# Patient Record
Sex: Female | Born: 1950 | Race: White | Hispanic: No | Smoking: Current every day smoker
Health system: Southern US, Academic
[De-identification: ages and names within clinical notes are randomized; demographics above are authoritative.]

## PROBLEM LIST (undated history)

## (undated) DIAGNOSIS — I509 Heart failure, unspecified: Secondary | ICD-10-CM

## (undated) DIAGNOSIS — S22060A Wedge compression fracture of T7-T8 vertebra, initial encounter for closed fracture: Secondary | ICD-10-CM

## (undated) DIAGNOSIS — K219 Gastro-esophageal reflux disease without esophagitis: Secondary | ICD-10-CM

## (undated) DIAGNOSIS — F329 Major depressive disorder, single episode, unspecified: Secondary | ICD-10-CM

## (undated) DIAGNOSIS — J449 Chronic obstructive pulmonary disease, unspecified: Secondary | ICD-10-CM

## (undated) DIAGNOSIS — I517 Cardiomegaly: Secondary | ICD-10-CM

## (undated) DIAGNOSIS — J9611 Chronic respiratory failure with hypoxia: Secondary | ICD-10-CM

## (undated) DIAGNOSIS — I2541 Coronary artery aneurysm: Secondary | ICD-10-CM

## (undated) DIAGNOSIS — I219 Acute myocardial infarction, unspecified: Secondary | ICD-10-CM

## (undated) DIAGNOSIS — I2510 Atherosclerotic heart disease of native coronary artery without angina pectoris: Secondary | ICD-10-CM

## (undated) HISTORY — DX: Acute myocardial infarction, unspecified: I21.9

## (undated) HISTORY — DX: Gastro-esophageal reflux disease without esophagitis: K21.9

## (undated) HISTORY — PX: NO PAST SURGERIES: SHX2092

---

## 1995-03-31 ENCOUNTER — Other Ambulatory Visit (HOSPITAL_COMMUNITY): Payer: Self-pay

## 2020-08-06 IMAGING — MR MRI JOINT OF LOWER EXTREMITY WITHOUT AND WITH CONTRAST RT
6 of 7 series · 32 of 40 positions shown · IV contrast (gadavist)
Comparison: None available.

﻿EXAM:  MRI JOINT OF LOWER EXTREMITY WITHOUT AND WITH CONTRAST RT
INDICATION: Chronic right hip pain.
TECHNIQUE: Multiplanar multisequential MRI of the right hip joint was performed following intra-articular administration of 15 mL of diluted Gadavist.

[Series 7: T1 · axial · right · 5.0mm · 0.74mm/px · z∈[-76,+83]mm · 6 of 30 slices shown (1 of 2)]
[im 1/30]
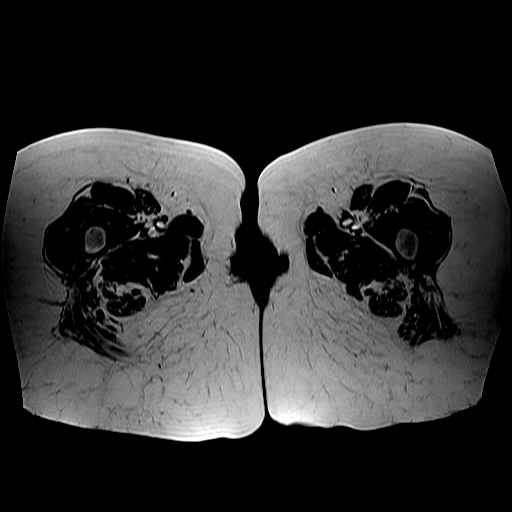
[im 6/30]
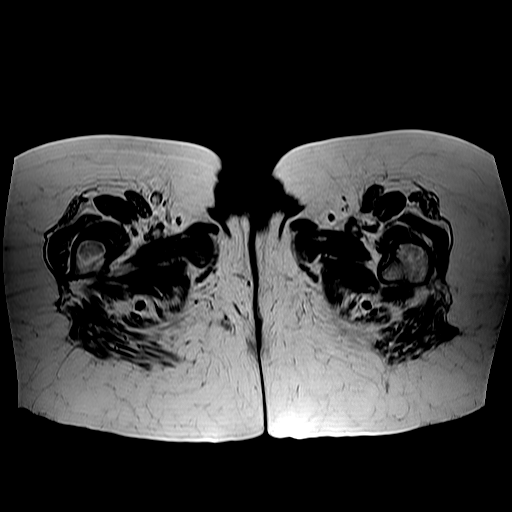
[im 12/30]
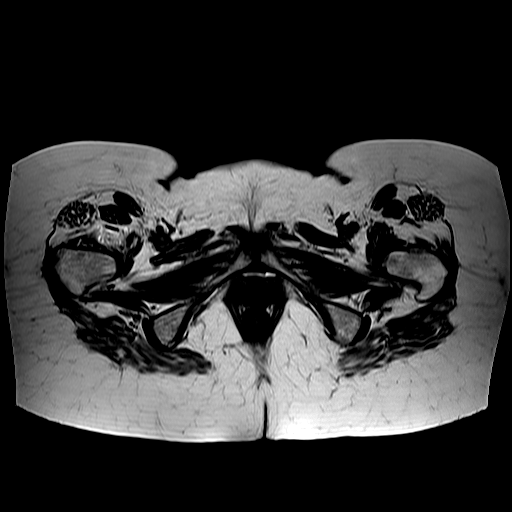
[im 18/30]
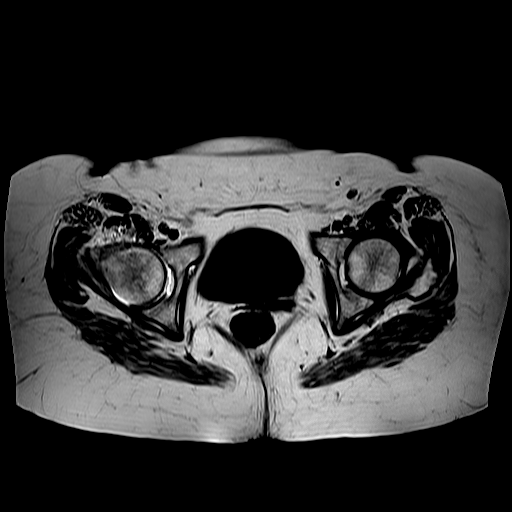
[im 24/30]
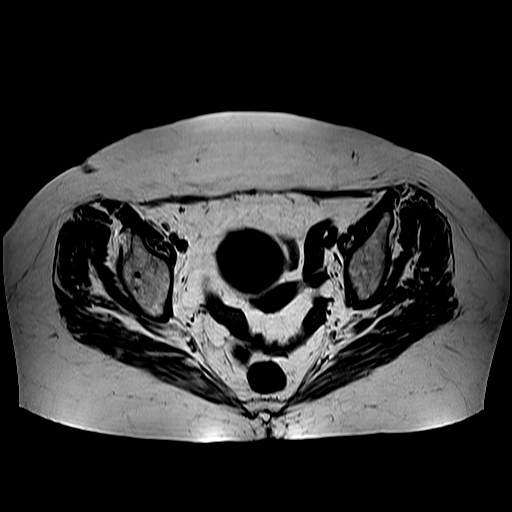
[im 30/30]
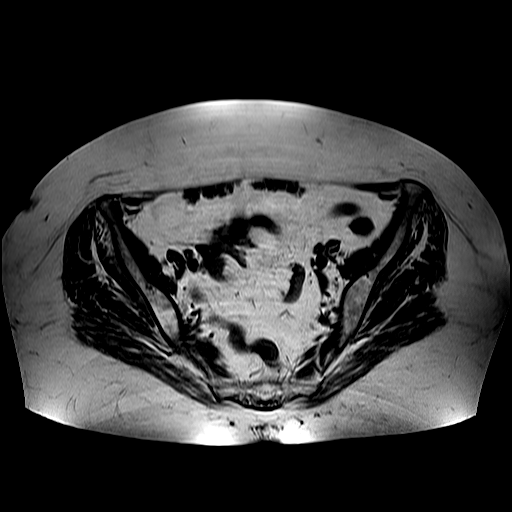

[Series 8: T1 fat-sat · axial · right · 5.0mm · 1.19mm/px · z∈[-76,+83]mm · 7 of 30 slices shown]
[im 1/30]
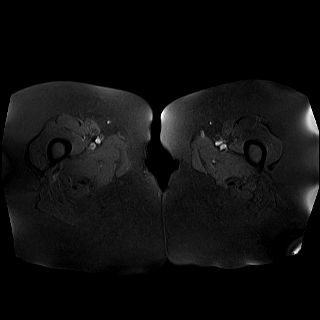
[im 5/30]
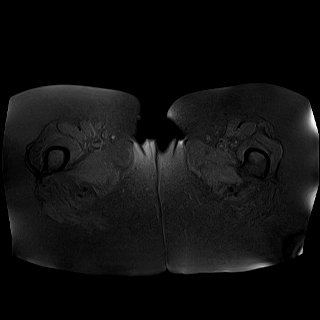
[im 10/30]
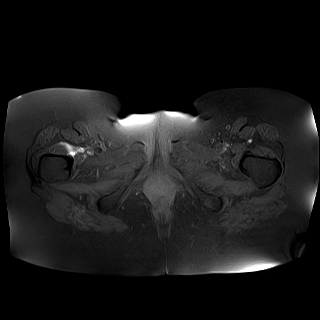
[im 15/30]
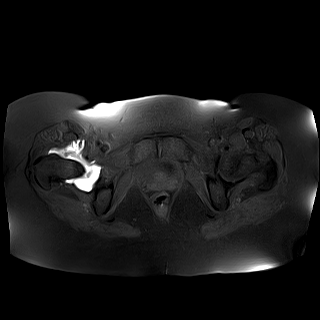
[im 20/30]
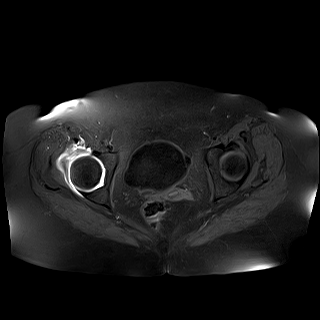
[im 25/30]
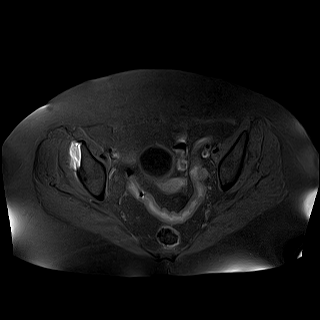
[im 30/30]
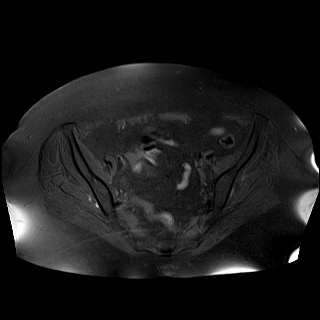

[Series 9: T2 fat-sat · axial · right · 5.0mm · 0.85mm/px · z∈[-76,+83]mm · 7 of 30 slices shown]
[im 1/30]
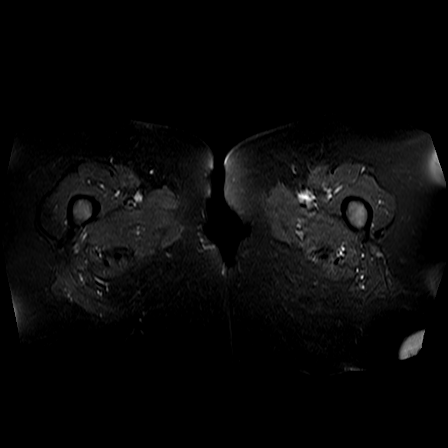
[im 5/30]
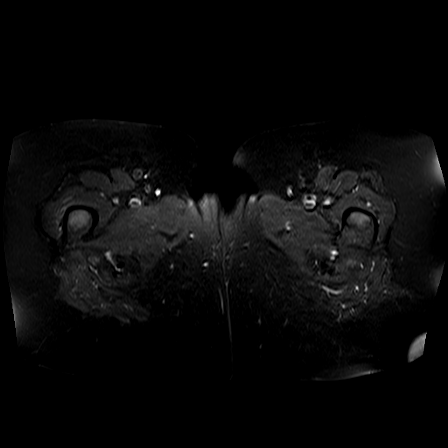
[im 10/30]
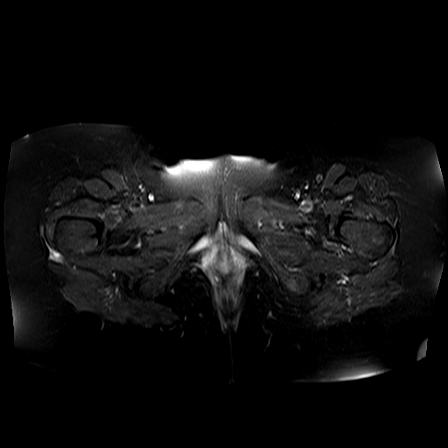
[im 15/30]
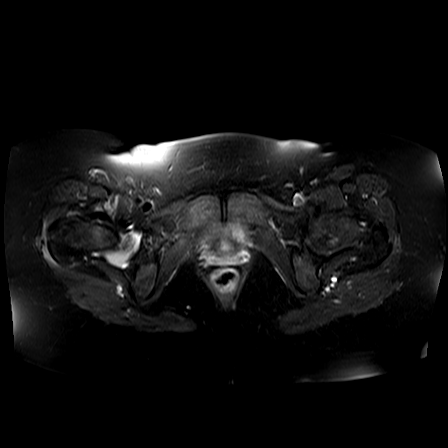
[im 20/30]
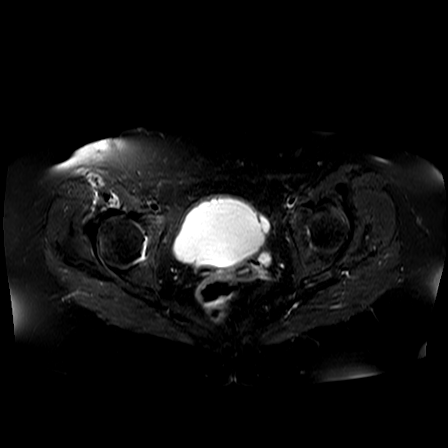
[im 25/30]
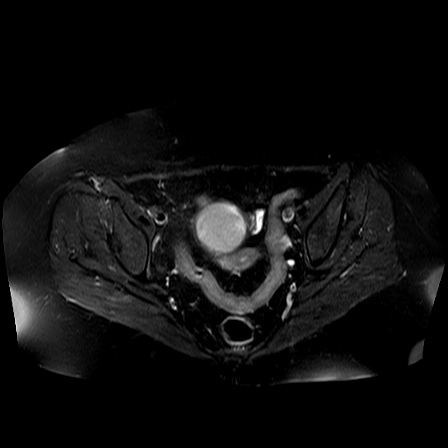
[im 30/30]
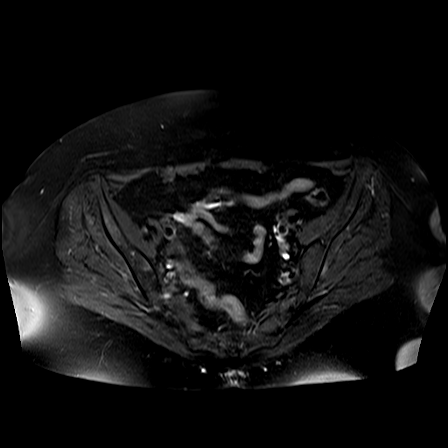

[Series 10: T1 · coronal · right · 4.0mm · 0.74mm/px · 2 of 20 slices shown (2 of 2)]
[im 1/20]
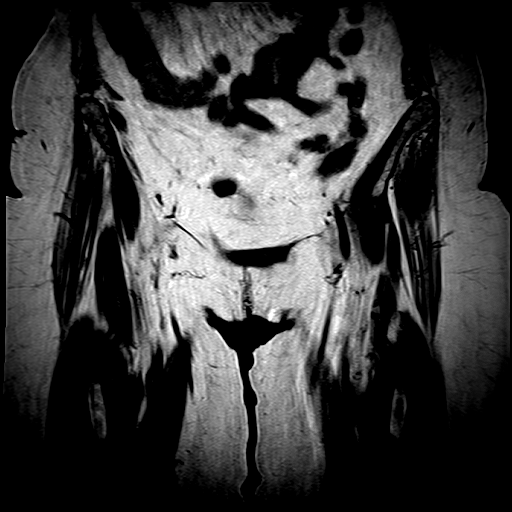
[im 5/20]
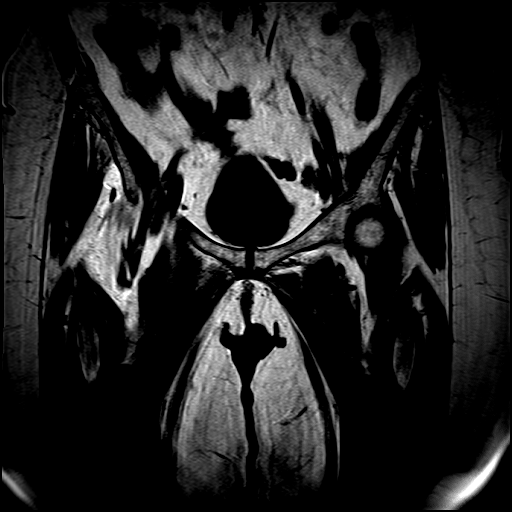

[Series 12: PD fat-sat · axial · right · 4.5mm · 0.70mm/px · z∈[-32,+63]mm · 5 of 20 slices shown (1 of 2)]
[im 1/20]
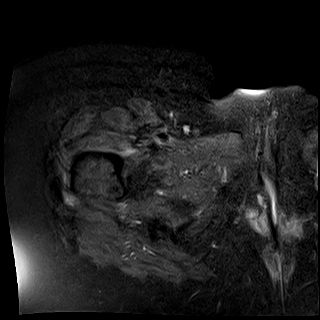
[im 5/20]
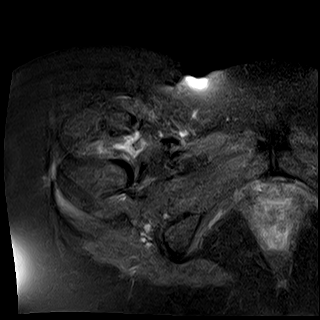
[im 10/20]
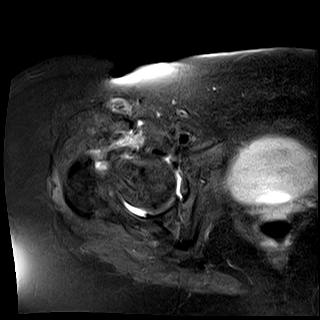
[im 15/20]
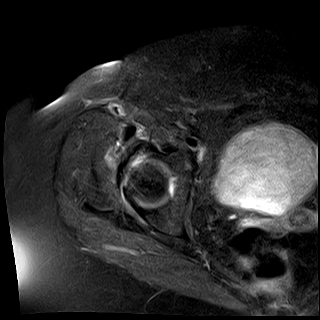
[im 20/20]
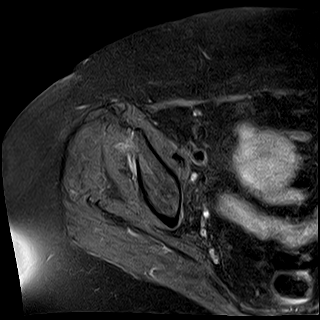

[Series 13: PD fat-sat · coronal · right · 4.0mm · 0.78mm/px · 5 of 20 slices shown (2 of 2)]
[im 1/20]
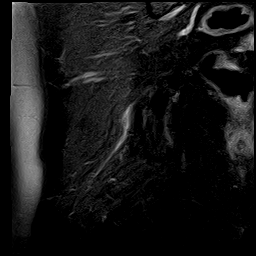
[im 5/20]
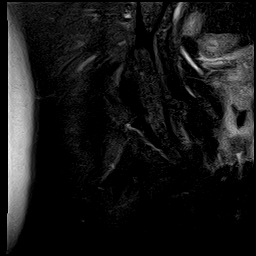
[im 10/20]
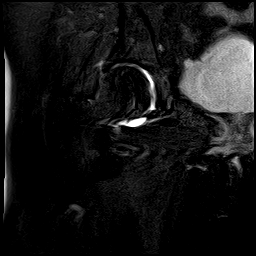
[im 15/20]
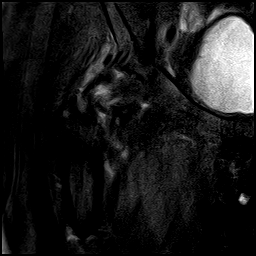
[im 20/20]
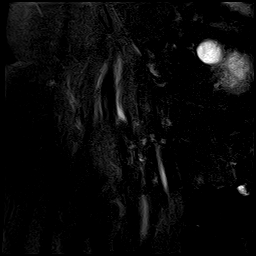

[32 of 40 positions shown; findings below may reference images not displayed]

FINDINGS: Examination is degraded due to excessive patient motion. Bone marrow signal intensity is normal. There is no acute fracture or subluxation. Articular cartilage appears well maintained. No definite labral tear is identified. There is moderate right greater trochanteric bursitis. Muscles and soft tissues about the pelvic girdle appear unremarkable. Evaluation of pelvic parenchymal structures is limited without definite abnormality. Small urinary bladder diverticuli are incidentally noted.
IMPRESSION: 1. Degraded exam due to excessive patient motion. Grossly unremarkable right hip articular cartilage and labrum. 

2. Moderate right greater trochanteric bursitis.

## 2020-12-29 IMAGING — MR MRI LUMBAR SPINE WITHOUT CONTRAST
6 series · 44 of 48 positions shown · IV contrast (gadolinium)
Comparison: None available.

﻿EXAM:  [id number]   MRI LUMBAR SPINE WITHOUT CONTRAST
INDICATION: Back pain.
TECHNIQUE: Multiplanar multisequential MRI of the lumbar spine was performed without gadolinium contrast.

[Series 5: T2 · sagittal · 4.0mm · 1.02mm/px · 5 of 13 slices shown (1 of 3)]
[im 1/13]
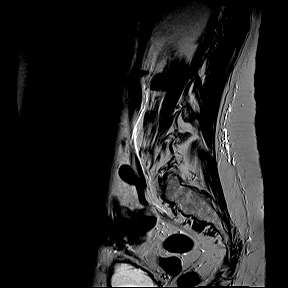
[im 4/13]
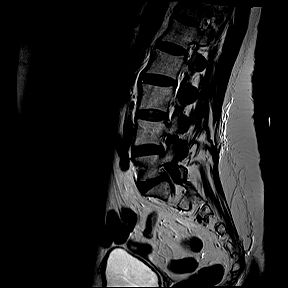
[im 7/13]
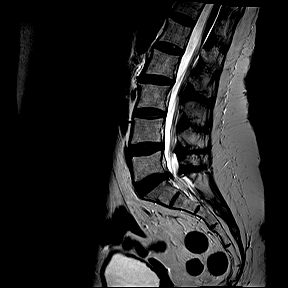
[im 10/13]
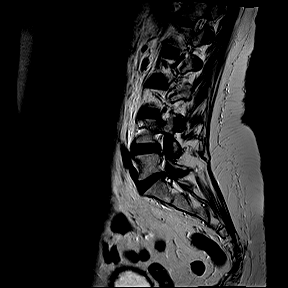
[im 13/13]
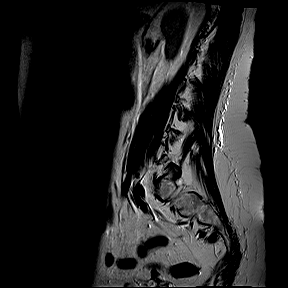

[Series 6: T1 · sagittal · 4.0mm · 1.02mm/px · 6 of 13 slices shown (1 of 2)]
[im 1/13]
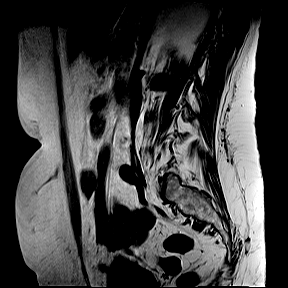
[im 3/13]
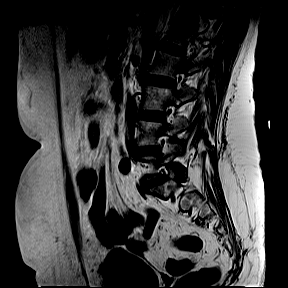
[im 5/13]
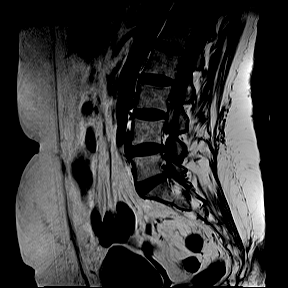
[im 8/13]
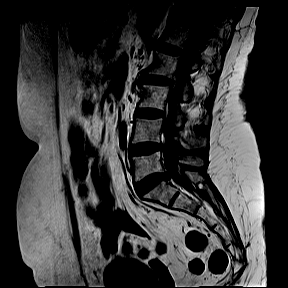
[im 10/13]
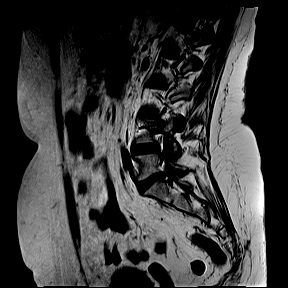
[im 13/13]
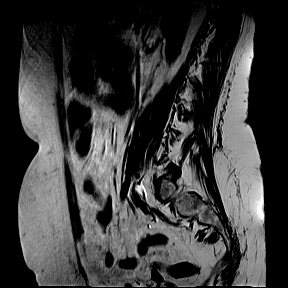

[Series 7: STIR · sagittal · 4.0mm · 1.15mm/px · 5 of 13 slices shown]
[im 1/13]
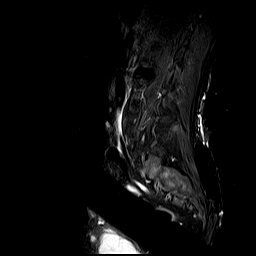
[im 3/13]
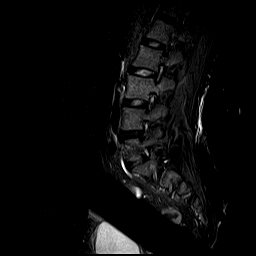
[im 5/13]
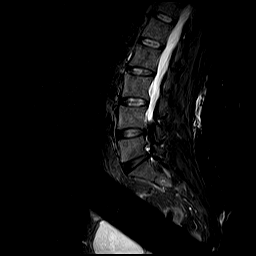
[im 8/13]
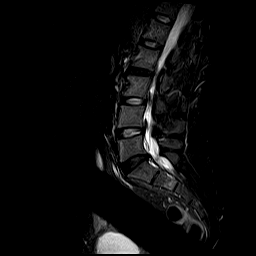
[im 10/13]
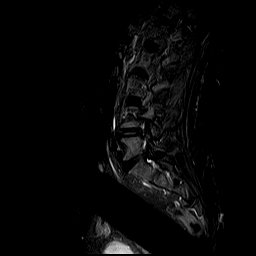

[Series 8: T2 · axial · 4.0mm · 0.47mm/px · z∈[-48,+144]mm · 11 of 23 slices shown (2 of 3)]
[im 1/23]
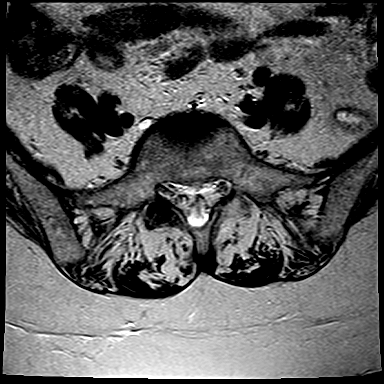
[im 3/23]
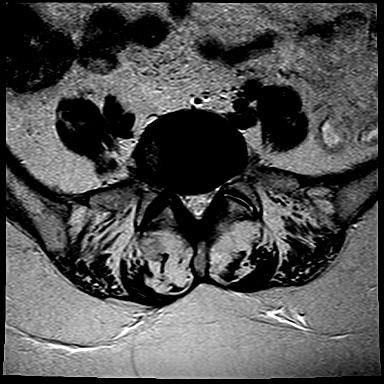
[im 5/23]
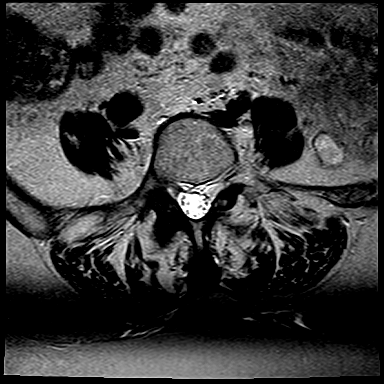
[im 7/23]
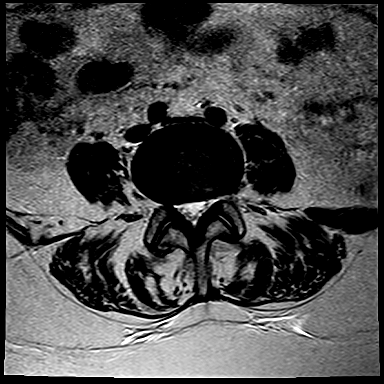
[im 9/23]
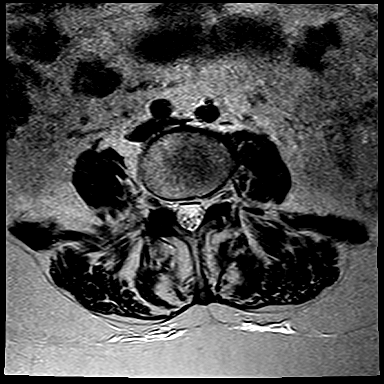
[im 12/23]
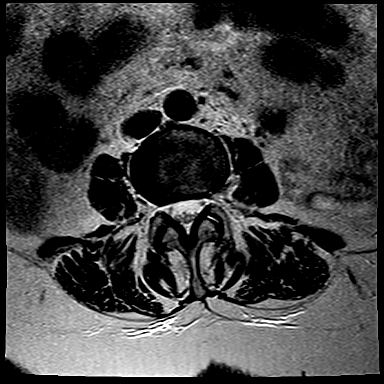
[im 14/23]
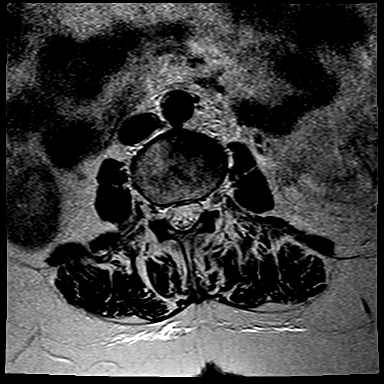
[im 16/23]
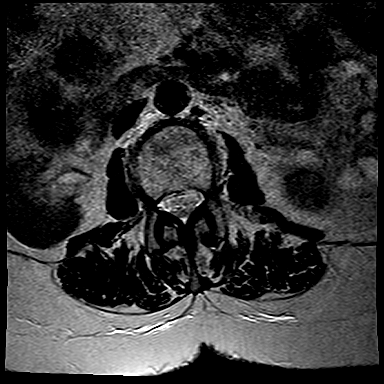
[im 18/23]
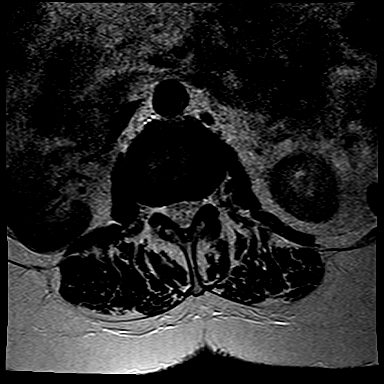
[im 20/23]
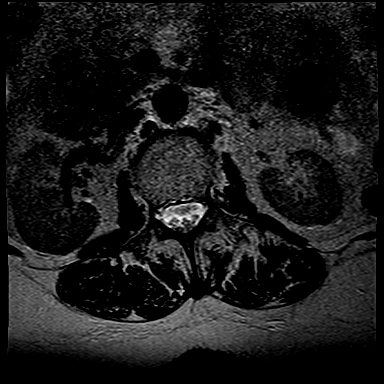
[im 23/23]
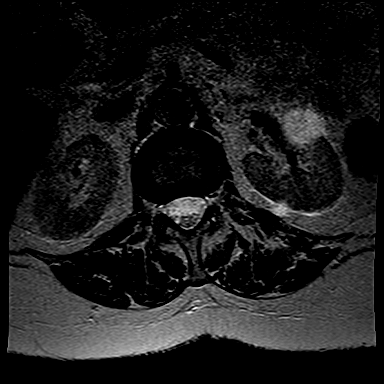

[Series 9: T1 · axial · 4.0mm · 0.47mm/px · z∈[-48,+144]mm · 8 of 23 slices shown (2 of 2)]
[im 1/23]
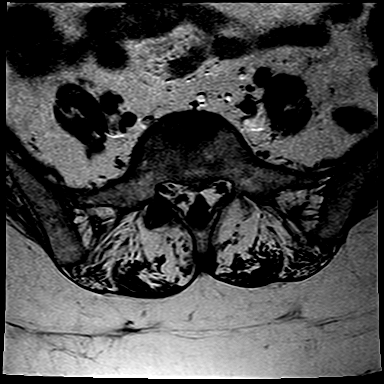
[im 5/23]
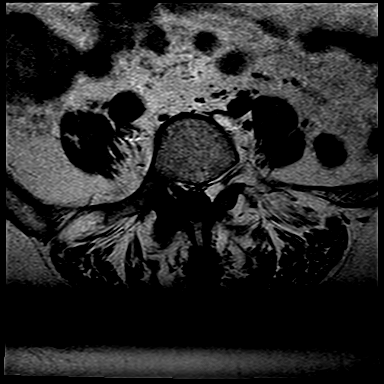
[im 7/23]
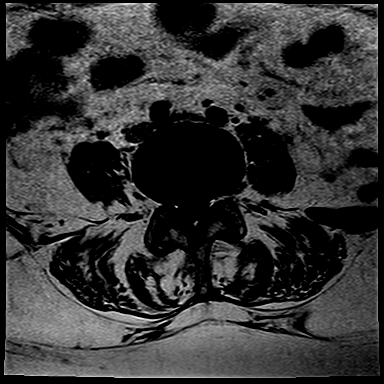
[im 9/23]
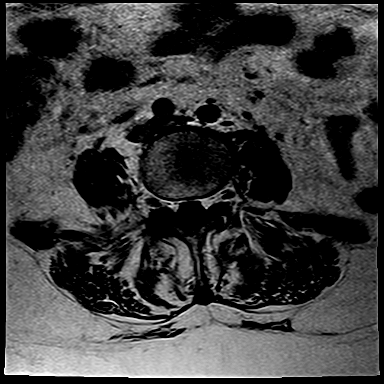
[im 14/23]
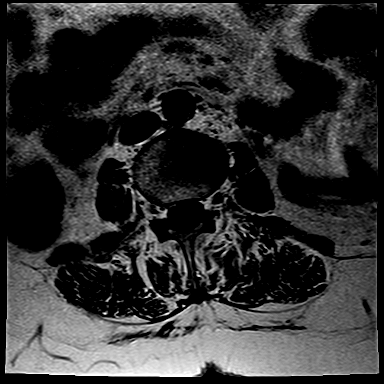
[im 16/23]
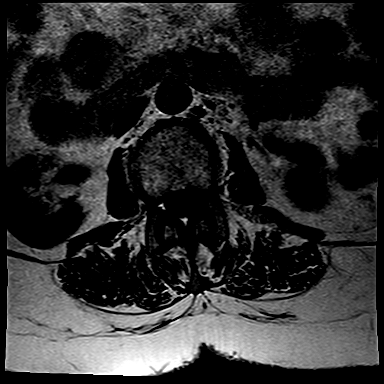
[im 18/23]
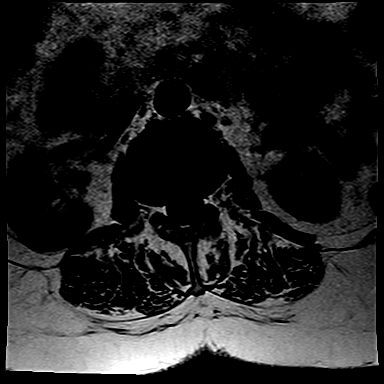
[im 23/23]
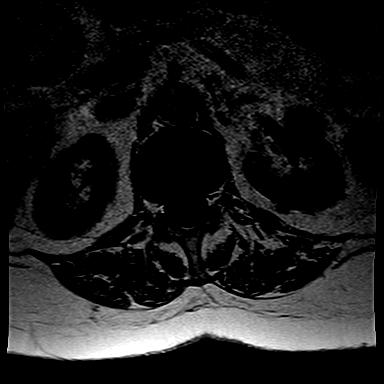

[Series 10: T2 · coronal · 4.0mm · 1.30mm/px · 9 of 20 slices shown (3 of 3)]
[im 1/20]
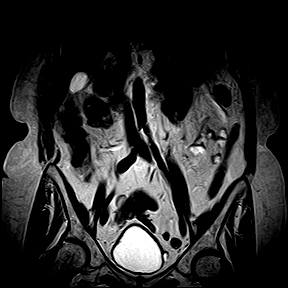
[im 3/20]
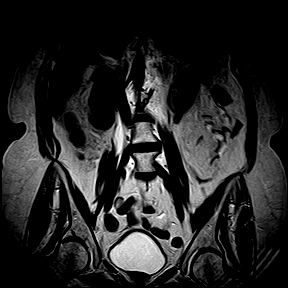
[im 5/20]
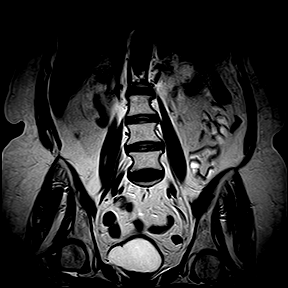
[im 8/20]
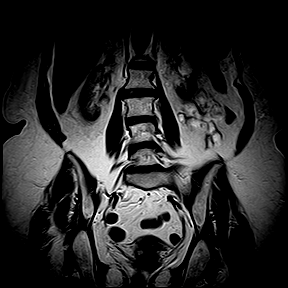
[im 10/20]
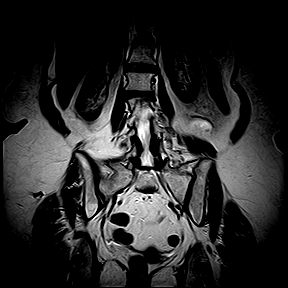
[im 12/20]
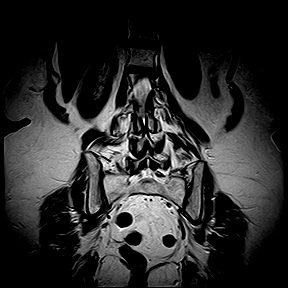
[im 15/20]
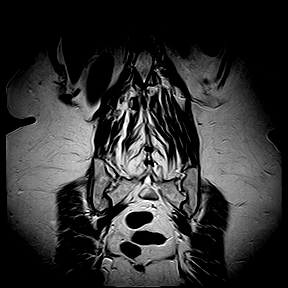
[im 17/20]
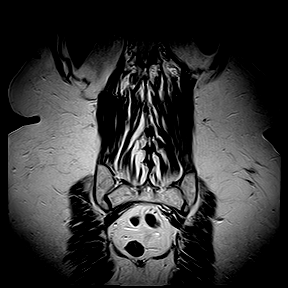
[im 20/20]
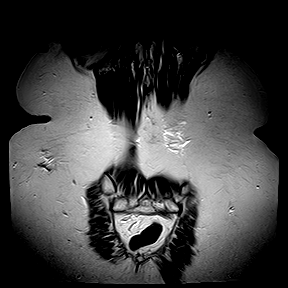

[44 of 48 positions shown; findings below may reference images not displayed]

FINDINGS: Vertebral bodies are normal in height, alignment and signal intensity. There is no acute fracture or subluxation. Distal spinal cord is normal in signal intensity and terminates normally at T12-L1 disc space level. Spinal canal is congenitally narrow.

L1-2, L2-3 and L3-4 levels are unremarkable.

At L4-5 level, there is a small broad-based central disc bulge, mildly effacing the ventral thecal sac. There is mild-to-moderate bilateral neural foraminal stenosis from facet arthropathy and bulging annulus.

At L5-S1 level, there is a small broad-based central disc bulge, mildly effacing the ventral thecal sac. There is moderate to severe right and moderate left neural foraminal stenosis from facet arthropathy and bulging annulus.

Paraspinal soft tissues are unremarkable.
IMPRESSION: 1. No significant disc herniation or spinal stenosis at any level. 

2. Multilevel neural foraminal stenosis as detailed above.

## 2020-12-29 IMAGING — MR MRI THORACIC SPINE WITHOUT CONTRAST
4 of 6 series · 29 of 48 positions shown · IV contrast (gadolinium)
Comparison: None available.

﻿EXAM:  [id number]   MRI THORACIC SPINE WITHOUT CONTRAST
INDICATION: Suspected compression fracture.
TECHNIQUE: Multiplanar multisequential MRI of the thoracic spine was performed without gadolinium contrast.

[Series 5: T2 · sagittal · 4.0mm · 0.78mm/px · 7 of 13 slices shown (1 of 3)]
[im 1/13]
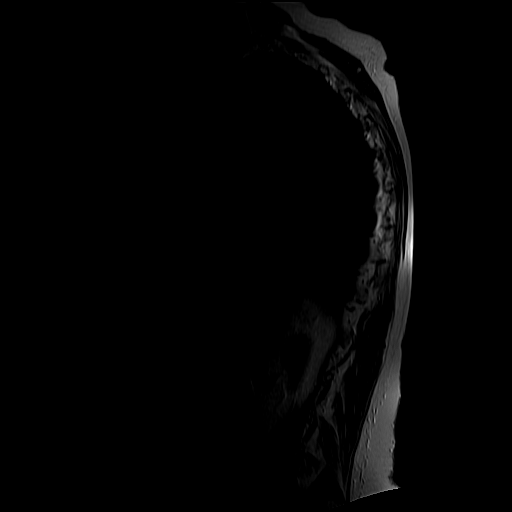
[im 3/13]
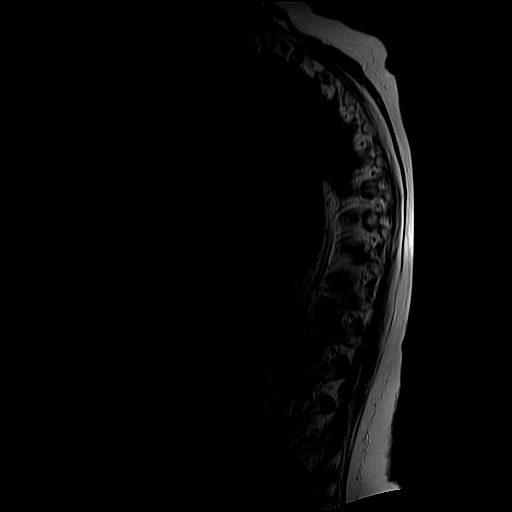
[im 5/13]
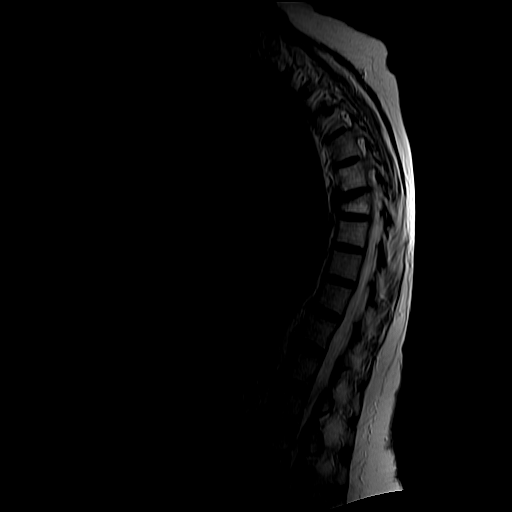
[im 7/13]
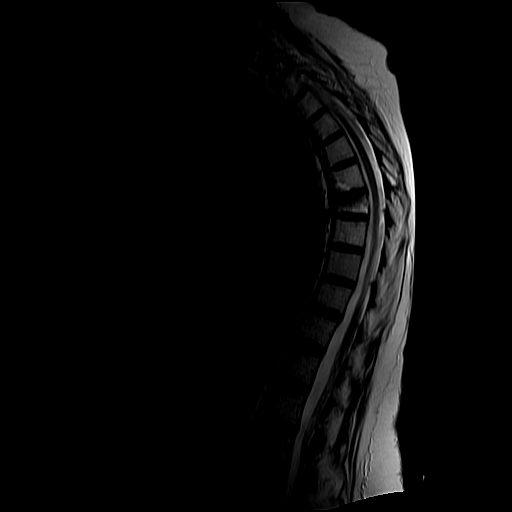
[im 9/13]
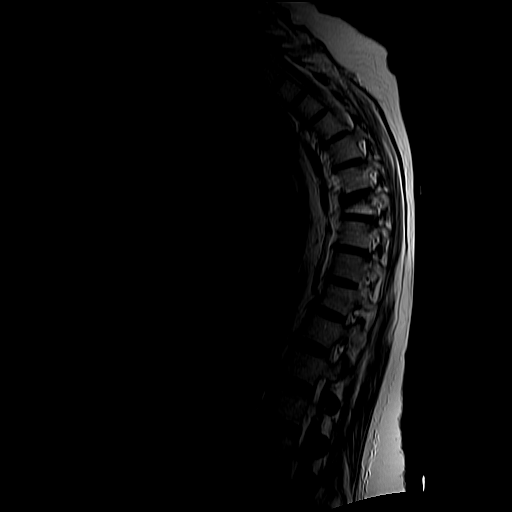
[im 11/13]
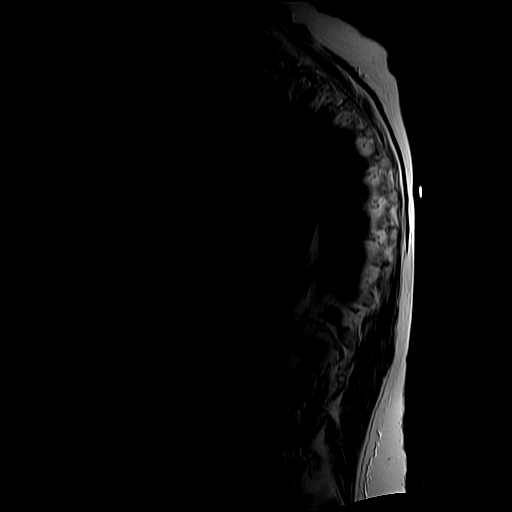
[im 13/13]
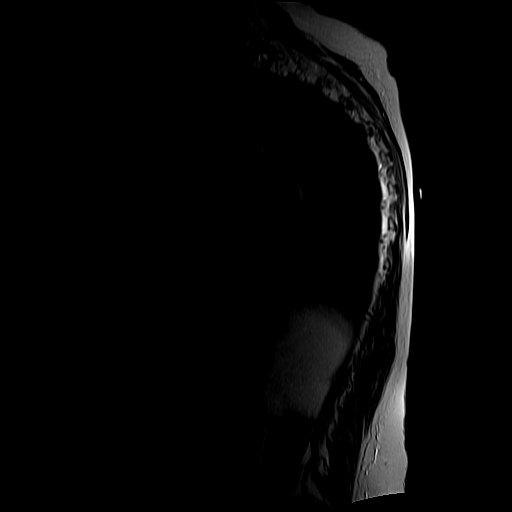

[Series 7: T1 · sagittal · 4.0mm · 0.78mm/px · 5 of 13 slices shown]
[im 1/13]
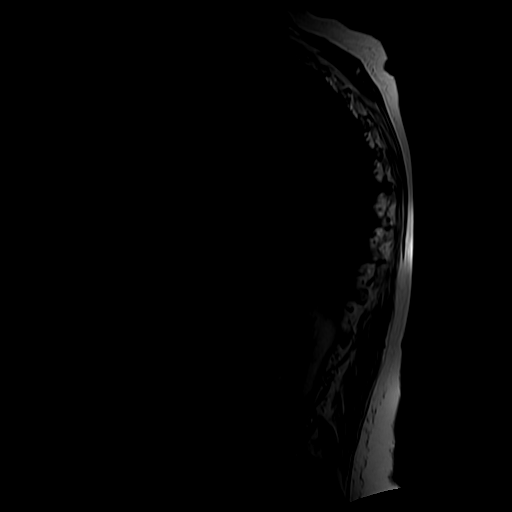
[im 2/13]
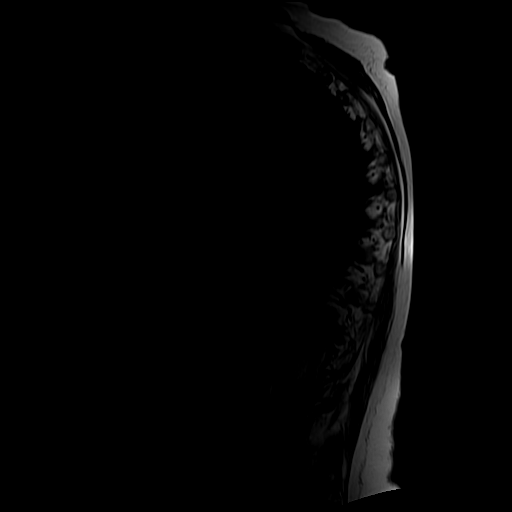
[im 4/13]
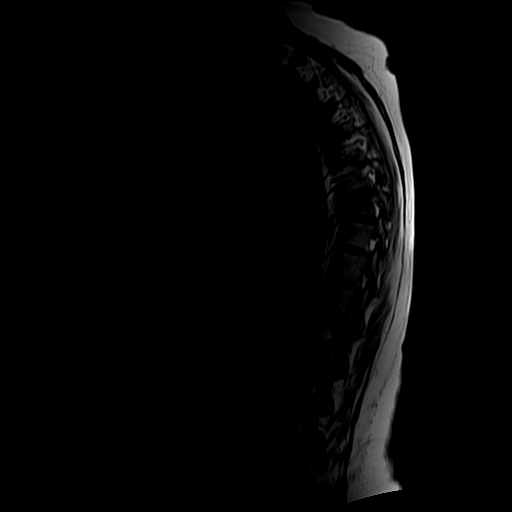
[im 7/13]
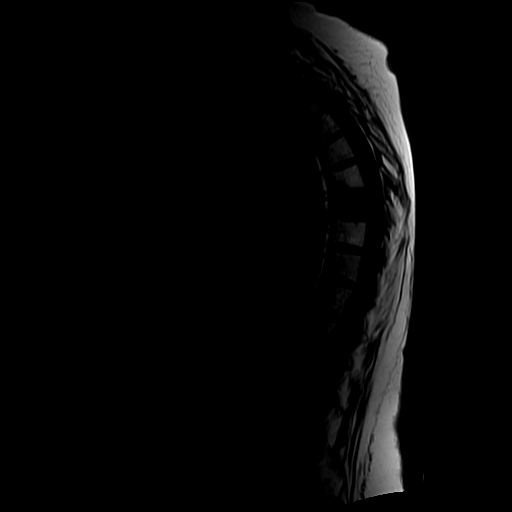
[im 11/13]
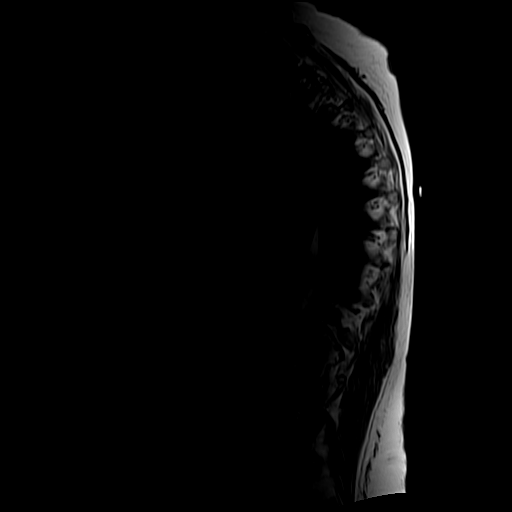

[Series 9: T2 · axial · 4.0mm · 0.62mm/px · z∈[-217,-142]mm · 9 of 16 slices shown (2 of 3)]
[im 1/16]
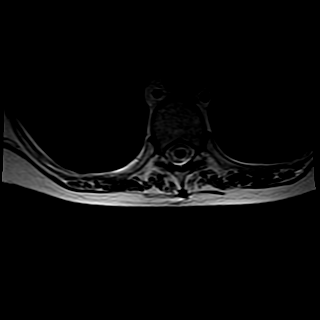
[im 2/16]
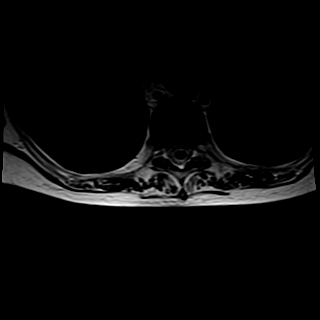
[im 4/16]
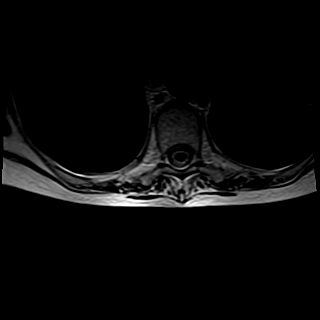
[im 6/16]
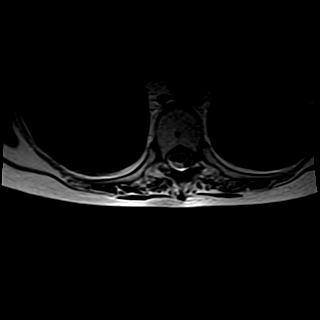
[im 8/16]
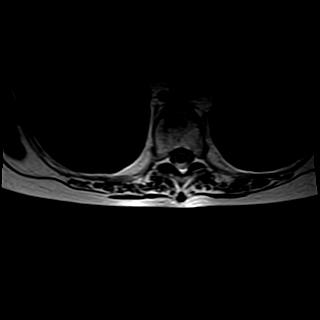
[im 10/16]
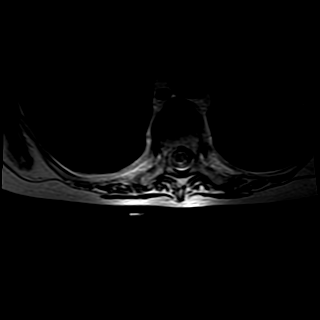
[im 12/16]
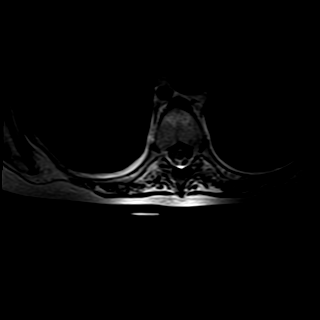
[im 14/16]
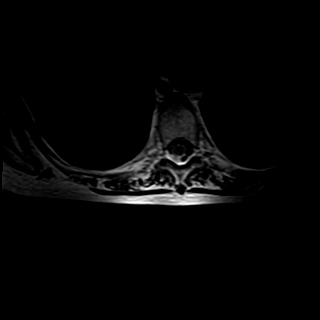
[im 16/16]
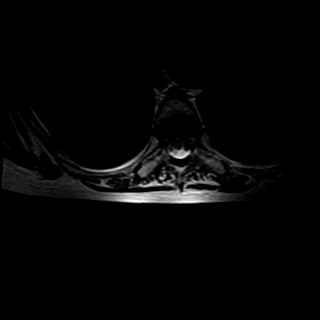

[Series 10: T2 · axial · 4.0mm · 0.62mm/px · z∈[-335,-126]mm · 8 of 13 slices shown (3 of 3)]
[im 1/13]
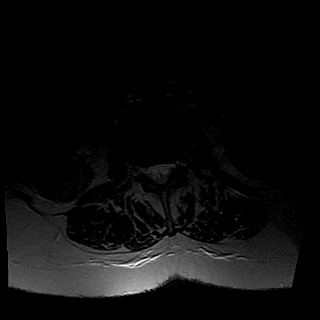
[im 2/13]
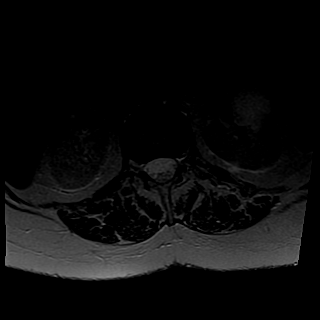
[im 4/13]
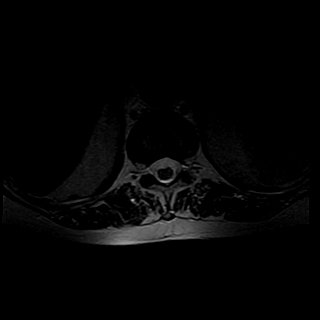
[im 6/13]
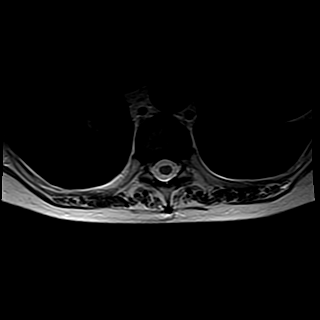
[im 7/13]
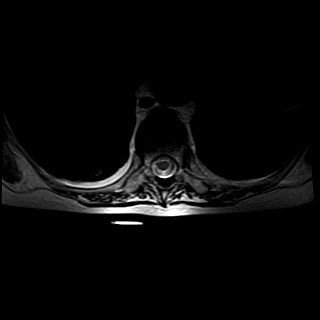
[im 9/13]
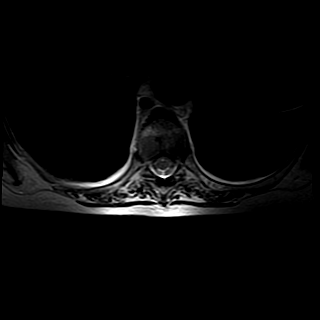
[im 11/13]
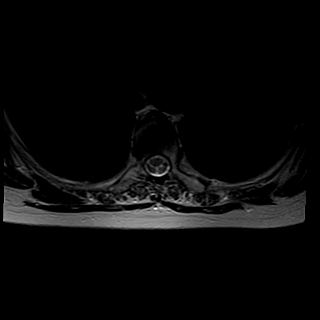
[im 13/13]
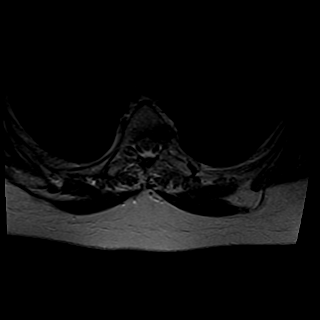

[29 of 48 positions shown; findings below may reference images not displayed]

FINDINGS: There is slight exaggeration of thoracic kyphosis. Mild subacute T8 anterior wedge deformity is noted. Visualized spinal cord is normal in signal intensity without evidence of compression at any level.

There is no significant disc herniation, spinal canal or neural foraminal stenosis at any level.

Paraspinal soft tissues are also unremarkable. There is no pleural effusion.
IMPRESSION: Mild subacute T8 anterior wedge deformity.

## 2021-05-27 ENCOUNTER — Other Ambulatory Visit: Payer: Self-pay

## 2021-05-27 ENCOUNTER — Encounter (HOSPITAL_COMMUNITY): Payer: Self-pay

## 2021-05-28 ENCOUNTER — Encounter (HOSPITAL_COMMUNITY): Payer: Self-pay | Admitting: Student in an Organized Health Care Education/Training Program

## 2021-05-28 ENCOUNTER — Inpatient Hospital Stay
Admission: AD | Admit: 2021-05-28 | Discharge: 2021-06-08 | DRG: 167 | Disposition: A | Discharge status: Rehabilitation Facility | Payer: Medicare Other | Source: Other Acute Inpatient Hospital | Attending: Internal Medicine | Admitting: Internal Medicine

## 2021-05-28 ENCOUNTER — Inpatient Hospital Stay (HOSPITAL_COMMUNITY): Payer: Medicare Other

## 2021-05-28 DIAGNOSIS — I509 Heart failure, unspecified: Secondary | ICD-10-CM

## 2021-05-28 DIAGNOSIS — Z79899 Other long term (current) drug therapy: Secondary | ICD-10-CM

## 2021-05-28 DIAGNOSIS — R339 Retention of urine, unspecified: Secondary | ICD-10-CM | POA: Diagnosis not present

## 2021-05-28 DIAGNOSIS — Z9981 Dependence on supplemental oxygen: Secondary | ICD-10-CM

## 2021-05-28 DIAGNOSIS — I2510 Atherosclerotic heart disease of native coronary artery without angina pectoris: Secondary | ICD-10-CM | POA: Diagnosis present

## 2021-05-28 DIAGNOSIS — Z7982 Long term (current) use of aspirin: Secondary | ICD-10-CM

## 2021-05-28 DIAGNOSIS — K5900 Constipation, unspecified: Secondary | ICD-10-CM | POA: Diagnosis present

## 2021-05-28 DIAGNOSIS — R0600 Dyspnea, unspecified: Secondary | ICD-10-CM

## 2021-05-28 DIAGNOSIS — Z029 Encounter for administrative examinations, unspecified: Secondary | ICD-10-CM

## 2021-05-28 DIAGNOSIS — I5022 Chronic systolic (congestive) heart failure: Secondary | ICD-10-CM | POA: Diagnosis present

## 2021-05-28 DIAGNOSIS — F39 Unspecified mood [affective] disorder: Secondary | ICD-10-CM | POA: Diagnosis present

## 2021-05-28 DIAGNOSIS — J9690 Respiratory failure, unspecified, unspecified whether with hypoxia or hypercapnia: Secondary | ICD-10-CM

## 2021-05-28 DIAGNOSIS — J441 Chronic obstructive pulmonary disease with (acute) exacerbation: Secondary | ICD-10-CM | POA: Diagnosis present

## 2021-05-28 DIAGNOSIS — J9611 Chronic respiratory failure with hypoxia: Secondary | ICD-10-CM | POA: Diagnosis present

## 2021-05-28 DIAGNOSIS — J811 Chronic pulmonary edema: Secondary | ICD-10-CM

## 2021-05-28 DIAGNOSIS — B379 Candidiasis, unspecified: Secondary | ICD-10-CM | POA: Diagnosis present

## 2021-05-28 DIAGNOSIS — J9859 Other diseases of mediastinum, not elsewhere classified: Principal | ICD-10-CM | POA: Diagnosis present

## 2021-05-28 DIAGNOSIS — J9811 Atelectasis: Secondary | ICD-10-CM

## 2021-05-28 DIAGNOSIS — K219 Gastro-esophageal reflux disease without esophagitis: Secondary | ICD-10-CM | POA: Diagnosis present

## 2021-05-28 DIAGNOSIS — Z87891 Personal history of nicotine dependence: Secondary | ICD-10-CM

## 2021-05-28 DIAGNOSIS — I517 Cardiomegaly: Secondary | ICD-10-CM

## 2021-05-28 HISTORY — DX: Major depressive disorder, single episode, unspecified: F32.9

## 2021-05-28 HISTORY — DX: Atherosclerotic heart disease of native coronary artery without angina pectoris: I25.10

## 2021-05-28 HISTORY — DX: Cardiomegaly: I51.7

## 2021-05-28 HISTORY — DX: Chronic obstructive pulmonary disease, unspecified: J44.9

## 2021-05-28 HISTORY — DX: Coronary artery aneurysm: I25.41

## 2021-05-28 HISTORY — DX: Chronic respiratory failure with hypoxia: J96.11

## 2021-05-28 HISTORY — DX: Heart failure, unspecified: I50.9

## 2021-05-28 HISTORY — DX: Wedge compression fracture of T7-T8 vertebra, initial encounter for closed fracture: S22.060A

## 2021-05-28 LAB — TRANSTHORACIC ECHOCARDIOGRAM - ADULT
AV LVOT peak gradient: 5 mmHg
AV mean gradient: 3 mmHg
Ao VTI: 27.6 cm
Ao root annulus: 3.8 cm
Ao root annulus: 3.8 cm
Aortic Valve Area by Continuity of VTI: 2.65 cm2
E wave decelartion time: 240 ms
E/A ratio: 0.7
E/E' ratio: 11.3
LA Volume Index: 33 ml/m2
LA volume: 51.2 cm3
LVOT diameter: 1.9 cm
LVOT stroke volume: 73 cm3
Left Ventricular Outflow Tract Peak Velocity: 109 cm/s
MV Peak A Vel: 92.4 cm/s
MV Peak E Vel: 64 cm/s
MV stenosis pressure 1/2 time: 70 ms
Pulmonic Valve Acceleration Time: 95 ms
TDI: 5.66 cm/s
TDI: 5.66 cm/s

## 2021-05-28 LAB — CBC WITH DIFF
BASOPHIL #: <0.1 10*3/uL (ref <=0.20)
BASOPHIL %: 0 %
EOSINOPHIL #: <0.1 10*3/uL (ref <=0.50)
EOSINOPHIL %: 0 %
HCT: 45.4 % (ref 34.8–46.0)
HGB: 13.8 g/dL (ref 11.5–16.0)
IMMATURE GRANULOCYTE #: <0.1 10*3/uL (ref <0.10)
IMMATURE GRANULOCYTE %: 1 % (ref 0–1)
LYMPHOCYTE #: 0.59 10*3/uL — ABNORMAL LOW (ref 1.00–4.80)
LYMPHOCYTE %: 4 %
MCH: 25.7 pg — ABNORMAL LOW (ref 26.0–32.0)
MCHC: 30.4 g/dL — ABNORMAL LOW (ref 31.0–35.5)
MCV: 84.4 fL (ref 78.0–100.0)
MONOCYTE #: 1.44 10*3/uL — ABNORMAL HIGH (ref 0.20–1.10)
MONOCYTE %: 11 %
MPV: 11.7 fL (ref 8.7–12.5)
NEUTROPHIL #: 11.24 10*3/uL — ABNORMAL HIGH (ref 1.50–7.70)
NEUTROPHIL %: 84 %
PLATELETS: 176 10*3/uL (ref 150–400)
RBC: 5.38 10*6/uL — ABNORMAL HIGH (ref 3.85–5.22)
RDW-CV: 17.2 % — ABNORMAL HIGH (ref 11.5–15.5)
WBC: 13.4 10*3/uL — ABNORMAL HIGH (ref 3.7–11.0)

## 2021-05-28 LAB — PHOSPHORUS: PHOSPHORUS: 4.9 mg/dL — ABNORMAL HIGH (ref 2.3–4.0)

## 2021-05-28 LAB — TROPONIN-I: TROPONIN I: 18 ng/L (ref 0–30)

## 2021-05-28 LAB — BASIC METABOLIC PANEL
ANION GAP: 10 mmol/L (ref 4–13)
BUN/CREA RATIO: 32 — ABNORMAL HIGH (ref 6–22)
BUN: 32 mg/dL — ABNORMAL HIGH (ref 8–25)
CALCIUM: 9.6 mg/dL (ref 8.8–10.2)
CHLORIDE: 92 mmol/L — ABNORMAL LOW (ref 96–111)
CO2 TOTAL: 36 mmol/L — ABNORMAL HIGH (ref 23–31)
CREATININE: 0.99 mg/dL (ref 0.60–1.05)
ESTIMATED GFR: 61 mL/min/BSA (ref >=60)
GLUCOSE: 116 mg/dL (ref 65–125)
POTASSIUM: 3.4 mmol/L — ABNORMAL LOW (ref 3.5–5.1)
SODIUM: 138 mmol/L (ref 136–145)

## 2021-05-28 LAB — HEPATIC FUNCTION PANEL
ALBUMIN: 3.4 g/dL (ref 3.4–4.8)
ALKALINE PHOSPHATASE: 73 U/L (ref 55–145)
ALT (SGPT): 18 U/L (ref 8–22)
AST (SGOT): 20 U/L (ref 8–45)
BILIRUBIN DIRECT: 0.4 mg/dL (ref 0.1–0.4)
BILIRUBIN TOTAL: 0.9 mg/dL (ref 0.3–1.3)
PROTEIN TOTAL: 6.3 g/dL (ref 6.0–8.0)

## 2021-05-28 LAB — PT/INR
INR: 0.88 (ref 0.80–1.20)
PROTHROMBIN TIME: 10.1 seconds (ref 9.1–13.9)

## 2021-05-28 LAB — B-TYPE NATRIURETIC PEPTIDE: BNP: 297 pg/mL — ABNORMAL HIGH (ref <=100)

## 2021-05-28 LAB — HCG, PLASMA OR SERUM QUANTITATIVE, TUMOR MARKER: HCG QUANTITATIVE TUMOR MARKER: 10 IU/L

## 2021-05-28 LAB — PROCALCITONIN ALGORITHM: PROCALCITONIN: 0.03 ng/mL (ref <0.50)

## 2021-05-28 LAB — LDH: LDH: 229 U/L — ABNORMAL HIGH (ref 125–220)

## 2021-05-28 LAB — MAGNESIUM: MAGNESIUM: 2.5 mg/dL (ref 1.8–2.6)

## 2021-05-28 LAB — THYROID STIMULATING HORMONE WITH FREE T4 REFLEX: TSH: 2.493 u[IU]/mL (ref 0.430–3.550)

## 2021-05-28 LAB — C-REACTIVE PROTEIN(CRP),INFLAMMATION: CRP INFLAMMATION: <0.4 mg/L (ref <8.0)

## 2021-05-28 MED ORDER — ALBUTEROL SULFATE 2.5 MG/3 ML (0.083 %) SOLUTION FOR NEBULIZATION
2.5000 mg | INHALATION_SOLUTION | RESPIRATORY_TRACT | Status: DC | PRN
Start: 2021-05-28 — End: 2021-06-08
  Administered 2021-06-03 – 2021-06-04 (×2): 2.5 mg via RESPIRATORY_TRACT

## 2021-05-28 MED ORDER — IPRATROPIUM 0.5 MG-ALBUTEROL 3 MG (2.5 MG BASE)/3 ML NEBULIZATION SOLN
3.0000 mL | INHALATION_SOLUTION | Freq: Four times a day (QID) | RESPIRATORY_TRACT | Status: DC
Start: 2021-05-28 — End: 2021-05-31
  Administered 2021-05-28: 21:00:00 3 mL via RESPIRATORY_TRACT
  Administered 2021-05-28: 08:00:00 0 mL via RESPIRATORY_TRACT
  Administered 2021-05-28 (×2): 3 mL via RESPIRATORY_TRACT
  Administered 2021-05-29: 16:00:00 0 mL via RESPIRATORY_TRACT
  Administered 2021-05-29 (×3): 3 mL via RESPIRATORY_TRACT
  Administered 2021-05-30: 08:00:00 0 mL via RESPIRATORY_TRACT
  Administered 2021-05-30 – 2021-05-31 (×4): 3 mL via RESPIRATORY_TRACT
  Filled 2021-05-28 (×2): qty 3

## 2021-05-28 MED ORDER — BUDESONIDE-FORMOTEROL HFA 160 MCG-4.5 MCG/ACTUATION AEROSOL INHALER
INHALATION_SPRAY | RESPIRATORY_TRACT | Status: AC
Start: 2021-05-28 — End: 2021-05-28
  Filled 2021-05-28: qty 6

## 2021-05-28 MED ORDER — SODIUM CHLORIDE 0.9% FLUSH BAG - 250 ML
INTRAVENOUS | Status: DC | PRN
Start: 2021-05-28 — End: 2021-06-08

## 2021-05-28 MED ORDER — ESCITALOPRAM 20 MG TABLET
20.0000 mg | ORAL_TABLET | Freq: Every day | ORAL | Status: DC
Start: 2021-05-28 — End: 2021-06-08
  Administered 2021-05-28 – 2021-06-08 (×12): 20 mg via ORAL
  Filled 2021-05-28 (×13): qty 1

## 2021-05-28 MED ORDER — DOXYCYCLINE MONOHYDRATE 100 MG TABLET
100.0000 mg | ORAL_TABLET | Freq: Two times a day (BID) | ORAL | Status: AC
Start: 2021-05-28 — End: 2021-06-01
  Administered 2021-05-28 – 2021-06-01 (×10): 100 mg via ORAL
  Filled 2021-05-28 (×10): qty 1

## 2021-05-28 MED ORDER — BUDESONIDE-FORMOTEROL HFA 160 MCG-4.5 MCG/ACTUATION AEROSOL INHALER
4.0000 | INHALATION_SPRAY | Freq: Two times a day (BID) | RESPIRATORY_TRACT | Status: DC
Start: 2021-05-28 — End: 2021-05-31
  Administered 2021-05-28: 21:00:00 4 via RESPIRATORY_TRACT
  Administered 2021-05-28: 08:00:00 0 via RESPIRATORY_TRACT
  Administered 2021-05-29 (×2): 4 via RESPIRATORY_TRACT
  Administered 2021-05-30: 08:00:00 0 via RESPIRATORY_TRACT
  Administered 2021-05-30 – 2021-05-31 (×2): 4 via RESPIRATORY_TRACT

## 2021-05-28 MED ORDER — ASPIRIN 81 MG CHEWABLE TABLET
81.0000 mg | CHEWABLE_TABLET | Freq: Every day | ORAL | Status: DC
Start: 2021-05-28 — End: 2021-06-08
  Administered 2021-05-28 – 2021-05-30 (×3): 81 mg via ORAL
  Administered 2021-05-31 – 2021-06-03 (×4): 0 mg via ORAL
  Administered 2021-06-04 – 2021-06-08 (×5): 81 mg via ORAL
  Filled 2021-05-28 (×8): qty 1

## 2021-05-28 MED ORDER — CALCIUM 200 MG (AS CALCIUM CARBONATE 500 MG) CHEWABLE TABLET
500.0000 mg | CHEWABLE_TABLET | Freq: Three times a day (TID) | ORAL | Status: DC | PRN
Start: 2021-05-28 — End: 2021-06-08
  Filled 2021-05-28: qty 1

## 2021-05-28 MED ORDER — POTASSIUM BICARBONATE-CITRIC ACID 20 MEQ EFFERVESCENT TABLET
40.0000 meq | EFFERVESCENT_TABLET | Freq: Once | ORAL | Status: AC
Start: 2021-05-28 — End: 2021-05-28
  Administered 2021-05-28: 17:00:00 40 meq via ORAL
  Filled 2021-05-28: qty 2

## 2021-05-28 MED ORDER — LORATADINE 10 MG TABLET
10.0000 mg | ORAL_TABLET | Freq: Every day | ORAL | Status: DC
Start: 2021-05-29 — End: 2021-06-08
  Administered 2021-05-29 – 2021-06-08 (×11): 10 mg via ORAL
  Filled 2021-05-28 (×12): qty 1

## 2021-05-28 MED ORDER — ENOXAPARIN 40 MG/0.4 ML SUBCUTANEOUS SYRINGE
40.0000 mg | INJECTION | Freq: Every day | SUBCUTANEOUS | Status: DC
Start: 2021-05-28 — End: 2021-06-08
  Administered 2021-05-28 – 2021-05-31 (×4): 40 mg via SUBCUTANEOUS
  Administered 2021-06-01 – 2021-06-03 (×3): 0 mg via SUBCUTANEOUS
  Administered 2021-06-04 – 2021-06-08 (×5): 40 mg via SUBCUTANEOUS
  Filled 2021-05-28 (×9): qty 0.4

## 2021-05-28 MED ORDER — DEXTROSE 5% IN WATER (D5W) FLUSH BAG - 250 ML
INTRAVENOUS | Status: DC | PRN
Start: 2021-05-28 — End: 2021-06-08

## 2021-05-28 MED ORDER — SODIUM CHLORIDE 0.9 % INJECTION SOLUTION
2.0000 mL | INTRAVENOUS | Status: AC
Start: 2021-05-28 — End: 2021-05-28
  Administered 2021-05-28: 08:00:00 2 mL via INTRAVENOUS

## 2021-05-28 MED ORDER — FAMOTIDINE 20 MG TABLET
20.0000 mg | ORAL_TABLET | Freq: Every day | ORAL | Status: DC
Start: 2021-05-28 — End: 2021-05-28
  Administered 2021-05-28: 11:00:00 20 mg via ORAL
  Filled 2021-05-28: qty 1

## 2021-05-28 MED ORDER — ONDANSETRON HCL (PF) 4 MG/2 ML INJECTION SOLUTION
4.0000 mg | Freq: Four times a day (QID) | INTRAMUSCULAR | Status: DC | PRN
Start: 2021-05-28 — End: 2021-06-08
  Administered 2021-05-28: 11:00:00 4 mg via INTRAVENOUS
  Filled 2021-05-28 (×2): qty 2

## 2021-05-28 MED ORDER — AEROCHAMBER W/ FLOWSIGNAL SPACER
Status: AC
Start: 2021-05-28 — End: 2021-05-28
  Filled 2021-05-28: qty 1

## 2021-05-28 MED ORDER — SODIUM CHLORIDE 0.9 % (FLUSH) INJECTION SYRINGE
2.0000 mL | INJECTION | Freq: Three times a day (TID) | INTRAMUSCULAR | Status: DC
Start: 2021-05-28 — End: 2021-06-08
  Administered 2021-05-28: 20:00:00 6 mL
  Administered 2021-05-28 – 2021-05-29 (×4): 0 mL
  Administered 2021-05-29: 14:00:00 5 mL
  Administered 2021-05-30: 20:00:00 2 mL
  Administered 2021-05-30 (×2): 0 mL
  Administered 2021-05-31: 22:00:00 2 mL
  Administered 2021-05-31 – 2021-06-01 (×4): 0 mL
  Administered 2021-06-01: 06:00:00 2 mL
  Administered 2021-06-02 – 2021-06-05 (×11): 0 mL
  Administered 2021-06-05: 12:00:00 6 mL
  Administered 2021-06-06 (×3): 0 mL
  Administered 2021-06-07: 22:00:00 2 mL
  Administered 2021-06-07 (×2): 0 mL
  Administered 2021-06-08: 06:00:00 5 mL

## 2021-05-28 MED ORDER — ACETAMINOPHEN 325 MG TABLET
650.0000 mg | ORAL_TABLET | ORAL | Status: DC | PRN
Start: 2021-05-28 — End: 2021-06-08
  Administered 2021-05-28: 17:00:00 650 mg via ORAL
  Filled 2021-05-28: qty 2

## 2021-05-28 MED ORDER — SODIUM CHLORIDE 0.9 % (FLUSH) INJECTION SYRINGE
2.0000 mL | INJECTION | INTRAMUSCULAR | Status: DC | PRN
Start: 2021-05-28 — End: 2021-06-08

## 2021-05-28 MED ORDER — ATORVASTATIN 40 MG TABLET
40.0000 mg | ORAL_TABLET | Freq: Every evening | ORAL | Status: DC
Start: 2021-05-28 — End: 2021-06-08
  Administered 2021-05-28: 07:00:00 0 mg via ORAL
  Administered 2021-05-28 – 2021-06-07 (×11): 40 mg via ORAL
  Filled 2021-05-28 (×12): qty 1

## 2021-05-28 MED ORDER — PREDNISONE 20 MG TABLET
40.0000 mg | ORAL_TABLET | Freq: Every morning | ORAL | Status: AC
Start: 2021-05-28 — End: 2021-06-01
  Administered 2021-05-28 – 2021-06-01 (×5): 40 mg via ORAL
  Filled 2021-05-28 (×5): qty 2

## 2021-05-28 MED ORDER — PANTOPRAZOLE 20 MG TABLET,DELAYED RELEASE
20.0000 mg | DELAYED_RELEASE_TABLET | Freq: Every morning | ORAL | Status: DC
Start: 2021-05-29 — End: 2021-06-08
  Administered 2021-05-29 – 2021-06-08 (×11): 20 mg via ORAL
  Filled 2021-05-28 (×11): qty 1

## 2021-05-28 NOTE — H&P (Signed)
[facility]  General Medicine  Admission H&P    Date of Service:  05/28/2021  Misty Johnson, Misty Johnson, 70 y.o. female  Date of Admission:  05/28/2021  Date of Birth:  03-02-1951  PCP: [name], FNP-C          Information Obtained from: patient and history reviewed via medical record  Chief Complaint:  SOB, hoarseness    HPI: Misty Johnson is a 70 y.o., White female with medical history of CAD, HFrEF, COPD and chronic hypoxic respiratory failure on 3L NC at home who presented to [facility] due to having SOB and hoarseness for the last few days. She endorse having increased wheezing, sputum production and cough. She also has hoarseness that compromised her ability to vocalize normally. At the outside hospital, she was found to have COPD exacerbation and started on treatment for it. They performed a CT scan of chest which showed a mediastinal mass. This prompted patient to be transferred to [facility] for further evaluation.    Patient endorses having nausea, sob, sputum production, wheezing and hoarseness. Denies having chest pain, vomiting, abdominal pain, diarrhea or dysuria.      PAST MEDICAL:    Past Medical History:   Diagnosis Date   . CAD (coronary artery disease)    . Chronic hypoxemic respiratory failure (CMS HCC)     3L NC at home   . Compression fracture of T8 vertebra (CMS HCC)    . Congestive heart failure (CMS HCC)    . COPD (chronic obstructive pulmonary disease) (CMS HCC)    . COPD (chronic obstructive pulmonary disease) (CMS HCC)    . 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 Enlarged heart    . LVH (left ventricular hypertrophy)    . Major depression         Past Surgical History:   Procedure Laterality Date   . NO PAST SURGERIES              Medications Prior to Admission     Prescriptions    albuterol sulfate (PROVENTIL OR VENTOLIN OR PROAIR) 90 mcg/actuation Inhalation oral inhaler    Take 1-2 Puffs by inhalation Every 6 hours as needed    aspirin 81 mg Oral Tablet,  Chewable    Chew 1 Tablet (81 mg total) Once a day    atorvastatin (LIPITOR) 40 mg Oral Tablet    Take 1 Tablet (40 mg total) by mouth Every evening    escitalopram oxalate (LEXAPRO) 20 mg Oral Tablet    Take 1 Tablet (20 mg total) by mouth Once a day    Levocetirizine (XYZAL) 5 mg Oral Tablet    Take 1 Tablet (5 mg total) by mouth Once a day    meloxicam (MOBIC) 15 mg Oral Tablet    Take 1 Tablet (15 mg total) by mouth Once a day    omeprazole (PRILOSEC) 20 mg Oral Capsule, Delayed Release(E.C.)    Take 1 Capsule (20 mg total) by mouth Once a day    OXYGEN-AIR DELIVERY SYSTEMS DEACT    3L NC at home        No Known Allergies      Family History  Family Medical History:     Problem Relation (Age of Onset)    No Known Problems Mother, Father          Social History  Social History     Socioeconomic History   . Marital status: Widowed  Spouse name: Not on file   . Number of children: Not on file   . Years of education: Not on file   . Highest education level: Not on file   Occupational History   . Not on file   Tobacco Use   . Smoking status: Former     Types: Cigarettes   . Smokeless tobacco: Not on file   Substance and Sexual Activity   . Alcohol use: Not Currently   . Drug use: Not Currently   . Sexual activity: Not Currently   Other Topics Concern   . Not on file   Social History Narrative   . Not on file     Social Determinants of Health     Financial Resource Strain: Not on file   Food Insecurity: Not on file   Transportation Needs: Not on file   Physical Activity: Not on file   Stress: Not on file   Intimate Partner Violence: Not on file   Housing Stability: Not on file       ROS: Other than ROS in the HPI, all other systems were negative.    Examination:  Temperature: 36.5 C (97.7 F) Heart Rate: 73 BP (Non-Invasive): 138/82   Respiratory Rate: 16 SpO2: 91 %       Constitutional:  NAD, elderly, using a board with marker to communicate due tos severe hoarseness  Head: Atraumatic, normocephalic  Eyes:   Conjunctivae/corneas clear, PERRLA, EOM intact  ENT:  ENMT without erythema or injection, mucous membranes moist  Neck:  Supple, symmetrical, trachea midline  Respiratory:  Mild wheezing  Cardiovascular:  S1, S2, regular rate and rhythm, no murmur, click, rub or gallop; no significant LE edema  Gastrointestinal:  Soft, non-tender, BS all 4 Qs, no masses.  Musculoskeletal:  PROM  Integumentary:  Skin warm and dry, no rashes and no lesions  Neurologic:  CN II - XII grossly intact  Psychiatric: Normal affect and behavior.    Labs:    I have reviewed all lab results.    Imaging Studies:    I have reviewed patient pertinent imaging studies.    DNR Status:  Full Code    Assessment/Plan:   Active Hospital Problems    Diagnosis   . Primary Problem: Mediastinal mass     Misty Johnson is a 70 y.o., White female with medical history of CAD, HFrEF, COPD and chronic hypoxic respiratory failure on 3L NC at home who presented to [facility] due to having SOB and hoarseness for the last few days found to have COPD exacerbation and mediastinal mass.    Acute COPD exacerbation  Chronic hypoxic respiratory failure  - Duonebs schedule with albuterol PRN  - Pulmonary toilet  - Symbicort BID  - Prednisone 40 mg daily for 5 days  - Doxycycline 40 mg daily for 5 days  - Continue with 3L NC with goal SpO2 88%+    Mediastinal mass and hoarseness  - Consulting with thoracic surgery  - CT scan images are uploaded on [employer]    CAD  HFrEF  - Continue with home ASA and Lipitor  - Obtaining TTE    Mood disorder  - Continue with home lexapro    GERD  - Pepcid    Unable to perform full med recs due to patient poor recollection and missing pages from medical record sent over from OSH.      DVT/PE Prophylaxis: Enoxaparin      Medical Decision Making:  Problem: There is at least one new, significant, and acute issue with additional workup and management planned. Patient has comorbidity contributing to the complexity of this  case.     Data:   All applicable labs tests, EKG tracings, and radiographs have been reviewed, directly visualized and independently interpreted reviewed by me. Selected old records, if applicable, have been reviewed and summarized. I ordered multiple additional tests.      Risk: Risk of complications and/or morbidity or mortality is HIGH.     E/M Attestation    Total time of encounter spent in direct care of this patient including initial evaluation, review of laboratory, radiology, diagnostic studies, review of medical record, order entry and coordination of care was 80 minutes with greater than 50% of encounter spent discussing diagnoses and plan of care with patient face-to-face at the bedside and coordination of care on the patient's unit.     Kathrine Cords, M.D.   Department of Internal Medicine  Pager: [phone]

## 2021-05-28 NOTE — Nurses Notes (Signed)
Pt's throat is reddened and sore, her tongue with thin white patch, notified Bary Leriche, no order is given at the time. Will monitor.

## 2021-05-28 NOTE — Consults (Signed)
[facility]  Thoracic Surgery   Thoracic Surgery Consult Initial    Date of Service:  05/28/2021  Samoni, Plumley  Encounter Start Date:  05/28/2021  Inpatient Admission Date:  05/28/2021  Date of Birth:  1950/08/08    Chief Complaint: SOB, hoarseness       HPI:  Misty Johnson is a 70 y.o. White female who is admitted with shortness of breath and hoarseness of voice. Patient originally presented to [facility] where a CT chest was performed that showed a mediastinal mass. Patient reports shortness of breath and hoarseness along with increased wheezing, sputum production, and cough.  PMH includes CAD, HFrEF, COPD on 3L NC at home.     ROS Other than ROS in the HPI, all other systems were negative.  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     Enlarged heart     LVH (left ventricular hypertrophy)     Major depression            Allergy:  No Known Allergies    Prior to Admission Medications:  Medications Prior to Admission       Prescriptions    albuterol sulfate (PROVENTIL OR VENTOLIN OR PROAIR) 90 mcg/actuation Inhalation oral inhaler    Take 1-2 Puffs by inhalation Every 6 hours as needed    aspirin 81 mg Oral Tablet, Chewable    Chew 1 Tablet (81 mg total) Once a day    atorvastatin (LIPITOR) 40 mg Oral Tablet    Take 1 Tablet (40 mg total) by mouth Every evening    escitalopram oxalate (LEXAPRO) 20 mg Oral Tablet    Take 1 Tablet (20 mg total) by mouth Once a day    Levocetirizine (XYZAL) 5 mg Oral Tablet    Take 1 Tablet (5 mg total) by mouth Once a day     meloxicam (MOBIC) 15 mg Oral Tablet    Take 1 Tablet (15 mg total) by mouth Once a day    omeprazole (PRILOSEC) 20 mg Oral Capsule, Delayed Release(E.C.)    Take 1 Capsule (20 mg total) by mouth Once a day    OXYGEN-AIR DELIVERY SYSTEMS DEACT    3L NC at home             Current Inpatient Medications:  acetaminophen (TYLENOL) tablet, 650 mg, Oral, Q4H PRN  albuterol (PROVENTIL) 2.5 mg / 3 mL (0.083%) neb solution, 2.5 mg, Nebulization, Q4H PRN  aspirin chewable tablet 81 mg, 81 mg, Oral, Daily  atorvastatin (LIPITOR) tablet, 40 mg, Oral, QPM  budesonide-formoterol (SYMBICORT) 160 mcg-4.5 mcg per inhalation oral inhaler - "Respiratory to administer", 4 Puff, Inhalation, 2x/day  D5W 250 mL flush bag, , Intravenous, Q15 Min PRN  doxycycline tablet, 100 mg, Oral, 2x/day  enoxaparin PF (LOVENOX) 40 mg/0.4 mL SubQ injection, 40 mg, Subcutaneous, Daily  escitalopram (LEXAPRO) tablet, 20 mg, Oral, Daily  famotidine (PEPCID) tablet, 20 mg, Oral, Daily  ipratropium-albuterol 0.5 mg-3 mg(2.5 mg base)/3  mL Solution for Nebulization, 3 mL, Nebulization, 4x/day  NS 250 mL flush bag, , Intravenous, Q15 Min PRN  NS flush syringe, 2-6 mL, Intracatheter, Q8HRS  NS flush syringe, 2-6 mL, Intracatheter, Q1 MIN PRN  ondansetron (ZOFRAN) 2 mg/mL injection, 4 mg, Intravenous, Q6H PRN  potassium bicarbonate-citric acid (EFFER-K) effervescent tablet, 40 mEq, Oral, Once  predniSONE (DELTASONE) tablet, 40 mg, Oral, Daily with Breakfast        Past Surgical History:   Procedure Laterality Date    NO PAST SURGERIES             Past Family History:   Family Medical History:       Problem Relation (Age of Onset)    No Known Problems Mother, Father            Social History     Tobacco Use    Smoking status: Former     Types: Cigarettes   Substance Use Topics    Alcohol use: Not Currently    Drug use: Not Currently       Exam:   Temperature: 37.1 C (98.8 F)  Heart Rate: 79  BP (Non-Invasive): (!) 152/68 (RN Notified)  Respiratory Rate:  16  SpO2: 97 %  Constitutional:  no distress and vital signs reviewed  Eyes:  Conjunctiva clear.  ENT:  Mouth mucous membranes moist.   Neck:  supple, symmetrical, trachea midline  Respiratory:  wheezes bilaterally  Cardiovascular:  regular rate and rhythm  Gastrointestinal:  Soft  Musculoskeletal:  Head atraumatic and normocephalic  Integumentary:  Skin warm and dry  Neurologic:  Grossly normal    Labs:  I have reviewed all lab results.  Lab Results Today:    Results for orders placed or performed during the hospital encounter of 05/28/21 (from the past 24 hour(s))   MAGNESIUM   Result Value Ref Range    MAGNESIUM 2.5 1.8 - 2.6 mg/dL   BASIC METABOLIC PANEL   Result Value Ref Range    SODIUM 138 136 - 145 mmol/L    POTASSIUM 3.4 (L) 3.5 - 5.1 mmol/L    CHLORIDE 92 (L) 96 - 111 mmol/L    CO2 TOTAL 36 (H) 23 - 31 mmol/L    ANION GAP 10 4 - 13 mmol/L    CALCIUM 9.6 8.8 - 10.2 mg/dL    GLUCOSE 116 65 - 125 mg/dL    BUN 32 (H) 8 - 25 mg/dL    CREATININE 0.99 0.60 - 1.05 mg/dL    BUN/CREA RATIO 32 (H) 6 - 22    ESTIMATED GFR 61 >=60 mL/min/BSA   HEPATIC FUNCTION PANEL   Result Value Ref Range    ALBUMIN 3.4 3.4 - 4.8 g/dL     ALKALINE PHOSPHATASE 73 55 - 145 U/L    ALT (SGPT) 18 8 - 22 U/L    AST (SGOT)  20 8 - 45 U/L    BILIRUBIN TOTAL 0.9 0.3 - 1.3 mg/dL    BILIRUBIN DIRECT 0.4 0.1 - 0.4 mg/dL    PROTEIN TOTAL 6.3 6.0 - 8.0 g/dL   PHOSPHORUS   Result Value Ref Range    PHOSPHORUS 4.9 (H) 2.3 - 4.0 mg/dL   PT/INR   Result Value Ref Range    PROTHROMBIN TIME 10.1 9.1 - 13.9 seconds    INR 0.88 0.80 - 1.20   C-REACTIVE PROTEIN(CRP),INFLAMMATION   Result Value Ref Range    CRP INFLAMMATION <0.4 <8.0 mg/L   THYROID STIMULATING HORMONE WITH FREE T4  REFLEX   Result Value Ref Range    TSH 2.493 0.430 - 3.550 uIU/mL   PROCALCITONIN ALGORITHM   Result Value Ref Range    PROCALCITONIN 0.03 <0.50 ng/mL   TROPONIN-I   Result Value Ref Range    TROPONIN I 18 0 - 30 ng/L   B-TYPE NATRIURETIC PEPTIDE   Result Value Ref Range    BNP  297 (H) <=100 pg/mL   CBC WITH DIFF   Result Value Ref Range    WBC 13.4 (H) 3.7 - 11.0 x103/uL    RBC 5.38 (H) 3.85 - 5.22 x106/uL    HGB 13.8 11.5 - 16.0 g/dL    HCT 38.7 56.4 - 33.2 %    MCV 84.4 78.0 - 100.0 fL    MCH 25.7 (L) 26.0 - 32.0 pg    MCHC 30.4 (L) 31.0 - 35.5 g/dL    RDW-CV 95.1 (H) 88.4 - 15.5 %    PLATELETS 176 150 - 400 x103/uL    MPV 11.7 8.7 - 12.5 fL    NEUTROPHIL % 84 %    LYMPHOCYTE % 4 %    MONOCYTE % 11 %    EOSINOPHIL % 0 %    BASOPHIL % 0 %    NEUTROPHIL # 11.24 (H) 1.50 - 7.70 x103/uL    LYMPHOCYTE # 0.59 (L) 1.00 - 4.80 x103/uL    MONOCYTE # 1.44 (H) 0.20 - 1.10 x103/uL    EOSINOPHIL # <0.10 <=0.50 x103/uL    BASOPHIL # <0.10 <=0.20 x103/uL    IMMATURE GRANULOCYTE % 1 0 - 1 %    IMMATURE GRANULOCYTE # <0.10 <0.10 x103/uL   TRANSTHORACIC ECHOCARDIOGRAM - ADULT   Result Value Ref Range    LVOT diameter 1.90 cm    Left Ventricular Outflow Tract Peak Velocity 109.00 cm/s    AV LVOT peak gradient 5.00 mmHg    TDI 5.66 cm/s    TDI 5.66 cm/s    E/E' ratio 11.30     LVOT stroke volume 73.00 cm3    LA volume 51.20 cm3    LA Volume Index 33.0 ml/m2    AV mean gradient 3.00 mmHg    Ao VTI 27.60 cm    Aortic Valve Area by Continuity of VTI 2.65 cm2    Ao root annulus 3.80 cm    Ao root annulus 3.80 cm    E wave decelartion time 240.00 ms    MV stenosis pressure 1/2 time 70.00 ms    MV Peak A Vel 92.40 cm/s    MV Peak E Vel 64.00 cm/s    E/A ratio 0.70     Pulmonic Valve Acceleration Time 95.00 ms       Radiology Tests:   CT chest 05/27/2021:  Anterior mediastinal mass    ASSESSMENT:  Anterior mediastinal mass  HFrEF  CAD  COPD on 3L NC at baseline    Active Hospital Problems    Diagnosis    Primary Problem: Mediastinal mass         Body mass index is 28.84 kg/m.      DNR Status:  Full Code      PLAN:   -Reviewed with Dr. Doree Barthel  -Please order myasthenia gravis panel, serum beta HCG, serum LDH, serum calcium  -Will discuss surgical resection of the mediastinal mass on either inpatient or  outpatient basis pending MG/thymoma workup  -Rest of care per primary team  -Thank you for this consult. We will continue to follow.  I independently of the faculty provider spent a total of (25) minutes in direct/indirect care of this patient including initial evaluation, review of laboratory, radiology, diagnostic studies, review of medical record, order entry and coordination of care.      Duncan Dull, PA-C 05/28/2021 14:53    I personally saw and evaluated the patient. See  Raquel Sarna Nelson's note for additional details.   Myra Gianotti, MD

## 2021-05-28 NOTE — Pharmacy (Signed)
Pharmacy Medication Reconciliation    Patient Name: Misty Johnson, Misty Johnson  Date of Service: 05/28/2021  Date of Admission: 05/28/2021  Date of Birth: [birth year]  Length of Stay:   0 days   Service: HOSPITALIST 4      Transitions of Care:  Discharge Pharmacy services were discussed with the patient and the Meds to [facility] flowsheet and preferred pharmacy information were updated in EMR if applicable and able to assess with patient.    Information was collected from:  Pharmacy and Caregiver    [facility] - [place], [place] - [address]  [address] [place] [address]  Phone: [phone] Fax: [phone]     Naveyah Iacovelli (daughter) [phone]    Clarified Prior to Admission Medications:  Prior to Admission medications    Medication Sig Taking Resumed Y/N (RPh) Comments   albuterol sulfate (PROVENTIL) 2.5 mg /3 mL (0.083 %) Inhalation nebulizer solution Take 3 mL (2.5 mg total) by nebulization Once per day as needed for Wheezing Yes  Yes  Per daughter, patient is receiving from PCP.    aspirin 81 mg Oral Tablet, Chewable Chew 1 Tablet (81 mg total) Once a day Yes  Yes     atorvastatin (LIPITOR) 40 mg Oral Tablet Take 1 Tablet (40 mg total) by mouth Every evening Yes  Yes  Filled on 04/27/2021.   budesonide/glycopyr/formoterol (BREZTRI AEROSPHERE INHL) Take by inhalation Once a day Yes  SUB  Per daughter, patient is receiving from PCP. Daughter can't remember strength.    ergocalciferol, vitamin D2, (DRISDOL) 1,250 mcg (50,000 unit) Oral Capsule Take 1 Capsule (50,000 Units total) by mouth Every 7 days Yes  No  Filled on 04/21/2021.   escitalopram oxalate (LEXAPRO) 20 mg Oral Tablet Take 1 Tablet (20 mg total) by mouth Once a day Yes  Yes  Filled on 03/06/2021, #90.   furosemide (LASIX) 40 mg Oral Tablet Take 1 Tablet (40 mg total) by mouth Once a day Yes  No  Filled on 05/08/2021.   Levocetirizine (XYZAL) 5 mg Oral Tablet Take 1 Tablet (5 mg total) by mouth Every evening Yes  No  Filled  on 05/08/2021.   omeprazole (PRILOSEC) 20 mg Oral Capsule, Delayed Release(E.C.) Take 1 Capsule (20 mg total) by mouth Once a day Yes  No  Filled on 03/29/2021.   OXYGEN-AIR DELIVERY SYSTEMS DEACT 3L NC at home Yes  N/A     potassium chloride (K-DUR) 20 mEq Oral Tab Sust.Rel. Particle/Crystal Take 1 Tablet (20 mEq total) by mouth Once a day Yes  No  Filled on 04/21/2021.       Did patient's home medication list require updates? Yes    Medications UPDATED on Prior to Admission Med List:  -Levocetirizine (XYZAL) 5 mg Oral Tablet,  to Take 1 Tablet (5 mg total) by mouth Every evening      Medications ADDED to Prior to Admission Med List:  -budesonide/glycopyr/formoterol (BREZTRI AEROSPHERE INHL)  -potassium chloride (K-DUR) 20 mEq Oral Tab Sust.Rel. Particle/Crystal  -ergocalciferol, vitamin D2, (DRISDOL) 1,250 mcg (50,000 unit) Oral Capsule  -albuterol sulfate (PROVENTIL) 2.5 mg /3 mL (0.083 %) Inhalation nebulizer solution    Allergies:    No Known Allergies    Shawnee Simone Curia, CPhT    Did pharmacist make suggestions for medication reconciliation? Yes    Medications REMOVED from home medication list:  - albuterol sulfate (PROVENTIL OR VENTOLIN OR PROAIR) 90 mcg/actuation Inhalation oral inhaler (pt is not taking)  - meloxicam (  MOBIC) 15 mg Oral Tablet (pt is not taking)    Pharmacist Recommendations:  - Team to review above medication list, changes, & compliance. Please resume appropriate home meds as clinically indicated.     Nani Skillern, [facility]  05/28/2021, 15:58

## 2021-05-28 NOTE — Care Plan (Signed)
[facility]    [facility]  Medical Nutrition Therapy Screen Note                                      Date of Service: 05/28/2021    Reason for Note: Nursing Nutritional Risk notification:  reduced dietary intake in the last week    Current Diet Order/Nutrition Support:  DIET REGULAR    Reviewed patient status, diet order/TF/TPN, labs and medications.  Height: 162.6 cm (5\' 4" )   Weight: 76.2 kg (167 lb 15.9 oz) (05/28/21 0526)   Body mass index is 28.84 kg/m.    Brief Subjective:   PMH CAD, COPD admitted with SOB and new mediastinal mass. Pt stated she was eating well until a few days ago. Doesn't feel hungry now and throat is sore from admission at outside facility and making it hard to eat. Stated UBW 142# and that her weight fluctuates up and down a lot. No wt hx in EMR, but current weight 167# standing. Reported some nausea and getting Zofran this morning to help. Doesn't drink any supplements at home and doesn't like Ensure, but willing to try some orange magic cups. Encouraged small, frequent meals and and snacks and bland foods with nausea present. Discussed soft foods to try until throat feels better.     Most Recent Screen Previous screen   Impaired Nutrition Status Score Impaired Nutrition Status Score: 1 - Food intake below 50 - 75% of normal requirement in preceding week - Mild (05/28/21 1200)   Impaired Nutrition Status Score: 1 - Food intake below 50 - 75% of normal requirement in preceding week - Mild (05/28/21 1200)           Severity of Disease Score Severity of Disease Score: 1 - Chronic patients in particular with acute complications: cirrhosis, COPD, dialysis, oncology, recent bariatric surgery - Mild (05/28/21 1200)   Severity of Disease Score: 1 - Chronic patients in particular with acute complications: cirrhosis, COPD, dialysis, oncology, recent bariatric surgery - Mild (05/28/21 1200)       Age Age: 72 - > 70 years (05/28/21 1200) Age: 72 - > 70 years  (05/28/21 1200)     Score Score: 3 (05/28/21 1200)     Score: 3 (05/28/21 1200)   Risk Level Risk Level: Level 3 - 3 pts - Screen note required (05/28/21 1200)   Risk Level: Level 3 - 3 pts - Screen note required (05/28/21 1200)           Nutrition related problems: Nausea, sore throat    Assessment: No assessment at this time    Monitor: Tolerance of diet    Plan/Intervention:  - Continue Regular diet  - Order orange magic cups BID to promote po intake  - Order MNT protocol - service paged  - Encouraged small, frequent meals and snacks of bland foods to help with nausea  - Discussed some soft foods for pt to try while throat is sore  - Monitor weight twice per week - standing for accuracy    05/30/21, RDLD  05/28/2021, 12:18  Pager [phone]

## 2021-05-28 NOTE — Progress Notes (Signed)
Patient admitted after midnight. Agree with admitting provider's assessment and plan. Patient seen and examined this AM. She is currently on baseline 3L NC. Voice is hoarse. Bilateral mild wheezing present on exam. Continue steroids, breathing treatments, doxycycline for AECOPD. Thoracic consulted and recommending additional labs for thymoma workup.

## 2021-05-29 DIAGNOSIS — J449 Chronic obstructive pulmonary disease, unspecified: Secondary | ICD-10-CM

## 2021-05-29 DIAGNOSIS — Z7982 Long term (current) use of aspirin: Secondary | ICD-10-CM

## 2021-05-29 DIAGNOSIS — Z7901 Long term (current) use of anticoagulants: Secondary | ICD-10-CM

## 2021-05-29 DIAGNOSIS — Z7902 Long term (current) use of antithrombotics/antiplatelets: Secondary | ICD-10-CM

## 2021-05-29 DIAGNOSIS — Z7951 Long term (current) use of inhaled steroids: Secondary | ICD-10-CM

## 2021-05-29 DIAGNOSIS — J9859 Other diseases of mediastinum, not elsewhere classified: Principal | ICD-10-CM

## 2021-05-29 LAB — URINALYSIS, MICROSCOPIC
RBCS: >182 /hpf — ABNORMAL HIGH (ref <6.0)
WBCS: 17 /hpf — ABNORMAL HIGH (ref <11.0)

## 2021-05-29 LAB — CBC WITH DIFF
BASOPHIL #: <0.1 10*3/uL (ref <=0.20)
BASOPHIL %: 0 %
EOSINOPHIL #: <0.1 10*3/uL (ref <=0.50)
EOSINOPHIL %: 0 %
HCT: 42.5 % (ref 34.8–46.0)
HGB: 12.7 g/dL (ref 11.5–16.0)
IMMATURE GRANULOCYTE #: <0.1 10*3/uL (ref <0.10)
IMMATURE GRANULOCYTE %: 1 % (ref 0–1)
LYMPHOCYTE #: 0.37 10*3/uL — ABNORMAL LOW (ref 1.00–4.80)
LYMPHOCYTE %: 3 %
MCH: 25.4 pg — ABNORMAL LOW (ref 26.0–32.0)
MCHC: 29.9 g/dL — ABNORMAL LOW (ref 31.0–35.5)
MCV: 85 fL (ref 78.0–100.0)
MONOCYTE #: 0.76 10*3/uL (ref 0.20–1.10)
MONOCYTE %: 5 %
MPV: 11.4 fL (ref 8.7–12.5)
NEUTROPHIL #: 13.45 10*3/uL — ABNORMAL HIGH (ref 1.50–7.70)
NEUTROPHIL %: 91 %
PLATELETS: 155 10*3/uL (ref 150–400)
RBC: 5 10*6/uL (ref 3.85–5.22)
RDW-CV: 16.8 % — ABNORMAL HIGH (ref 11.5–15.5)
WBC: 14.7 10*3/uL — ABNORMAL HIGH (ref 3.7–11.0)

## 2021-05-29 LAB — BASIC METABOLIC PANEL
ANION GAP: 10 mmol/L (ref 4–13)
BUN/CREA RATIO: 37 — ABNORMAL HIGH (ref 6–22)
BUN: 34 mg/dL — ABNORMAL HIGH (ref 8–25)
CALCIUM: 9.2 mg/dL (ref 8.8–10.2)
CHLORIDE: 92 mmol/L — ABNORMAL LOW (ref 96–111)
CO2 TOTAL: 34 mmol/L — ABNORMAL HIGH (ref 23–31)
CREATININE: 0.92 mg/dL (ref 0.60–1.05)
ESTIMATED GFR: 66 mL/min/BSA (ref >=60)
GLUCOSE: 130 mg/dL — ABNORMAL HIGH (ref 65–125)
POTASSIUM: 4.3 mmol/L (ref 3.5–5.1)
SODIUM: 136 mmol/L (ref 136–145)

## 2021-05-29 LAB — URINALYSIS, MACROSCOPIC
BILIRUBIN: NEGATIVE mg/dL
GLUCOSE: NEGATIVE mg/dL
KETONES: NEGATIVE mg/dL
LEUKOCYTES: NEGATIVE WBCs/uL
NITRITE: NEGATIVE
PH: 5.5 (ref 5.0–8.0)
PROTEIN: 100 mg/dL — AB
SPECIFIC GRAVITY: 1.022 (ref 1.005–1.030)
UROBILINOGEN: NEGATIVE mg/dL

## 2021-05-29 MED ORDER — IPRATROPIUM 0.5 MG-ALBUTEROL 3 MG (2.5 MG BASE)/3 ML NEBULIZATION SOLN
INHALATION_SOLUTION | RESPIRATORY_TRACT | Status: AC
Start: 2021-05-29 — End: 2021-05-29
  Filled 2021-05-29: qty 12

## 2021-05-29 MED ORDER — NYSTATIN 100,000 UNIT/ML ORAL SUSPENSION
5.0000 mL | Freq: Four times a day (QID) | ORAL | Status: DC
Start: 2021-05-29 — End: 2021-06-08
  Administered 2021-05-29 – 2021-05-30 (×6): 5 mL via ORAL
  Administered 2021-05-30 – 2021-05-31 (×2): 0 mL via ORAL
  Administered 2021-05-31 (×2): 5 mL via ORAL
  Administered 2021-05-31: 09:00:00 0 mL via ORAL
  Administered 2021-06-01: 08:00:00 5 mL via ORAL
  Administered 2021-06-01 (×2): 0 mL via ORAL
  Administered 2021-06-01: 20:00:00 5 mL via ORAL
  Administered 2021-06-02 (×2): 0 mL via ORAL
  Administered 2021-06-02 – 2021-06-03 (×5): 5 mL via ORAL
  Administered 2021-06-03: 18:00:00 0 mL via ORAL
  Administered 2021-06-04 – 2021-06-08 (×16): 5 mL via ORAL
  Filled 2021-05-29 (×32): qty 5

## 2021-05-29 MED ORDER — GUAIFENESIN 100 MG/5 ML ORAL LIQUID
200.0000 mg | ORAL | Status: DC | PRN
Start: 2021-05-29 — End: 2021-06-08

## 2021-05-29 NOTE — Nurses Notes (Signed)
Patient is alert and oriented. Patient tolerated her morning medications with sips of water. High fall precautions maintained. Sitter select in place. Assessment per flowsheet. Will continue to monitor.

## 2021-05-29 NOTE — Progress Notes (Signed)
Misty Johnson  Thoracic Surgery   Progress Note      Misty Johnson, Misty Johnson  Date of Admission:  05/28/2021  Date of Birth:  14-Oct-1950    Hospital Day:  LOS: 1 day   Date of Service:  05/29/2021    Subjective   No acute events overnight. Breathing stable.    Objective      Vital Signs:  Temp (24hrs) Max:37.9 C (100.2 F)      Temperature: 37.4 C (99.3 F)  BP (Non-Invasive): 96/68  MAP (Non-Invasive): 76 mmHG  Heart Rate: 73  Respiratory Rate: 16  SpO2: 92 %    Base (Admission) Weight:  Weight: 76.2 kg (167 lb 15.9 oz)  Weight:  Weight: 76.2 kg (167 lb 15.9 oz)      Current Medications:  acetaminophen (TYLENOL) tablet, 650 mg, Oral, Q4H PRN  albuterol (PROVENTIL) 2.5 mg / 3 mL (0.083%) neb solution, 2.5 mg, Nebulization, Q4H PRN  aspirin chewable tablet 81 mg, 81 mg, Oral, Daily  atorvastatin (LIPITOR) tablet, 40 mg, Oral, QPM  budesonide-formoterol (SYMBICORT) 160 mcg-4.5 mcg per inhalation oral inhaler - "Respiratory to administer", 4 Puff, Inhalation, 2x/day  calcium carbonate (TUMS) 500mg  (200mg  elemental calcium) chewable tablet, 500 mg, Oral, 3x/day PRN  D5W 250 mL flush bag, , Intravenous, Q15 Min PRN  doxycycline tablet, 100 mg, Oral, 2x/day  enoxaparin PF (LOVENOX) 40 mg/0.4 mL SubQ injection, 40 mg, Subcutaneous, Daily  escitalopram (LEXAPRO) tablet, 20 mg, Oral, Daily  ipratropium-albuterol 0.5 mg-3 mg(2.5 mg base)/3 mL Solution for Nebulization, 3 mL, Nebulization, 4x/day  loratadine (CLARITIN) tablet, 10 mg, Oral, Daily  NS 250 mL flush bag, , Intravenous, Q15 Min PRN  NS flush syringe, 2-6 mL, Intracatheter, Q8HRS  NS flush syringe, 2-6 mL, Intracatheter, Q1 MIN PRN  nystatin (MYCOSTATIN) 100,000 units per mL oral liquid, 5 mL, Swish & Swallow, 4x/day  ondansetron (ZOFRAN) 2 mg/mL injection, 4 mg, Intravenous, Q6H PRN  pantoprazole (PROTONIX) delayed release tablet, 20 mg, Oral, Daily before Breakfast  predniSONE (DELTASONE) tablet, 40 mg, Oral, Daily with Breakfast      Appropriate Home Meds  restarted:   Per primary team    I/O:  I/O last 24 hours to current time:      Intake/Output Summary (Last 24 hours) at 05/29/2021 1210  Last data filed at 05/29/2021 1157  Gross per 24 hour   Intake 375 ml   Output 1000 ml   Net -625 ml     I/O last 3 completed shifts:  11/26 0700 - 11/26 1859  In: 240 [P.O.:240]  Out: 0       In: 210 [P.O.:185; I.V.:25]  Out: 1000 [Urine:1000]    Nutrition/Diet:  DIET REGULAR  MNT PROTOCOL FOR DIETITIAN  DIETARY ORAL SUPPLEMENTS Oral Supplements with tray: Magic Cup-Orange; LUNCH/DINNER; 1 Each    Labs:  Reviewed:  I have reviewed all lab results.  Lab Results Today:  No results found for any visits on 05/28/21 (from the past 24 hour(s)).    Radiology:    Reviewed:   No new imaging    Physical Exam:  Constitutional:  no distress and vital signs reviewed  Eyes:  Conjunctiva clear.  ENT:  Mouth mucous membranes moist.   Neck:  supple, symmetrical, trachea midline  Respiratory:  No respiratory distress. On nasal cannula  Cardiovascular:  regular rate and rhythm  Musculoskeletal:  Head atraumatic and normocephalic  Integumentary:  Skin warm and dry  Neurologic:  Grossly normal  ASSESSMENT:  Mediastinal mass  COPD on nasal cannula at baseline    Problem List:  Active Hospital Problems    Diagnosis    Primary Problem: Mediastinal mass       PT/OT:  Per primary team    Disposition Planning:  Per primary team      PLAN:  -MG labs pending  -Will tentatively plan for VATS resection of mediastinal mass mid-next week  -Rest of care per primary team  -Thank you for this consult. We will continue to follow.    I independently of the faculty provider spent a total of (10) minutes in direct/indirect care of this patient including initial evaluation, review of laboratory, radiology, diagnostic studies, review of medical record, order entry and coordination of care.      Thersa Salt, PA-C 05/29/2021. 12:10  I personally saw and evaluated the patient. See Irving Burton Nelson's note for additional  details.   Bing Matter, MD

## 2021-05-29 NOTE — Progress Notes (Addendum)
[facility]  Medicine Progress Note    Misty Johnson  Date of service: 05/29/2021  Date of Admission:  05/28/2021    Hospital Day:  LOS: 1 day       Chief Complaint: F/U SOB, Hoarsness     Subjective:   Overnight patient with urinary retention and required straight cath x1. SOB improved. Still on baseline O2. She is complaining of a dry mouth. Daughter updated via phone call in afternoon to discuss plan of care.    Vital Signs:  Temp  Avg: 37.4 C (99.4 F)  Min: 37.1 C (98.8 F)  Max: 37.9 C (100.2 F)    Pulse  Avg: 81  Min: 73  Max: 90 BP  Min: 96/68  Max: 172/72   Resp  Avg: 16  Min: 16  Max: 16 SpO2  Avg: 92 %  Min: 91 %  Max: 93 %          Input/Output    Intake/Output Summary (Last 24 hours) at 05/29/2021 1845  Last data filed at 05/29/2021 1300  Gross per 24 hour   Intake 580 ml   Output 1000 ml   Net -420 ml    I/O last shift:  11/26 0700 - 11/26 1859  In: 520 [P.O.:520]  Out: 0    acetaminophen (TYLENOL) tablet, 650 mg, Oral, Q4H PRN  albuterol (PROVENTIL) 2.5 mg / 3 mL (0.083%) neb solution, 2.5 mg, Nebulization, Q4H PRN  aspirin chewable tablet 81 mg, 81 mg, Oral, Daily  atorvastatin (LIPITOR) tablet, 40 mg, Oral, QPM  budesonide-formoterol (SYMBICORT) 160 mcg-4.5 mcg per inhalation oral inhaler - "Respiratory to administer", 4 Puff, Inhalation, 2x/day  calcium carbonate (TUMS) 500mg  (200mg  elemental calcium) chewable tablet, 500 mg, Oral, 3x/day PRN  D5W 250 mL flush bag, , Intravenous, Q15 Min PRN  doxycycline tablet, 100 mg, Oral, 2x/day  enoxaparin PF (LOVENOX) 40 mg/0.4 mL SubQ injection, 40 mg, Subcutaneous, Daily  escitalopram (LEXAPRO) tablet, 20 mg, Oral, Daily  guaiFENesin 100mg  per 33mL oral liquid - for cough (expectorant), 200 mg, Oral, Q4H PRN  ipratropium-albuterol 0.5 mg-3 mg(2.5 mg base)/3 mL Solution for Nebulization, 3 mL, Nebulization, 4x/day  loratadine (CLARITIN) tablet, 10 mg, Oral, Daily  NS 250 mL flush bag, , Intravenous, Q15 Min PRN  NS flush syringe, 2-6 mL,  Intracatheter, Q8HRS  NS flush syringe, 2-6 mL, Intracatheter, Q1 MIN PRN  nystatin (MYCOSTATIN) 100,000 units per mL oral liquid, 5 mL, Swish & Swallow, 4x/day  ondansetron (ZOFRAN) 2 mg/mL injection, 4 mg, Intravenous, Q6H PRN  pantoprazole (PROTONIX) delayed release tablet, 20 mg, Oral, Daily before Breakfast  predniSONE (DELTASONE) tablet, 40 mg, Oral, Daily with Breakfast          Physical Exam  BP 104/62   Pulse 73   Temp 37.3 C (99.1 F)   Resp 16   Ht 1.626 m (5\' 4" )   Wt 76.2 kg (167 lb 15.9 oz)   SpO2 93%   BMI 28.84 kg/m       Constitutional: Alert and oriented x 3, NAD, appears chronically ill.   Eyes: Conjunctiva clear. Pupils equal and round. Sclera non-icteric.   ENT: Posterior oropharynx without exudate or erythema, mucous membranes moist. Thrush present. Voice is hoarse  Neck: Supple. Trachea midline. No JVD.   Respiratory: Clear to auscultation bilaterally. Normal effort.   Cardiovascular: regular rate and rhythm, S1, S2 normal. No murmur, click, rub or gallop  Gastrointestinal: Soft, non-tender, non distended. Bowel sounds normal in all 4 quadrants.  Genitourinary: Deferred  Musculoskeletal: Normal muscle tone and bulk. No edema, cynaosis, clubbing.   Integumentary:  Skin warm and dry. No rashes or lesions  Neurologic: Grossly non focal. Moves all 4 extremities.  Lymphatic/Immunologic/Hematologic: No lymphadenopathy. No ecchymosis or petechiae.   Psychiatric: Normal affect, mood, and speech.    Labs:    CBC  Diff   Lab Results   Component Value Date/Time    WBC 14.7 (H) 05/29/2021 05:21 PM    HGB 12.7 05/29/2021 05:21 PM    HCT 42.5 05/29/2021 05:21 PM    PLTCNT 155 05/29/2021 05:21 PM    RBC 5.00 05/29/2021 05:21 PM    MCV 85.0 05/29/2021 05:21 PM    MCHC 29.9 (L) 05/29/2021 05:21 PM    MCH 25.4 (L) 05/29/2021 05:21 PM    MPV 11.4 05/29/2021 05:21 PM    Lab Results   Component Value Date/Time    PMNS 91 05/29/2021 05:21 PM    MONOCYTES 5 05/29/2021 05:21 PM    BASOPHILS 0 05/29/2021  05:21 PM    BASOPHILS <0.10 05/29/2021 05:21 PM    PMNABS 13.45 (H) 05/29/2021 05:21 PM    LYMPHSABS 0.37 (L) 05/29/2021 05:21 PM    EOSABS <0.10 05/29/2021 05:21 PM    MONOSABS 0.76 05/29/2021 05:21 PM          Comprehensive Metabolic Profile    Lab Results   Component Value Date/Time    SODIUM 136 05/29/2021 05:21 PM    POTASSIUM 4.3 05/29/2021 05:21 PM    CHLORIDE 92 (L) 05/29/2021 05:21 PM    CO2 34 (H) 05/29/2021 05:21 PM    ANIONGAP 10 05/29/2021 05:21 PM    BUN 34 (H) 05/29/2021 05:21 PM    CREATININE 0.92 05/29/2021 05:21 PM    Lab Results   Component Value Date/Time    CALCIUM 9.2 05/29/2021 05:21 PM    PHOSPHORUS 4.9 (H) 05/28/2021 07:10 AM    ALBUMIN 3.4 05/28/2021 07:10 AM    TOTALPROTEIN 6.3 05/28/2021 07:10 AM    ALKPHOS 73 05/28/2021 07:10 AM    AST 20 05/28/2021 07:10 AM    ALT 18 05/28/2021 07:10 AM    TOTBILIRUBIN 0.9 05/28/2021 07:10 AM        CARDIAC MARKERS  Lab Results   Component Value Date    TROPONINI 18 05/28/2021            Radiology:   Last CXR Impression   XR CHEST AP AND LATERAL (Exam End: 05/28/2021  8:51 AM)    Impression    Mild pulmonary edema pattern.       Results for orders placed during the hospital encounter of 05/28/21    TRANSTHORACIC ECHOCARDIOGRAM - ADULT 05/28/2021 12:14 PM    Narrative  **See full report in linked PDF document**    Transthoracic Echocardiographic Report    ______________________________________________________________________________  Name: IKIA, GREGUS                         MRN: [id number]               Weight: 168 lb  Study Date: 05/28/2021 08:14 AM              DOB: 21-Mar-1951             Height: 64 in  Gender: Female  Age: 70 yrs                 BSA: 1.8 m2  Accession #: [id number]                    BP: 138/82 mmHg  Patient Location: [facility] [place]  Ordering Provider: Adline Mango  Tech: [id number]    ______________________________________________________________________________  Procedure:  Transthoracic complete echo  with contrast, 2D, spectral and tissue Doppler, color flow Doppler, M-mode.    Quality:  Technically difficult study due to limited acoustic windows.    Indications: Dyspnea,Respiratory failure (CMS HCC),Heart failure (CMS HCC),CAD (coronary artery disease)    Conclusions:  Left Ventricle: Normal left ventricular size. The left ventricular ejection fraction by visual assessment is estimated to be 65%. Regional wall motion abnormalities cannot be excluded due to limited  visualization. Left ventricular diastolic function is indeterminate.  Right Ventricle: Normal right ventricular size. Normal right ventricular systolic function.  Left Atrium: The left atrium is normal in size.  Right Atrium: The right atrium is of normal size.    Findings  Left Ventricle:   Normal left ventricular size. The left ventricular ejection fraction by visual assessment is estimated to be 65%. Left ventricular systolic function is normal. Regional wall motion  abnormalities cannot be excluded due to limited visualization. Left ventricular diastolic function is indeterminate.  Right Ventricle:   Normal right ventricular size. Normal right ventricular systolic function. Right ventricular systolic pressure is indeterminate.  Left Atrium:   The left atrium is normal in size.  Right Atrium:   The right atrium is of normal size.  Mitral Valve:   The mitral valve is not well visualized. No significant mitral regurgitation present.  Tricuspid Valve:   The tricuspid valve is not well visualized. No significant tricuspid regurgitation present.  Aortic Valve:   The aortic valve is not well visualized. No Aortic valve stenosis. No significant aortic regurgitation present.  Pulmonic Valve:   The pulmonic valve is not well visualized.  Pulmonary Artery:   The pulmonary artery is not well visualized.  Atrial Septum:   The interatrial septum is not well visualized.  IVC/Hepatic Veins:   The inferior vena cava was not visualized.  Aorta:   The aortic root  is of normal size.  Pericardium/Pleural space:   Normal pericardium with no pericardial effusion.    Electronically signed by: MD Thomos Lemons on 05/28/2021 12:14 PM              Assessment/ Plan:   Active Hospital Problems    Diagnosis   . Primary Problem: Mediastinal mass       Maylynn Orzechowski is a 70 y.o., White female with medical history of CAD, HFrEF, COPD and chronic hypoxic respiratory failure on 3L NC at home who presented to [facility] due to having SOB and hoarseness for the last few days found to have COPD exacerbation and mediastinal mass.    Acute COPD exacerbation  Chronic hypoxic respiratory failure  - Duonebs schedule with albuterol PRN  - Pulmonary toilet  - Symbicort BID  - Prednisone 40 mg daily for 5 days  - Doxycycline 40 mg daily for 5 days  - Continue with 3L NC with goal SpO2 88%+    Mediastinal mass and hoarseness  - thoracic surgery following, tentative plan for VATS next week  - CT scan images are uploaded on [employer]  - LDH, HCG, myasthenia panel ordered    Thrush   - nystatin  swish and swallow x 14 days    Acute urinary retention  - check UA  - bladder scan TID   - straight cath x1     CAD  HFrEF  - Continue with home ASA and Lipitor  - TTE with EF of 65%. Technically difficult study due to limited acoustic windows.     Mood disorder  - Continue lexapro    GERD  - continue PPI    Body mass index is 28.84 kg/m.    PT/OT: ordered    Consults: thoracic surgery    Hardware (lines, foley's, tubes):  Patient Lines/Drains/Airways Status     Active Line / Dialysis Catheter / Dialysis Graft / Drain / Airway / Wound     Name Placement date Placement time Site Days    Peripheral IV Left;Posterior Dorsal Metacarpals  (top of hand) 05/27/21  --  -- 2                 DVT/PE Prophylaxis: Lovenox    Diet: Regular    Disposition Planning: TBD    Code Status: Full Code       Total visit time was 35 minutes. I spent greater than 50% of the time counseling the patient face-to-face  regarding the diagnosis and management plan and/or on the unit coordinating the patient's care. The coordination of care involved services directly related to patient care, including reviewing data, obtaining relevant patient information, and discussing the case with other involved healthcare providers.     Carrolyn Leigh, DO  Pager [phone]

## 2021-05-29 NOTE — Nurses Notes (Addendum)
Pt c/o abdominal pain, bladder scan performed. Recorded volume of 703 mL. Pt attempted to void but unable to produce much. On call provider paged.     [phone]- On call provider verbally returned paged. Straight cath ordered.

## 2021-05-30 ENCOUNTER — Encounter (HOSPITAL_COMMUNITY): Payer: Self-pay | Admitting: Student in an Organized Health Care Education/Training Program

## 2021-05-30 LAB — BASIC METABOLIC PANEL
ANION GAP: 7 mmol/L (ref 4–13)
BUN/CREA RATIO: 44 — ABNORMAL HIGH (ref 6–22)
BUN: 32 mg/dL — ABNORMAL HIGH (ref 8–25)
CALCIUM: 8.7 mg/dL — ABNORMAL LOW (ref 8.8–10.2)
CHLORIDE: 96 mmol/L (ref 96–111)
CO2 TOTAL: 35 mmol/L — ABNORMAL HIGH (ref 23–31)
CREATININE: 0.72 mg/dL (ref 0.60–1.05)
ESTIMATED GFR: 89 mL/min/BSA (ref >=60)
GLUCOSE: 95 mg/dL (ref 65–125)
POTASSIUM: 3.8 mmol/L (ref 3.5–5.1)
SODIUM: 138 mmol/L (ref 136–145)

## 2021-05-30 LAB — CBC WITH DIFF
BASOPHIL #: <0.1 10*3/uL (ref <=0.20)
BASOPHIL %: 0 %
EOSINOPHIL #: <0.1 10*3/uL (ref <=0.50)
EOSINOPHIL %: 0 %
HCT: 37.6 % (ref 34.8–46.0)
HGB: 11.5 g/dL (ref 11.5–16.0)
IMMATURE GRANULOCYTE #: <0.1 10*3/uL (ref <0.10)
IMMATURE GRANULOCYTE %: 0 % (ref 0–1)
LYMPHOCYTE #: 1.03 10*3/uL (ref 1.00–4.80)
LYMPHOCYTE %: 7 %
MCH: 25.3 pg — ABNORMAL LOW (ref 26.0–32.0)
MCHC: 30.6 g/dL — ABNORMAL LOW (ref 31.0–35.5)
MCV: 82.6 fL (ref 78.0–100.0)
MONOCYTE #: 1.35 10*3/uL — ABNORMAL HIGH (ref 0.20–1.10)
MONOCYTE %: 10 %
MPV: 12.1 fL (ref 8.7–12.5)
NEUTROPHIL #: 11.4 10*3/uL — ABNORMAL HIGH (ref 1.50–7.70)
NEUTROPHIL %: 83 %
PLATELETS: 152 10*3/uL (ref 150–400)
RBC: 4.55 10*6/uL (ref 3.85–5.22)
RDW-CV: 16.6 % — ABNORMAL HIGH (ref 11.5–15.5)
WBC: 13.9 10*3/uL — ABNORMAL HIGH (ref 3.7–11.0)

## 2021-05-30 NOTE — Progress Notes (Signed)
[facility]  Medicine Progress Note    Misty Johnson  Date of service: 05/30/2021  Date of Admission:  05/28/2021    Hospital Day:  LOS: 2 days       Chief Complaint: F/U SOB, Hoarsness     Subjective:   No acute events overnight. Patient without acute complaints other than she is not a fan of the taste of nystatin swish and swallow.    Vital Signs:  Temp  Avg: 36.8 C (98.3 F)  Min: 36.6 C (97.9 F)  Max: 37.3 C (99.1 F)    Pulse  Avg: 64.7  Min: 51  Max: 75 BP  Min: 104/62  Max: 131/47   Resp  Avg: 16  Min: 16  Max: 16 SpO2  Avg: 95.2 %  Min: 93 %  Max: 97 %          Input/Output    Intake/Output Summary (Last 24 hours) at 05/30/2021 1352  Last data filed at 05/30/2021 1143  Gross per 24 hour   Intake 200 ml   Output 650 ml   Net -450 ml    I/O last shift:  11/27 0700 - 11/27 1859  In: 100 [P.O.:100]  Out: 0    acetaminophen (TYLENOL) tablet, 650 mg, Oral, Q4H PRN  albuterol (PROVENTIL) 2.5 mg / 3 mL (0.083%) neb solution, 2.5 mg, Nebulization, Q4H PRN  aspirin chewable tablet 81 mg, 81 mg, Oral, Daily  atorvastatin (LIPITOR) tablet, 40 mg, Oral, QPM  budesonide-formoterol (SYMBICORT) 160 mcg-4.5 mcg per inhalation oral inhaler - "Respiratory to administer", 4 Puff, Inhalation, 2x/day  calcium carbonate (TUMS) 500mg  (200mg  elemental calcium) chewable tablet, 500 mg, Oral, 3x/day PRN  D5W 250 mL flush bag, , Intravenous, Q15 Min PRN  doxycycline tablet, 100 mg, Oral, 2x/day  enoxaparin PF (LOVENOX) 40 mg/0.4 mL SubQ injection, 40 mg, Subcutaneous, Daily  escitalopram (LEXAPRO) tablet, 20 mg, Oral, Daily  guaiFENesin 100mg  per 31mL oral liquid - for cough (expectorant), 200 mg, Oral, Q4H PRN  ipratropium-albuterol 0.5 mg-3 mg(2.5 mg base)/3 mL Solution for Nebulization, 3 mL, Nebulization, 4x/day  loratadine (CLARITIN) tablet, 10 mg, Oral, Daily  NS 250 mL flush bag, , Intravenous, Q15 Min PRN  NS flush syringe, 2-6 mL, Intracatheter, Q8HRS  NS flush syringe, 2-6 mL, Intracatheter, Q1 MIN PRN  nystatin  (MYCOSTATIN) 100,000 units per mL oral liquid, 5 mL, Swish & Swallow, 4x/day  ondansetron (ZOFRAN) 2 mg/mL injection, 4 mg, Intravenous, Q6H PRN  pantoprazole (PROTONIX) delayed release tablet, 20 mg, Oral, Daily before Breakfast  predniSONE (DELTASONE) tablet, 40 mg, Oral, Daily with Breakfast          Physical Exam  BP (!) 122/48 Comment: RN notified  Pulse 51   Temp 36.9 C (98.4 F)   Resp 16   Ht 1.626 m (5\' 4" )   Wt 76.2 kg (167 lb 15.9 oz)   SpO2 96%   BMI 28.84 kg/m       Constitutional: Alert and oriented x 3, NAD, appears chronically ill.   Eyes: Conjunctiva clear. Pupils equal and round. Sclera non-icteric.   ENT: Posterior oropharynx without exudate or erythema, mucous membranes moist. Thrush improving. Voice is hoarse  Neck: Supple. Trachea midline. No JVD.   Respiratory: Clear to auscultation bilaterally. Normal effort.   Cardiovascular: regular rate and rhythm, S1, S2 normal. No murmur, click, rub or gallop  Gastrointestinal: Soft, non-tender, non distended. Bowel sounds normal in all 4 quadrants.   Genitourinary: Deferred  Musculoskeletal: Normal muscle tone and  bulk. No edema, cynaosis, clubbing.   Integumentary:  Skin warm and dry. No rashes or lesions  Neurologic: Grossly non focal. Moves all 4 extremities.  Lymphatic/Immunologic/Hematologic: No lymphadenopathy. No ecchymosis or petechiae.   Psychiatric: Normal affect, mood, and speech.    Labs:    CBC  Diff   Lab Results   Component Value Date/Time    WBC 13.9 (H) 05/30/2021 01:52 AM    HGB 11.5 05/30/2021 01:52 AM    HCT 37.6 05/30/2021 01:52 AM    PLTCNT 152 05/30/2021 01:52 AM    RBC 4.55 05/30/2021 01:52 AM    MCV 82.6 05/30/2021 01:52 AM    MCHC 30.6 (L) 05/30/2021 01:52 AM    MCH 25.3 (L) 05/30/2021 01:52 AM    MPV 12.1 05/30/2021 01:52 AM    Lab Results   Component Value Date/Time    PMNS 83 05/30/2021 01:52 AM    MONOCYTES 10 05/30/2021 01:52 AM    BASOPHILS 0 05/30/2021 01:52 AM    BASOPHILS <0.10 05/30/2021 01:52 AM    PMNABS  11.40 (H) 05/30/2021 01:52 AM    LYMPHSABS 1.03 05/30/2021 01:52 AM    EOSABS <0.10 05/30/2021 01:52 AM    MONOSABS 1.35 (H) 05/30/2021 01:52 AM          Comprehensive Metabolic Profile    Lab Results   Component Value Date/Time    SODIUM 138 05/30/2021 01:52 AM    POTASSIUM 3.8 05/30/2021 01:52 AM    CHLORIDE 96 05/30/2021 01:52 AM    CO2 35 (H) 05/30/2021 01:52 AM    ANIONGAP 7 05/30/2021 01:52 AM    BUN 32 (H) 05/30/2021 01:52 AM    CREATININE 0.72 05/30/2021 01:52 AM    GLUCOSE Negative 05/29/2021 07:53 PM    Lab Results   Component Value Date/Time    CALCIUM 8.7 (L) 05/30/2021 01:52 AM    PHOSPHORUS 4.9 (H) 05/28/2021 07:10 AM    ALBUMIN 3.4 05/28/2021 07:10 AM    TOTALPROTEIN 6.3 05/28/2021 07:10 AM    ALKPHOS 73 05/28/2021 07:10 AM    AST 20 05/28/2021 07:10 AM    ALT 18 05/28/2021 07:10 AM    TOTBILIRUBIN 0.9 05/28/2021 07:10 AM        CARDIAC MARKERS  Lab Results   Component Value Date    TROPONINI 18 05/28/2021            Radiology:   Last CXR Impression   XR CHEST AP AND LATERAL (Exam End: 05/28/2021  8:51 AM)    Impression    Mild pulmonary edema pattern.       Results for orders placed during the hospital encounter of 05/28/21    TRANSTHORACIC ECHOCARDIOGRAM - ADULT 05/28/2021 12:14 PM    Narrative  **See full report in linked PDF document**    Transthoracic Echocardiographic Report    ______________________________________________________________________________  Name: Misty Johnson                         MRN: [id number]               Weight: 168 lb  Study Date: 05/28/2021 08:14 AM              DOB: Aug 08, 1950             Height: 64 in  Gender: Female  Age: 70 yrs                 BSA: 1.8 m2  Accession #: [id number]                    BP: 138/82 mmHg  Patient Location: [facility] [place]  Ordering Provider: Adline Mango  Tech: [id number]    ______________________________________________________________________________  Procedure:  Transthoracic complete echo with contrast, 2D,  spectral and tissue Doppler, color flow Doppler, M-mode.    Quality:  Technically difficult study due to limited acoustic windows.    Indications: Dyspnea,Respiratory failure (CMS HCC),Heart failure (CMS HCC),CAD (coronary artery disease)    Conclusions:  Left Ventricle: Normal left ventricular size. The left ventricular ejection fraction by visual assessment is estimated to be 65%. Regional wall motion abnormalities cannot be excluded due to limited  visualization. Left ventricular diastolic function is indeterminate.  Right Ventricle: Normal right ventricular size. Normal right ventricular systolic function.  Left Atrium: The left atrium is normal in size.  Right Atrium: The right atrium is of normal size.    Findings  Left Ventricle:   Normal left ventricular size. The left ventricular ejection fraction by visual assessment is estimated to be 65%. Left ventricular systolic function is normal. Regional wall motion  abnormalities cannot be excluded due to limited visualization. Left ventricular diastolic function is indeterminate.  Right Ventricle:   Normal right ventricular size. Normal right ventricular systolic function. Right ventricular systolic pressure is indeterminate.  Left Atrium:   The left atrium is normal in size.  Right Atrium:   The right atrium is of normal size.  Mitral Valve:   The mitral valve is not well visualized. No significant mitral regurgitation present.  Tricuspid Valve:   The tricuspid valve is not well visualized. No significant tricuspid regurgitation present.  Aortic Valve:   The aortic valve is not well visualized. No Aortic valve stenosis. No significant aortic regurgitation present.  Pulmonic Valve:   The pulmonic valve is not well visualized.  Pulmonary Artery:   The pulmonary artery is not well visualized.  Atrial Septum:   The interatrial septum is not well visualized.  IVC/Hepatic Veins:   The inferior vena cava was not visualized.  Aorta:   The aortic root is of normal  size.  Pericardium/Pleural space:   Normal pericardium with no pericardial effusion.    Electronically signed by: MD Thomos Lemons on 05/28/2021 12:14 PM              Assessment/ Plan:   Active Hospital Problems    Diagnosis   . Primary Problem: Mediastinal mass       Marilla Rheams is a 70 y.o., White female with medical history of CAD, HFrEF, COPD and chronic hypoxic respiratory failure on 3L NC at home who presented to [facility] due to having SOB and hoarseness for the last few days found to have COPD exacerbation and mediastinal mass.    Acute COPD exacerbation, improving  Chronic hypoxic respiratory failure  - Duonebs schedule with albuterol PRN  - Pulmonary toilet  - Symbicort BID  - Prednisone 40 mg daily for 5 days  - Doxycycline 40 mg daily for 5 days  - Continue with 3L NC with goal SpO2 88%+    Mediastinal mass and hoarseness  - thoracic surgery following, tentative plan for VATS 11/29  - CT scan images are uploaded on [employer]  - LDH 229, HCG 10  - myasthenia panel pending    Thrush   -  nystatin swish and swallow x 14 days    Acute urinary retention  - check UA, urine culture pending  - bladder scan TID   - straight cath x1     CAD  HFrEF  - Continue Lipitor  - hold ASA  - TTE with EF of 65%. Technically difficult study due to limited acoustic windows.     Mood disorder  - Continue lexapro    GERD  - continue PPI    Body mass index is 28.84 kg/m.    PT/OT: ordered    Consults: thoracic surgery    Hardware (lines, foley's, tubes):  Patient Lines/Drains/Airways Status     Active Line / Dialysis Catheter / Dialysis Graft / Drain / Airway / Wound     Name Placement date Placement time Site Days    Peripheral IV Left;Posterior Dorsal Metacarpals  (top of hand) 05/27/21  --  -- 3                 DVT/PE Prophylaxis: Lovenox    Diet: Regular    Disposition Planning: TBD    Code Status: Full Code       Total visit time was 25 minutes. I spent greater than 50% of the time counseling the  patient face-to-face regarding the diagnosis and management plan and/or on the unit coordinating the patient's care. The coordination of care involved services directly related to patient care, including reviewing data, obtaining relevant patient information, and discussing the case with other involved healthcare providers.     Carrolyn Leigh, DO  Pager [phone]

## 2021-05-30 NOTE — Progress Notes (Signed)
[facility]  Thoracic Surgery   Progress Note      [place], Zarzycki  Date of Admission:  05/28/2021  Date of Birth:  October 22, 1950    Hospital Day:  LOS: 2 days   Date of Service:  05/30/2021    Subjective   No acute events overnight. Breathing stable.    Objective      Vital Signs:  Temp (24hrs) Max:37.4 C (99.3 F)      Temperature: 36.6 C (97.9 F)  BP (Non-Invasive): 122/60  MAP (Non-Invasive): 70 mmHG  Heart Rate: 58  Respiratory Rate: 16  SpO2: 96 %    Base (Admission) Weight:  Weight: 76.2 kg (167 lb 15.9 oz)  Weight:  Weight: 76.2 kg (167 lb 15.9 oz)      Current Medications:  acetaminophen (TYLENOL) tablet, 650 mg, Oral, Q4H PRN  albuterol (PROVENTIL) 2.5 mg / 3 mL (0.083%) neb solution, 2.5 mg, Nebulization, Q4H PRN  aspirin chewable tablet 81 mg, 81 mg, Oral, Daily  atorvastatin (LIPITOR) tablet, 40 mg, Oral, QPM  budesonide-formoterol (SYMBICORT) 160 mcg-4.5 mcg per inhalation oral inhaler - "Respiratory to administer", 4 Puff, Inhalation, 2x/day  calcium carbonate (TUMS) 500mg  (200mg  elemental calcium) chewable tablet, 500 mg, Oral, 3x/day PRN  D5W 250 mL flush bag, , Intravenous, Q15 Min PRN  doxycycline tablet, 100 mg, Oral, 2x/day  enoxaparin PF (LOVENOX) 40 mg/0.4 mL SubQ injection, 40 mg, Subcutaneous, Daily  escitalopram (LEXAPRO) tablet, 20 mg, Oral, Daily  guaiFENesin 100mg  per 70mL oral liquid - for cough (expectorant), 200 mg, Oral, Q4H PRN  ipratropium-albuterol 0.5 mg-3 mg(2.5 mg base)/3 mL Solution for Nebulization, 3 mL, Nebulization, 4x/day  loratadine (CLARITIN) tablet, 10 mg, Oral, Daily  NS 250 mL flush bag, , Intravenous, Q15 Min PRN  NS flush syringe, 2-6 mL, Intracatheter, Q8HRS  NS flush syringe, 2-6 mL, Intracatheter, Q1 MIN PRN  nystatin (MYCOSTATIN) 100,000 units per mL oral liquid, 5 mL, Swish & Swallow, 4x/day  ondansetron (ZOFRAN) 2 mg/mL injection, 4 mg, Intravenous, Q6H PRN  pantoprazole (PROTONIX) delayed release tablet, 20 mg, Oral, Daily before  Breakfast  predniSONE (DELTASONE) tablet, 40 mg, Oral, Daily with Breakfast      Appropriate Home Meds restarted:   Per primary team    I/O:  I/O last 24 hours to current time:      Intake/Output Summary (Last 24 hours) at 05/30/2021 1142  Last data filed at 05/30/2021 0724  Gross per 24 hour   Intake 720 ml   Output 650 ml   Net 70 ml     I/O last 3 completed shifts:  11/27 0700 - 11/27 1859  In: 100 [P.O.:100]  Out: 0       In: 620 [P.O.:610; I.V.:10]  Out: 650 [Urine:650]    Nutrition/Diet:  DIET REGULAR  MNT PROTOCOL FOR DIETITIAN  DIETARY ORAL SUPPLEMENTS Oral Supplements with tray: Magic Cup-Orange; LUNCH/DINNER; 1 Each    Labs:  Reviewed:  I have reviewed all lab results.  Lab Results Today:    Results for orders placed or performed during the hospital encounter of 05/28/21 (from the past 24 hour(s))   BASIC METABOLIC PANEL - AM ONCE   Result Value Ref Range    SODIUM 136 136 - 145 mmol/L    POTASSIUM 4.3 3.5 - 5.1 mmol/L    CHLORIDE 92 (L) 96 - 111 mmol/L    CO2 TOTAL 34 (H) 23 - 31 mmol/L    ANION GAP 10 4 - 13 mmol/L    CALCIUM  9.2 8.8 - 10.2 mg/dL    GLUCOSE 814 (H) 65 - 125 mg/dL    BUN 34 (H) 8 - 25 mg/dL    CREATININE 4.81 8.56 - 1.05 mg/dL    BUN/CREA RATIO 37 (H) 6 - 22    ESTIMATED GFR 66 >=60 mL/min/BSA   CBC WITH DIFF   Result Value Ref Range    WBC 14.7 (H) 3.7 - 11.0 x10^3/uL    RBC 5.00 3.85 - 5.22 x10^6/uL    HGB 12.7 11.5 - 16.0 g/dL    HCT 31.4 97.0 - 26.3 %    MCV 85.0 78.0 - 100.0 fL    MCH 25.4 (L) 26.0 - 32.0 pg    MCHC 29.9 (L) 31.0 - 35.5 g/dL    RDW-CV 78.5 (H) 88.5 - 15.5 %    PLATELETS 155 150 - 400 x10^3/uL    MPV 11.4 8.7 - 12.5 fL    NEUTROPHIL % 91 %    LYMPHOCYTE % 3 %    MONOCYTE % 5 %    EOSINOPHIL % 0 %    BASOPHIL % 0 %    NEUTROPHIL # 13.45 (H) 1.50 - 7.70 x10^3/uL    LYMPHOCYTE # 0.37 (L) 1.00 - 4.80 x10^3/uL    MONOCYTE # 0.76 0.20 - 1.10 x10^3/uL    EOSINOPHIL # <0.10 <=0.50 x10^3/uL    BASOPHIL # <0.10 <=0.20 x10^3/uL    IMMATURE GRANULOCYTE % 1 0 - 1 %    IMMATURE  GRANULOCYTE # <0.10 <0.10 x10^3/uL   URINALYSIS, MACROSCOPIC   Result Value Ref Range    SPECIFIC GRAVITY 1.022 1.005 - 1.030    GLUCOSE Negative Negative mg/dL    PROTEIN 027 (A) Negative mg/dL    BILIRUBIN Negative Negative mg/dL    UROBILINOGEN Negative Negative mg/dL    PH 5.5 5.0 - 8.0    BLOOD Large (A) Negative mg/dL    KETONES Negative Negative mg/dL    NITRITE Negative Negative    LEUKOCYTES Negative Negative WBCs/uL    APPEARANCE Cloudy (A) Clear    COLOR Orange (A) Normal (Yellow)   URINALYSIS, MICROSCOPIC   Result Value Ref Range    WBCS 17.0 (H) <11.0 /hpf    RBCS >182.0 (H) <6.0 /hpf    BACTERIA Few (A) Occasional or less /hpf    YEAST Few (A) Occasional or less /hpf    MUCOUS Light Light /lpf   BASIC METABOLIC PANEL - AM ONCE   Result Value Ref Range    SODIUM 138 136 - 145 mmol/L    POTASSIUM 3.8 3.5 - 5.1 mmol/L    CHLORIDE 96 96 - 111 mmol/L    CO2 TOTAL 35 (H) 23 - 31 mmol/L    ANION GAP 7 4 - 13 mmol/L    CALCIUM 8.7 (L) 8.8 - 10.2 mg/dL    GLUCOSE 95 65 - 741 mg/dL    BUN 32 (H) 8 - 25 mg/dL    CREATININE 2.87 8.67 - 1.05 mg/dL    BUN/CREA RATIO 44 (H) 6 - 22    ESTIMATED GFR 89 >=60 mL/min/BSA   CBC WITH DIFF   Result Value Ref Range    WBC 13.9 (H) 3.7 - 11.0 x10^3/uL    RBC 4.55 3.85 - 5.22 x10^6/uL    HGB 11.5 11.5 - 16.0 g/dL    HCT 67.2 09.4 - 70.9 %    MCV 82.6 78.0 - 100.0 fL    MCH 25.3 (L) 26.0 - 32.0 pg  MCHC 30.6 (L) 31.0 - 35.5 g/dL    RDW-CV 45.8 (H) 59.2 - 15.5 %    PLATELETS 152 150 - 400 x10^3/uL    MPV 12.1 8.7 - 12.5 fL    NEUTROPHIL % 83 %    LYMPHOCYTE % 7 %    MONOCYTE % 10 %    EOSINOPHIL % 0 %    BASOPHIL % 0 %    NEUTROPHIL # 11.40 (H) 1.50 - 7.70 x10^3/uL    LYMPHOCYTE # 1.03 1.00 - 4.80 x10^3/uL    MONOCYTE # 1.35 (H) 0.20 - 1.10 x10^3/uL    EOSINOPHIL # <0.10 <=0.50 x10^3/uL    BASOPHIL # <0.10 <=0.20 x10^3/uL    IMMATURE GRANULOCYTE % 0 0 - 1 %    IMMATURE GRANULOCYTE # <0.10 <0.10 x10^3/uL       Radiology:    Reviewed:   No new imaging    Physical  Exam:  Constitutional:  no distress and vital signs reviewed  Eyes:  Conjunctiva clear.  ENT:  Mouth mucous membranes moist.   Neck:  supple, symmetrical, trachea midline  Respiratory:  No respiratory distress. On nasal cannula  Cardiovascular:  regular rate and rhythm  Musculoskeletal:  Head atraumatic and normocephalic  Integumentary:  Skin warm and dry  Neurologic:  Grossly normal          ASSESSMENT:  Mediastinal mass  COPD on nasal cannula at baseline    Problem List:  Active Hospital Problems    Diagnosis    Primary Problem: Mediastinal mass       PT/OT:  Per primary team    Disposition Planning:  Per primary team      PLAN:  -MG labs pending  -Tentative plan for OR Tuesday, 11/29, for mediastinal mass resection   -Please make patient NPO at 0015 on 11/29   -Please hold any morning DVT prophylaxis on 11/29   -Please hold aspirin if okay with primary team  -Rest of care per primary team  -Thank you for this consult. We will continue to follow.    I independently of the faculty provider spent a total of (10) minutes in direct/indirect care of this patient including initial evaluation, review of laboratory, radiology, diagnostic studies, review of medical record, order entry and coordination of care.      Thersa Salt, PA-C 05/30/2021. 11:42  I personally saw and evaluated the patient. See Irving Burton Neslon's note for additional details.   Cyndia Diver, MD

## 2021-05-30 NOTE — Nurses Notes (Signed)
Patient is A&O x4. VS stable. Fall precautions maintained. Patient denies any pain at this time. Patient negative for n/v and diarrhea. Assessment per Flowsheet. Patient has no other complaints at this time.

## 2021-05-30 NOTE — Nurses Notes (Signed)
Patient still has no urge to void.  Bladder scan performed for 141.  Last straight cath was documented at 1958.

## 2021-05-30 NOTE — Nurses Notes (Signed)
Patient is alert and oriented. No complaints of n/v/d or pain at this time. Patient verbalized that she sometimes has the urge to void, but just can't. Bladder scanned patient at 10:00 for 315. Medications reviewed and given. Assessment per flowsheet. Will continue to monitor.

## 2021-05-30 NOTE — Nurses Notes (Signed)
Patient bladder scanned at this time for 410. Will continue to monitor.

## 2021-05-31 DIAGNOSIS — B370 Candidal stomatitis: Secondary | ICD-10-CM

## 2021-05-31 DIAGNOSIS — I5020 Unspecified systolic (congestive) heart failure: Secondary | ICD-10-CM

## 2021-05-31 LAB — CBC WITH DIFF
BASOPHIL #: <0.1 10*3/uL (ref <=0.20)
BASOPHIL %: 0 %
EOSINOPHIL #: <0.1 10*3/uL (ref <=0.50)
EOSINOPHIL %: 0 %
HCT: 35.7 % (ref 34.8–46.0)
HGB: 11.1 g/dL — ABNORMAL LOW (ref 11.5–16.0)
IMMATURE GRANULOCYTE #: <0.1 10*3/uL (ref <0.10)
IMMATURE GRANULOCYTE %: 1 % (ref 0–1)
LYMPHOCYTE #: 1.23 10*3/uL (ref 1.00–4.80)
LYMPHOCYTE %: 12 %
MCH: 25.6 pg — ABNORMAL LOW (ref 26.0–32.0)
MCHC: 31.1 g/dL (ref 31.0–35.5)
MCV: 82.3 fL (ref 78.0–100.0)
MONOCYTE #: 1.06 10*3/uL (ref 0.20–1.10)
MONOCYTE %: 10 %
MPV: 11.4 fL (ref 8.7–12.5)
NEUTROPHIL #: 8.18 10*3/uL — ABNORMAL HIGH (ref 1.50–7.70)
NEUTROPHIL %: 77 %
PLATELETS: 147 10*3/uL — ABNORMAL LOW (ref 150–400)
RBC: 4.34 10*6/uL (ref 3.85–5.22)
RDW-CV: 16.4 % — ABNORMAL HIGH (ref 11.5–15.5)
WBC: 10.6 10*3/uL (ref 3.7–11.0)

## 2021-05-31 LAB — BASIC METABOLIC PANEL
ANION GAP: 5 mmol/L (ref 4–13)
BUN/CREA RATIO: 46 — ABNORMAL HIGH (ref 6–22)
BUN: 31 mg/dL — ABNORMAL HIGH (ref 8–25)
CALCIUM: 8.6 mg/dL — ABNORMAL LOW (ref 8.8–10.2)
CHLORIDE: 93 mmol/L — ABNORMAL LOW (ref 96–111)
CO2 TOTAL: 35 mmol/L — ABNORMAL HIGH (ref 23–31)
CREATININE: 0.67 mg/dL (ref 0.60–1.05)
ESTIMATED GFR: >90 mL/min/BSA (ref >=60)
GLUCOSE: 94 mg/dL (ref 65–125)
POTASSIUM: 3.3 mmol/L — ABNORMAL LOW (ref 3.5–5.1)
SODIUM: 133 mmol/L — ABNORMAL LOW (ref 136–145)

## 2021-05-31 LAB — URINE CULTURE,ROUTINE: URINE CULTURE: >100000 — AB

## 2021-05-31 MED ORDER — POTASSIUM BICARBONATE-CITRIC ACID 20 MEQ EFFERVESCENT TABLET
40.0000 meq | EFFERVESCENT_TABLET | Freq: Once | ORAL | Status: AC
Start: 2021-05-31 — End: 2021-05-31
  Administered 2021-05-31: 09:00:00 40 meq via ORAL
  Filled 2021-05-31: qty 2

## 2021-05-31 MED ORDER — BUDESONIDE-FORMOTEROL HFA 160 MCG-4.5 MCG/ACTUATION AEROSOL INHALER - RN
2.0000 | Freq: Two times a day (BID) | Status: DC
Start: 2021-05-31 — End: 2021-06-08
  Administered 2021-05-31 – 2021-06-08 (×16): 2 via RESPIRATORY_TRACT

## 2021-05-31 NOTE — Progress Notes (Addendum)
[facility]  Medicine Progress Note    Misty Johnson  Date of service: 05/31/2021  Date of Admission:  05/28/2021    Hospital Day:  LOS: 3 days       Chief Complaint: F/U SOB, Hoarsness     Subjective:   No acute events overnight. Patient without acute complaints. She has questions about her procedure. Remains on baseline O2. Daughter updated via phone call.     Vital Signs:  Temp  Avg: 36.7 C (98.1 F)  Min: 36.3 C (97.3 F)  Max: 37 C (98.6 F)    Pulse  Avg: 59.6  Min: 55  Max: 65 BP  Min: 118/46  Max: 140/64   Resp  Avg: 16  Min: 16  Max: 16 SpO2  Avg: 97.4 %  Min: 95 %  Max: 100 %          Input/Output    Intake/Output Summary (Last 24 hours) at 05/31/2021 1641  Last data filed at 05/31/2021 1336  Gross per 24 hour   Intake 1024 ml   Output 950 ml   Net 74 ml    I/O last shift:  11/28 0700 - 11/28 1859  In: 544 [P.O.:544]  Out: 200 [Urine:200]   acetaminophen (TYLENOL) tablet, 650 mg, Oral, Q4H PRN  albuterol (PROVENTIL) 2.5 mg / 3 mL (0.083%) neb solution, 2.5 mg, Nebulization, Q4H PRN  [Held by provider] aspirin chewable tablet 81 mg, 81 mg, Oral, Daily  atorvastatin (LIPITOR) tablet, 40 mg, Oral, QPM  budesonide-formoterol (SYMBICORT) 160 mcg-4.5 mcg per inhalation oral inhaler - "Nursing to administer", 2 Puff, Inhalation, 2x/day  calcium carbonate (TUMS) 500mg  (200mg  elemental calcium) chewable tablet, 500 mg, Oral, 3x/day PRN  D5W 250 mL flush bag, , Intravenous, Q15 Min PRN  doxycycline tablet, 100 mg, Oral, 2x/day  [Held by provider] enoxaparin PF (LOVENOX) 40 mg/0.4 mL SubQ injection, 40 mg, Subcutaneous, Daily  escitalopram (LEXAPRO) tablet, 20 mg, Oral, Daily  guaiFENesin 100mg  per 22mL oral liquid - for cough (expectorant), 200 mg, Oral, Q4H PRN  loratadine (CLARITIN) tablet, 10 mg, Oral, Daily  NS 250 mL flush bag, , Intravenous, Q15 Min PRN  NS flush syringe, 2-6 mL, Intracatheter, Q8HRS  NS flush syringe, 2-6 mL, Intracatheter, Q1 MIN PRN  nystatin (MYCOSTATIN) 100,000 units per mL  oral liquid, 5 mL, Swish & Swallow, 4x/day  ondansetron (ZOFRAN) 2 mg/mL injection, 4 mg, Intravenous, Q6H PRN  pantoprazole (PROTONIX) delayed release tablet, 20 mg, Oral, Daily before Breakfast  predniSONE (DELTASONE) tablet, 40 mg, Oral, Daily with Breakfast          Physical Exam  BP (!) 118/46 Comment: rn notified  Pulse 55   Temp 36.3 C (97.3 F)   Resp 16   Ht 1.626 m (5\' 4" )   Wt 76.2 kg (167 lb 15.9 oz)   SpO2 95%   BMI 28.84 kg/m       Constitutional: Alert and oriented x 3, NAD, appears chronically ill.   Eyes: Conjunctiva clear. Pupils equal and round. Sclera non-icteric.   ENT: Posterior oropharynx without exudate or erythema, mucous membranes moist. Thrush continues to be improving. Voice is hoarse  Neck: Supple. Trachea midline. No JVD.   Respiratory: Clear to auscultation bilaterally. Normal effort.   Cardiovascular: regular rate and rhythm, S1, S2 normal. No murmur, click, rub or gallop  Gastrointestinal: Soft, non-tender, non distended. Bowel sounds normal in all 4 quadrants.   Genitourinary: Deferred  Musculoskeletal: Normal muscle tone and bulk. No edema, cynaosis, clubbing.  Integumentary:  Skin warm and dry. No rashes or lesions  Neurologic: Grossly non focal. Moves all 4 extremities.  Lymphatic/Immunologic/Hematologic: No lymphadenopathy. No ecchymosis or petechiae.   Psychiatric: Normal affect, mood, and speech.    Labs:    CBC  Diff   Lab Results   Component Value Date/Time    WBC 10.6 05/31/2021 04:50 AM    HGB 11.1 (L) 05/31/2021 04:50 AM    HCT 35.7 05/31/2021 04:50 AM    PLTCNT 147 (L) 05/31/2021 04:50 AM    RBC 4.34 05/31/2021 04:50 AM    MCV 82.3 05/31/2021 04:50 AM    MCHC 31.1 05/31/2021 04:50 AM    MCH 25.6 (L) 05/31/2021 04:50 AM    MPV 11.4 05/31/2021 04:50 AM    Lab Results   Component Value Date/Time    PMNS 77 05/31/2021 04:50 AM    MONOCYTES 10 05/31/2021 04:50 AM    BASOPHILS 0 05/31/2021 04:50 AM    BASOPHILS <0.10 05/31/2021 04:50 AM    PMNABS 8.18 (H) 05/31/2021  04:50 AM    LYMPHSABS 1.23 05/31/2021 04:50 AM    EOSABS <0.10 05/31/2021 04:50 AM    MONOSABS 1.06 05/31/2021 04:50 AM          Comprehensive Metabolic Profile    Lab Results   Component Value Date/Time    SODIUM 133 (L) 05/31/2021 04:50 AM    POTASSIUM 3.3 (L) 05/31/2021 04:50 AM    CHLORIDE 93 (L) 05/31/2021 04:50 AM    CO2 35 (H) 05/31/2021 04:50 AM    ANIONGAP 5 05/31/2021 04:50 AM    BUN 31 (H) 05/31/2021 04:50 AM    CREATININE 0.67 05/31/2021 04:50 AM    GLUCOSE Negative 05/29/2021 07:53 PM    Lab Results   Component Value Date/Time    CALCIUM 8.6 (L) 05/31/2021 04:50 AM    PHOSPHORUS 4.9 (H) 05/28/2021 07:10 AM    ALBUMIN 3.4 05/28/2021 07:10 AM    TOTALPROTEIN 6.3 05/28/2021 07:10 AM    ALKPHOS 73 05/28/2021 07:10 AM    AST 20 05/28/2021 07:10 AM    ALT 18 05/28/2021 07:10 AM    TOTBILIRUBIN 0.9 05/28/2021 07:10 AM        CARDIAC MARKERS  Lab Results   Component Value Date    TROPONINI 18 05/28/2021            Radiology:   Last CXR Impression   XR CHEST AP AND LATERAL (Exam End: 05/28/2021  8:51 AM)    Impression    Mild pulmonary edema pattern.       Results for orders placed during the hospital encounter of 05/28/21    TRANSTHORACIC ECHOCARDIOGRAM - ADULT 05/28/2021 12:14 PM    Narrative  **See full report in linked PDF document**    Transthoracic Echocardiographic Report    ______________________________________________________________________________  Name: KARSYNN, DEWEESE                         MRN: [id number]               Weight: 168 lb  Study Date: 05/28/2021 08:14 AM              DOB: [birth year]             Height: 64 in  Gender: Female  Age: 70 yrs                 BSA: 1.8 m2  Accession #: [id number]                    BP: 138/82 mmHg  Patient Location: [facility]  Ordering Provider: Adline Mango  Tech: [id number]    ______________________________________________________________________________  Procedure:  Transthoracic complete echo with contrast, 2D, spectral and  tissue Doppler, color flow Doppler, M-mode.    Quality:  Technically difficult study due to limited acoustic windows.    Indications: Dyspnea,Respiratory failure (CMS HCC),Heart failure (CMS HCC),CAD (coronary artery disease)    Conclusions:  Left Ventricle: Normal left ventricular size. The left ventricular ejection fraction by visual assessment is estimated to be 65%. Regional wall motion abnormalities cannot be excluded due to limited  visualization. Left ventricular diastolic function is indeterminate.  Right Ventricle: Normal right ventricular size. Normal right ventricular systolic function.  Left Atrium: The left atrium is normal in size.  Right Atrium: The right atrium is of normal size.    Findings  Left Ventricle:   Normal left ventricular size. The left ventricular ejection fraction by visual assessment is estimated to be 65%. Left ventricular systolic function is normal. Regional wall motion  abnormalities cannot be excluded due to limited visualization. Left ventricular diastolic function is indeterminate.  Right Ventricle:   Normal right ventricular size. Normal right ventricular systolic function. Right ventricular systolic pressure is indeterminate.  Left Atrium:   The left atrium is normal in size.  Right Atrium:   The right atrium is of normal size.  Mitral Valve:   The mitral valve is not well visualized. No significant mitral regurgitation present.  Tricuspid Valve:   The tricuspid valve is not well visualized. No significant tricuspid regurgitation present.  Aortic Valve:   The aortic valve is not well visualized. No Aortic valve stenosis. No significant aortic regurgitation present.  Pulmonic Valve:   The pulmonic valve is not well visualized.  Pulmonary Artery:   The pulmonary artery is not well visualized.  Atrial Septum:   The interatrial septum is not well visualized.  IVC/Hepatic Veins:   The inferior vena cava was not visualized.  Aorta:   The aortic root is of normal  size.  Pericardium/Pleural space:   Normal pericardium with no pericardial effusion.    Electronically signed by: MD Thomos Lemons on 05/28/2021 12:14 PM              Assessment/ Plan:   Active Hospital Problems    Diagnosis   . Primary Problem: Mediastinal mass       Theresea Trautmann is a 69 y.o., White female with medical history of CAD, HFrEF, COPD and chronic hypoxic respiratory failure on 3L NC at home who presented to [facility] due to having SOB and hoarseness for the last few days found to have COPD exacerbation and mediastinal mass.    Acute COPD exacerbation, improving  Chronic hypoxic respiratory failure  - Duonebs schedule with albuterol PRN  - Pulmonary toilet  - Symbicort BID  - Prednisone 40 mg daily for 5 days (end date 11/29)  - Doxycycline 40 mg daily for 5 days (end date 11/29)  - Continue with 3L NC with goal SpO2 88%+    Mediastinal mass and hoarseness  - thoracic surgery following, tentative plan for VATS tomorrow  - CT scan images are uploaded on [employer]  - LDH 229, HCG 10  - myasthenia  panel pending    Thrush   - nystatin swish and swallow x 14 days    Acute urinary retention  - check UA, urine culture pending  - bladder scan TID   - straight cath x2    CAD  HFrEF  - Continue Lipitor  - hold ASA  - TTE with EF of 65%. Technically difficult study due to limited acoustic windows.     Mood disorder  - Continue lexapro    GERD  - continue PPI    Body mass index is 28.84 kg/m.    PT/OT: ordered    Consults: thoracic surgery    Hardware (lines, foley's, tubes):  Patient Lines/Drains/Airways Status     Active Line / Dialysis Catheter / Dialysis Graft / Drain / Airway / Wound     Name Placement date Placement time Site Days    Peripheral IV Left;Posterior Dorsal Metacarpals  (top of hand) 05/27/21  --  -- 4    Foley Catheter 05/30/21  2235  -- less than 1                 DVT/PE Prophylaxis: Lovenox (on hold)    Diet: Regular, NPO after midnight    Disposition Planning:  TBD    Code Status: Full Code       Total visit time was 25 minutes. I spent greater than 50% of the time counseling the patient face-to-face regarding the diagnosis and management plan and/or on the unit coordinating the patient's care. The coordination of care involved services directly related to patient care, including reviewing data, obtaining relevant patient information, and discussing the case with other involved healthcare providers.     Carrolyn Leigh, DO  Pager [phone]

## 2021-05-31 NOTE — Progress Notes (Signed)
[facility]  Thoracic Surgery   Progress Note      Misty Johnson, Misty Johnson  Date of Admission:  05/28/2021  Date of Birth:  03/16/1951    Hospital Day:  LOS: 3 days   Date of Service:  05/31/2021    Subjective   No acute events overnight. Breathing stable. Resting comfortably in bed in no acute distress.    Objective      Vital Signs:  Temp (24hrs) Max:37 C (98.6 F)      Temperature: 36.3 C (97.3 F)  BP (Non-Invasive): (!) 118/46 (rn notified)  MAP (Non-Invasive): 66 mmHG  Heart Rate: 55  Respiratory Rate: 16  SpO2: 95 %    Base (Admission) Weight:  Weight: 76.2 kg (167 lb 15.9 oz)  Weight:  Weight: 76.2 kg (167 lb 15.9 oz)      Current Medications:  acetaminophen (TYLENOL) tablet, 650 mg, Oral, Q4H PRN  albuterol (PROVENTIL) 2.5 mg / 3 mL (0.083%) neb solution, 2.5 mg, Nebulization, Q4H PRN  [Held by provider] aspirin chewable tablet 81 mg, 81 mg, Oral, Daily  atorvastatin (LIPITOR) tablet, 40 mg, Oral, QPM  budesonide-formoterol (SYMBICORT) 160 mcg-4.5 mcg per inhalation oral inhaler - "Nursing to administer", 2 Puff, Inhalation, 2x/day  calcium carbonate (TUMS) 500mg  (200mg  elemental calcium) chewable tablet, 500 mg, Oral, 3x/day PRN  D5W 250 mL flush bag, , Intravenous, Q15 Min PRN  doxycycline tablet, 100 mg, Oral, 2x/day  enoxaparin PF (LOVENOX) 40 mg/0.4 mL SubQ injection, 40 mg, Subcutaneous, Daily  escitalopram (LEXAPRO) tablet, 20 mg, Oral, Daily  guaiFENesin 100mg  per 4mL oral liquid - for cough (expectorant), 200 mg, Oral, Q4H PRN  loratadine (CLARITIN) tablet, 10 mg, Oral, Daily  NS 250 mL flush bag, , Intravenous, Q15 Min PRN  NS flush syringe, 2-6 mL, Intracatheter, Q8HRS  NS flush syringe, 2-6 mL, Intracatheter, Q1 MIN PRN  nystatin (MYCOSTATIN) 100,000 units per mL oral liquid, 5 mL, Swish & Swallow, 4x/day  ondansetron (ZOFRAN) 2 mg/mL injection, 4 mg, Intravenous, Q6H PRN  pantoprazole (PROTONIX) delayed release tablet, 20 mg, Oral, Daily before Breakfast  predniSONE (DELTASONE) tablet, 40  mg, Oral, Daily with Breakfast      Appropriate Home Meds restarted:   Per primary team    I/O:  I/O last 24 hours to current time:      Intake/Output Summary (Last 24 hours) at 05/31/2021 1443  Last data filed at 05/31/2021 1336  Gross per 24 hour   Intake 1304 ml   Output 950 ml   Net 354 ml     I/O last 3 completed shifts:  11/28 0700 - 11/28 1859  In: 544 [P.O.:544]  Out: 200 [Urine:200]      In: 860 [P.O.:860]  Out: 750 [Urine:750]    Nutrition/Diet:  DIET REGULAR  MNT PROTOCOL FOR DIETITIAN  DIETARY ORAL SUPPLEMENTS Oral Supplements with tray: Magic Cup-Orange; LUNCH/DINNER; 1 Each  ROOM SERVICE:  NEEDS VISIT FOR MENU CHOICES    Labs:  Reviewed:  I have reviewed all lab results.  Lab Results Today:    CBC with Diff (Last 24 Hours):    Recent Results last 24 hours     05/31/21  0450   WBC 10.6   HGB 11.1*   HCT 35.7   MCV 82.3   PLTCNT 147*   PMNS 77   LYMPHO 12   MONOCYTES 10   EOSINO 0   BASOPHILS 0  <0.10         Radiology:    Reviewed:  No new imaging    Physical Exam:  Constitutional:  no distress and vital signs reviewed  Eyes:  Conjunctiva clear.  ENT:  Mouth mucous membranes moist.   Neck:  supple, symmetrical, trachea midline  Respiratory:  No respiratory distress. On nasal cannula  Cardiovascular:  regular rate and rhythm  Musculoskeletal:  Head atraumatic and normocephalic  Integumentary:  Skin warm and dry  Neurologic:  Grossly normal          ASSESSMENT:  Mediastinal mass  COPD on nasal cannula at baseline    Problem List:  Active Hospital Problems    Diagnosis    Primary Problem: Mediastinal mass       PT/OT:  Per primary team    Disposition Planning:  Per primary team      PLAN:  -MG labs pending  -Tentative plan for OR Tuesday, 11/29, for mediastinal mass resection   -Please make patient NPO at 0015 on 11/29   -Please hold any morning DVT prophylaxis on 11/29   -Please hold aspirin if okay with primary team  -Rest of care per primary team  -Thank you for this consult. We will continue to  follow.    I independently of the faculty provider spent a total of (10) minutes in direct/indirect care of this patient including initial evaluation, review of laboratory, radiology, diagnostic studies, review of medical record, order entry and coordination of care.    Jenel Lucks, PA-C  05/31/2021, 14:43    I personally saw and evaluated the patient. See [name] note for additional details.   Cyndia Diver, MD

## 2021-05-31 NOTE — Care Management Notes (Signed)
[facility] Management Note    Patient Name: Misty Johnson  Date of Birth: 09-Jul-1950  Sex: female  Date/Time of Admission: 05/28/2021  4:57 AM  Room/Bed: 904/A  Payor: MEDICARE / Plan: MEDICARE PART A AND B / Product Type: Medicare /    LOS: 3 days   Primary Care Providers:  Misty Retort, FNP-C, FNP-C (General)    Admitting Diagnosis:  Mediastinal mass [J98.59]    Assessment:      05/31/21 1811   Assessment Details   Assessment Type Admission   Date of Care Management Update 05/31/21   Date of Next DCP Update 06/03/21   Insurance Information/Type   Insurance type Medicare   Employment/Financial   Patient has Prescription Coverage?  Yes        Name of Insurance Coverage for Medications Medicare/[employer] an age group to open "lives with" row.  Adult   Lives With child(ren), adult   Living Arrangements house   Able to Return to Prior Arrangements yes   Living Arrangement Comments lives in home, 2 floors but enters on main floor (6-7 steps in front, 3 steps in back- Railings on both sides of house (one railing). has basement they do not use; lives with adult son.   Home Safety   Home Accessibility bed and bath on same level;stairs to enter home;stairs (1 railing present);stairs within home   Custody and Legal Status   Do you have a court appointed guardian/conservator? No   Care Management Plan   Discharge Planning Status initial meeting   Projected Discharge Date 06/04/21   CM will evaluate for rehabilitation potential yes   Discharge Needs Assessment   Equipment Currently Used at Home cane, straight;oxygen;nebulizer;walker, front wheeled   Equipment Needed After Discharge none  (TBD)   Discharge Facility/Level of Care Needs Home vs Home with Home Health   Transportation Available family or friend will provide   Referral Information   Admission Type inpatient   Address Verified verified-no changes   Arrived From home or self-care    ADVANCE DIRECTIVES   Does the Patient have an Advance Directive? No, Information Offered and Refused   Patient Hand-Off   Clinical/Discharge Plan of Care Information Communicated to:  Medical Social Worker   Comments handoff to [employer].     Met with patient. She reported daughter, Misty Johnson is MPOA. Asked her to have Misty Johnson Bring copy tomorrow if possible. Patient agreed. States no current HH and could not remember past agency(ies). Also could not remember DME provider (had current home O2). Pt has home O2 (states 3L) and nebulizer, cane, walking stick, and FWW. Discussed that care management assists with discharge plan whether that is home with HH/DME or to a rehab. Patient indicated that she plans to go home on discharge.   Lives with adult son.     Did call to confirm above with Misty Johnson. Misty Johnson indicates she and patient working on financial POA but no medical currently. Advised her if she and patient wish [place] to assist with these forms when both are here we could do this. Advised her that family would need to bring patient's portables up here to get her back home whenever discharge. Daughter indicate patient has 4 portables. Patient lives approx. 4 hrs from [place].   Daughter was able to provide name of home O2 provider-States O[facility] through [facility].   Patients's home pharmacy is [facility]  in [place], [place].   Primary care confirmed- Misty Retort, FNP-C.     Discharge Plan:  Home (Patient/Family Member/other) (code 1), Undetermined at this time      The patient will continue to be evaluated for developing discharge needs.     Case Manager: Misty Johnson, MSW  Phone: [phone]

## 2021-06-01 ENCOUNTER — Inpatient Hospital Stay (HOSPITAL_COMMUNITY): Payer: Medicare Other | Admitting: Student in an Organized Health Care Education/Training Program

## 2021-06-01 ENCOUNTER — Encounter (HOSPITAL_COMMUNITY): Payer: Self-pay | Admitting: Student in an Organized Health Care Education/Training Program

## 2021-06-01 ENCOUNTER — Inpatient Hospital Stay (HOSPITAL_COMMUNITY): Payer: Medicare Other

## 2021-06-01 ENCOUNTER — Encounter (HOSPITAL_COMMUNITY)
Admission: AD | Disposition: A | Discharge status: Rehabilitation Facility | Payer: Self-pay | Source: Other Acute Inpatient Hospital | Attending: Internal Medicine

## 2021-06-01 DIAGNOSIS — J9811 Atelectasis: Secondary | ICD-10-CM

## 2021-06-01 DIAGNOSIS — Z978 Presence of other specified devices: Secondary | ICD-10-CM

## 2021-06-01 DIAGNOSIS — J9859 Other diseases of mediastinum, not elsewhere classified: Secondary | ICD-10-CM

## 2021-06-01 DIAGNOSIS — J90 Pleural effusion, not elsewhere classified: Secondary | ICD-10-CM

## 2021-06-01 DIAGNOSIS — Z9889 Other specified postprocedural states: Secondary | ICD-10-CM

## 2021-06-01 LAB — POC BLOOD GLUCOSE (RESULTS): GLUCOSE, POC: 89 mg/dl (ref 70–105)

## 2021-06-01 LAB — ABO & RH: ABO/RH(D): B NEG

## 2021-06-01 SURGERY — EXCISION MASS MEDIASTINAL ROBOTIC
Anesthesia: General | Site: Chest | Laterality: Right | Wound class: Clean Wound: Uninfected operative wounds in which no inflammation occurred

## 2021-06-01 MED ORDER — ROCURONIUM 10 MG/ML INTRAVENOUS SOLUTION
Freq: Once | INTRAVENOUS | Status: DC | PRN
Start: 2021-06-01 — End: 2021-06-01
  Administered 2021-06-01: 10 mg via INTRAVENOUS
  Administered 2021-06-01: 20 mg via INTRAVENOUS
  Administered 2021-06-01: 10 mg via INTRAVENOUS

## 2021-06-01 MED ORDER — DEXAMETHASONE SODIUM PHOSPHATE 4 MG/ML INJECTION SOLUTION
Freq: Once | INTRAMUSCULAR | Status: DC | PRN
Start: 2021-06-01 — End: 2021-06-01
  Administered 2021-06-01 (×2): 4 mg via INTRAVENOUS

## 2021-06-01 MED ORDER — DEXAMETHASONE SODIUM PHOSPHATE 4 MG/ML INJECTION SOLUTION
INTRAMUSCULAR | Status: AC
Start: 2021-06-01 — End: 2021-06-01
  Filled 2021-06-01: qty 1

## 2021-06-01 MED ORDER — DEXAMETHASONE SODIUM PHOSPHATE (PF) 10 MG/ML INJECTION SOLUTION
INTRAMUSCULAR | Status: AC
Start: 2021-06-01 — End: 2021-06-01
  Filled 2021-06-01: qty 1

## 2021-06-01 MED ORDER — SODIUM CHLORIDE 0.9 % (FLUSH) INJECTION SYRINGE
2.0000 mL | INJECTION | INTRAMUSCULAR | Status: DC | PRN
Start: 2021-06-01 — End: 2021-06-08

## 2021-06-01 MED ORDER — SODIUM CHLORIDE 0.9% FLUSH BAG - 250 ML
INTRAVENOUS | Status: DC | PRN
Start: 2021-06-01 — End: 2021-06-08

## 2021-06-01 MED ORDER — ONDANSETRON HCL (PF) 4 MG/2 ML INJECTION SOLUTION
INTRAMUSCULAR | Status: AC
Start: 2021-06-01 — End: 2021-06-01
  Filled 2021-06-01: qty 2

## 2021-06-01 MED ORDER — SUGAMMADEX 100 MG/ML INTRAVENOUS SOLUTION
Freq: Once | INTRAVENOUS | Status: DC | PRN
Start: 2021-06-01 — End: 2021-06-01
  Administered 2021-06-01: 200 mg via INTRAVENOUS

## 2021-06-01 MED ORDER — SUGAMMADEX 100 MG/ML INTRAVENOUS SOLUTION
INTRAVENOUS | Status: AC
Start: 2021-06-01 — End: 2021-06-01
  Filled 2021-06-01: qty 2

## 2021-06-01 MED ORDER — HYDROMORPHONE 1 MG/ML INJECTION WRAPPER
0.4000 mg | INJECTION | INTRAMUSCULAR | Status: DC | PRN
Start: 2021-06-01 — End: 2021-06-01

## 2021-06-01 MED ORDER — SODIUM CHLORIDE 0.9 % INTRAVENOUS SOLUTION
Freq: Once | Status: DC | PRN
Start: 2021-06-01 — End: 2021-06-01

## 2021-06-01 MED ORDER — DEXAMETHASONE SODIUM PHOSPHATE (PF) 10 MG/ML INJECTION SOLUTION
4.0000 mg | Freq: Once | INTRAMUSCULAR | Status: DC | PRN
Start: 2021-06-01 — End: 2021-06-01

## 2021-06-01 MED ORDER — FENTANYL (PF) 50 MCG/ML INJECTION SOLUTION
Freq: Once | INTRAMUSCULAR | Status: DC | PRN
Start: 2021-06-01 — End: 2021-06-01
  Administered 2021-06-01: 100 ug via INTRAVENOUS

## 2021-06-01 MED ORDER — FENTANYL (PF) 50 MCG/ML INJECTION SOLUTION
INTRAMUSCULAR | Status: AC
Start: 2021-06-01 — End: 2021-06-01
  Filled 2021-06-01: qty 2

## 2021-06-01 MED ORDER — ONDANSETRON HCL (PF) 4 MG/2 ML INJECTION SOLUTION
4.0000 mg | Freq: Once | INTRAMUSCULAR | Status: DC | PRN
Start: 2021-06-01 — End: 2021-06-01

## 2021-06-01 MED ORDER — POTASSIUM BICARBONATE-CITRIC ACID 20 MEQ EFFERVESCENT TABLET
40.0000 meq | EFFERVESCENT_TABLET | Freq: Once | ORAL | Status: AC
Start: 2021-06-01 — End: 2021-06-01
  Administered 2021-06-01: 08:00:00 40 meq via ORAL
  Filled 2021-06-01: qty 2

## 2021-06-01 MED ORDER — PHENYLEPHRINE 1 MG/10 ML (100 MCG/ML) IN 0.9 % SOD.CHLORIDE IV SYRINGE
INJECTION | INTRAVENOUS | Status: AC
Start: 2021-06-01 — End: 2021-06-01
  Filled 2021-06-01: qty 10

## 2021-06-01 MED ORDER — BUPIVACAINE LIPOSOME(PF) 1.3 %(13.3 MG/ML) SUSPENSION FOR INFILTRATION
Status: AC
Start: 2021-06-01 — End: 2021-06-01
  Filled 2021-06-01: qty 20

## 2021-06-01 MED ORDER — ELECTROLYTE-A INTRAVENOUS SOLUTION
INTRAVENOUS | Status: DC | PRN
Start: 2021-06-01 — End: 2021-06-01

## 2021-06-01 MED ORDER — PCA - MORPHINE 1 MG/ML IN NS
INJECTION | INTRAVENOUS | Status: DC
Start: 2021-06-01 — End: 2021-06-02
  Filled 2021-06-01: qty 100

## 2021-06-01 MED ORDER — SUCCINYLCHOLINE 20 MG/ML INTRAVENOUS WRAPPER
INJECTION | INTRAVENOUS | Status: AC
Start: 2021-06-01 — End: 2021-06-01
  Filled 2021-06-01: qty 10

## 2021-06-01 MED ORDER — LACTATED RINGERS INTRAVENOUS SOLUTION
INTRAVENOUS | Status: DC
Start: 2021-06-01 — End: 2021-06-01

## 2021-06-01 MED ORDER — SUCCINYLCHOLINE 20 MG/ML INTRAVENOUS WRAPPER
INJECTION | Freq: Once | INTRAVENOUS | Status: DC | PRN
Start: 2021-06-01 — End: 2021-06-01
  Administered 2021-06-01: 150 mg via INTRAVENOUS

## 2021-06-01 MED ORDER — DEXTROSE 5 % IN WATER (D5W) INTRAVENOUS SOLUTION
2.0000 g | Freq: Once | INTRAVENOUS | Status: AC
Start: 2021-06-01 — End: 2021-06-01
  Administered 2021-06-01: 2 g via INTRAVENOUS
  Filled 2021-06-01: qty 20

## 2021-06-01 MED ORDER — PROCHLORPERAZINE EDISYLATE 10 MG/2 ML (5 MG/ML) INJECTION SOLUTION
5.0000 mg | Freq: Once | INTRAMUSCULAR | Status: DC | PRN
Start: 2021-06-01 — End: 2021-06-01

## 2021-06-01 MED ORDER — FENTANYL (PF) 50 MCG/ML INJECTION SOLUTION
25.0000 ug | INTRAMUSCULAR | Status: DC | PRN
Start: 2021-06-01 — End: 2021-06-01
  Administered 2021-06-01: 25 ug via INTRAVENOUS
  Filled 2021-06-01: qty 2

## 2021-06-01 MED ORDER — PROPOFOL 10 MG/ML INTRAVENOUS EMULSION
INTRAVENOUS | Status: AC
Start: 2021-06-01 — End: 2021-06-01
  Filled 2021-06-01: qty 20

## 2021-06-01 MED ORDER — LIDOCAINE (PF) 100 MG/5 ML (2 %) INTRAVENOUS SYRINGE
INJECTION | Freq: Once | INTRAVENOUS | Status: DC | PRN
Start: 2021-06-01 — End: 2021-06-01
  Administered 2021-06-01: 75 mg via INTRAVENOUS

## 2021-06-01 MED ORDER — LACTATED RINGERS INTRAVENOUS SOLUTION
INTRAVENOUS | Status: DC | PRN
Start: 2021-06-01 — End: 2021-06-01

## 2021-06-01 MED ORDER — DEXTROSE 5% IN WATER (D5W) FLUSH BAG - 250 ML
INTRAVENOUS | Status: DC | PRN
Start: 2021-06-01 — End: 2021-06-08

## 2021-06-01 MED ORDER — CEFAZOLIN 1 GRAM/50 ML IN DEXTROSE (ISO-OSMOTIC) INTRAVENOUS PIGGYBACK
1.0000 g | INJECTION | Freq: Three times a day (TID) | INTRAVENOUS | Status: AC
Start: 2021-06-01 — End: 2021-06-02
  Administered 2021-06-01: 20:00:00 0 g via INTRAVENOUS
  Administered 2021-06-01: 20:00:00 1 g via INTRAVENOUS
  Administered 2021-06-02: 05:00:00 0 g via INTRAVENOUS
  Administered 2021-06-02: 05:00:00 1 g via INTRAVENOUS
  Filled 2021-06-01 (×2): qty 50

## 2021-06-01 MED ORDER — HYDROMORPHONE 1 MG/ML INJECTION WRAPPER
0.2000 mg | INJECTION | INTRAMUSCULAR | Status: DC | PRN
Start: 2021-06-01 — End: 2021-06-01

## 2021-06-01 MED ORDER — FENTANYL (PF) 50 MCG/ML INJECTION SOLUTION
12.5000 ug | INTRAMUSCULAR | Status: DC | PRN
Start: 2021-06-01 — End: 2021-06-01

## 2021-06-01 MED ORDER — SODIUM CHLORIDE 0.9 % (FLUSH) INJECTION SYRINGE
2.0000 mL | INJECTION | Freq: Three times a day (TID) | INTRAMUSCULAR | Status: DC
Start: 2021-06-01 — End: 2021-06-08
  Administered 2021-06-01 – 2021-06-05 (×13): 0 mL
  Administered 2021-06-05: 12:00:00 6 mL
  Administered 2021-06-06: 14:00:00 0 mL
  Administered 2021-06-06: 20:00:00 2 mL
  Administered 2021-06-06: 06:00:00 0 mL
  Administered 2021-06-07: 22:00:00 2 mL
  Administered 2021-06-07 (×2): 0 mL
  Administered 2021-06-08: 06:00:00 5 mL

## 2021-06-01 MED ORDER — ONDANSETRON HCL (PF) 4 MG/2 ML INJECTION SOLUTION
Freq: Once | INTRAMUSCULAR | Status: DC | PRN
Start: 2021-06-01 — End: 2021-06-01
  Administered 2021-06-01: 4 mg via INTRAVENOUS

## 2021-06-01 MED ORDER — PROPOFOL 10 MG/ML IV BOLUS
INJECTION | Freq: Once | INTRAVENOUS | Status: DC | PRN
Start: 2021-06-01 — End: 2021-06-01
  Administered 2021-06-01: 100 mg via INTRAVENOUS
  Administered 2021-06-01: 30 mg via INTRAVENOUS

## 2021-06-01 MED ORDER — LIDOCAINE (PF) 100 MG/5 ML (2 %) INTRAVENOUS SYRINGE
INJECTION | INTRAVENOUS | Status: AC
Start: 2021-06-01 — End: 2021-06-01
  Filled 2021-06-01: qty 5

## 2021-06-01 SURGICAL SUPPLY — 50 items
ADAPTER BRONCHSCP BLU 15MM PNEUPAC 2 AXIS SWVL FO O2 STRL LF  DISP (ENDOSCOPIC SUPPLIES) ×2 IMPLANT
ADAPTER BRONCHSCP BLU 15MM PNE_UPAC 2 AXIS SWVL FO O2 STRL LF (INSTRUMENTS ENDOMECHANICAL) ×1
CATH DRAIN HYDRAGLIDE 28FR 23IN STR TAPER 6 EYLT KINK RST THRMSNS RADOPQ THRX PVC HDRPH PED STRL LF (MED SURG SUPPLIES) ×2 IMPLANT
CATH DRAIN HYDRAGLIDE 28FR 23I_N STR TPR 6 EYLT KINK RST (MED SURG SUPPLIES) ×2
CONV USE ITEM 338661 - PACK SURG HVI THOR LAP VATS NONST DISP VIDEO AST THOR SRG LF (ROBOTIC SUPPLIES) ×2 IMPLANT
DDISCONTINUED USE 345070 - LOOP VESSEL MINI LF  YW RADOPQ SIL DVN DEV-O-LOOP STRL (PERFUSION/HEART SUPPLIES) ×2 IMPLANT
DISCONTINUED USE 329547 - SEAL ENDOS INSTR 5-8MM DAVINCI XI UNIV (ENDOSCOPIC SUPPLIES) ×6 IMPLANT
DISCONTINUED USE ITEM 329547 - SEAL CANN 12MM ENDOWRIST STPLR DAVINCI XI (ROBOTIC SUPPLIES) ×4 IMPLANT
DRAIN CHEST OASIS DRY SUCT_NEG 40CM CS/6 3600100 (MED SURG SUPPLIES) ×1
DRAIN INCS ATR OAS PLASTIC CHST THOR TUBE DRY SUCT COLLECT CHAMBER STRL LF  DISP ATS BAG (MED SURG SUPPLIES) ×2 IMPLANT
DRAPE ARM 21X19X10.5IN DAVINCI XI EQP 21LB (DRAPE/PACKS/SHEETS/OR TOWEL) ×8 IMPLANT
DRAPE ARM 21X19X10.5IN DAVINCI_XI EQP 21LB (DRAPE/PACKS/SHEETS/OR TOWEL) ×4
DRAPE CLMN DAVINCI XI EQP (DRAPE/PACKS/SHEETS/OR TOWEL) ×3 IMPLANT
ELECTRODE ESURG FLAT LHOOK 36CM CLEANCOAT PTFE COAT LAPSCP (SURGICAL CUTTING SUPPLIES) ×2 IMPLANT
ELECTRODE ESURG FLAT LHOOK 36C_M CLEANCOAT COAT LAPSCP (CUTTING ELEMENTS) ×1
ENDOCATCH 10MM SPEC POUCH_173050G 6EA/BX (INSTRUMENTS ENDOMECHANICAL) ×2
GAUZE ROLL KITTNER_15505/25 5RL/PK (MED SURG SUPPLIES) ×1
HVI THORACIC LAP VATS (ROBOTIC SUPPLIES) ×1
KIT ENDOS ENDOZIME BDSD SLR SPONGE ENZM DETERGENT 500ML (ENDOSCOPIC SUPPLIES) ×2 IMPLANT
KIT ENDOS ENZM BDSD SLR SPONGE_ENZM DTRG 500ML (INSTRUMENTS ENDOMECHANICAL) ×1
LOOP VESSEL MINI LF  YW RADOPQ SIL DVN DEV-O-LOOP STRL (PERFUSION/HEART SUPPLIES) ×2 IMPLANT
LOOP VESSEL MINI LF YW RADOPQ_SIL DVN DEV-O-LOOP STRL (PERFUSION/HEART SUPPLIES) ×1
NEEDLE INSFL 100MM 14GA STEP PNMPRTN TROCAR BLDLS RADIAL EXPD SLEEVE BLUNT STY VRSTP + SS (ENDOSCOPIC SUPPLIES) ×2 IMPLANT
NEEDLE INSFL 100MM 14GA STEP P_NMPRTN TROCAR BLDLS RADIAL (INSTRUMENTS ENDOMECHANICAL) ×1
PACK SURG HVI THOR LAP VATS NONST DISP VIDEO AST THOR SRG LF (ROBOTIC SUPPLIES) ×2
POUCH SPEC RETR 34.5CM 10MM E-CTCH GLD POLYUR ERG HNDL LONG CYL TUBE 29.5CM LAPSCP LF  DISP (ENDOSCOPIC SUPPLIES) ×4 IMPLANT
PRTC ALEXIS O FLXB RETRACT RING ATRAUMA TISS SM 18CM 2.5-6CM INCS (MED SURG SUPPLIES) ×2 IMPLANT
REDUCER LAPSCP 8-12MM DAVINCI XI ENDOWRIST CANN (ROBOTIC SUPPLIES) ×4 IMPLANT
REDUCER LAPSCP 8-12MM DVNC XI_EWRST CANN (ROBOTIC SUPPLIES) ×2
RETRACTOR WND ALEXIS 2.5-6CM_C8401 SMALL DISP 5/BX (MED SURG SUPPLIES) ×1
SEAL CANN 12MM ENDOWRIST STPLR DAVINCI XI (ROBOTIC SUPPLIES) ×4
SEAL CANN 12MM ENDOWRIST STPLR_DAVINCI XI (ROBOTIC SUPPLIES) ×2
SEAL ENDOS INSTR 5-8MM DAVINCI_XI UNIV (INSTRUMENTS ENDOMECHANICAL) ×3
SET TUBING PNEUMOCLEAR HIFLO SMOKE EVAC (MED SURG SUPPLIES) ×2 IMPLANT
SET TUBING PNEUMOCLEAR HIFLO S_MOKE EVAC DEMOTE (MED SURG SUPPLIES) ×1
SOL IRRG 0.9% NACL 1000ML PLASTIC PR BTL ISTNC N-PYRG STRL LF (MEDICATIONS/SOLUTIONS) ×2 IMPLANT
SOLUTION IRRG NS 2F7124 1000CC_12/CS (MEDICATIONS/SOLUTIONS) ×1
SPONGE SURG 4X4IN CIGARETTE STY RL GAUZE KTNR STRL LAPSCP (MED SURG SUPPLIES) ×2 IMPLANT
TROCAR 12MM W/EXPAND SLEEVE DI_VS101012P 3EA/BX (INSTRUMENTS ENDOMECHANICAL) ×1
TROCAR BLADELESS 12MM_6EA/BX DISP (INSTRUMENTS ENDOMECHANICAL) ×1
TROCAR LAPSCP 100MM 5MM KII FIOS Z THREAD SLEEVE 1ST ENTRY STRL LF  ACCESS SYS ABDOMINAL (ENDOSCOPIC SUPPLIES) ×2 IMPLANT
TROCAR LAPSCP STD 100MM 12MM VERSAONE FIX CANN BLDLS DLPHN NOSE TIP STRL LF  DISP (ENDOSCOPIC SUPPLIES) ×2 IMPLANT
TROCAR LAPSCP STD 12MM VRSTP + RADIAL EXPD SLEEVE CANN DIL STRL LF  DISP (ENDOSCOPIC SUPPLIES) ×2 IMPLANT
TROCAR Z-THREAD KII 5X100MM_CTF03 (INSTRUMENTS ENDOMECHANICAL) ×1
VALVE BIOPSY US BRONCHSCP STRL LF  DISP (ENDOSCOPIC SUPPLIES) ×2 IMPLANT
VALVE BRONCHOSCOPE SUCT MAJ209_20 EA/BX (INSTRUMENTS ENDOMECHANICAL) ×1
VALVE BRONCHOSCOPE SUCT MAJ210_STRL DISP 20EA/BX (INSTRUMENTS ENDOMECHANICAL) ×1
VALVE SUCT FLXB ATTACH BRONCHSCP STRL LF  DISP (ENDOSCOPIC SUPPLIES) ×2 IMPLANT
WATER STRL 1000ML PLASTIC PR BTL LF (MED SURG SUPPLIES) ×4 IMPLANT
WATER STRL 1000ML PLASTIC PR B_TL LF (MED SURG SUPPLIES) ×2

## 2021-06-01 NOTE — Anesthesia Transfer of Care (Signed)
ANESTHESIA TRANSFER OF CARE   Misty Johnson is a 70 y.o. ,female, Weight: 76.2 kg (167 lb 15.9 oz)   had Procedure(s):  ROBOTIC EXCISION MASS MEDIASTINAL  performed  06/01/21   Primary Service: Adline Mango, MD    Past Medical History:   Diagnosis Date   . CAD (coronary artery disease)    . Chronic hypoxemic respiratory failure (CMS HCC)     3L NC at home   . Compression fracture of T8 vertebra (CMS HCC)    . Congestive heart failure (CMS HCC)    . COPD (chronic obstructive pulmonary disease) (CMS HCC)    . COPD (chronic obstructive pulmonary disease) (CMS HCC)    . 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 Enlarged heart    . LVH (left ventricular hypertrophy)    . Major depression       Allergy History as of 06/01/21      No Known Allergies              I completed my transfer of care / handoff to the receiving personnel during which we discussed:                                                Additional Info:Pt to pacu on O2 via fm. Vss. Report given to rn.                         Last OR Temp: Temperature: 36 C (96.8 F)  ABG:  POTASSIUM   Date Value Ref Range Status   05/31/2021 3.3 (L) 3.5 - 5.1 mmol/L Final     KETONES   Date Value Ref Range Status   05/29/2021 Negative Negative mg/dL Final     CALCIUM   Date Value Ref Range Status   05/31/2021 8.6 (L) 8.8 - 10.2 mg/dL Final     Airway:* No LDAs found *  Blood pressure (!) 111/41, pulse 63, temperature 36 C (96.8 F), resp. rate 18, height 1.626 m (5\' 4" ), weight 76.2 kg (167 lb 15.9 oz), SpO2 98 %.

## 2021-06-01 NOTE — Progress Notes (Signed)
[facility]  Thoracic Surgery   Progress Note      Misty Johnson, Misty Johnson  Date of Admission:  05/28/2021  Date of Birth:  11/18/1950    Hospital Day:  LOS: 4 days   Date of Service:  06/01/2021    Subjective   No acute events overnight. Breathing stable. Resting comfortably in bed in no acute distress.     Objective      Vital Signs:  Temp (24hrs) Max:36.9 C (98.4 F)      Temperature: 36.9 C (98.4 F)  BP (Non-Invasive): (!) 141/48 (rn notified)  MAP (Non-Invasive): 75 mmHG  Heart Rate: 53  Respiratory Rate: 16  SpO2: 95 %    Base (Admission) Weight:  Weight: 76.2 kg (167 lb 15.9 oz)  Weight:  Weight: 76.2 kg (167 lb 15.9 oz)      Current Medications:  acetaminophen (TYLENOL) tablet, 650 mg, Oral, Q4H PRN  albuterol (PROVENTIL) 2.5 mg / 3 mL (0.083%) neb solution, 2.5 mg, Nebulization, Q4H PRN  [Held by provider] aspirin chewable tablet 81 mg, 81 mg, Oral, Daily  atorvastatin (LIPITOR) tablet, 40 mg, Oral, QPM  budesonide-formoterol (SYMBICORT) 160 mcg-4.5 mcg per inhalation oral inhaler - "Nursing to administer", 2 Puff, Inhalation, 2x/day  calcium carbonate (TUMS) 500mg  (200mg  elemental calcium) chewable tablet, 500 mg, Oral, 3x/day PRN  D5W 250 mL flush bag, , Intravenous, Q15 Min PRN  doxycycline tablet, 100 mg, Oral, 2x/day  [Held by provider] enoxaparin PF (LOVENOX) 40 mg/0.4 mL SubQ injection, 40 mg, Subcutaneous, Daily  escitalopram (LEXAPRO) tablet, 20 mg, Oral, Daily  guaiFENesin 100mg  per 84mL oral liquid - for cough (expectorant), 200 mg, Oral, Q4H PRN  loratadine (CLARITIN) tablet, 10 mg, Oral, Daily  NS 10 mL with bupivacaine liposome (PF) (EXPAREL) 20 mL, dexamethasone (DECADRON) 10 mg injection, , , One-Step Med: Once PRN  NS 250 mL flush bag, , Intravenous, Q15 Min PRN  NS flush syringe, 2-6 mL, Intracatheter, Q8HRS  NS flush syringe, 2-6 mL, Intracatheter, Q1 MIN PRN  nystatin (MYCOSTATIN) 100,000 units per mL oral liquid, 5 mL, Swish & Swallow, 4x/day  ondansetron (ZOFRAN) 2 mg/mL  injection, 4 mg, Intravenous, Q6H PRN  pantoprazole (PROTONIX) delayed release tablet, 20 mg, Oral, Daily before Breakfast      Appropriate Home Meds restarted:   Per primary team    I/O:  I/O last 24 hours to current time:      Intake/Output Summary (Last 24 hours) at 06/01/2021 1148  Last data filed at 06/01/2021 0813  Gross per 24 hour   Intake 744 ml   Output 555 ml   Net 189 ml     I/O last 3 completed shifts:  11/29 0700 - 11/29 1859  In: -   Out: 100 [Urine:100]      In: 744 [P.O.:744]  Out: 505 [Urine:505]    Nutrition/Diet:  MNT PROTOCOL FOR DIETITIAN  DIETARY ORAL SUPPLEMENTS Oral Supplements with tray: Magic Cup-Orange; LUNCH/DINNER; 1 Each  ROOM SERVICE:  NEEDS VISIT FOR MENU CHOICES  DIET NPO - SPECIFIC DATE & TIME EXCEPT ALL MEDS WITH SIPS OF WATER    Labs:  Reviewed:  I have reviewed all lab results.  Lab Results Today:    CBC with Diff (Last 24 Hours):    No results for input(s): WBC, HGB, HCT, MCV, PLTCNT, BANDS, PMNS, LYMPHO, LYMPHOCYTES, MONOCYTES, EOSINO, EOSINOPHIL, BASOPHILS in the last 24 hours.      Radiology:    Reviewed:   No new imaging  Physical Exam:  Constitutional:  no distress and vital signs reviewed  Eyes:  Conjunctiva clear.  ENT:  Mouth mucous membranes moist.   Neck:  supple, symmetrical, trachea midline  Respiratory:  No respiratory distress. On nasal cannula  Cardiovascular:  regular rate and rhythm  Musculoskeletal:  Head atraumatic and normocephalic  Integumentary:  Skin warm and dry  Neurologic:  Grossly normal          ASSESSMENT:  Mediastinal mass  COPD on nasal cannula at baseline    Problem List:  Active Hospital Problems    Diagnosis    Primary Problem: Mediastinal mass       PT/OT:  Per primary team    Disposition Planning:  Per primary team      PLAN:  -MG labs pending  -Tentative plan for OR Tuesday, 11/29, for mediastinal mass resection   -Please make patient NPO at 0015 on 11/29   -Please hold any morning DVT prophylaxis on 11/29   -Please hold aspirin if okay  with primary team  -Rest of care per primary team  -Thank you for this consult. We will continue to follow.    I independently of the faculty provider spent a total of (10) minutes in direct/indirect care of this patient including initial evaluation, review of laboratory, radiology, diagnostic studies, review of medical record, order entry and coordination of care.    Misty Lucks, PA-C  06/01/2021, 11:48  I personally saw and evaluated the patient. See Misty Johnson note for additional details.   Misty Diver, MD

## 2021-06-01 NOTE — Brief Op Note (Signed)
[facility]                                                     BRIEF OPERATIVE NOTE    Patient Name: Misty Johnson, Misty Johnson Number: [id number]  Date of Service: 06/01/2021   Date of Birth: 08-Nov-1950    All elements must be documented.    Pre-Operative Diagnosis:   Anterior mediastinal mass  Post-Operative Diagnosis: Cystic anterior mediastinal mass  Procedure(s)/Description:    Right VATS robotic assisted anterior mediastinal mass resection  Intercostal nerve blocks  Findings/Complexity (inherent to the procedure performed): Cystic anterior mediastinal mass     Attending Surgeon: Dr. Doree Barthel  Assistant(s): Dr. Doug Sou, Dr. Manson Passey    Anesthesia Type: General  Estimated Blood Loss:  Minimal  Blood Given: None  Fluids Given: Per anesthesia records  Complications (not routinely expected or not inherent to difficulty/nature of procedure):None  Characteristic Event (routinely expected or inherent to the difficulty/nature of the procedure): None  Did the use of current and/or prior Anticoagulants impact the outcome of the case? N\A  Wound Class: Clean Wound: Uninfected operative wounds in which no inflammation occurred    Tubes: Chest Tube  Drains: None  Specimens/ Cultures: Cystic anterior mediastinal mass  Implants: None           Disposition: PACU - hemodynamically stable.  Condition: stable    Delene Ruffini, MD

## 2021-06-01 NOTE — Care Plan (Signed)
[place]  Occupational Therapy Initial Evaluation    Patient Name: Misty Johnson  Date of Birth: 05/30/1951  Height: Height: 162.6 cm ('5\' 4"' )  Weight: Weight: 76.2 kg (167 lb 15.9 oz)  Room/Bed: HVI ORPROC/POOL  Payor: MEDICARE / Plan: MEDICARE PART A AND B / Product Type: Medicare /     Assessment:   OT eval completed at b/s on 9W.  Pt presents with decreased balance, endurance and mild strength deficit which limits ease and I with adls and adl related mobility.  Pt to have a procedure today and would likely benefit from reassessment post procedure prior to making formal d/c recs.      Discharge Needs:   Equipment Recommendation: none anticipated      Discharge Disposition: to be determined (home with 24/7 using her FWW vs short stay rehab)    [place]   Based on current diagnosis, functional performance prior to admission, and current functional performance, this patient requires continued OT services in to be determined (home with 24/7 using her FWW vs short stay rehab)  in order to achieve significant functional improvements.    Plan:   Current Intervention: ADL retraining, balance training, bed mobility training, endurance training, transfer training    To provide Occupational therapy services 1x/day, minimum of 2x/week, until discharge.       The risks/benefits of therapy have been discussed with the patient/caregiver and he/she is in agreement with the established plan of care.       Subjective & Objective        06/01/21 0919   Therapist Pager   OT Assigned/ Pager # Misty Johnson [phone]   Rehab Session   Document Type evaluation   Total OT Minutes: 19   Patient Effort good   Symptoms Noted During/After Treatment fatigue;shortness of breath   General Information   Pertinent History of Current Functional Problem Misty Johnson is a 70 y.o., White female with medical history of CAD, HFrEF, COPD and chronic hypoxic respiratory failure on 3L NC at  home who presented to [facility] due to having SOB and hoarseness for the last few days. She endorse having increased wheezing, sputum production and cough. She also has hoarseness that compromised her ability to vocalize normally. At the outside hospital, she was found to have COPD exacerbation and started on treatment for it. They performed a CT scan of chest which showed a mediastinal mass. This prompted patient to be transferred to [facility] for further evaluation.     Patient endorses having nausea, sob, sputum production, wheezing and hoarseness. Denies having chest pain, vomiting, abdominal pain, diarrhea or dysuria.   Medical Lines PIV Line;Telemetry;Foley Catheter   Respiratory Status nasal cannula  (3L)   Existing Precautions/Restrictions fall precautions;full code;oxygen therapy device and L/min   Pre Treatment Status   Pre Treatment Patient Status Patient supine in bed;Call light within reach;Telephone within reach;Sitter select activated;Nurse approved session   Support Present Pre Treatment  None   Communication Pre Treatment  Nurse   Mutuality/Individual Preferences   Individualized Care Needs OOB with FWW and assist + O2, encourage participation in adl routine with assist prn   Patient-Specific Goals (Include Timeframe) procedure today   Plan of Care Reviewed With patient   Living Environment   Lives With child(ren), adult  (son)   Living Arrangements house   Living Environment Comment 70f + basement, 3  STE in rear with rail, 6-7 steps front entrance with rail  Functional Level Prior   Prior Functional Level Comment Pt reports she was I with transfers and household ambulation with no AD (but has a cane and FWW if needed), uses 3L O2 at home, able to complete all self care.  Son is at home- pt reports he is disabled but did not specify the nature of his disability.  Pt enjoys watching TV.   Self-Care   Equipment Currently Used at Home yes   Equipment Currently Used at Home oxygen    Vital Signs   O2 Delivery Pre Treatment supplemental O2   O2 Delivery Post Treatment supplemental O2   Coping/Psychosocial   Observed Emotional State calm;cooperative   Verbalized Emotional State acceptance   Family/Support System   Involvement in Care not present at bedside   Coping/Psychosocial Response Interventions   Plan Of Care Reviewed With patient   Trust Relationship/Rapport care explained;choices provided;reassurance provided;thoughts/feelings acknowledged;questions encouraged;questions answered   Cognitive Assessment/Interventions   Behavior/Mood Observations alert;cooperative   Orientation Status oriented x 4   Attention WNL/WFL   Follows Commands WNL   RUE Assessment   RUE Assessment WFL- Within Functional Limits   LUE Assessment   LUE Assessment WFL- Within Functional Limits   RLE Assessment   RLE Assessment X-Exceptions   RLE ROM wfls   RLE Strength generalized weakness   LLE Assessment   LLE Assessment X-Exceptions   LLE ROM wfls   LLE Strength generalized weakness   Bed Mobility Assessment/Treatment   Bed Mobility, Assistive Device Head of Bed Elevated   Supine-Sit Independence supervision required   Sit to Supine, Independence supervision required   Transfer Assessment/Treatment   Sit-Stand Independence contact guard assist;minimum assist (75% patient effort);verbal cues required   Stand-Sit Independence contact guard assist;verbal cues required   Sit-Stand-Sit, Assist Device handheld assist   Bed-Chair Independence contact guard assist;verbal cues required   Chair-Bed Independence minimum assist (75% patient effort);verbal cues required   Bed-Chair-Bed Assist Device handheld assist   Transfer Comment minA required to transfer to standing from armless chair at sink   Upper Body Dressing Assessment/Training   Position  sitting   Independence Level  contact guard assist;minimum assist (75% patient effort)   Comment gown as robe   Toileting Assessment/Training   Comment foley   Grooming  Assessment/Training   Position sitting   Independence Level supervision required   Comment denture care, wash hands and face *required chair due to fatigue in standing   Balance Skill Training   Sitting Balance: Static fair + balance   Sitting, Dynamic (Balance) fair balance   Sit-to-Stand Balance fair - balance   Standing Balance: Static fair - balance   Standing Balance: Dynamic poor + balance   Therapeutic Exercise/Activity   Comment ambulates in room approx 15' and 4' with minA hand held- anticipate performance would be improved with FWW   Post Treatment Status   Post Treatment Patient Status Patient supine in bed;Call light within reach;Telephone within reach;Sitter select activated   Support Present Post Treatment  None   Care Plan Goals   OT Rehab Goals Occupational Therapy Goal;Occupational Therapy Goal 2;LB Dressing Goal;Toileting Goal;Transfer Training Goal 2;Grooming Goal   Occupational Therapy Goals   OT Goal, Date Established 06/01/21   OT Goal, Time to Achieve by discharge   OT Goal, Activity Type Perform all bed mobility I.   Occupational Therapy Goal 2   OT Goal, Date Established 06/01/21   OT Goal, Time to Achieve by discharge   OT Goal, Activity Type Improve  functional seated and standing balance to Fair+ during adls and adl related mobility.   Grooming Goal   Grooming Goal, Date Established 06/01/21   Grooming Goal, Time to Achieve by discharge   Grooming Goal, Activity Type all grooming tasks   Grooming Goal, Independence  independent   LB Dressing Goal   LB Dressing Goal, Date Established 06/01/21   LB Dressing Goal, Time to Achieve by discharge   LB Dressing Goal, Activity Type all lower body dressing tasks   LB Dressing Goal, Independence Level independent   Toileting Goal   Toileting Goal, Date Established 06/01/21   Toileting Goal, Time to Achieve by discharge   Toileting Goal, Activity Type all toileting tasks   Toileting Goal, Independence Level independent   Transfer Training Goal 2    Transfer Training Goal, Date Established 06/01/21   Transfer Training Goal, Time to Achieve by discharge   Transfer Training Goal, Activity Type all transfers   Transfer Training Goal, Independence Level modified independence   Planned Therapy Interventions, OT Eval   Planned Therapy Interventions ADL retraining;balance training;bed mobility training;endurance training;transfer training   Clinical Impression   Functional Level at Time of Session OT eval completed at b/s on 9W.  Pt presents with decreased balance, endurance and mild strength deficit which limits ease and I with adls and adl related mobility.  Pt to have a procedure today and would likely benefit from reassessment post procedure prior to making formal d/c recs.   Criteria for Skilled Therapeutic Interventions Met (OT) yes   Rehab Potential good   Therapy Frequency 1x/day;minimum of 2x/week   Predicted Duration of Therapy until discharge   Anticipated Equipment Needs at Discharge none anticipated   Anticipated Discharge Disposition to be determined  (home with 24/7 using her FWW vs short stay rehab)   Highest level of Mobility score   Exercise/Activity Level Performed 6- Walked 10 steps or more   Evaluation Complexity Justification   Occupational Profile Review Expanded review   Performance Deficits 3-5 deficits   Clinical Decision Making Moderate analytic complexity   Evaluation Complexity Moderate       Therapist:   Forbes Cellar, OT   Pager #: [phone]

## 2021-06-01 NOTE — Nurses Notes (Signed)
Patient returned to HVI pre-post from procedure. Denies any concerns. Patient placed on monitor. Patient educated on s/s of bleeding and bedrest precautions, verbalized understanding. Will continue to monitor.

## 2021-06-01 NOTE — Anesthesia Preprocedure Evaluation (Addendum)
ANESTHESIA PRE-OP EVALUATION  Planned Procedure: ROBOTIC EXCISION MASS MEDIASTINAL (Right: Chest)  BRONCHOSCOPY FLEXIBLE ADULT  Review of Systems     anesthesia history negative     patient summary reviewed  nursing notes reviewed        Pulmonary   COPD, moderate, asthma, shortness of breath, home oxygen, current smoker and Denies smoking in last 24  hours,   Cardiovascular    CAD, CHF and ECG reviewed ,No peripheral edema,  Exercise Tolerance: <4 METS        GI/Hepatic/Renal   negative GI/hepatic/renal ROS,         Endo/Other   neg endo/other ROS,       Neuro/Psych/MS    depression     Cancer                      Physical Assessment      Airway       Mallampati: III    TM distance: <3 FB    Neck ROM: full  Mouth Opening: poor.  No Facial hair  No Beard  No endotracheal tube present  No Tracheostomy present    Dental           (+) edentulous           Pulmonary      (+) decreased breath sounds present, wheezes present and rales present        Cardiovascular    Rhythm: regular  Rate: Normal  (-) no friction rub, carotid bruit is not present, no peripheral edema and no murmur     Other findings            Plan  ASA 3     Planned anesthesia type: general     general anesthesia with endotracheal tube intubation and single lung ventilation    plan to administer opioids postoperatively    SLEEP APNEA  Patient is at risk of obstructive sleep apnea and Education provided regarding risk of obstructive sleep apnea    Additional Plans: Arterial line    PONV/POV Plan:  I plan to administer pharmcologic prophalaxis antiemetics  Intravenous and Rapid sequence induction     Anesthesia issues/risks discussed are: Art Line Placement, Post-op Agitation/Tantrum, Blood Loss, Nerve Injuries, Eye /Visual Loss, PONV, Post-op Intubation/Ventilation, Post-op Cognitive Dysfunction, Post-op Pain Management, Dental Injuries, Cardiac Events/MI, Stroke, Intraoperative Awareness/ Recall, Difficult Airway and Sore Throat.  Anesthetic plan and  risks discussed with patient  Signed consent obtained        Use of blood products discussed with patient who consented to blood products.     Patient's NPO status is appropriate for Anesthesia.           Plan discussed with fellow and attending.    (20F w PMH COPD on home O2 3L NC, CAD, and mediastinal mass here for mediastinal mass resection.     Plan:   Multimodal analgesia   GETA   Single lung ventilation   2 PIV  Arterial line  Type and screen, crossmatch 2 units.)

## 2021-06-01 NOTE — Care Plan (Signed)
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 D/C recommendation depends on her progress after today's procedure. She has good potential to regain her baseline level of being a household ambulator, but she may need to use a FWW for safety, initially. PT can work with her to improve her functional mobility and independence, and to continue to update d/c rec's.    Treatment Goals:        -The patient will transfer independently by D/C to home.    -The patient will ambulate independently >100 feet with FWW by D/C to home.    -The patient will ambulate up and down >3 steps with supervision by D/C to home    -The patient will have all 4 extremity  strength of at least 4+/5 at time of d/c.        Discharge Needs:      Equipment Recommendation: none anticipated   The patient presents with mobility limitations due to impaired balance, impaired strength and impaired functional activity tolerance that significantly impair/prevent patient's ability to participate in mobility-related activities of daily living (MRADLs) including  ambulation and transfers in order to safely complete, toileting, bathing, laundering/household tasks, safely entering/exiting the home, in reasonable time. This functional mobility deficit can be sufficiently resolved with the use of a FWW,  in order to decrease the risk of falls, morbidity, and mortality in performance of these MRADLs.  Patient is able to safely use this assistive  device.      Discharge Disposition: home with 24/7 assistance (vs IPR if her progress is slow post procedure.)    JUSTIFICATION OF DISCHARGE RECOMMENDATION   Based on current diagnosis, functional performance prior to admission, and current functional performance, this patient requires continued PT services in home with 24/7 assistance (vs IPR if her progress is slow post procedure.) in order to achieve significant functional improvements in these deficit areas: aerobic capacity/endurance, gait, locomotion, and balance, muscle performance.  The above recommendation is based upon the current examination and evaluation performed on this date. As subsequent sessions are completed, recommendations will be updated accordingly.        Plan:   Current Intervention:    To provide physical therapy services 1x/day, minimum of 2x/week  for duration of until discharge.    The risks/benefits of therapy have been discussed with the patient/caregiver and he/she is in agreement with the established plan of care.       Subjective & Objective        Past Medical History:   Diagnosis Date   . CAD (coronary artery disease)    . Chronic hypoxemic respiratory failure (CMS HCC)     3L NC at home   . Compression fracture of T8 vertebra (CMS HCC)    . Congestive heart failure (CMS HCC)    . COPD (chronic obstructive pulmonary disease) (CMS HCC)    . COPD (chronic obstructive pulmonary disease) (CMS HCC)    .  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 Enlarged heart    . LVH (left ventricular hypertrophy)    . Major depression            Past Surgical History:   Procedure Laterality Date   . NO PAST SURGERIES            reports that she has quit smoking. Her smoking use included cigarettes. She has never used smokeless tobacco. She reports that she does not currently use alcohol. She reports that she does not currently use drugs.    Social History     Tobacco Use   Smoking Status Former   . Types: Cigarettes   Smokeless Tobacco Never               06/01/21 0920   Therapist Pager   PT Assigned/ Pager # [phone]   Rehab Session   Document Type evaluation   Total PT Minutes: 19   Patient Effort good   Symptoms Noted During/After Treatment shortness of breath;fatigue   General Information   Patient Profile Reviewed yes   Patient/Family/Caregiver Comments/Observations pt stated that she doesn't use any AD in her home. Concerned about today's procedure. "I want to go home."   Pertinent History of Current Functional Problem 70 yo F admitted with COPD exacerbation and recently discovered mediastinal mass. She is scheduled for a procdure to resect the mass, today. PMH includes CAD, COPD with home O2 3 L/min, CHF.   Medical Lines PIV Line;Telemetry   Respiratory Status nasal cannula  (3 L/min)   Existing Precautions/Restrictions fall precautions;full code   General Observations of Patient seen in 904 in am, with OT for clustered care. Pt agreeable to PT.   Mutuality/Individual Preferences   Anxieties, Fears or Concerns worried about surgery. Feels cold.   Individualized Care Needs OOB with hand hold assist of 1   Plan of Care Reviewed With patient   Living Environment   Lives With child(ren), adult  (son. Her daughter assists with laundry)   Living Arrangements house   Living Environment Comment +STE, uses one level, doesn't go into the basement   Functional Level Prior   Ambulation 0 - independent   Transferring 0 - independent   Prior Functional Level Comment the pt has a cane and a FWW, she stated that she doesn't use them.   Pre Treatment Status   Pre Treatment Patient Status Patient supine in bed;Call light within reach;Telephone within reach   Support Present Pre Treatment  None   Communication Pre Treatment  Nurse   Cognitive Assessment/Interventions   Behavior/Mood Observations alert;cooperative;anxious   Orientation Status oriented x 4   Attention WNL/WFL   Follows Commands WNL   Pain Assessment   Pre/Posttreatment Pain Comment the pt did not rate  on pain scale   RUE Assessment   RUE Assessment WFL- Within Functional Limits   LUE Assessment   LUE Assessment WFL- Within Functional Limits   RLE Assessment   RLE ROM WFL actively   RLE Strength generalized weakness   LLE Assessment   LLE ROM WFL actively   LLE Strength generalized weakness   Bed Mobility Assessment/Treatment   Bed Mobility, Assistive Device Head of Bed Elevated;bed rails   Supine-Sit Independence stand-by assistance;modified independence   Sit to Supine, Independence stand-by assistance;modified independence   Transfer Assessment/Treatment   Sit-Stand Independence contact guard assist;modified independence   Stand-Sit Independence contact guard assist;modified independence   Sit-Stand-Sit, Assist Device handheld assist  Transfer Safety Issues weight-shifting ability decreased   Transfer Impairments balance impaired;endurance;postural control impaired;strength decreased   Gait Assessment/Treatment   Total Distance Ambulated 20   Independence  minimum assist (75% patient effort)   Assistive Device  hand-held assistance   Distance in Feet 10 ft x 2   Gait Speed slow   Deviations  step length decreased;cadence decreased;weight-shifting ability decreased   Safety Issues  balance decreased during turns;step length decreased;weight-shifting ability decreased   Impairments  balance impaired;postural control impaired;strength decreased;endurance   Comment unsteady while ambulating   Balance Skill Training   Comment HHA in standing   Sitting Balance: Static fair + balance   Sitting, Dynamic (Balance) fair balance   Sit-to-Stand Balance fair - balance   Standing Balance: Static fair - balance   Standing Balance: Dynamic poor + balance   Systems Impairment Contributing to Balance Disturbance musculoskeletal   Post Treatment Status   Post Treatment Patient Status Patient supine in bed;Call light within reach;Telephone within reach;Sitter select activated   Support Present Post Treatment  None   Plan of Care  Review   Plan Of Care Reviewed With patient   Basic Mobility Am-PAC/6Clicks Score (APPROVED PT Staff, [facility] PT/OT, [facility] ONLY, [facility], [facility], and [facility])   Turning in bed without bedrails 4   Lying on back to sitting on edge of flat bed 4   Moving to and from a bed to a chair 3   Standing up from chair 3   Walk in room 3   Climbing 3-5 steps with railing 2   6 Clicks Raw Score total 19   Standardized (t-scale) score 42.48   CMS 0-100% Score 36.99   CMS Modifier CJ   Patient Mobility Goal (JHHLM) 6- Walk 10 steps or more 2X/day   Exercise/Activity Level Performed 6- Walked 10 steps or more   Physical Therapy Clinical Impression   Assessment 70 yo F with impaired functional balance, endurance, ambulation and safety, who will need to work with PT during this admission. D/C recommendation depends on her progress after today's procedure. She has good potential to regain her baseline level of being a household ambulator, but she may need to use a FWW for safety, initially. PT can work with her to improve her functional mobility and independence, and to continue to update d/c rec's.   Criteria for Skilled Therapeutic yes   Pathology/Pathophysiology Noted musculoskeletal   Impairments Found (describe specific impairments) aerobic capacity/endurance;gait, locomotion, and balance;muscle performance   Functional Limitations in Following  self-care;home management;community/leisure   Rehab Potential good   Therapy Frequency 1x/day;minimum of 2x/week   Predicted Duration of Therapy Intervention (days/wks) until discharge   Anticipated Equipment Needs at Discharge (PT) none anticipated   Anticipated Discharge Disposition home with 24/7 assistance  (vs IPR if her progress is slow post procedure.)   Evaluation Complexity Justification   Patient History: Co-morbidity/factors that impact Plan of Care 3 or more that impact Plan of Care;COPD: decreased respiratory capacity impacting function;Age;One or more other medical co-morbidity    Examination Components 4 or more Exam elements addressed;Range of motion;Strength;Balance;Bed mobility;Transfers;Ambulation   Presentation Evolving: Symptoms, complaints, characteristics of condition changing &/or cognitive deficits present   Clinical Decision Making Low complexity   Evaluation Complexity Low complexity         Planned Therapy Interventions:   -Balance training    -Gait training   -Bed mobility training    -Strengthening   -ROM(range of motion)   -Transfer training   -Patient/family  education   -Home exercise program   -Stair training         Therapist:   Roney Mans, PT  Pager [phone]

## 2021-06-01 NOTE — OR Surgeon (Signed)
Patient Name: Eupha, Lobb Number: [id number]  Date of Service: 06/01/2021   Date of Birth: 10/31/1950    Pre-Operative Diagnosis:   Anterior mediastinal mass  Post-Operative Diagnosis: Cystic anterior mediastinal mass  Procedure(s)/Description:    Right Robotic assisted anterior mediastinal Cyst resection  Intercostal nerve blocks      Attending Surgeon: Dr. Doree Barthel  Assistant(s): Dr. Doug Sou, Dr. Manson Passey    Anesthesia Type: General  Estimated Blood Loss:  Minimal  Blood Given: None  Fluids Given: Per anesthesia records  Indications:70 y.o. White female who is admitted with shortness of breath and hoarseness of voice. Patient originally presented to [facility] where a CT chest was performed that showed a mediastinal mass. Patient reports shortness of breath and hoarseness along with increased wheezing, sputum production, and cough.  PMH includes CAD, HFrEF, COPD on 3L NC at home.  I discussed risks and benefits of surgery with the patient and the family.  They agreed to proceed while her stay in the hospital.  I also communicated with the internal medicine doctors and they also approved her surgery.      Details:  Right Robotic assisted anterior mediastinal Cyst resection:  The patient was intubated with double-lumen tube a time-out was given she was confirmed to be correct patient procedure and side of surgery were confirmed to be correct.  The table was positioned and the patient with a shoulder roll under her right shoulder and arm placed tacked to the chest.  She was prepped and draped.  An incision was made with the video-assisted thoracoscopy camera with CO2 insufflation, 2 other ports were opened with visual control of the video-assisted thoracoscopy camera and robot was docked.  There was a huge cystic mass located in the right side of the mediastinum compressing the heart.  With careful dissection and exploration without an assistant port, the mass was mobilized from the thymic  tissue and resected completely.  And after completion of the resection was ruptured and it was placed in 10 Endo bag.  Specimen was removed the robot was undocked a Blake drain was placed Exparel was performed.  The patient was extubated on table.      Tubes: Chest Tube x 1  Drains: None  Specimens/ Cultures: Cystic anterior mediastinal mass  Implants: None           Disposition: PACU - hemodynamically stable.  Condition: stable    Bing Matter, MD

## 2021-06-01 NOTE — Nurses Notes (Signed)
Report called to 9W RN, Lauren, prior to transfer. Patient returned to pre-procedure baseline assessment and vital signs. Patient transferred back to floor via transport.

## 2021-06-01 NOTE — Anesthesia Procedure Notes (Signed)
Arterial Line Procedure    Pt location: In OR  Consent:     Risks discussed:  Bleeding, pain and infection  Universal protocol:     Procedure explained and questions answered to patient or proxy's satisfaction: yes      Patient identity confirmed:  Verbally with patient, arm band and hospital-assigned identification number  Pre-procedure details:       Skin Prep used: Chlorhexidine gluconate and Isopropyl alcohol  Anesthesia (see MAR for exact dosages):     Anesthesia method:  none    A 20 G Catheter type: Arrow 2 in in length,  Placed on the left  radial artery  using anatomical landmarks, palpation, guidewire and Seldinger With  number of attempts:2.Secured with: transparent dressing   MEDICATIONS:     Post-procedure details:  Complications:line placement aborted  Performed By:  Performing provider: El Lauris Chroman, [place], DO Authorizing provider: Modesto Charon, MD

## 2021-06-01 NOTE — Nurses Notes (Signed)
Medications administered per order. Assessment made, patient alert and oriented x4. No complaints of pain or N/V reported at this time. Plan of care discussed with patient and patient expressed understanding. Patient left with call bell and personal items within reach and bed in low position. Will continue to monitor.

## 2021-06-01 NOTE — Progress Notes (Signed)
[facility]  Medicine Progress Note    Misty Johnson  Date of service: 06/01/2021  Date of Admission:  05/28/2021    Hospital Day:  LOS: 4 days       Chief Complaint: F/U SOB, Hoarsness     Subjective:   No acute events overnight. Patient seen after her VATS. She is having some pain after her procedure. Discussed with patient about PCA. Daughter updated at bedside.     Vital Signs:  Temp  Avg: 36.7 C (98 F)  Min: 36 C (96.8 F)  Max: 37.1 C (98.8 F)    Pulse  Avg: 53.8  Min: 48  Max: 63 BP  Min: 106/52  Max: 141/48   Resp  Avg: 16.9  Min: 13  Max: 23 SpO2  Avg: 94.3 %  Min: 90 %  Max: 99 %          Input/Output    Intake/Output Summary (Last 24 hours) at 06/01/2021 1736  Last data filed at 06/01/2021 1603  Gross per 24 hour   Intake 1500 ml   Output 755 ml   Net 745 ml    I/O last shift:  11/29 0700 - 11/29 1859  In: 1300 [I.V.:1300]  Out: 525 [Urine:525]   acetaminophen (TYLENOL) tablet, 650 mg, Oral, Q4H PRN  albuterol (PROVENTIL) 2.5 mg / 3 mL (0.083%) neb solution, 2.5 mg, Nebulization, Q4H PRN  [Held by provider] aspirin chewable tablet 81 mg, 81 mg, Oral, Daily  atorvastatin (LIPITOR) tablet, 40 mg, Oral, QPM  budesonide-formoterol (SYMBICORT) 160 mcg-4.5 mcg per inhalation oral inhaler - "Nursing to administer", 2 Puff, Inhalation, 2x/day  calcium carbonate (TUMS) 500mg  (200mg  elemental calcium) chewable tablet, 500 mg, Oral, 3x/day PRN  ceFAZolin (ANCEF) 1 g in iso-osmotic 50 mL premix IVPB, 1 g, Intravenous, Q8H  D5W 250 mL flush bag, , Intravenous, Q15 Min PRN  D5W 250 mL flush bag, , Intravenous, Q15 Min PRN  doxycycline tablet, 100 mg, Oral, 2x/day  [Held by provider] enoxaparin PF (LOVENOX) 40 mg/0.4 mL SubQ injection, 40 mg, Subcutaneous, Daily  escitalopram (LEXAPRO) tablet, 20 mg, Oral, Daily  guaiFENesin 100mg  per 14mL oral liquid - for cough (expectorant), 200 mg, Oral, Q4H PRN  loratadine (CLARITIN) tablet, 10 mg, Oral, Daily  morphine 1 mg/mL in NS PCA, , Intravenous,  Continuous  NS 250 mL flush bag, , Intravenous, Q15 Min PRN  NS 250 mL flush bag, , Intravenous, Q15 Min PRN  NS flush syringe, 2-6 mL, Intracatheter, Q8HRS  NS flush syringe, 2-6 mL, Intracatheter, Q1 MIN PRN  NS flush syringe, 2-6 mL, Intracatheter, Q8HRS  NS flush syringe, 2-6 mL, Intracatheter, Q1 MIN PRN  nystatin (MYCOSTATIN) 100,000 units per mL oral liquid, 5 mL, Swish & Swallow, 4x/day  ondansetron (ZOFRAN) 2 mg/mL injection, 4 mg, Intravenous, Q6H PRN  pantoprazole (PROTONIX) delayed release tablet, 20 mg, Oral, Daily before Breakfast          Physical Exam  BP (!) 115/51 Comment: rn notified  Pulse 57   Temp 37.1 C (98.8 F)   Resp 16   Ht 1.626 m (5\' 4" )   Wt 76.2 kg (167 lb 15.9 oz)   SpO2 92%   BMI 28.84 kg/m       Constitutional: Alert and oriented x 3, appears chronically ill and mildly uncomfortable due to pain.   Eyes: Conjunctiva clear. Pupils equal and round. Sclera non-icteric.   ENT: Posterior oropharynx without exudate or erythema, mucous membranes moist. Thrush continues to be improving.  Voice is hoarse  Neck: Supple. Trachea midline. No JVD.   Respiratory: Clear to auscultation bilaterally. Normal effort.   Cardiovascular: bradycardic, regular   rhythm, S1, S2 normal. No murmur, click, rub or gallop. Chest tube present without air leak.  Gastrointestinal: Soft, non-tender, non distended. Bowel sounds normal in all 4 quadrants.   Genitourinary: Deferred  Musculoskeletal: Normal muscle tone and bulk. No edema, cynaosis, clubbing.   Integumentary:  Skin warm and dry. No rashes or lesions  Neurologic: Grossly non focal. Moves all 4 extremities.  Lymphatic/Immunologic/Hematologic: No lymphadenopathy. No ecchymosis or petechiae.   Psychiatric: Normal affect, mood, and speech.    Labs:    CBC  Diff   Lab Results   Component Value Date/Time    WBC 10.6 05/31/2021 04:50 AM    HGB 11.1 (L) 05/31/2021 04:50 AM    HCT 35.7 05/31/2021 04:50 AM    PLTCNT 147 (L) 05/31/2021 04:50 AM    RBC 4.34  05/31/2021 04:50 AM    MCV 82.3 05/31/2021 04:50 AM    MCHC 31.1 05/31/2021 04:50 AM    MCH 25.6 (L) 05/31/2021 04:50 AM    MPV 11.4 05/31/2021 04:50 AM    Lab Results   Component Value Date/Time    PMNS 77 05/31/2021 04:50 AM    MONOCYTES 10 05/31/2021 04:50 AM    BASOPHILS 0 05/31/2021 04:50 AM    BASOPHILS <0.10 05/31/2021 04:50 AM    PMNABS 8.18 (H) 05/31/2021 04:50 AM    LYMPHSABS 1.23 05/31/2021 04:50 AM    EOSABS <0.10 05/31/2021 04:50 AM    MONOSABS 1.06 05/31/2021 04:50 AM          Comprehensive Metabolic Profile    Lab Results   Component Value Date/Time    SODIUM 133 (L) 05/31/2021 04:50 AM    POTASSIUM 3.3 (L) 05/31/2021 04:50 AM    CHLORIDE 93 (L) 05/31/2021 04:50 AM    CO2 35 (H) 05/31/2021 04:50 AM    ANIONGAP 5 05/31/2021 04:50 AM    BUN 31 (H) 05/31/2021 04:50 AM    CREATININE 0.67 05/31/2021 04:50 AM    GLUCOSE Negative 05/29/2021 07:53 PM    Lab Results   Component Value Date/Time    CALCIUM 8.6 (L) 05/31/2021 04:50 AM    PHOSPHORUS 4.9 (H) 05/28/2021 07:10 AM    ALBUMIN 3.4 05/28/2021 07:10 AM    TOTALPROTEIN 6.3 05/28/2021 07:10 AM    ALKPHOS 73 05/28/2021 07:10 AM    AST 20 05/28/2021 07:10 AM    ALT 18 05/28/2021 07:10 AM    TOTBILIRUBIN 0.9 05/28/2021 07:10 AM        CARDIAC MARKERS  Lab Results   Component Value Date    TROPONINI 18 05/28/2021            Radiology:   Last CXR Impression   XR AP MOBILE CHEST (Exam End: 06/01/2021  1:10 PM)    Impression    Status post right VATS.    Small bilateral pleural effusions and mild bibasilar atelectasis.    Interval placement of a right large bore chest tube.   XR CHEST AP AND LATERAL (Exam End: 05/28/2021  8:51 AM)    Impression    Mild pulmonary edema pattern.       Results for orders placed during the hospital encounter of 05/28/21    TRANSTHORACIC ECHOCARDIOGRAM - ADULT 05/28/2021 12:14 PM    Narrative  **See full report in linked PDF document**    Transthoracic Echocardiographic  Report  ______________________________________________________________________________  Name: Misty Johnson, Misty Johnson                         MRN: [id number]               Weight: 168 lb  Study Date: 05/28/2021 08:14 AM              DOB: December 04, 1950             Height: 64 in  Gender: Female                               Age: 31 yrs                 BSA: 1.8 m2  Accession #: [id number]                    BP: 138/82 mmHg  Patient Location: [facility] [place]  Ordering Provider: Adline Mango  Tech: [id number]    ______________________________________________________________________________  Procedure:  Transthoracic complete echo with contrast, 2D, spectral and tissue Doppler, color flow Doppler, M-mode.    Quality:  Technically difficult study due to limited acoustic windows.    Indications: Dyspnea,Respiratory failure (CMS HCC),Heart failure (CMS HCC),CAD (coronary artery disease)    Conclusions:  Left Ventricle: Normal left ventricular size. The left ventricular ejection fraction by visual assessment is estimated to be 65%. Regional wall motion abnormalities cannot be excluded due to limited  visualization. Left ventricular diastolic function is indeterminate.  Right Ventricle: Normal right ventricular size. Normal right ventricular systolic function.  Left Atrium: The left atrium is normal in size.  Right Atrium: The right atrium is of normal size.    Findings  Left Ventricle:   Normal left ventricular size. The left ventricular ejection fraction by visual assessment is estimated to be 65%. Left ventricular systolic function is normal. Regional wall motion  abnormalities cannot be excluded due to limited visualization. Left ventricular diastolic function is indeterminate.  Right Ventricle:   Normal right ventricular size. Normal right ventricular systolic function. Right ventricular systolic pressure is indeterminate.  Left Atrium:   The left atrium is normal in size.  Right Atrium:   The right atrium is of normal size.  Mitral  Valve:   The mitral valve is not well visualized. No significant mitral regurgitation present.  Tricuspid Valve:   The tricuspid valve is not well visualized. No significant tricuspid regurgitation present.  Aortic Valve:   The aortic valve is not well visualized. No Aortic valve stenosis. No significant aortic regurgitation present.  Pulmonic Valve:   The pulmonic valve is not well visualized.  Pulmonary Artery:   The pulmonary artery is not well visualized.  Atrial Septum:   The interatrial septum is not well visualized.  IVC/Hepatic Veins:   The inferior vena cava was not visualized.  Aorta:   The aortic root is of normal size.  Pericardium/Pleural space:   Normal pericardium with no pericardial effusion.    Electronically signed by: MD Thomos Lemons on 05/28/2021 12:14 PM              Assessment/ Plan:   Active Hospital Problems    Diagnosis   . Primary Problem: Mediastinal mass       Latorie Montesano is a 70 y.o., White female with medical history of CAD, HFrEF, COPD and chronic hypoxic respiratory failure on 3L NC at home who presented to [facility] due to having SOB and hoarseness for the last few days found to have COPD exacerbation and mediastinal mass.    Acute COPD exacerbation, improving  Chronic hypoxic respiratory failure  - Duonebs schedule with albuterol PRN  - Pulmonary toilet  - Symbicort BID  - Prednisone 40 mg daily for 5 days (end date 11/29)  - Doxycycline 40 mg daily for 5 days (end date 11/29)  - Continue with 3L NC with goal SpO2 88%+    Mediastinal mass and hoarseness  - thoracic surgery following,s/p R VATS robotic assisted anterior mediastinal mass resection   - CT scan images are uploaded on [employer]  - LDH 229, HCG 10  - myasthenia panel pending  - morphine PCA    Thrush   - nystatin swish and swallow x 14 days    Acute urinary retention  - check UA, urine culture with candida glabarata  - bladder scan TID   - foley present from surgery    CAD  HFrEF  - Continue  Lipitor  - hold ASA, will resume once cleared by thoracics  - TTE with EF of 65%. Technically difficult study due to limited acoustic windows.     Mood disorder  - Continue lexapro    GERD  - continue PPI    Body mass index is 28.84 kg/m.    PT/OT: following, recommending home with 24/7 assist    Consults: thoracic surgery    Hardware (lines, foley's, tubes):  Patient Lines/Drains/Airways Status     Active Line / Dialysis Catheter / Dialysis Graft / Drain / Airway / Wound     Name Placement date Placement time Site Days    Peripheral IV Ultrasound guided Left Forearm 06/01/21  1030  -- less than 1    Peripheral IV Distal;Right Basilic  (medial side of arm) 06/01/21  1200  -- less than 1    Chest Tube Right;Mediastinal 06/01/21  --  -- less than 1    Foley Catheter 05/30/21  2235  -- 1    Surgical Incision Right;Anterior;Lateral Chest 06/01/21  1115  -- less than 1                 DVT/PE Prophylaxis: Lovenox (on hold)    Diet: Regular    Disposition Planning: TBD    Code Status: Full Code       Total visit time was 20 minutes. I spent greater than 50% of the time counseling the patient face-to-face regarding the diagnosis and management plan and/or on the unit coordinating the patient's care. The coordination of care involved services directly related to patient care, including reviewing data, obtaining relevant patient information, and discussing the case with other involved healthcare providers.     Carrolyn Leigh, DO  Pager [phone]

## 2021-06-01 NOTE — Nurses Notes (Signed)
Patient arrived to pre post. Prepped and hooked to monitor.

## 2021-06-01 NOTE — Nurses Notes (Signed)
Patient is A&O x4. VS stable. Fall precautions maintained. Patient rated pain at high as a 8/10 PCA infusing. Patient negative for n/v and diarrhea. Assessment per Flowsheet. Patient has no other complaints at this time.

## 2021-06-02 ENCOUNTER — Inpatient Hospital Stay (HOSPITAL_COMMUNITY): Payer: Medicare Other

## 2021-06-02 DIAGNOSIS — Z9889 Other specified postprocedural states: Secondary | ICD-10-CM

## 2021-06-02 DIAGNOSIS — J939 Pneumothorax, unspecified: Secondary | ICD-10-CM

## 2021-06-02 DIAGNOSIS — R222 Localized swelling, mass and lump, trunk: Secondary | ICD-10-CM

## 2021-06-02 DIAGNOSIS — Z978 Presence of other specified devices: Secondary | ICD-10-CM

## 2021-06-02 DIAGNOSIS — R918 Other nonspecific abnormal finding of lung field: Secondary | ICD-10-CM

## 2021-06-02 DIAGNOSIS — J984 Other disorders of lung: Secondary | ICD-10-CM

## 2021-06-02 LAB — CBC
HCT: 39.6 % (ref 34.8–46.0)
HGB: 11.9 g/dL (ref 11.5–16.0)
MCH: 25.6 pg — ABNORMAL LOW (ref 26.0–32.0)
MCHC: 30.1 g/dL — ABNORMAL LOW (ref 31.0–35.5)
MCV: 85.2 fL (ref 78.0–100.0)
MPV: 12.3 fL (ref 8.7–12.5)
PLATELETS: 176 10*3/uL (ref 150–400)
RBC: 4.65 10*6/uL (ref 3.85–5.22)
RDW-CV: 16.6 % — ABNORMAL HIGH (ref 11.5–15.5)
WBC: 13.3 10*3/uL — ABNORMAL HIGH (ref 3.7–11.0)

## 2021-06-02 MED ORDER — KETOROLAC 30 MG/ML (1 ML) INJECTION SOLUTION
15.0000 mg | Freq: Four times a day (QID) | INTRAMUSCULAR | Status: AC
Start: 2021-06-02 — End: 2021-06-05
  Administered 2021-06-02 – 2021-06-03 (×2): 0 mg via INTRAVENOUS
  Administered 2021-06-03 (×3): 15 mg via INTRAVENOUS
  Administered 2021-06-04: 0 mg via INTRAVENOUS
  Administered 2021-06-04: 17:00:00 15 mg via INTRAVENOUS
  Administered 2021-06-04: 06:00:00 0 mg via INTRAVENOUS
  Administered 2021-06-04: 13:00:00 15 mg via INTRAVENOUS
  Administered 2021-06-05 (×3): 0 mg via INTRAVENOUS
  Filled 2021-06-02 (×6): qty 1

## 2021-06-02 MED ORDER — OXYCODONE 5 MG TABLET
5.0000 mg | ORAL_TABLET | ORAL | Status: DC | PRN
Start: 2021-06-02 — End: 2021-06-08

## 2021-06-02 MED ORDER — OXYCODONE 5 MG TABLET
10.0000 mg | ORAL_TABLET | ORAL | Status: DC | PRN
Start: 2021-06-02 — End: 2021-06-08
  Administered 2021-06-02 – 2021-06-04 (×4): 10 mg via ORAL
  Filled 2021-06-02 (×4): qty 2

## 2021-06-02 MED ORDER — HYDROMORPHONE 1 MG/ML INJECTION WRAPPER
0.5000 mg | INJECTION | INTRAMUSCULAR | Status: DC | PRN
Start: 2021-06-02 — End: 2021-06-06
  Administered 2021-06-02: 0.5 mg via INTRAVENOUS
  Filled 2021-06-02: qty 1

## 2021-06-02 MED ORDER — KETOROLAC 30 MG/ML (1 ML) INJECTION SOLUTION
15.0000 mg | Freq: Four times a day (QID) | INTRAMUSCULAR | Status: AC | PRN
Start: 2021-06-02 — End: 2021-06-05

## 2021-06-02 NOTE — Progress Notes (Signed)
[facility]  Thoracic Surgery   Progress Note      Simon, Aaberg  Date of Admission:  05/28/2021  Date of Birth:  02-24-1951    Hospital Day:  LOS: 5 days   Date of Service:  06/02/2021    Subjective   No acute events overnight. Breathing stable. Resting comfortably in bed in no acute distress. Chest tube drained 35cc.    Objective      Vital Signs:  Temp (24hrs) Max:37.1 C (98.8 F)      Temperature: 36.6 C (97.9 F)  BP (Non-Invasive): (!) 117/50 (rn notified)  MAP (Non-Invasive): 70 mmHG  Heart Rate: 52  Respiratory Rate: 16  SpO2: 97 %    Base (Admission) Weight:  Weight: 76.2 kg (167 lb 15.9 oz)  Weight:  Weight: 76.2 kg (167 lb 15.9 oz)      Current Medications:  acetaminophen (TYLENOL) tablet, 650 mg, Oral, Q4H PRN  albuterol (PROVENTIL) 2.5 mg / 3 mL (0.083%) neb solution, 2.5 mg, Nebulization, Q4H PRN  [Held by provider] aspirin chewable tablet 81 mg, 81 mg, Oral, Daily  atorvastatin (LIPITOR) tablet, 40 mg, Oral, QPM  budesonide-formoterol (SYMBICORT) 160 mcg-4.5 mcg per inhalation oral inhaler - "Nursing to administer", 2 Puff, Inhalation, 2x/day  calcium carbonate (TUMS) 500mg  (200mg  elemental calcium) chewable tablet, 500 mg, Oral, 3x/day PRN  D5W 250 mL flush bag, , Intravenous, Q15 Min PRN  D5W 250 mL flush bag, , Intravenous, Q15 Min PRN  [Held by provider] enoxaparin PF (LOVENOX) 40 mg/0.4 mL SubQ injection, 40 mg, Subcutaneous, Daily  escitalopram (LEXAPRO) tablet, 20 mg, Oral, Daily  guaiFENesin 100mg  per 35mL oral liquid - for cough (expectorant), 200 mg, Oral, Q4H PRN  loratadine (CLARITIN) tablet, 10 mg, Oral, Daily  morphine 1 mg/mL in NS PCA, , Intravenous, Continuous  NS 250 mL flush bag, , Intravenous, Q15 Min PRN  NS 250 mL flush bag, , Intravenous, Q15 Min PRN  NS flush syringe, 2-6 mL, Intracatheter, Q8HRS  NS flush syringe, 2-6 mL, Intracatheter, Q1 MIN PRN  NS flush syringe, 2-6 mL, Intracatheter, Q8HRS  NS flush syringe, 2-6 mL, Intracatheter, Q1 MIN PRN  nystatin  (MYCOSTATIN) 100,000 units per mL oral liquid, 5 mL, Swish & Swallow, 4x/day  ondansetron (ZOFRAN) 2 mg/mL injection, 4 mg, Intravenous, Q6H PRN  pantoprazole (PROTONIX) delayed release tablet, 20 mg, Oral, Daily before Breakfast      Appropriate Home Meds restarted:   Per primary team    I/O:  I/O last 24 hours to current time:      Intake/Output Summary (Last 24 hours) at 06/02/2021 0730  Last data filed at 06/02/2021 0706  Gross per 24 hour   Intake 1655 ml   Output 809 ml   Net 846 ml     I/O last 3 completed shifts:  11/30 0700 - 11/30 1859  In: 3 [I.V.:3]  Out: -       In: 1652 [P.O.:350; I.V.:1302]  Out: 809 [Urine:775; Chest Tube:34]    Nutrition/Diet:  MNT PROTOCOL FOR DIETITIAN  DIETARY ORAL SUPPLEMENTS Oral Supplements with tray: Magic Cup-Orange; LUNCH/DINNER; 1 Each  ROOM SERVICE:  NEEDS VISIT FOR MENU CHOICES  DIET REGULAR Calorie amount: CC 2200    Labs:  Reviewed:  I have reviewed all lab results.  Lab Results Today:    CBC with Diff (Last 24 Hours):    No results for input(s): WBC, HGB, HCT, MCV, PLTCNT, BANDS, PMNS, LYMPHO, LYMPHOCYTES, MONOCYTES, EOSINO, EOSINOPHIL, BASOPHILS in the last 24  hours.      Radiology:    Reviewed:   No new imaging    Physical Exam:  Constitutional:  no distress and vital signs reviewed  Eyes:  Conjunctiva clear.  ENT:  Mouth mucous membranes moist.   Neck:  supple, symmetrical, trachea midline  Respiratory:  No respiratory distress. On nasal cannula  Cardiovascular:  regular rate and rhythm  Musculoskeletal:  Head atraumatic and normocephalic  Integumentary:  Skin warm and dry  Neurologic:  Grossly normal          ASSESSMENT:  Mediastinal mass  COPD on nasal cannula at baseline    Problem List:  Active Hospital Problems    Diagnosis    Primary Problem: Mediastinal mass       PT/OT:  Per primary team    Disposition Planning:  Per primary team      PLAN:  -MG labs pending  -Continue chest tube to WS   -Rest of care per primary team  -Thank you for this consult. We will  continue to follow.    I independently of the faculty provider spent a total of (10) minutes in direct/indirect care of this patient including initial evaluation, review of laboratory, radiology, diagnostic studies, review of medical record, order entry and coordination of care.  Jenel Lucks, PA-C  06/02/2021, 07:30  I personally saw and evaluated the patient. See [name] note for additional details.   Cyndia Diver, MD

## 2021-06-02 NOTE — Progress Notes (Signed)
[facility]  Medicine Progress Note    Misty Johnson  Date of service: 06/02/2021  Date of Admission:  05/28/2021    Hospital Day:  LOS: 5 days       Assessment/ Plan:   Active Hospital Problems    Diagnosis    Primary Problem: Mediastinal mass       Misty Johnson a 70 y.o.,[place] medical history of CAD, HFrEF, COPD and chronic hypoxic respiratory failure on 3L NC at home who presented to [facility] due to having SOB and hoarseness for the last few daysfound to have COPD exacerbation and mediastinal mass.    Chronic hypoxic respiratory failure 2/2 COPD - initially in acute COPD exacerbation but has improved  - Duonebs schedule with albuterol PRN  - Pulmonary toilet  - Symbicort BID  - Prednisone 40 mg daily for 5 days (end date 11/29)  - Doxycycline 40 mg daily for 5 days (end date 11/29)  - Continue with 3L NC with goal SpO2 88%+    Mediastinal mass and hoarseness  - thoracic surgery following,s/p R VATS robotic assisted anterior mediastinal mass resection, follow up path  - CT scan images are uploaded on Syn[employer]  - LDH 229, HCG 10  - myasthenia panel pending  - change morphine PCA to PRN PO and IV narcotics    Thrush - nystatin swish and swallow x 14 days    Acute urinary retention  - urine culture with candida glabarata  - bladder scan TID   - foley present from surgery    CAD   HFrEF  - Continue Lipitor  - hold ASA, will resume once cleared by thoracics  - TTE with EF of 65%. Technically difficult study due to limited acoustic windows.     Mood disorder  - Continue lexapro    GERD  - continue PPI    Body mass index is 28.84 kg/m.    Hardware (lines, foley's, tubes):  Patient Lines/Drains/Airways Status     Active Line / Dialysis Catheter / Dialysis Graft / Drain / Airway / Wound     Name Placement date Placement time Site Days    Peripheral IV Ultrasound guided Left Forearm 06/01/21  1030  -- 1    Peripheral IV Distal;Right Basilic  (medial side of arm)  06/01/21  1200  -- 1    Chest Tube Right;Mediastinal 06/01/21  --  -- 1    Foley Catheter 05/30/21  2235  -- 2    Surgical Incision Right;Anterior;Lateral Chest 06/01/21  1115  -- 1                 DVT/PE Prophylaxis: holding    Disposition Planning: IPR vs home    Murtis Sink, MD  [facility]  Assistant Professor of Medicine  Pager [phone]    ____________________________________________________________________________________________________________________________________________________________________    Chief Complaint: mediastinal mass  Subjective: NAEO. Sitting in chair. Reports some pain at chest tube site. tolerating diet. Denied cp, sob, nausea, vomiting, abd pain.     Called daughter but unable to reach via phone.    Vital Signs:  Temp  Avg: 36.6 C (97.8 F)  Min: 36.3 C (97.3 F)  Max: 36.9 C (98.4 F)    Pulse  Avg: 54.5  Min: 50  Max: 64 BP  Min: 115/50  Max: 153/47   Resp  Avg: 16.2  Min: 16  Max: 17 SpO2  Avg: 95.2 %  Min: 92 %  Max: 97 %  Input/Output    Intake/Output Summary (Last 24 hours) at 06/02/2021 1655  Last data filed at 06/02/2021 1557  Gross per 24 hour   Intake 550 ml   Output 638 ml   Net -88 ml    I/O last shift:  11/30 0700 - 11/30 1859  In: 198 [P.O.:195; I.V.:3]  Out: 354 [Urine:250; Chest Tube:104]   acetaminophen (TYLENOL) tablet, 650 mg, Oral, Q4H PRN  albuterol (PROVENTIL) 2.5 mg / 3 mL (0.083%) neb solution, 2.5 mg, Nebulization, Q4H PRN  [Held by provider] aspirin chewable tablet 81 mg, 81 mg, Oral, Daily  atorvastatin (LIPITOR) tablet, 40 mg, Oral, QPM  budesonide-formoterol (SYMBICORT) 160 mcg-4.5 mcg per inhalation oral inhaler - "Nursing to administer", 2 Puff, Inhalation, 2x/day  calcium carbonate (TUMS) 500mg  (200mg  elemental calcium) chewable tablet, 500 mg, Oral, 3x/day PRN  D5W 250 mL flush bag, , Intravenous, Q15 Min PRN  D5W 250 mL flush bag, , Intravenous, Q15 Min PRN  [Held by provider] enoxaparin PF (LOVENOX) 40 mg/0.4 mL SubQ injection, 40 mg,  Subcutaneous, Daily  escitalopram (LEXAPRO) tablet, 20 mg, Oral, Daily  guaiFENesin 100mg  per 77mL oral liquid - for cough (expectorant), 200 mg, Oral, Q4H PRN  HYDROmorphone (DILAUDID) 1 mg/mL injection, 0.5 mg, Intravenous, Q3H PRN  ketorolac (TORADOL) 30 mg/mL injection, 15 mg, Intravenous, Q6H PRN  loratadine (CLARITIN) tablet, 10 mg, Oral, Daily  NS 250 mL flush bag, , Intravenous, Q15 Min PRN  NS 250 mL flush bag, , Intravenous, Q15 Min PRN  NS flush syringe, 2-6 mL, Intracatheter, Q8HRS  NS flush syringe, 2-6 mL, Intracatheter, Q1 MIN PRN  NS flush syringe, 2-6 mL, Intracatheter, Q8HRS  NS flush syringe, 2-6 mL, Intracatheter, Q1 MIN PRN  nystatin (MYCOSTATIN) 100,000 units per mL oral liquid, 5 mL, Swish & Swallow, 4x/day  ondansetron (ZOFRAN) 2 mg/mL injection, 4 mg, Intravenous, Q6H PRN  oxyCODONE (ROXICODONE) immediate release tablet, 5 mg, Oral, Q4H PRN   Or  oxyCODONE (ROXICODONE) immediate release tablet, 10 mg, Oral, Q4H PRN  pantoprazole (PROTONIX) delayed release tablet, 20 mg, Oral, Daily before Breakfast          Physical Exam  BP (!) 153/47 Comment: RN notified   Pulse 56    Temp 36.6 C (97.9 F)    Resp 16    Ht 1.626 m (5\' 4" )    Wt 76.2 kg (167 lb 15.9 oz)    SpO2 94%    BMI 28.84 kg/m     Constitutional: Alert and oriented x 3, NAD, appears age appropriate  Eyes: Conjunctiva clear, Sclera non-icteric.   ENT: ENMT without erythema or injection, mucous membranes moist.  Neck: supple, symmetrical, trachea midline  Respiratory: Clear to auscultation bilaterally. No wheeze or rales.   Cardiovascular: regular rate and rhythm, S1, S2 normal, no murmur, click, rub or gallop  Gastrointestinal: Soft, non-tender, Bowel sounds normal, non-distended  Genitourinary: Deferred  Musculoskeletal: Head atraumatic and normocephalic. R sided chest tube. No significant edema BLE  Integumentary:  Skin warm and dry, No rashes and No lesions  Neurologic: normal strength and tone. No  tremor  Lymphatic/Immunologic/Hematologic: No lymphadenopathy  Psychiatric: Normal affect, behavior    Labs:    CBC  Diff   Lab Results   Component Value Date/Time    WBC 13.3 (H) 06/02/2021 09:06 AM    HGB 11.9 06/02/2021 09:06 AM    HCT 39.6 06/02/2021 09:06 AM    PLTCNT 176 06/02/2021 09:06 AM    RBC 4.65 06/02/2021 09:06 AM  MCV 85.2 06/02/2021 09:06 AM    MCHC 30.1 (L) 06/02/2021 09:06 AM    MCH 25.6 (L) 06/02/2021 09:06 AM    MPV 12.3 06/02/2021 09:06 AM    Lab Results   Component Value Date/Time    PMNS 77 05/31/2021 04:50 AM    MONOCYTES 10 05/31/2021 04:50 AM    BASOPHILS 0 05/31/2021 04:50 AM    BASOPHILS <0.10 05/31/2021 04:50 AM    PMNABS 8.18 (H) 05/31/2021 04:50 AM    LYMPHSABS 1.23 05/31/2021 04:50 AM    EOSABS <0.10 05/31/2021 04:50 AM    MONOSABS 1.06 05/31/2021 04:50 AM          Comprehensive Metabolic Profile    Lab Results   Component Value Date/Time    SODIUM 133 (L) 05/31/2021 04:50 AM    POTASSIUM 3.3 (L) 05/31/2021 04:50 AM    CHLORIDE 93 (L) 05/31/2021 04:50 AM    CO2 35 (H) 05/31/2021 04:50 AM    ANIONGAP 5 05/31/2021 04:50 AM    BUN 31 (H) 05/31/2021 04:50 AM    CREATININE 0.67 05/31/2021 04:50 AM    GLUCOSE Negative 05/29/2021 07:53 PM    Lab Results   Component Value Date/Time    CALCIUM 8.6 (L) 05/31/2021 04:50 AM    PHOSPHORUS 4.9 (H) 05/28/2021 07:10 AM    ALBUMIN 3.4 05/28/2021 07:10 AM    TOTALPROTEIN 6.3 05/28/2021 07:10 AM    ALKPHOS 73 05/28/2021 07:10 AM    AST 20 05/28/2021 07:10 AM    ALT 18 05/28/2021 07:10 AM    TOTBILIRUBIN 0.9 05/28/2021 07:10 AM        CARDIAC MARKERS  Lab Results   Component Value Date    TROPONINI 18 05/28/2021            Radiology:   Last CXR Impression   XR AP MOBILE CHEST (Exam End: 06/02/2021  5:36 AM)    Impression    Volume loss right hemithorax with right basilar opacity likely representing atelectasis. Right-sided chest tube without significant volume of thorax. No detrimental interval change from prior.   XR CHEST AP AND LATERAL (Exam End:  05/28/2021  8:51 AM)    Impression    Mild pulmonary edema pattern.       Results for orders placed during the hospital encounter of 05/28/21    TRANSTHORACIC ECHOCARDIOGRAM - ADULT 05/28/2021 12:14 PM    Narrative  **See full report in linked PDF document**    Transthoracic Echocardiographic Report    ______________________________________________________________________________  Name: ADELI, FROST                         MRN: [id number]               Weight: 168 lb  Study Date: 05/28/2021 08:14 AM              DOB: 06-03-1951             Height: 64 in  Gender: Female                               Age: 12 yrs                 BSA: 1.8 m2  Accession #: [id number]                    BP: 138/82 mmHg  Patient Location: [facility] [place]  Ordering Provider: Adline Mango  Tech: [id number]    ______________________________________________________________________________  Procedure:  Transthoracic complete echo with contrast, 2D, spectral and tissue Doppler, color flow Doppler, M-mode.    Quality:  Technically difficult study due to limited acoustic windows.    Indications: Dyspnea,Respiratory failure (CMS HCC),Heart failure (CMS HCC),CAD (coronary artery disease)    Conclusions:  Left Ventricle: Normal left ventricular size. The left ventricular ejection fraction by visual assessment is estimated to be 65%. Regional wall motion abnormalities cannot be excluded due to limited  visualization. Left ventricular diastolic function is indeterminate.  Right Ventricle: Normal right ventricular size. Normal right ventricular systolic function.  Left Atrium: The left atrium is normal in size.  Right Atrium: The right atrium is of normal size.    Findings  Left Ventricle:   Normal left ventricular size. The left ventricular ejection fraction by visual assessment is estimated to be 65%. Left ventricular systolic function is normal. Regional wall motion  abnormalities cannot be excluded due to limited visualization. Left ventricular  diastolic function is indeterminate.  Right Ventricle:   Normal right ventricular size. Normal right ventricular systolic function. Right ventricular systolic pressure is indeterminate.  Left Atrium:   The left atrium is normal in size.  Right Atrium:   The right atrium is of normal size.  Mitral Valve:   The mitral valve is not well visualized. No significant mitral regurgitation present.  Tricuspid Valve:   The tricuspid valve is not well visualized. No significant tricuspid regurgitation present.  Aortic Valve:   The aortic valve is not well visualized. No Aortic valve stenosis. No significant aortic regurgitation present.  Pulmonic Valve:   The pulmonic valve is not well visualized.  Pulmonary Artery:   The pulmonary artery is not well visualized.  Atrial Septum:   The interatrial septum is not well visualized.  IVC/Hepatic Veins:   The inferior vena cava was not visualized.  Aorta:   The aortic root is of normal size.  Pericardium/Pleural space:   Normal pericardium with no pericardial effusion.    Electronically signed by: MD Thomos Lemons on 05/28/2021 12:14 PM

## 2021-06-02 NOTE — Care Plan (Signed)
[facility]  Rehabilitation Services  Occupational Therapy Progress Note    Patient Name: Misty Johnson  Date of Birth: May 18, 1951  Height:  162.6 cm (5\' 4" )  Weight:  76.2 kg (167 lb 15.9 oz)  Room/Bed: 904/A  Payor: MEDICARE / Plan: MEDICARE PART A AND B / Product Type: Medicare /     Assessment:    OT tx/reassessment at b/s this am.  Pt with pain associated with procedure and further weakness, etc.  She does not appear to be functioning at a level conducive with return home even with family support and home health.  Will follow during hospitalization.  Consider inpt rehab to facilitate d/c home with support of son.      Discharge Needs:   Equipment Recommendation: to be determined      Discharge Disposition:  inpatient rehabilitation facility     JUSTIFICATION OF DISCHARGE RECOMMENDATION   Based on current diagnosis, functional performance prior to admission, and current functional performance, this patient requires continued OT services in inpatient rehabilitation facility in order to achieve significant functional improvements.    Plan:   Continue to follow patient according to established plan of care.  The risks/benefits of therapy have been discussed with the patient/caregiver and he/she is in agreement with the established plan of care.     Subjective & Objective:      06/02/21 1002   Therapist Pager   OT Assigned/ Pager # Mylon Mabey [phone]   Rehab Session   Document Type therapy progress note (daily note)   Total OT Minutes: 16   Patient Effort adequate   Symptoms Noted During/After Treatment increased pain   General Information   Patient Profile Reviewed yes   Pertinent History of Current Functional Problem 70 yo F admitted with COPD exacerbation and recently discovered mediastinal mass. She is scheduled for a procdure to resect the mass, today. PMH includes CAD, COPD with home O2 3 L/min, CHF.  Pt s/p VATS 06/01/2021   Medical Lines PIV Line;Telemetry;Chest Tube   Respiratory Status nasal cannula    Existing Precautions/Restrictions fall precautions;full code;oxygen therapy device and L/min   Pre Treatment Status   Pre Treatment Patient Status Patient supine in bed;Call light within reach;Telephone within reach;Sitter select activated;Nurse approved session   Support Present Pre Treatment  None   Communication Pre Treatment  Nurse   Mutuality/Individual Preferences   Individualized Care Needs OOB with assist of 1-2 to recliner or b/s commode   Plan of Care Reviewed With patient   Vital Signs   O2 Delivery Pre Treatment supplemental O2   O2 Delivery Post Treatment supplemental O2   Pain Assessment   Pre/Posttreatment Pain Comment c/o significant pain near incision site and chest tube- RN notified   Coping/Psychosocial   Observed Emotional State calm;cooperative   Verbalized Emotional State acceptance   Coping/Psychosocial Response Interventions   Plan Of Care Reviewed With patient   Trust Relationship/Rapport care explained;choices provided;questions encouraged;questions answered;reassurance provided;thoughts/feelings acknowledged   Cognitive Assessment/Interventions   Behavior/Mood Observations alert;cooperative   Orientation Status oriented x 4   Attention WNL/WFL   Follows Commands WNL   Bed Mobility Assessment/Treatment   Bed Mobility, Assistive Device Head of Bed Elevated   Supine-Sit Independence minimum assist (75% patient effort)   Transfer Assessment/Treatment   Sit-Stand Independence minimum assist (75% patient effort)   Stand-Sit Independence minimum assist (75% patient effort)   Sit-Stand-Sit, Assist Device handheld assist   Bed-Chair Independence minimum assist (75% patient effort)   Bed-Chair-Bed Assist Device  handheld assist   Gait Assessment/Treatment   Total Distance Ambulated 4   Independence  minimum assist (75% patient effort);2 person assist required   Assistive Device  hand-held assistance   Gait Speed slow   Upper Body Dressing Assessment/Training   Position  sitting   Independence Level   maximum assist (25% patient effort)   Comment gown and robe   Lower Body Dressing Assessment/Training   Position sitting   Independence Level  dependent (less than 25% patient effort)   Comment slipper socks   Balance Skill Training   Sitting Balance: Static fair balance   Sitting, Dynamic (Balance) fair - balance   Sit-to-Stand Balance fair - balance   Standing Balance: Static fair - balance   Standing Balance: Dynamic fair - balance   Post Treatment Status   Post Treatment Patient Status Patient sitting in bedside chair or w/c;Call light within reach;Telephone within reach;Armed forces logistics/support/administrative officer Treatment Comment pt performance   Clinical Impression   Functional Level at Time of Session OT tx/reassessment at b/s this am.  Pt with pain associated with procedure and further weakness, etc.  She does not appear to be functioning at a level conducive with return home even with family support and home health.  Will follow during hospitalization.  Consider inpt rehab to facilitate d/c home with support of son.   Anticipated Equipment Needs at Discharge to be determined   Anticipated Discharge Disposition inpatient rehabilitation facility   Highest level of Mobility score   Exercise/Activity Level Performed 4- Transferred to chair/commode       Therapist:   Linard Millers, OT  Pager #: [phone]

## 2021-06-02 NOTE — Nurses Notes (Signed)
Medications administered per order. Assessment made, patient alert and oriented x4. No complaints of N/V reported at this time. Pain 7/10 at incision, PCA pump running per MAR. Plan of care discussed with patient and patient expressed understanding. Patient left with call bell and personal items within reach and bed in low position. Will continue to monitor.

## 2021-06-02 NOTE — Nurses Notes (Addendum)
Patient is alert and oriented but lethargic. Medications reviewed and given, no questions at this time. Assessment completed per flow sheet. Denies pain or N/V/D. Reports shortness of breath, oxygen on and oxygen saturation WDL. Bed in lowest position, all precautions maintained. Call bell and personal items within reach. No additional needs at this time, will continue to monitor.    Patient's lungs had increased expiratory wheezing. Respiratory and service paged. Will continue to monitor.    Martin Majestic, RN

## 2021-06-02 NOTE — Care Plan (Signed)
[facility]  Rehabilitation Services  Physical Therapy Progress Note      Patient Name: Misty Johnson  Date of Birth: 02-20-1951  Height:  162.6 cm (5\' 4" )  Weight:  76.2 kg (167 lb 15.9 oz)  Room/Bed: 904/A  Payor: MEDICARE / Plan: MEDICARE PART A AND B / Product Type: Medicare /     Assessment:     Pt performed adequately. All transfers, HHA, MinA. Gait, HHA, MinAx2 required for 64ft. Pt exhibited increased pain during ambulation, not rated. Pts deficits include decreased activity tolerance, weakness, fatigue, increased pain, decreased balance. Cont to anticipate home w/ 24/7A unless progress is not made prior to d/c.    Discharge Needs:   Equipment Recommendation: none anticipated    Discharge Disposition: home with 24/7 assistance (vs IPR if her progress is slow post procedure.)    JUSTIFICATION OF DISCHARGE RECOMMENDATION   Based on current diagnosis, functional performance prior to admission, and current functional performance, this patient requires continued PT services in home with 24/7 assistance (vs IPR if her progress is slow post procedure.) in order to achieve significant functional improvements in these deficit areas: aerobic capacity/endurance, gait, locomotion, and balance, muscle performance.      Plan:   Continue to follow patient according to established plan of care.  The risks/benefits of therapy have been discussed with the patient/caregiver and he/she is in agreement with the established plan of care.     Subjective & Objective:        06/02/21 1001   Therapist Pager   PT Assigned/ Pager # 06/04/21 [phone]   Rehab Session   Document Type therapy progress note (daily note)   Total PT Minutes: 16   Patient Effort adequate   Symptoms Noted During/After Treatment increased pain   Symptoms Noted Comment Pt experienced increased pain at tube site, not rated.   General Information   Patient Profile Reviewed yes   Medical Lines PIV Line;Telemetry;Chest Tube   Respiratory Status nasal cannula    Existing Precautions/Restrictions fall precautions;full code   Mutuality/Individual Preferences   Individualized Care Needs OOB w/ A   Pre Treatment Status   Pre Treatment Patient Status Patient supine in bed;Call light within reach;Telephone within reach;Sitter select activated;Nurse approved session   Support Present Pre Treatment  None   Communication Pre Treatment  Nurse   Communication Pre Treatment Comment RN approved session.   Cognitive Assessment/Interventions   Behavior/Mood Observations behavior appropriate to situation, WNL/WFL   Orientation Status oriented x 4   Attention WNL/WFL   Follows Commands WNL   Vital Signs   O2 Delivery Pre Treatment supplemental O2   O2 Delivery Post Treatment supplemental O2   Vitals Comment VSS   Bed Mobility Assessment/Treatment   Bed Mobility, Assistive Device Head of Bed Elevated;bed rails   Supine-Sit Independence minimum assist (75% patient effort)   Sit to Supine, Independence not tested   Safety Issues decreased use of arms for pushing/pulling;impaired trunk control for bed mobility;decreased use of legs for bridging/pushing   Impairments balance impaired;coordination impaired;endurance;flexibility decreased;pain;postural control impaired;ROM decreased;strength decreased   Comment Sup>Sit, MinA.   Transfer Assessment/Treatment   Sit-Stand Independence minimum assist (75% patient effort)   Stand-Sit Independence minimum assist (75% patient effort)   Sit-Stand-Sit, Assist Device handheld assist   Bed-Chair Independence minimum assist (75% patient effort)   Chair-Bed Independence not tested   Bed-Chair-Bed Assist Device handheld assist   Toilet Transfer Independence not tested   Transfer Safety Issues balance decreased during turns;step  length decreased;weight-shifting ability decreased   Transfer Impairments balance impaired;coordination impaired;endurance;flexibility decreased;pain;postural control impaired;ROM decreased;strength decreased   Transfer Comment All  transfers, HHA, MinA. Pt exhibited increased pain during transfer, not rated.   Gait Assessment/Treatment   Total Distance Ambulated 4   Independence  minimum assist (75% patient effort);2 person assist required   Assistive Device  hand-held assistance   Distance in Feet 12ft   Gait Speed very slow   Deviations  cadence decreased;double stance time increased;step length decreased;weight-shifting ability decreased   Safety Issues  balance decreased during turns;step length decreased;weight-shifting ability decreased   Impairments  balance impaired;coordination impaired;endurance;flexibility decreased;pain;ROM decreased;postural control impaired;strength decreased   Comment Gait, HHA, MinAx2 required for 20ft. Pt exhibited increased pain during ambulation, not rated.   Balance Skill Training   Sitting Balance: Static fair + balance   Sitting, Dynamic (Balance) fair balance   Sit-to-Stand Balance fair - balance   Standing Balance: Static fair - balance   Standing Balance: Dynamic fair - balance   Systems Impairment Contributing to Balance Disturbance musculoskeletal   Identified Impairments Contributing to Balance Disturbance impaired coordination;pain;impaired postural control;decreased ROM;decreased strength   Post Treatment Status   Post Treatment Patient Status Patient sitting in bedside chair or w/c;Call light within reach;Telephone within reach;Media planner Post Treatment Comment Pt performance.   Basic Mobility Am-PAC/6Clicks Score (APPROVED PT Staff, [facility] PT/OT, [facility] ONLY, [facility], [facility], and [facility])   Turning in bed without bedrails 4   Lying on back to sitting on edge of flat bed 3   Moving to and from a bed to a chair 3   Standing up from chair 3   Walk in room 3   Climbing 3-5 steps with railing 2   6 Clicks Raw Score total 18   Standardized (t-scale) score 41.05   CMS 0-100% Score 40.47   CMS Modifier CK    Patient Mobility Goal (JHHLM) 6- Walk 10 steps or more 2X/day   Exercise/Activity Level Performed 6- Walked 10 steps or more  (12ft)   Physical Therapy Clinical Impression   Assessment Pt performed adequately. All transfers, HHA, MinA. Gait, HHA, MinAx2 required for 40ft. Pt exhibited increased pain during ambulation, not rated. Pts deficits include decreased activity tolerance, weakness, fatigue, increased pain, decreased balance. Cont to anticipate home w/ 24/7A unless progress is not made prior to d/c.   Anticipated Equipment Needs at Discharge (PT) none anticipated   Anticipated Discharge Disposition home with 24/7 assistance  (vs IPR if her progress is slow post procedure.)       Therapist:   Rosilyn Mings, PTA   Pager #: [phone]

## 2021-06-03 ENCOUNTER — Inpatient Hospital Stay (HOSPITAL_COMMUNITY): Payer: Medicare Other

## 2021-06-03 DIAGNOSIS — Z9889 Other specified postprocedural states: Secondary | ICD-10-CM

## 2021-06-03 DIAGNOSIS — J90 Pleural effusion, not elsewhere classified: Secondary | ICD-10-CM

## 2021-06-03 DIAGNOSIS — R918 Other nonspecific abnormal finding of lung field: Secondary | ICD-10-CM

## 2021-06-03 DIAGNOSIS — Z978 Presence of other specified devices: Secondary | ICD-10-CM

## 2021-06-03 LAB — CBC WITH DIFF
BASOPHIL #: <0.1 10*3/uL (ref <=0.20)
BASOPHIL %: 0 %
EOSINOPHIL #: <0.1 10*3/uL (ref <=0.50)
EOSINOPHIL %: 0 %
HCT: 34.8 % (ref 34.8–46.0)
HGB: 10.5 g/dL — ABNORMAL LOW (ref 11.5–16.0)
IMMATURE GRANULOCYTE #: 0.1 10*3/uL — ABNORMAL HIGH (ref <0.10)
IMMATURE GRANULOCYTE %: 1 % (ref 0–1)
LYMPHOCYTE #: 1.17 10*3/uL (ref 1.00–4.80)
LYMPHOCYTE %: 13 %
MCH: 26 pg (ref 26.0–32.0)
MCHC: 30.2 g/dL — ABNORMAL LOW (ref 31.0–35.5)
MCV: 86.1 fL (ref 78.0–100.0)
MONOCYTE #: 0.64 10*3/uL (ref 0.20–1.10)
MONOCYTE %: 7 %
NEUTROPHIL #: 7.23 10*3/uL (ref 1.50–7.70)
NEUTROPHIL %: 79 %
PLATELETS: 137 10*3/uL — ABNORMAL LOW (ref 150–400)
RBC: 4.04 10*6/uL (ref 3.85–5.22)
RDW-CV: 16.4 % — ABNORMAL HIGH (ref 11.5–15.5)
WBC: 9.2 10*3/uL (ref 3.7–11.0)

## 2021-06-03 LAB — BASIC METABOLIC PANEL
ANION GAP: 4 mmol/L (ref 4–13)
BUN/CREA RATIO: 42 — ABNORMAL HIGH (ref 6–22)
BUN: 30 mg/dL — ABNORMAL HIGH (ref 8–25)
CALCIUM: 8.4 mg/dL — ABNORMAL LOW (ref 8.8–10.2)
CHLORIDE: 100 mmol/L (ref 96–111)
CO2 TOTAL: 36 mmol/L — ABNORMAL HIGH (ref 23–31)
CREATININE: 0.72 mg/dL (ref 0.60–1.05)
ESTIMATED GFR: 89 mL/min/BSA (ref >=60)
GLUCOSE: 73 mg/dL (ref 65–125)
POTASSIUM: 3.9 mmol/L (ref 3.5–5.1)
SODIUM: 140 mmol/L (ref 136–145)

## 2021-06-03 LAB — PHOSPHORUS: PHOSPHORUS: 2.7 mg/dL (ref 2.3–4.0)

## 2021-06-03 LAB — MAGNESIUM: MAGNESIUM: 2 mg/dL (ref 1.8–2.6)

## 2021-06-03 MED ORDER — PREDNISONE 20 MG TABLET
40.0000 mg | ORAL_TABLET | Freq: Every morning | ORAL | Status: AC
Start: 2021-06-04 — End: 2021-06-06
  Administered 2021-06-04 – 2021-06-06 (×3): 40 mg via ORAL
  Filled 2021-06-03 (×3): qty 2

## 2021-06-03 MED ORDER — PREDNISONE 10 MG TABLET
10.0000 mg | ORAL_TABLET | Freq: Every morning | ORAL | Status: DC
Start: 2021-06-13 — End: 2021-06-08

## 2021-06-03 MED ORDER — PREDNISONE 10 MG TABLET
30.0000 mg | ORAL_TABLET | Freq: Every morning | ORAL | Status: DC
Start: 2021-06-07 — End: 2021-06-08
  Administered 2021-06-07 – 2021-06-08 (×2): 30 mg via ORAL
  Filled 2021-06-03 (×2): qty 1

## 2021-06-03 MED ORDER — PREDNISONE 20 MG TABLET
20.0000 mg | ORAL_TABLET | Freq: Every morning | ORAL | Status: DC
Start: 2021-06-10 — End: 2021-06-08

## 2021-06-03 MED ORDER — PREDNISONE 20 MG TABLET
40.0000 mg | ORAL_TABLET | ORAL | Status: AC
Start: 2021-06-03 — End: 2021-06-03
  Administered 2021-06-03: 07:00:00 40 mg via ORAL
  Filled 2021-06-03: qty 2

## 2021-06-03 NOTE — Care Management Notes (Addendum)
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 S/p R VATS procedure, awaiting pathology. Chest tubes discontinued. PT/OT currently rec home however states recs may change pending pt progress. MSW to continue to follow for d/c planning.    1609  MSW received call from service indicating that pt does not have help in the home. MSW met with pt at bedside to discuss therapy recs and prospect of acute rehab. Pt not agreeable to IRF at this time, states that her daughter can assist at night and her son during the day. MSW updated service, they plan to speak with pt as well. MSW to continue to follow.    The patient will continue to be evaluated for developing discharge needs.     Case Manager: Vicente Serene, [place]  Phone: [phone]

## 2021-06-03 NOTE — Care Plan (Signed)
[facility]  Rehabilitation Services  Physical Therapy Progress Note      Patient Name: Misty Johnson  Date of Birth: 02-Dec-1950  Height:  162.6 cm (5\' 4" )  Weight:  76.2 kg (167 lb 15.9 oz)  Room/Bed: 904/A  Payor: MEDICARE / Plan: MEDICARE PART A AND B / Product Type: Medicare /     Assessment:     Pt performed adequately. Sup>Sit, MinA, HOB elevated. Pt highly limited d/t excrutiating pain in R upper trunk area, not rated. Gait, HHA, ModA required for 51ft from bed to chair. Pts deficits cont to be decreased activity tolerance, weakness, fatigue, decreased balance, and extreme limiting pain. If pts progress does not begin to improve, pts rec's will change, but for now cont to anticipate improved recovery and home w/ 24/7A and HHPT upon d/c of stay.    Discharge Needs:   Equipment Recommendation: none anticipated    Discharge Disposition: home with 24/7 assistance (vs IPR if her progress is slow post procedure.)    JUSTIFICATION OF DISCHARGE RECOMMENDATION   Based on current diagnosis, functional performance prior to admission, and current functional performance, this patient requires continued PT services in home with 24/7 assistance (vs IPR if her progress is slow post procedure.) in order to achieve significant functional improvements in these deficit areas: aerobic capacity/endurance, gait, locomotion, and balance, muscle performance.      Plan:   Continue to follow patient according to established plan of care.  The risks/benefits of therapy have been discussed with the patient/caregiver and he/she is in agreement with the established plan of care.     Subjective & Objective:        06/03/21 0801   Therapist Pager   PT Assigned/ Pager # 14/01/22 [phone]   Rehab Session   Document Type therapy progress note (daily note)   Total PT Minutes: 15   Patient Effort adequate   Symptoms Noted During/After Treatment increased pain   Symptoms Noted Comment Pt experienced excrutiating pain in R side today, limiting  ability to perform. Pain meds were given early this morning per pt, but did not seem to have much of an effect. Pain not rated.   General Information   Patient Profile Reviewed yes   Medical Lines Chest Tube;Foley Catheter;PIV Line;Telemetry   Respiratory Status nasal cannula   Existing Precautions/Restrictions fall precautions;full code   Mutuality/Individual Preferences   Individualized Care Needs OOB w/ HHA, Ax2   Pre Treatment Status   Pre Treatment Patient Status Patient supine in bed;Call light within reach;Telephone within reach;Sitter select activated;Nurse approved session   Support Present Pre Treatment  None   Communication Pre Treatment  Nurse   Communication Pre Treatment Comment RN approved session.   Cognitive Assessment/Interventions   Behavior/Mood Observations behavior appropriate to situation, WNL/WFL   Orientation Status oriented x 4   Attention WNL/WFL   Follows Commands WNL   Vital Signs   O2 Delivery Pre Treatment supplemental O2   O2 Delivery Post Treatment supplemental O2   Vitals Comment VSS   Bed Mobility Assessment/Treatment   Bed Mobility, Assistive Device Head of Bed Elevated   Supine-Sit Independence minimum assist (75% patient effort)   Sit to Supine, Independence not tested   Safety Issues decreased use of arms for pushing/pulling;decreased use of legs for bridging/pushing;impaired trunk control for bed mobility   Impairments balance impaired;coordination impaired;endurance;pain;postural control impaired;ROM decreased;strength decreased   Comment Sup>Sit, MinA   Transfer Assessment/Treatment   Sit-Stand Independence moderate assist (50% patient effort)  Stand-Sit Independence moderate assist (50% patient effort)   Sit-Stand-Sit, Assist Device handheld assist   Bed-Chair Independence moderate assist (50% patient effort)   Chair-Bed Independence not tested   Bed-Chair-Bed Assist Device handheld assist   Toilet Transfer Independence not tested   Transfer Safety Issues balance decreased  during turns;sequencing ability decreased;step length decreased;weight-shifting ability decreased   Transfer Impairments balance impaired;endurance;coordination impaired;pain;postural control impaired;ROM decreased;strength decreased   Transfer Comment All transfers, HHA, ModA required d/t increased pain and weakness, pain not rated.   Gait Assessment/Treatment   Total Distance Ambulated 4   Independence  moderate assist (50% patient effort)   Assistive Device  hand-held assistance   Distance in Feet 70ft   Gait Speed very slow   Deviations  cadence decreased;double stance time increased;swing-to-stance ratio decreased;toe-to-floor clearance decreased;weight-shifting ability decreased;step length decreased   Safety Issues  balance decreased during turns;sequencing ability decreased;step length decreased;weight-shifting ability decreased   Impairments  balance impaired;coordination impaired;endurance;pain;postural control impaired;ROM decreased;strength decreased   Comment Gait, HHA, ModA required for 32ft. Pt limited by excrutiating pain in upper R side, not rated.   Balance Skill Training   Sitting Balance: Static fair balance   Sitting, Dynamic (Balance) fair - balance   Sit-to-Stand Balance poor + balance   Standing Balance: Static fair - balance   Standing Balance: Dynamic poor + balance   Systems Impairment Contributing to Balance Disturbance musculoskeletal   Identified Impairments Contributing to Balance Disturbance impaired coordination;pain;impaired postural control;decreased ROM;decreased strength   Post Treatment Status   Post Treatment Patient Status Patient sitting in bedside chair or w/c;Call light within reach;Telephone within reach;Media planner Post Treatment Comment Pt performance.   Basic Mobility Am-PAC/6Clicks Score (APPROVED PT Staff, [facility] PT/OT, [facility] ONLY, [facility], [facility], and [facility])   Turning in  bed without bedrails 4   Lying on back to sitting on edge of flat bed 3   Moving to and from a bed to a chair 2   Standing up from chair 2   Walk in room 2   Climbing 3-5 steps with railing 2   6 Clicks Raw Score total 15   Standardized (t-scale) score 36.97   CMS 0-100% Score 50.4   CMS Modifier CK   Patient Mobility Goal (JHHLM) 6- Walk 10 steps or more 2X/day   Exercise/Activity Level Performed 6- Walked 10 steps or more  (19ft)   Physical Therapy Clinical Impression   Assessment Pt performed adequately. Sup>Sit, MinA, HOB elevated. Pt highly limited d/t excrutiating pain in R upper trunk area, not rated. Gait, HHA, ModA required for 21ft from bed to chair. Pts deficits cont to be decreased activity tolerance, weakness, fatigue, decreased balance, and extreme limiting pain. If pts progress does not begin to improve, pts rec's will change, but for now cont to anticipate improved recovery and home w/ 24/7A and HHPT upon d/c of stay.   Anticipated Equipment Needs at Discharge (PT) none anticipated   Anticipated Discharge Disposition home with 24/7 assistance  (vs IPR if her progress is slow post procedure.)       Therapist:   Rosilyn Mings, PTA   Pager #: [phone]

## 2021-06-03 NOTE — Progress Notes (Signed)
[facility]  Medicine Interval/Cross Coverage Note    Misty Johnson  Date of service: 06/03/2021    Interval/Cross coverage update  Informed of changes in patient's lung sounds.  I went to bedside to assess.  Patient states she was coughing up some thick mucous earlier.  She has diffuse wheezing audible in all lung fields currently despite albuterol treatment 30 minutes prior.  She is currently with normal oxygen saturations.    - Additional 1x dose of prednisone 40 mg ordered for now.    - Repeat CXR pending.      Jonah Lauro Franklin, MD

## 2021-06-03 NOTE — Nurses Notes (Signed)
Medications administered per order. Assessment made, patient alert and oriented x4. No complaints of N/V reported at this time. Incisional pain reported as 7/10, PRN medication given. Plan of care discussed with patient and patient expressed understanding. Patient left with call bell and personal items within reach and bed in low position. Will continue to monitor.

## 2021-06-03 NOTE — Progress Notes (Signed)
[facility]  Thoracic Surgery   Progress Note      Misty Johnson, Misty Johnson  Date of Admission:  05/28/2021  Date of Birth:  10-28-1950    Hospital Day:  LOS: 6 days   Date of Service:  06/03/2021    Subjective   No acute events overnight. Breathing stable. Resting comfortably in bed in no acute distress. Chest tube drained 100cc.    Objective      Vital Signs:  Temp (24hrs) Max:36.9 C (98.4 F)      Temperature: 36.5 C (97.7 F)  BP (Non-Invasive): 113/82  MAP (Non-Invasive): 89 mmHG  Heart Rate: 54  Respiratory Rate: 18  SpO2: 97 %    Base (Admission) Weight:  Weight: 76.2 kg (167 lb 15.9 oz)  Weight:  Weight: 76.2 kg (167 lb 15.9 oz)      Current Medications:  acetaminophen (TYLENOL) tablet, 650 mg, Oral, Q4H PRN  albuterol (PROVENTIL) 2.5 mg / 3 mL (0.083%) neb solution, 2.5 mg, Nebulization, Q4H PRN  [Held by provider] aspirin chewable tablet 81 mg, 81 mg, Oral, Daily  atorvastatin (LIPITOR) tablet, 40 mg, Oral, QPM  budesonide-formoterol (SYMBICORT) 160 mcg-4.5 mcg per inhalation oral inhaler - "Nursing to administer", 2 Puff, Inhalation, 2x/day  calcium carbonate (TUMS) 500mg  (200mg  elemental calcium) chewable tablet, 500 mg, Oral, 3x/day PRN  D5W 250 mL flush bag, , Intravenous, Q15 Min PRN  D5W 250 mL flush bag, , Intravenous, Q15 Min PRN  [Held by provider] enoxaparin PF (LOVENOX) 40 mg/0.4 mL SubQ injection, 40 mg, Subcutaneous, Daily  escitalopram (LEXAPRO) tablet, 20 mg, Oral, Daily  guaiFENesin 100mg  per 55mL oral liquid - for cough (expectorant), 200 mg, Oral, Q4H PRN  HYDROmorphone (DILAUDID) 1 mg/mL injection, 0.5 mg, Intravenous, Q3H PRN  ketorolac (TORADOL) 30 mg/mL injection, 15 mg, Intravenous, Q6H PRN  ketorolac (TORADOL) 30 mg/mL injection, 15 mg, Intravenous, Q6HRS  loratadine (CLARITIN) tablet, 10 mg, Oral, Daily  NS 250 mL flush bag, , Intravenous, Q15 Min PRN  NS 250 mL flush bag, , Intravenous, Q15 Min PRN  NS flush syringe, 2-6 mL, Intracatheter, Q8HRS  NS flush syringe, 2-6 mL,  Intracatheter, Q1 MIN PRN  NS flush syringe, 2-6 mL, Intracatheter, Q8HRS  NS flush syringe, 2-6 mL, Intracatheter, Q1 MIN PRN  nystatin (MYCOSTATIN) 100,000 units per mL oral liquid, 5 mL, Swish & Swallow, 4x/day  ondansetron (ZOFRAN) 2 mg/mL injection, 4 mg, Intravenous, Q6H PRN  oxyCODONE (ROXICODONE) immediate release tablet, 5 mg, Oral, Q4H PRN   Or  oxyCODONE (ROXICODONE) immediate release tablet, 10 mg, Oral, Q4H PRN  pantoprazole (PROTONIX) delayed release tablet, 20 mg, Oral, Daily before Breakfast  [START ON 06/04/2021] predniSONE (DELTASONE) tablet, 40 mg, Oral, Daily with Breakfast   Followed by  ON 06/07/2021] predniSONE (DELTASONE) tablet 30 mg, 30 mg, Oral, Daily with Breakfast   Followed by  14/08/2020 ON 06/10/2021] predniSONE (DELTASONE) tablet, 20 mg, Oral, Daily with Breakfast   Followed by  14/11/2020 ON 06/13/2021] predniSONE (DELTASONE) tablet, 10 mg, Oral, Daily with Breakfast      Appropriate Home Meds restarted:   Per primary team    I/O:  I/O last 24 hours to current time:      Intake/Output Summary (Last 24 hours) at 06/03/2021 1021  Last data filed at 06/03/2021 0735  Gross per 24 hour   Intake 295 ml   Output 549 ml   Net -254 ml     I/O last 3 completed shifts:  12/01 0700 - 12/01 1859  In: 50 [P.O.:50]  Out: 70 [Urine:50; Chest Tube:20]      In: 323 [P.O.:320; I.V.:3]  Out: 554 [Urine:450; Chest Tube:104]    Nutrition/Diet:  MNT PROTOCOL FOR DIETITIAN  DIETARY ORAL SUPPLEMENTS Oral Supplements with tray: Magic Cup-Orange; LUNCH/DINNER; 1 Each  ROOM SERVICE:  NEEDS VISIT FOR MENU CHOICES  DIET REGULAR Calorie amount: CC 2200    Labs:  Reviewed:  I have reviewed all lab results.  Lab Results Today:    CBC with Diff (Last 24 Hours):    Recent Results last 24 hours     06/03/21  0652   WBC 9.2   HGB 10.5*   HCT 34.8   MCV 86.1   PLTCNT 137*   PMNS 79   LYMPHO 13   MONOCYTES 7   EOSINO 0   BASOPHILS 0  <0.10         Radiology:    Reviewed:   No new imaging    Physical Exam:  Constitutional:  no  distress and vital signs reviewed  Eyes:  Conjunctiva clear.  ENT:  Mouth mucous membranes moist.   Neck:  supple, symmetrical, trachea midline  Respiratory:  No respiratory distress. 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 Primary Problem: Mediastinal mass       PT/OT:  Per primary team    Disposition Planning:  Per primary team      PLAN:  -MG labs pending  -D/c chest tube   -Rest of care per primary team  -Thank you for this consult. We will sign off at this time   -Will place outpatient follow up     I independently of the faculty provider spent a total of (10) minutes in direct/indirect care of this patient including initial evaluation, review of laboratory, radiology, diagnostic studies, review of medical record, order entry and coordination of care.  Jenel Lucks, PA-C  06/03/2021, 10:21        I both saw and examined the patient today in concert with Jenel Lucks PA-C and the resident team. Any pertinent interval imaging studies and labs were personally reviewed. See her note for details.  I agree with the assessment and plan as outlined. Any exceptions or additions are edited/noted.    Valarie Merino, MD

## 2021-06-03 NOTE — Progress Notes (Signed)
[facility]  Medicine Progress Note    [name]  Date of service: 06/03/2021  Date of Admission:  05/28/2021    Hospital Day:  LOS: 6 days       Assessment/ Plan:   Active Hospital Problems    Diagnosis   . Primary Problem: Mediastinal mass       Misty Johnson a 70 y.o.,[place] medical history of CAD, HFrEF, COPD and chronic hypoxic respiratory failure on 3L NC at home who presented to [facility] due to having SOB and hoarseness for the last few daysfound to have COPD exacerbation and mediastinal mass.    Chronic hypoxic respiratory failure 2/2 COPD - initially in acute COPD exacerbation, slight worsening overnight  - Duonebs schedule with albuterol PRN  - Pulmonary toilet  - Symbicort BID  - completed Prednisone 40 mg daily for 5 days. Will restart with taper as concerned patient did not tolerate sudden cessation of steroids  - completed Doxycycline 40 mg daily for 5 days (end date 11/29)  - Continue with 3L NC with goal SpO2 88%+    Mediastinal mass and hoarseness  - thoracic surgery following,s/p R VATS robotic assisted anterior mediastinal mass resection, follow up path  - CT scan images are uploaded on [employer]  - LDH 229, HCG 10  - myasthenia panel pending although low suspicion  - PRN PO and IV narcotics    Thrush - nystatin swish and swallow x 14 days    Acute urinary retention  - urine culture with candida glabarata  - bladder scan TID   - foley present from surgery    CAD  HFrEF  - Continue Lipitor  - restart ASA, OK per thoracics  - TTE with EF of 65%. Technically difficult study due to limited acoustic windows.     Mood disorder  - Continue lexapro    GERD  - continue PPI    Body mass index is 28.84 kg/m.    Hardware (lines, foley's, tubes):  Patient Lines/Drains/Airways Status     Active Line / Dialysis Catheter / Dialysis Graft / Drain / Airway / Wound     Name Placement date Placement time Site Days    Peripheral IV Ultrasound guided Left Forearm  06/01/21  1030  -- 2    Peripheral IV Distal;Right Basilic  (medial side of arm) 06/01/21  1200  -- 2    Foley Catheter 05/30/21  2235  -- 3    Surgical Incision Right;Anterior;Lateral Chest 06/01/21  1115  -- 2                 DVT/PE Prophylaxis: holding    Disposition Planning: IPR vs home: patient wanting home but family states they would not be able to care for her 24/7. Will need to discuss further.     [name], MD  [facility]  Assistant Professor of Medicine  Pager [phone]    ____________________________________________________________________________________________________________________________________________________________________    Chief Complaint: mediastinal mass  Subjective: overnight, had some increased wheezing and restarted on prednisone. Sitting in chair. States breathing is improved from overnight. Denied cp, sob, nausea, vomiting, abd pain.     Called daughter and updated via phone.     Vital Signs:  Temp  Avg: 36.6 C (97.9 F)  Min: 36.4 C (97.5 F)  Max: 36.9 C (98.4 F)    Pulse  Avg: 59.9  Min: 51  Max: 73 BP  Min: 105/76  Max: 153/47   Resp  Avg: 16.7  Min: 16  Max: 18 SpO2  Avg: 97.3 %  Min: 94 %  Max: 100 %          Input/Output    Intake/Output Summary (Last 24 hours) at 06/03/2021 1528  Last data filed at 06/03/2021 1527  Gross per 24 hour   Intake 525 ml   Output 445 ml   Net 80 ml    I/O last shift:  12/01 0700 - 12/01 1859  In: 400 [P.O.:400]  Out: 170 [Urine:150; Chest Tube:20]   acetaminophen (TYLENOL) tablet, 650 mg, Oral, Q4H PRN  albuterol (PROVENTIL) 2.5 mg / 3 mL (0.083%) neb solution, 2.5 mg, Nebulization, Q4H PRN  aspirin chewable tablet 81 mg, 81 mg, Oral, Daily  atorvastatin (LIPITOR) tablet, 40 mg, Oral, QPM  budesonide-formoterol (SYMBICORT) 160 mcg-4.5 mcg per inhalation oral inhaler - "Nursing to administer", 2 Puff, Inhalation, 2x/day  calcium carbonate (TUMS) 500mg  (200mg  elemental calcium) chewable tablet, 500 mg, Oral, 3x/day PRN  D5W 250 mL flush  bag, , Intravenous, Q15 Min PRN  D5W 250 mL flush bag, , Intravenous, Q15 Min PRN  enoxaparin PF (LOVENOX) 40 mg/0.4 mL SubQ injection, 40 mg, Subcutaneous, Daily  escitalopram (LEXAPRO) tablet, 20 mg, Oral, Daily  guaiFENesin 100mg  per 81mL oral liquid - for cough (expectorant), 200 mg, Oral, Q4H PRN  HYDROmorphone (DILAUDID) 1 mg/mL injection, 0.5 mg, Intravenous, Q3H PRN  ketorolac (TORADOL) 30 mg/mL injection, 15 mg, Intravenous, Q6H PRN  ketorolac (TORADOL) 30 mg/mL injection, 15 mg, Intravenous, Q6HRS  loratadine (CLARITIN) tablet, 10 mg, Oral, Daily  NS 250 mL flush bag, , Intravenous, Q15 Min PRN  NS 250 mL flush bag, , Intravenous, Q15 Min PRN  NS flush syringe, 2-6 mL, Intracatheter, Q8HRS  NS flush syringe, 2-6 mL, Intracatheter, Q1 MIN PRN  NS flush syringe, 2-6 mL, Intracatheter, Q8HRS  NS flush syringe, 2-6 mL, Intracatheter, Q1 MIN PRN  nystatin (MYCOSTATIN) 100,000 units per mL oral liquid, 5 mL, Swish & Swallow, 4x/day  ondansetron (ZOFRAN) 2 mg/mL injection, 4 mg, Intravenous, Q6H PRN  oxyCODONE (ROXICODONE) immediate release tablet, 5 mg, Oral, Q4H PRN   Or  oxyCODONE (ROXICODONE) immediate release tablet, 10 mg, Oral, Q4H PRN  pantoprazole (PROTONIX) delayed release tablet, 20 mg, Oral, Daily before Breakfast  [START ON 06/04/2021] predniSONE (DELTASONE) tablet, 40 mg, Oral, Daily with Breakfast   Followed by  Derrill Memo ON 06/07/2021] predniSONE (DELTASONE) tablet 30 mg, 30 mg, Oral, Daily with Breakfast   Followed by  Derrill Memo ON 06/10/2021] predniSONE (DELTASONE) tablet, 20 mg, Oral, Daily with Breakfast   Followed by  Derrill Memo ON 06/13/2021] predniSONE (DELTASONE) tablet, 10 mg, Oral, Daily with Breakfast          Physical Exam  BP 123/82   Pulse 73   Temp 36.7 C (98.1 F)   Resp 16   Ht 1.626 m (5\' 4" )   Wt 76.2 kg (167 lb 15.9 oz)   SpO2 98%   BMI 28.84 kg/m     Constitutional: Alert and oriented x 3, NAD, appears age appropriate  Eyes: Conjunctiva clear, Sclera non-icteric.   ENT: ENMT  without erythema or injection, mucous membranes moist.  Neck: supple, symmetrical, trachea midline  Respiratory: CTAB No wheeze or rales.   Cardiovascular: RRR S1, S2 normal, no murmur, click, rub or gallop  Gastrointestinal: Soft, non-tender, Bowel sounds normal, non-distended  Genitourinary: Deferred  Musculoskeletal: Head atraumatic and normocephalic. R sided chest tube. No significant edema BLE  Integumentary:  Skin warm and dry, No rashes and No  lesions  Neurologic: normal strength and tone. No tremor  Lymphatic/Immunologic/Hematologic: No lymphadenopathy  Psychiatric: Normal affect, behavior    Labs:    CBC  Diff   Lab Results   Component Value Date/Time    WBC 9.2 06/03/2021 06:52 AM    HGB 10.5 (L) 06/03/2021 06:52 AM    HCT 34.8 06/03/2021 06:52 AM    PLTCNT 137 (L) 06/03/2021 06:52 AM    RBC 4.04 06/03/2021 06:52 AM    MCV 86.1 06/03/2021 06:52 AM    MCHC 30.2 (L) 06/03/2021 06:52 AM    MCH 26.0 06/03/2021 06:52 AM    MPV 12.3 06/02/2021 09:06 AM    Lab Results   Component Value Date/Time    PMNS 79 06/03/2021 06:52 AM    MONOCYTES 7 06/03/2021 06:52 AM    BASOPHILS 0 06/03/2021 06:52 AM    BASOPHILS <0.10 06/03/2021 06:52 AM    PMNABS 7.23 06/03/2021 06:52 AM    LYMPHSABS 1.17 06/03/2021 06:52 AM    EOSABS <0.10 06/03/2021 06:52 AM    MONOSABS 0.64 06/03/2021 06:52 AM          Comprehensive Metabolic Profile    Lab Results   Component Value Date/Time    SODIUM 140 06/03/2021 06:52 AM    POTASSIUM 3.9 06/03/2021 06:52 AM    CHLORIDE 100 06/03/2021 06:52 AM    CO2 36 (H) 06/03/2021 06:52 AM    ANIONGAP 4 06/03/2021 06:52 AM    BUN 30 (H) 06/03/2021 06:52 AM    CREATININE 0.72 06/03/2021 06:52 AM    GLUCOSE Negative 05/29/2021 07:53 PM    Lab Results   Component Value Date/Time    CALCIUM 8.4 (L) 06/03/2021 06:52 AM    PHOSPHORUS 2.7 06/03/2021 06:52 AM    ALBUMIN 3.4 05/28/2021 07:10 AM    TOTALPROTEIN 6.3 05/28/2021 07:10 AM    ALKPHOS 73 05/28/2021 07:10 AM    AST 20 05/28/2021 07:10 AM    ALT 18  05/28/2021 07:10 AM    TOTBILIRUBIN 0.9 05/28/2021 07:10 AM        CARDIAC MARKERS  Lab Results   Component Value Date    TROPONINI 18 05/28/2021            Radiology:   Last CXR Impression   XR AP MOBILE CHEST (Exam End: 06/03/2021  5:20 AM)    Impression    Interval repositioning of large bore right-sided chest tube with unchanged pleural effusions. Possible trace right apical pneumothorax   XR CHEST AP AND LATERAL (Exam End: 05/28/2021  8:51 AM)    Impression    Mild pulmonary edema pattern.       Results for orders placed during the hospital encounter of 05/28/21    TRANSTHORACIC ECHOCARDIOGRAM - ADULT 05/28/2021 12:14 PM    Narrative  **See full report in linked PDF document**    Transthoracic Echocardiographic Report    ______________________________________________________________________________  Name: Misty Johnson, Misty Johnson                         MRN: [id number]               Weight: 168 lb  Study Date: 05/28/2021 08:14 AM              DOB: 1950/12/09             Height: 64 in  Gender: Female  Age: 58 yrs                 BSA: 1.8 m2  Accession #: [id number]                    BP: 138/82 mmHg  Patient Location: [facility] [place]  Ordering Provider: Kathrine Cords  TechFN:[id number]    ______________________________________________________________________________  Procedure:  Transthoracic complete echo with contrast, 2D, spectral and tissue Doppler, color flow Doppler, M-mode.    Quality:  Technically difficult study due to limited acoustic windows.    Indications: Dyspnea,Respiratory failure (CMS HCC),Heart failure (CMS HCC),CAD (coronary artery disease)    Conclusions:  Left Ventricle: Normal left ventricular size. The left ventricular ejection fraction by visual assessment is estimated to be 65%. Regional wall motion abnormalities cannot be excluded due to limited  visualization. Left ventricular diastolic function is indeterminate.  Right Ventricle: Normal right ventricular size. Normal right  ventricular systolic function.  Left Atrium: The left atrium is normal in size.  Right Atrium: The right atrium is of normal size.    Findings  Left Ventricle:   Normal left ventricular size. The left ventricular ejection fraction by visual assessment is estimated to be 65%. Left ventricular systolic function is normal. Regional wall motion  abnormalities cannot be excluded due to limited visualization. Left ventricular diastolic function is indeterminate.  Right Ventricle:   Normal right ventricular size. Normal right ventricular systolic function. Right ventricular systolic pressure is indeterminate.  Left Atrium:   The left atrium is normal in size.  Right Atrium:   The right atrium is of normal size.  Mitral Valve:   The mitral valve is not well visualized. No significant mitral regurgitation present.  Tricuspid Valve:   The tricuspid valve is not well visualized. No significant tricuspid regurgitation present.  Aortic Valve:   The aortic valve is not well visualized. No Aortic valve stenosis. No significant aortic regurgitation present.  Pulmonic Valve:   The pulmonic valve is not well visualized.  Pulmonary Artery:   The pulmonary artery is not well visualized.  Atrial Septum:   The interatrial septum is not well visualized.  IVC/Hepatic Veins:   The inferior vena cava was not visualized.  Aorta:   The aortic root is of normal size.  Pericardium/Pleural space:   Normal pericardium with no pericardial effusion.    Electronically signed by: MD Sammuel Cooper on 05/28/2021 12:14 PM

## 2021-06-03 NOTE — Care Plan (Signed)
[facility]  [facility]  Medical Nutrition Therapy Follow Up    SUBJECTIVE: Saw patient while she was sitting up right in chair beside bed. She has no complaints of nausea, vomiting, or diarrhea, but feels like she has been "gagging" a lot the past few days. Has thrush per notes. She mentions her appetite has not been good the past few days and she says she is only eating about 1 meal/day. Patient had yogurt this morning for breakfast. Patient does not like the magic cup very much, she says she just "picks at it". Suggested Gelatin 20 and patient would like to try that instead. She had no further questions or concerns.     OBJECTIVE:     Current Diet Order/Nutrition Support:  MNT PROTOCOL FOR DIETITIAN  DIETARY ORAL SUPPLEMENTS Oral Supplements with tray: Magic Cup-Orange; LUNCH/DINNER; 1 Each  ROOM SERVICE:  NEEDS VISIT FOR MENU CHOICES  DIET REGULAR Calorie amount: CC 2200     Height Used for Calculations: 162.6 cm (5' 4.02")  Weight Used For Calculations: 76.2 kg (167 lb 15.9 oz)  BMI (kg/m2): 28.88     Ideal Body Weight (IBW) (kg): 55.04  % Ideal Body Weight: 138.44  Adjusted/Standard Body Weight  Adjusted BW: 60.33 kg  Usual Body Weight: 64.4 kg (142 lb) (per pt weight fluctuates up and down)  Usual Body Weight: 64.4 kg (142 lb) (per pt weight fluctuates up and down)       Re-assessed needs if applicable:    Energy Calorie Requirements: 1,800 kcal- 2,000 kcal per day ( Adj. BW 60.33 kcals/ 30-33 kg Adj BW)  Protein Requirements (gms/day): 55 g- 72 g per day (1-1.3 g/ IBW 55 kg IBW)  Fluid Requirements: 1,500 mL- 1,800 mL per day (25-30 mLs/ Adj. BW 60.33 kg Adj BW)    Comments: Patient is a 70 y/o female with a mediastinal mass and a PMH of CAD, HFrEF, COPD, and chronic hypoxic respiratory failure. Path pending for mediastinal mass.    Plan/Interventions :   -continue regular diet, possibly downgrade to mechanical soft diet if swallowing difficulty worsens   -discontinue magic cup, patient does not  like it   -recommend Gelatin 20 (fruit punch) BID to increase protein intake   -encourage small, frequent meals to stimulate appetite  -encourage bland foods like mashed potatoes, applesauce, banana, oatmeal, or yogurt to help with nausea  -encourage soft foods for sore throat   -monitor weight 2x/week     Nutrition Diagnosis: Inadequate protein-energy intake related to sore throat and poor appetite as evidenced by poor PO intake of 0%-25% over the last few days. (NEW)      Winn Jock, INTERN DIETETIC  06/03/2021, 10:39    I reviewed the intern's note and agree with the findings and recommendations documented. Any exceptions/additions are edited/noted.    Maryan Rued, RDLD  06/03/2021, 11:37  Pager [phone]

## 2021-06-04 ENCOUNTER — Inpatient Hospital Stay (HOSPITAL_COMMUNITY): Payer: Medicare Other

## 2021-06-04 DIAGNOSIS — F329 Major depressive disorder, single episode, unspecified: Secondary | ICD-10-CM

## 2021-06-04 DIAGNOSIS — Z9889 Other specified postprocedural states: Secondary | ICD-10-CM

## 2021-06-04 DIAGNOSIS — J90 Pleural effusion, not elsewhere classified: Secondary | ICD-10-CM

## 2021-06-04 DIAGNOSIS — I509 Heart failure, unspecified: Secondary | ICD-10-CM

## 2021-06-04 LAB — CBC WITH DIFF
BASOPHIL #: <0.1 10*3/uL (ref <=0.20)
BASOPHIL %: 0 %
EOSINOPHIL #: <0.1 10*3/uL (ref <=0.50)
EOSINOPHIL %: 0 %
HCT: 32.9 % — ABNORMAL LOW (ref 34.8–46.0)
HGB: 9.7 g/dL — ABNORMAL LOW (ref 11.5–16.0)
IMMATURE GRANULOCYTE #: <0.1 10*3/uL (ref <0.10)
IMMATURE GRANULOCYTE %: 1 % (ref 0–1)
LYMPHOCYTE #: 0.89 10*3/uL — ABNORMAL LOW (ref 1.00–4.80)
LYMPHOCYTE %: 11 %
MCH: 25.4 pg — ABNORMAL LOW (ref 26.0–32.0)
MCHC: 29.5 g/dL — ABNORMAL LOW (ref 31.0–35.5)
MCV: 86.1 fL (ref 78.0–100.0)
MONOCYTE #: 0.78 10*3/uL (ref 0.20–1.10)
MONOCYTE %: 10 %
NEUTROPHIL #: 6.35 10*3/uL (ref 1.50–7.70)
NEUTROPHIL %: 78 %
PLATELETS: 131 10*3/uL — ABNORMAL LOW (ref 150–400)
RBC: 3.82 10*6/uL — ABNORMAL LOW (ref 3.85–5.22)
RDW-CV: 16.2 % — ABNORMAL HIGH (ref 11.5–15.5)
WBC: 8.2 10*3/uL (ref 3.7–11.0)

## 2021-06-04 MED ORDER — ALBUTEROL SULFATE 2.5 MG/3 ML (0.083 %) SOLUTION FOR NEBULIZATION
INHALATION_SOLUTION | RESPIRATORY_TRACT | Status: AC
Start: 2021-06-04 — End: 2021-06-04
  Filled 2021-06-04: qty 3

## 2021-06-04 MED ORDER — IPRATROPIUM 0.5 MG-ALBUTEROL 3 MG (2.5 MG BASE)/3 ML NEBULIZATION SOLN
3.0000 mL | INHALATION_SOLUTION | Freq: Four times a day (QID) | RESPIRATORY_TRACT | Status: DC
Start: 2021-06-04 — End: 2021-06-08
  Administered 2021-06-04 – 2021-06-05 (×3): 3 mL via RESPIRATORY_TRACT
  Administered 2021-06-05: 22:00:00 0 mL via RESPIRATORY_TRACT
  Administered 2021-06-05 – 2021-06-07 (×10): 3 mL via RESPIRATORY_TRACT
  Administered 2021-06-08: 12:00:00 0 mL via RESPIRATORY_TRACT
  Administered 2021-06-08: 08:00:00 3 mL via RESPIRATORY_TRACT

## 2021-06-04 MED ORDER — IPRATROPIUM 0.5 MG-ALBUTEROL 3 MG (2.5 MG BASE)/3 ML NEBULIZATION SOLN
INHALATION_SOLUTION | RESPIRATORY_TRACT | Status: AC
Start: 2021-06-04 — End: 2021-06-04
  Filled 2021-06-04: qty 12

## 2021-06-04 NOTE — Nurses Notes (Signed)
1535- Pt resting quietly . Respirations easy and non-labored. Call light within reach.

## 2021-06-04 NOTE — Nurses Notes (Signed)
Patient exhibiting significant exp wheeze, paged resp to come administer prn albuterol neb. Paged service for more consistent breathing tx. Will order aggressive pulm toilet.     Deborah Chalk, RN  06/04/2021, 11:37

## 2021-06-04 NOTE — Progress Notes (Signed)
[facility]  Medicine Progress Note    Shanteria Laye  Date of service: 06/04/2021  Date of Admission:  05/28/2021    Hospital Day:  LOS: 7 days       Assessment/ Plan:   Active Hospital Problems    Diagnosis   . Primary Problem: Mediastinal mass       Misty Johnson a 70 y.o.,[place] medical history of CAD, HFrEF, COPD and chronic hypoxic respiratory failure on 3L NC at home who presented to [facility] due to having SOB and hoarseness for the last few daysfound to have COPD exacerbation and mediastinal mass.    Chronic hypoxic respiratory failure 2/2 COPD - initially in acute COPD exacerbation, slight worsening overnight  - Duonebs schedule with albuterol PRN  - Pulmonary toilet  - Symbicort BID  - completed Prednisone 40 mg daily for 5 days. Continue taper as concerned patient did not tolerate sudden cessation of steroids  - completed Doxycycline 40 mg daily for 5 days (end date 11/29)  - Continue with 3L NC with goal SpO2 88%+    Mediastinal mass and hoarseness  - thoracic surgery following,s/p R VATS robotic assisted anterior mediastinal mass resection, follow up path  - CT scan images are uploaded on [employer]  - LDH 229, HCG 10  - myasthenia panel pending although low suspicion  - PRN PO and IV narcotics    Thrush - nystatin swish and swallow x 14 days    Acute urinary retention  - urine culture with candida glabarata  - bladder scan TID   - foley present from surgery. Will need to try and remove    CAD  HFrEF  - Continue Lipitor  - restart ASA, OK per thoracics  - TTE with EF of 65%. Technically difficult study due to limited acoustic windows.     Mood disorder  - Continue lexapro    GERD  - continue PPI    Body mass index is 28.84 kg/m.    Hardware (lines, foley's, tubes):  Patient Lines/Drains/Airways Status     Active Line / Dialysis Catheter / Dialysis Graft / Drain / Airway / Wound     Name Placement date Placement time Site Days    Peripheral IV Ultrasound  guided Left Forearm 06/01/21  1030  -- 3    Peripheral IV Distal;Right Basilic  (medial side of arm) 06/01/21  1200  -- 3    Foley Catheter 05/30/21  2235  -- 4    Surgical Incision Right;Anterior;Lateral Chest 06/01/21  1115  -- 3                 DVT/PE Prophylaxis: lovenox    Disposition Planning: IPR - patient now agreeable    Murtis Sink, MD  [place]  Assistant Professor of Medicine  Pager [phone]    ____________________________________________________________________________________________________________________________________________________________________    Chief Complaint: mediastinal mass  Subjective: NAEO. Resting in bed. Reports some mild SOB but no desaturations. States she sometimes gets wheezy in the morning at home. Denied cp, nausea, vomiting, abd pain.     Vital Signs:  Temp  Avg: 36.5 C (97.7 F)  Min: 36.3 C (97.3 F)  Max: 36.6 C (97.9 F)    Pulse  Avg: 60  Min: 55  Max: 63 BP  Min: 104/46  Max: 142/60   Resp  Avg: 16.3  Min: 16  Max: 18 SpO2  Avg: 95.2 %  Min: 91 %  Max: 98 %  Input/Output    Intake/Output Summary (Last 24 hours) at 06/04/2021 1642  Last data filed at 06/04/2021 1527  Gross per 24 hour   Intake 580 ml   Output 475 ml   Net 105 ml    I/O last shift:  12/02 0700 - 12/02 1859  In: 240 [P.O.:240]  Out: 275 [Urine:275]   acetaminophen (TYLENOL) tablet, 650 mg, Oral, Q4H PRN  albuterol (PROVENTIL) 2.5 mg / 3 mL (0.083%) neb solution, 2.5 mg, Nebulization, Q4H PRN  aspirin chewable tablet 81 mg, 81 mg, Oral, Daily  atorvastatin (LIPITOR) tablet, 40 mg, Oral, QPM  budesonide-formoterol (SYMBICORT) 160 mcg-4.5 mcg per inhalation oral inhaler - "Nursing to administer", 2 Puff, Inhalation, 2x/day  calcium carbonate (TUMS) 500mg  (200mg  elemental calcium) chewable tablet, 500 mg, Oral, 3x/day PRN  D5W 250 mL flush bag, , Intravenous, Q15 Min PRN  D5W 250 mL flush bag, , Intravenous, Q15 Min PRN  enoxaparin PF (LOVENOX) 40 mg/0.4 mL SubQ injection, 40 mg,  Subcutaneous, Daily  escitalopram (LEXAPRO) tablet, 20 mg, Oral, Daily  guaiFENesin 100mg  per 29mL oral liquid - for cough (expectorant), 200 mg, Oral, Q4H PRN  HYDROmorphone (DILAUDID) 1 mg/mL injection, 0.5 mg, Intravenous, Q3H PRN  ketorolac (TORADOL) 30 mg/mL injection, 15 mg, Intravenous, Q6H PRN  ketorolac (TORADOL) 30 mg/mL injection, 15 mg, Intravenous, Q6HRS  loratadine (CLARITIN) tablet, 10 mg, Oral, Daily  NS 250 mL flush bag, , Intravenous, Q15 Min PRN  NS 250 mL flush bag, , Intravenous, Q15 Min PRN  NS flush syringe, 2-6 mL, Intracatheter, Q8HRS  NS flush syringe, 2-6 mL, Intracatheter, Q1 MIN PRN  NS flush syringe, 2-6 mL, Intracatheter, Q8HRS  NS flush syringe, 2-6 mL, Intracatheter, Q1 MIN PRN  nystatin (MYCOSTATIN) 100,000 units per mL oral liquid, 5 mL, Swish & Swallow, 4x/day  ondansetron (ZOFRAN) 2 mg/mL injection, 4 mg, Intravenous, Q6H PRN  oxyCODONE (ROXICODONE) immediate release tablet, 5 mg, Oral, Q4H PRN   Or  oxyCODONE (ROXICODONE) immediate release tablet, 10 mg, Oral, Q4H PRN  pantoprazole (PROTONIX) delayed release tablet, 20 mg, Oral, Daily before Breakfast  predniSONE (DELTASONE) tablet, 40 mg, Oral, Daily with Breakfast   Followed by  Derrill Memo ON 06/07/2021] predniSONE (DELTASONE) tablet 30 mg, 30 mg, Oral, Daily with Breakfast   Followed by  Derrill Memo ON 06/10/2021] predniSONE (DELTASONE) tablet, 20 mg, Oral, Daily with Breakfast   Followed by  Derrill Memo ON 06/13/2021] predniSONE (DELTASONE) tablet, 10 mg, Oral, Daily with Breakfast          Physical Exam  BP 124/82   Pulse 60   Temp 36.5 C (97.7 F)   Resp 18   Ht 1.626 m (5\' 4" )   Wt 76.2 kg (167 lb 15.9 oz)   SpO2 95%   BMI 28.84 kg/m     Constitutional: AOx 3, NAD, appears age appropriate  Eyes: Conjunctiva clear, Sclera non-icteric.   ENT: ENMT without erythema or injection, mucous membranes moist.  Neck: supple, symmetrical, trachea midline  Respiratory: CTAB with some scattered wheeze, no rales.   Cardiovascular: RRR S1, S2  normal, no murmur, click, rub or gallop  Gastrointestinal: soft, non-tender, Bowel sounds normal, non-distended  Genitourinary: Deferred  Musculoskeletal: Head atraumatic and normocephalic. R sided chest tube. No significant edema BLE  Integumentary:  Skin warm and dry, No rashes and No lesions  Neurologic: normal strength and tone. No tremor  Psychiatric: Normal affect, behavior    Labs:    CBC  Diff   Lab Results   Component Value Date/Time  WBC 8.2 06/04/2021 06:44 AM    HGB 9.7 (L) 06/04/2021 06:44 AM    HCT 32.9 (L) 06/04/2021 06:44 AM    PLTCNT 131 (L) 06/04/2021 06:44 AM    RBC 3.82 (L) 06/04/2021 06:44 AM    MCV 86.1 06/04/2021 06:44 AM    MCHC 29.5 (L) 06/04/2021 06:44 AM    MCH 25.4 (L) 06/04/2021 06:44 AM    MPV 12.3 06/02/2021 09:06 AM    Lab Results   Component Value Date/Time    PMNS 78 06/04/2021 06:44 AM    MONOCYTES 10 06/04/2021 06:44 AM    BASOPHILS 0 06/04/2021 06:44 AM    BASOPHILS <0.10 06/04/2021 06:44 AM    PMNABS 6.35 06/04/2021 06:44 AM    LYMPHSABS 0.89 (L) 06/04/2021 06:44 AM    EOSABS <0.10 06/04/2021 06:44 AM    MONOSABS 0.78 06/04/2021 06:44 AM          Comprehensive Metabolic Profile    Lab Results   Component Value Date/Time    SODIUM 140 06/03/2021 06:52 AM    POTASSIUM 3.9 06/03/2021 06:52 AM    CHLORIDE 100 06/03/2021 06:52 AM    CO2 36 (H) 06/03/2021 06:52 AM    ANIONGAP 4 06/03/2021 06:52 AM    BUN 30 (H) 06/03/2021 06:52 AM    CREATININE 0.72 06/03/2021 06:52 AM    GLUCOSE Negative 05/29/2021 07:53 PM    Lab Results   Component Value Date/Time    CALCIUM 8.4 (L) 06/03/2021 06:52 AM    PHOSPHORUS 2.7 06/03/2021 06:52 AM    ALBUMIN 3.4 05/28/2021 07:10 AM    TOTALPROTEIN 6.3 05/28/2021 07:10 AM    ALKPHOS 73 05/28/2021 07:10 AM    AST 20 05/28/2021 07:10 AM    ALT 18 05/28/2021 07:10 AM    TOTBILIRUBIN 0.9 05/28/2021 07:10 AM        CARDIAC MARKERS  Lab Results   Component Value Date    TROPONINI 18 05/28/2021            Radiology:   Last CXR Impression   XR AP MOBILE CHEST  (Exam End: 06/04/2021  7:00 AM)    Impression    Chest tube removal otherwise unchanged   XR CHEST AP AND LATERAL (Exam End: 05/28/2021  8:51 AM)    Impression    Mild pulmonary edema pattern.       Results for orders placed during the hospital encounter of 05/28/21    TRANSTHORACIC ECHOCARDIOGRAM - ADULT 05/28/2021 12:14 PM    Narrative  **See full report in linked PDF document**    Transthoracic Echocardiographic Report    ______________________________________________________________________________  Name: GABRIANA, BARROS                         MRN: [id number]               Weight: 168 lb  Study Date: 05/28/2021 08:14 AM              DOB: [birth year]             Height: 64 in  Gender: Female                               Age: 11 yrs                 BSA: 1.8 m2  Accession #: [id number]  BP: 138/82 mmHg  Patient Location: [facility] [place]  Ordering Provider: Kathrine Cords  TechFN:[id number]    ______________________________________________________________________________  Procedure:  Transthoracic complete echo with contrast, 2D, spectral and tissue Doppler, color flow Doppler, M-mode.    Quality:  Technically difficult study due to limited acoustic windows.    Indications: Dyspnea,Respiratory failure (CMS HCC),Heart failure (CMS HCC),CAD (coronary artery disease)    Conclusions:  Left Ventricle: Normal left ventricular size. The left ventricular ejection fraction by visual assessment is estimated to be 65%. Regional wall motion abnormalities cannot be excluded due to limited  visualization. Left ventricular diastolic function is indeterminate.  Right Ventricle: Normal right ventricular size. Normal right ventricular systolic function.  Left Atrium: The left atrium is normal in size.  Right Atrium: The right atrium is of normal size.    Findings  Left Ventricle:   Normal left ventricular size. The left ventricular ejection fraction by visual assessment is estimated to be 65%. Left ventricular systolic  function is normal. Regional wall motion  abnormalities cannot be excluded due to limited visualization. Left ventricular diastolic function is indeterminate.  Right Ventricle:   Normal right ventricular size. Normal right ventricular systolic function. Right ventricular systolic pressure is indeterminate.  Left Atrium:   The left atrium is normal in size.  Right Atrium:   The right atrium is of normal size.  Mitral Valve:   The mitral valve is not well visualized. No significant mitral regurgitation present.  Tricuspid Valve:   The tricuspid valve is not well visualized. No significant tricuspid regurgitation present.  Aortic Valve:   The aortic valve is not well visualized. No Aortic valve stenosis. No significant aortic regurgitation present.  Pulmonic Valve:   The pulmonic valve is not well visualized.  Pulmonary Artery:   The pulmonary artery is not well visualized.  Atrial Septum:   The interatrial septum is not well visualized.  IVC/Hepatic Veins:   The inferior vena cava was not visualized.  Aorta:   The aortic root is of normal size.  Pericardium/Pleural space:   Normal pericardium with no pericardial effusion.    Electronically signed by: MD Sammuel Cooper on 05/28/2021 12:14 PM

## 2021-06-04 NOTE — Nurses Notes (Signed)
Patient complaining of mediastinal and pain below R breast, pain treated with 10 mg oxycodone per [facility], with good verbalized relief from the patient. Educated patient on importance of coughing and deep-breathing given patient's post-op status and expiratory wheeze which is audible without the use of a stethoscope.     Deborah Chalk, RN  06/04/2021, 10:27

## 2021-06-04 NOTE — Care Plan (Signed)
[facility]  Rehabilitation Services  Physical Therapy Progress Note      Patient Name: Misty Johnson  Date of Birth: 07-10-1950  Height:  162.6 cm (5\' 4" )  Weight:  76.2 kg (167 lb 15.9 oz)  Room/Bed: 904/A  Payor: MEDICARE / Plan: MEDICARE PART A AND B / Product Type: Medicare /     Assessment:     Pt performed well. Pt tolerated much more activity on this date, and ambulation improved w/ AD. Sup>Sit and transfers, SBA. Pt ambulated 166ft total (9ftx1trial, 198ftx1trial), FWW, 1+1 chair follow, CGA. Pt exhibited increased fatigue, SOB, and slight pain during ambulation. Cont to anticipate home w/ 24/7A upon d/c.    Discharge Needs:   Equipment Recommendation: none anticipated    Discharge Disposition: home with 24/7 assistance (pt may progress to home w/ A depending on her progress.)    JUSTIFICATION OF DISCHARGE RECOMMENDATION   Based on current diagnosis, functional performance prior to admission, and current functional performance, this patient requires continued PT services in home with 24/7 assistance (pt may progress to home w/ A depending on her progress.) in order to achieve significant functional improvements in these deficit areas: aerobic capacity/endurance, gait, locomotion, and balance, muscle performance.      Plan:   Continue to follow patient according to established plan of care.  The risks/benefits of therapy have been discussed with the patient/caregiver and he/she is in agreement with the established plan of care.     Subjective & Objective:        06/04/21 1140   Therapist Pager   PT Assigned/ Pager # [phone]/02/22 [phone]   Rehab Session   Document Type therapy progress note (daily note)   Total PT Minutes: 29   Patient Effort good   Symptoms Noted During/After Treatment fatigue;shortness of breath;increased pain   Symptoms Noted Comment Pt experienced increased fatigue, SOB and pain, not rated.   General Information   Patient Profile Reviewed yes   Medical Lines Foley Catheter;PIV  Line;Telemetry   Respiratory Status nasal cannula   Existing Precautions/Restrictions fall precautions;full code   Mutuality/Individual Preferences   Individualized Care Needs OOB w/ FWW, Ax1   Pre Treatment Status   Pre Treatment Patient Status Patient supine in bed;Call light within reach;Telephone within reach;Sitter select activated;Nurse approved session   Support Present Pre Treatment  None   Communication Pre Treatment  Nurse   Communication Pre Treatment Comment RN approved session.   Cognitive Assessment/Interventions   Behavior/Mood Observations behavior appropriate to situation, WNL/WFL   Orientation Status oriented x 4   Attention WNL/WFL   Follows Commands WNL   Vital Signs   O2 Delivery Pre Treatment supplemental O2   O2 Delivery Post Treatment supplemental O2   Vitals Comment Pt exhibited increased SOB during activity, but was able to manage this during session.   Bed Mobility Assessment/Treatment   Bed Mobility, Assistive Device Head of Bed Elevated   Supine-Sit Independence stand-by assistance   Sit to Supine, Independence not tested   Safety Issues decreased use of arms for pushing/pulling;impaired trunk control for bed mobility;decreased use of legs for bridging/pushing   Impairments balance impaired;coordination impaired;endurance;pain;postural control impaired;ROM decreased;strength decreased   Comment Sup>Sit, SBA.   Transfer Assessment/Treatment   Sit-Stand Independence stand-by assistance   Stand-Sit Independence stand-by assistance   Sit-Stand-Sit, Assist Device walker, front wheeled   Bed-Chair Independence contact guard assist   Chair-Bed Independence not tested   Bed-Chair-Bed Assist Device walker, front wheeled   Toilet Transfer Independence  not tested   Transfer Safety Issues balance decreased during turns;step length decreased;weight-shifting ability decreased   Transfer Impairments balance impaired;coordination impaired;endurance;flexibility decreased;pain;postural control  impaired;ROM decreased;strength decreased   Transfer Comment STS and Stand>Sit, FWW, SBA. Bed>Chair transfer, FWW, CGA.   Gait Assessment/Treatment   Total Distance Ambulated 182   Independence  contact guard assist;1 person + 1 person to manage equipment   Assistive Device  walker, front wheeled   Distance in Feet 167ft total (35ftx1, 1104ftx1)   Gait Speed very slow   Deviations  cadence decreased;step length decreased;weight-shifting ability decreased   Safety Issues  balance decreased during turns;step length decreased;weight-shifting ability decreased   Impairments  balance impaired;coordination impaired;endurance;flexibility decreased;pain;postural control impaired;ROM decreased;strength decreased   Comment Gait, FWW, 1+1 chair follow, CGA required for 128ft total (25ftx1trial, 166ftx1trial). Pt exhibited increased fatigue, SOB, and slight pain during ambulation.   Balance Skill Training   Sitting Balance: Static fair + balance   Sitting, Dynamic (Balance) fair balance   Sit-to-Stand Balance fair + balance   Standing Balance: Static fair balance   Standing Balance: Dynamic fair - balance   Systems Impairment Contributing to Balance Disturbance musculoskeletal   Identified Impairments Contributing to Balance Disturbance impaired coordination;pain;impaired postural control;decreased ROM;decreased strength   Post Treatment Status   Post Treatment Patient Status Patient sitting in bedside chair or w/c;Call light within reach;Telephone within reach;Media planner Post Treatment Comment Pt performance.   Basic Mobility Am-PAC/6Clicks Score (APPROVED PT Staff, [facility] PT/OT, [facility] ONLY, [facility], [facility], and [facility])   Turning in bed without bedrails 4   Lying on back to sitting on edge of flat bed 4   Moving to and from a bed to a chair 3   Standing up from chair 4   Walk in room 3   Climbing 3-5 steps with railing 2   6  Clicks Raw Score total 20   Standardized (t-scale) score 43.99   CMS 0-100% Score 33.32   CMS Modifier CJ   Patient Mobility Goal (JHHLM) 7- Walk 25 feet or more 3X/day   Exercise/Activity Level Performed 7- Walked 25 feet or more   Physical Therapy Clinical Impression   Assessment Pt performed well. Pt tolerated much more activity on this date, and ambulation improved w/ AD. Sup>Sit and transfers, SBA. Pt ambulated 180ft total (25ftx1trial, 169ftx1trial), FWW, 1+1 chair follow, CGA. Pt exhibited increased fatigue, SOB, and slight pain during ambulation. Cont to anticipate home w/ 24/7A upon d/c.   Anticipated Equipment Needs at Discharge (PT) none anticipated   Anticipated Discharge Disposition home with 24/7 assistance  (pt may progress to home w/ A depending on her progress.)       Therapist:   Rosilyn Mings, PTA   Pager #: [phone]

## 2021-06-04 NOTE — Care Management Notes (Signed)
[facility] Management Note    Patient Name: Misty Johnson  Date of Birth: 06-Mar-1951  Sex: female  Date/Time of Admission: 05/28/2021  4:57 AM  Room/Bed: 904/A  Payor: MEDICARE / Plan: MEDICARE PART A AND B / Product Type: Medicare /    LOS: 7 days   Primary Care Providers:  [name], FNP-C, FNP-C (General)    Admitting Diagnosis:  Mediastinal mass [J98.59]    Assessment:      06/04/21 1000   Assessment Details   Assessment Type Continued Assessment   Date of Care Management Update 06/04/21   Date of Next DCP Update 06/07/21   Care Management Plan   Discharge Planning Status plan in progress   Projected Discharge Date 06/08/21   Discharge plan discussed with: Patient   Patient choice offered to patient/family yes   Form for patient choice reviewed/signed and on chart no   Facility or Agency Preferences [facility]   Discharge Needs Assessment   Discharge Facility/Level of Care Needs Acute Rehab Placement/Return (not psych)(code 62)       Discharge Plan:  Acute Rehab Placement/Return (not psych) (code 62)  Per service, pt now agreeable to IPR. MSW met with pt at bedside to discuss d/c planning. Confirmed pt is agreeable to IPR. She wishes for referral to be sent to [facility] so she can be closer to home. MSW sent appropriate referral via [employer] and will follow.    The patient will continue to be evaluated for developing discharge needs.     Case Manager: Vicente Serene, [place]  Phone: [phone]

## 2021-06-05 DIAGNOSIS — R490 Dysphonia: Secondary | ICD-10-CM

## 2021-06-05 LAB — TYPE AND SCREEN
ABO/RH(D): B NEG
ANTIBODY SCREEN: NEGATIVE
UNITS ORDERED: 2

## 2021-06-05 LAB — CROSSMATCH RED CELLS - UNITS
UNIT DIVISION: 0
UNIT DIVISION: 0

## 2021-06-05 NOTE — Nurses Notes (Signed)
Pt still not feeling the urge to void. Assisted pt to bedside so that she could at least try. Pt was unsuccessful. Bladder scan performed x3 and showing  < 150.  Pts bladder is non distended at this time. Fresh ice water given to pt and encouraged to try to drink.

## 2021-06-05 NOTE — Progress Notes (Signed)
[facility]  Medicine Progress Note    Misty Johnson  Date of service: 06/05/2021  Date of Admission:  05/28/2021    Hospital Day:  LOS: 8 days       Assessment/ Plan:   Active Hospital Problems    Diagnosis    Primary Problem: Mediastinal mass       Misty Johnson a 70 y.o.,[place] medical history of CAD, HFrEF, COPD and chronic hypoxic respiratory failure on 3L NC at home who presented to [facility] due to having SOB and hoarseness for the last few daysfound to have COPD exacerbation and mediastinal mass.    Chronic hypoxic respiratory failure 2/2 COPD - initially in acute COPD exacerbation, slight worsening overnight  - Duonebs schedule with albuterol PRN. Patient states he typically uses neb treatments at home multiple times a day  - Pulmonary toilet  - Symbicort BID  - completed Prednisone 40 mg daily for 5 days. Continue taper as patient did not tolerate sudden cessation of steroids  - completed Doxycycline 40 mg daily for 5 days (end date 11/29)  - Continue with 3L NC with goal SpO2 88%+    Mediastinal mass and hoarseness  - thoracic surgery following,s/p R VATS robotic assisted anterior mediastinal mass resection, follow up path  - CT scan images are uploaded on Synapse  - LDH 229, HCG 10  - myasthenia panel pending although low suspicion  - PRN PO and IV narcotics    Thrush - nystatin swish and swallow x 14 days    Acute urinary retention  - urine culture with candida glabarata  - bladder scan TID   - foley present from surgery. Will remove today and monitor for urine retention  - bladder scan and PRN straight cath orders placed    CAD   HFpEF  - Continue Lipitor  - restart ASA, OK per thoracics  - TTE with EF of 65%. Technically difficult study due to limited acoustic windows.     Mood disorder  - Continue lexapro    GERD  - continue PPI    Body mass index is 28.84 kg/m.    Hardware (lines, foley's, tubes):  Patient Lines/Drains/Airways Status     Active  Line / Dialysis Catheter / Dialysis Graft / Drain / Airway / Wound     Name Placement date Placement time Site Days    Peripheral IV Ultrasound guided Left Forearm 06/01/21  1030  -- 4    Peripheral IV Distal;Right Basilic  (medial side of arm) 06/01/21  1200  -- 4    Foley Catheter 05/30/21  2235  -- 5    Surgical Incision Right;Anterior;Lateral Chest 06/01/21  1115  -- 4                 DVT/PE Prophylaxis: lovenox    Disposition Planning: IPR - patient now agreeable, awaiting auth    Murtis Sink, MD  [facility]  Assistant Professor of Medicine  Pager [phone]    ____________________________________________________________________________________________________________________________________________________________________    Chief Complaint: mediastinal mass  Subjective: NAEO. Resting in bed. States breathing is about her baseline currently. States she uses nebs at home several times a day at baseline. Denied cp, sob, nausea, vomiting, abd pain.     Vital Signs:  Temp  Avg: 36.9 C (98.4 F)  Min: 36.7 C (98.1 F)  Max: 37.1 C (98.8 F)    Pulse  Avg: 62.4  Min: 55  Max: 71 BP  Min: 105/68  Max: 126/45  Resp  Avg: 18  Min: 18  Max: 18 SpO2  Avg: 97.3 %  Min: 96 %  Max: 99 %          Input/Output    Intake/Output Summary (Last 24 hours) at 06/05/2021 1527  Last data filed at 06/05/2021 1241  Gross per 24 hour   Intake 655 ml   Output 375 ml   Net 280 ml    I/O last shift:  12/03 0700 - 12/03 1859  In: 400 [P.O.:400]  Out: 150 [Urine:150]   acetaminophen (TYLENOL) tablet, 650 mg, Oral, Q4H PRN  albuterol (PROVENTIL) 2.5 mg / 3 mL (0.083%) neb solution, 2.5 mg, Nebulization, Q4H PRN  aspirin chewable tablet 81 mg, 81 mg, Oral, Daily  atorvastatin (LIPITOR) tablet, 40 mg, Oral, QPM  budesonide-formoterol (SYMBICORT) 160 mcg-4.5 mcg per inhalation oral inhaler - "Nursing to administer", 2 Puff, Inhalation, 2x/day  calcium carbonate (TUMS) 500mg  (200mg  elemental calcium) chewable tablet, 500 mg, Oral, 3x/day  PRN  D5W 250 mL flush bag, , Intravenous, Q15 Min PRN  D5W 250 mL flush bag, , Intravenous, Q15 Min PRN  enoxaparin PF (LOVENOX) 40 mg/0.4 mL SubQ injection, 40 mg, Subcutaneous, Daily  escitalopram (LEXAPRO) tablet, 20 mg, Oral, Daily  guaiFENesin 100mg  per 22mL oral liquid - for cough (expectorant), 200 mg, Oral, Q4H PRN  HYDROmorphone (DILAUDID) 1 mg/mL injection, 0.5 mg, Intravenous, Q3H PRN  ipratropium-albuterol 0.5 mg-3 mg(2.5 mg base)/3 mL Solution for Nebulization, 3 mL, Nebulization, 4x/day  ketorolac (TORADOL) 30 mg/mL injection, 15 mg, Intravenous, Q6HRS  loratadine (CLARITIN) tablet, 10 mg, Oral, Daily  NS 250 mL flush bag, , Intravenous, Q15 Min PRN  NS 250 mL flush bag, , Intravenous, Q15 Min PRN  NS flush syringe, 2-6 mL, Intracatheter, Q8HRS  NS flush syringe, 2-6 mL, Intracatheter, Q1 MIN PRN  NS flush syringe, 2-6 mL, Intracatheter, Q8HRS  NS flush syringe, 2-6 mL, Intracatheter, Q1 MIN PRN  nystatin (MYCOSTATIN) 100,000 units per mL oral liquid, 5 mL, Swish & Swallow, 4x/day  ondansetron (ZOFRAN) 2 mg/mL injection, 4 mg, Intravenous, Q6H PRN  oxyCODONE (ROXICODONE) immediate release tablet, 5 mg, Oral, Q4H PRN   Or  oxyCODONE (ROXICODONE) immediate release tablet, 10 mg, Oral, Q4H PRN  pantoprazole (PROTONIX) delayed release tablet, 20 mg, Oral, Daily before Breakfast  predniSONE (DELTASONE) tablet, 40 mg, Oral, Daily with Breakfast   Followed by  Derrill Memo ON 06/07/2021] predniSONE (DELTASONE) tablet 30 mg, 30 mg, Oral, Daily with Breakfast   Followed by  Derrill Memo ON 06/10/2021] predniSONE (DELTASONE) tablet, 20 mg, Oral, Daily with Breakfast   Followed by  Derrill Memo ON 06/13/2021] predniSONE (DELTASONE) tablet, 10 mg, Oral, Daily with Breakfast          Physical Exam  BP (!) 126/45 Comment: RN notified   Pulse 65    Temp 37 C (98.6 F)    Resp 18    Ht 1.626 m (5\' 4" )    Wt 76.2 kg (167 lb 15.9 oz)    SpO2 96%    BMI 28.84 kg/m     Constitutional: AOx 3, NAD, appears age appropriate  Eyes: Conjunctiva  clear, Sclera non-icteric.   ENT: ENMT without erythema or injection, mucous membranes moist.  Neck: supple, symmetrical, trachea midline  Respiratory: ctab with some scattered wheeze, no rales.   Cardiovascular: RRR S1, S2 normal, no murmur, click, rub or gallop  Gastrointestinal: soft, non-tender, Bowel sounds normal, non-distended  Genitourinary: Deferred  Musculoskeletal: Head atraumatic and normocephalic. R sided chest tube. No significant  edema BLE  Integumentary:  Skin warm and dry, No rashes and No lesions  Neurologic: normal strength and tone. No tremor  Psychiatric: Normal affect, behavior    Labs:    CBC  Diff   Lab Results   Component Value Date/Time    WBC 8.2 06/04/2021 06:44 AM    HGB 9.7 (L) 06/04/2021 06:44 AM    HCT 32.9 (L) 06/04/2021 06:44 AM    PLTCNT 131 (L) 06/04/2021 06:44 AM    RBC 3.82 (L) 06/04/2021 06:44 AM    MCV 86.1 06/04/2021 06:44 AM    MCHC 29.5 (L) 06/04/2021 06:44 AM    MCH 25.4 (L) 06/04/2021 06:44 AM    MPV 12.3 06/02/2021 09:06 AM    Lab Results   Component Value Date/Time    PMNS 78 06/04/2021 06:44 AM    MONOCYTES 10 06/04/2021 06:44 AM    BASOPHILS 0 06/04/2021 06:44 AM    BASOPHILS <0.10 06/04/2021 06:44 AM    PMNABS 6.35 06/04/2021 06:44 AM    LYMPHSABS 0.89 (L) 06/04/2021 06:44 AM    EOSABS <0.10 06/04/2021 06:44 AM    MONOSABS 0.78 06/04/2021 06:44 AM          Comprehensive Metabolic Profile    Lab Results   Component Value Date/Time    SODIUM 140 06/03/2021 06:52 AM    POTASSIUM 3.9 06/03/2021 06:52 AM    CHLORIDE 100 06/03/2021 06:52 AM    CO2 36 (H) 06/03/2021 06:52 AM    ANIONGAP 4 06/03/2021 06:52 AM    BUN 30 (H) 06/03/2021 06:52 AM    CREATININE 0.72 06/03/2021 06:52 AM    GLUCOSE Negative 05/29/2021 07:53 PM    Lab Results   Component Value Date/Time    CALCIUM 8.4 (L) 06/03/2021 06:52 AM    PHOSPHORUS 2.7 06/03/2021 06:52 AM    ALBUMIN 3.4 05/28/2021 07:10 AM    TOTALPROTEIN 6.3 05/28/2021 07:10 AM    ALKPHOS 73 05/28/2021 07:10 AM    AST 20 05/28/2021 07:10 AM     ALT 18 05/28/2021 07:10 AM    TOTBILIRUBIN 0.9 05/28/2021 07:10 AM        CARDIAC MARKERS  Lab Results   Component Value Date    TROPONINI 18 05/28/2021            Radiology:   Last CXR Impression   XR AP MOBILE CHEST (Exam End: 06/04/2021  7:00 AM)    Impression    Chest tube removal otherwise unchanged   XR CHEST AP AND LATERAL (Exam End: 05/28/2021  8:51 AM)    Impression    Mild pulmonary edema pattern.       Results for orders placed during the hospital encounter of 05/28/21    TRANSTHORACIC ECHOCARDIOGRAM - ADULT 05/28/2021 12:14 PM    Narrative  **See full report in linked PDF document**    Transthoracic Echocardiographic Report    ______________________________________________________________________________  Name: SHONTE, BRIGNAC                         MRN: [id number]               Weight: 168 lb  Study Date: 05/28/2021 08:14 AM              DOB: June 16, 1951             Height: 64 in  Gender: Female  Age: 14 yrs                 BSA: 1.8 m2  Accession #: [id number]                    BP: 138/82 mmHg  Patient Location: [facility] [place]  Ordering Provider: Kathrine Cords  TechFN:[id number]    ______________________________________________________________________________  Procedure:  Transthoracic complete echo with contrast, 2D, spectral and tissue Doppler, color flow Doppler, M-mode.    Quality:  Technically difficult study due to limited acoustic windows.    Indications: Dyspnea,Respiratory failure (CMS HCC),Heart failure (CMS HCC),CAD (coronary artery disease)    Conclusions:  Left Ventricle: Normal left ventricular size. The left ventricular ejection fraction by visual assessment is estimated to be 65%. Regional wall motion abnormalities cannot be excluded due to limited  visualization. Left ventricular diastolic function is indeterminate.  Right Ventricle: Normal right ventricular size. Normal right ventricular systolic function.  Left Atrium: The left atrium is normal in size.  Right  Atrium: The right atrium is of normal size.    Findings  Left Ventricle:   Normal left ventricular size. The left ventricular ejection fraction by visual assessment is estimated to be 65%. Left ventricular systolic function is normal. Regional wall motion  abnormalities cannot be excluded due to limited visualization. Left ventricular diastolic function is indeterminate.  Right Ventricle:   Normal right ventricular size. Normal right ventricular systolic function. Right ventricular systolic pressure is indeterminate.  Left Atrium:   The left atrium is normal in size.  Right Atrium:   The right atrium is of normal size.  Mitral Valve:   The mitral valve is not well visualized. No significant mitral regurgitation present.  Tricuspid Valve:   The tricuspid valve is not well visualized. No significant tricuspid regurgitation present.  Aortic Valve:   The aortic valve is not well visualized. No Aortic valve stenosis. No significant aortic regurgitation present.  Pulmonic Valve:   The pulmonic valve is not well visualized.  Pulmonary Artery:   The pulmonary artery is not well visualized.  Atrial Septum:   The interatrial septum is not well visualized.  IVC/Hepatic Veins:   The inferior vena cava was not visualized.  Aorta:   The aortic root is of normal size.  Pericardium/Pleural space:   Normal pericardium with no pericardial effusion.    Electronically signed by: MD Sammuel Cooper on 05/28/2021 12:14 PM

## 2021-06-06 DIAGNOSIS — I5030 Unspecified diastolic (congestive) heart failure: Secondary | ICD-10-CM

## 2021-06-06 DIAGNOSIS — R339 Retention of urine, unspecified: Secondary | ICD-10-CM

## 2021-06-06 DIAGNOSIS — J9611 Chronic respiratory failure with hypoxia: Secondary | ICD-10-CM

## 2021-06-06 DIAGNOSIS — Z9981 Dependence on supplemental oxygen: Secondary | ICD-10-CM

## 2021-06-06 DIAGNOSIS — F39 Unspecified mood [affective] disorder: Secondary | ICD-10-CM

## 2021-06-06 DIAGNOSIS — J853 Abscess of mediastinum: Secondary | ICD-10-CM

## 2021-06-06 DIAGNOSIS — B379 Candidiasis, unspecified: Secondary | ICD-10-CM

## 2021-06-06 DIAGNOSIS — J441 Chronic obstructive pulmonary disease with (acute) exacerbation: Secondary | ICD-10-CM

## 2021-06-06 DIAGNOSIS — K219 Gastro-esophageal reflux disease without esophagitis: Secondary | ICD-10-CM

## 2021-06-06 MED ORDER — SENNOSIDES 8.6 MG-DOCUSATE SODIUM 50 MG TABLET
2.0000 | ORAL_TABLET | Freq: Two times a day (BID) | ORAL | Status: DC
Start: 2021-06-06 — End: 2021-06-08
  Administered 2021-06-06: 20:00:00 2 via ORAL
  Administered 2021-06-07: 21:00:00 0 via ORAL
  Administered 2021-06-07 – 2021-06-08 (×2): 2 via ORAL
  Filled 2021-06-06 (×4): qty 2

## 2021-06-06 MED ORDER — POLYETHYLENE GLYCOL 3350 17 GRAM ORAL POWDER PACKET
34.0000 g | Freq: Two times a day (BID) | ORAL | Status: DC
Start: 2021-06-06 — End: 2021-06-08
  Administered 2021-06-06: 20:00:00 34 g via ORAL
  Administered 2021-06-07: 21:00:00 0 g via ORAL
  Administered 2021-06-07 – 2021-06-08 (×2): 34 g via ORAL
  Filled 2021-06-06 (×5): qty 2

## 2021-06-06 MED ORDER — SENNOSIDES 8.6 MG-DOCUSATE SODIUM 50 MG TABLET
1.0000 | ORAL_TABLET | Freq: Two times a day (BID) | ORAL | Status: DC
Start: 2021-06-06 — End: 2021-06-06
  Administered 2021-06-06: 13:00:00 1 via ORAL
  Filled 2021-06-06: qty 1

## 2021-06-06 MED ORDER — POLYETHYLENE GLYCOL 3350 17 GRAM ORAL POWDER PACKET
17.0000 g | Freq: Every day | ORAL | Status: DC
Start: 2021-06-06 — End: 2021-06-06
  Administered 2021-06-06: 13:00:00 17 g via ORAL
  Filled 2021-06-06: qty 1

## 2021-06-06 NOTE — Nurses Notes (Signed)
Patient is A&O x4. VS stable. Fall precautions maintained. Patient denies any pain at this time. Patient negative for n/v and diarrhea. Assessment per Flowsheet. Patient has no other complaints at this time.

## 2021-06-06 NOTE — Nurses Notes (Signed)
Assessed and passed medications. Patient is A&O X4. Denies pain or NV. Talked about plan of care for the day. Assessment charted per flowsheet. High fall precautions maintained with sitter select

## 2021-06-06 NOTE — Progress Notes (Addendum)
[facility]  Medicine Progress Note    Misty Johnson  Date of service: 06/06/2021  Date of Admission:  05/28/2021    Hospital Day:  LOS: 9 days       Assessment/ Plan:   Active Hospital Problems    Diagnosis   . Primary Problem: Mediastinal mass       Misty Johnson a 70 y.o.,[place] medical history of CAD, HFrEF, COPD and chronic hypoxic respiratory failure on 3L NC at home who presented to [facility] due to having SOB and hoarseness for the last few daysfound to have COPD exacerbation and mediastinal mass.    Chronic hypoxic respiratory failure 2/2 COPD - initially in acute COPD exacerbation, slight worsening overnight  - Duonebs schedule with albuterol PRN. Patient states he typically uses neb treatments at home multiple times a day  - Pulmonary toilet  - Symbicort BID  - completed Prednisone 40 mg daily for 5 days. Continue taper as patient did not tolerate sudden cessation of steroids  - completed Doxycycline 40 mg daily for 5 days (end date 11/29)  - Continue with 3L NC with goal SpO2 88%+    Mediastinal mass and hoarseness  - thoracic surgery following,s/p R VATS robotic assisted anterior mediastinal mass resection, follow up path  - CT scan images are uploaded on Synapse  - LDH 229, HCG 10  - myasthenia panel pending although low suspicion  - PRN PO narcotics    Thrush - nystatin swish and swallow x 14 days    Acute urinary retention  - urine culture with candida glabarata  - bladder scan TID   - foley present from surgery. Foley removed 12/3.   - bladder scan and PRN straight cath orders placed for retention    CAD  HFpEF  - Continue Lipitor  - restart ASA, OK per thoracics  - TTE with EF of 65%. Technically difficult study due to limited acoustic windows.     Mood disorder  - Continue lexapro    GERD  - continue PPI    Body mass index is 28.84 kg/m.    Hardware (lines, foley's, tubes):  Patient Lines/Drains/Airways Status     Active Line / Dialysis Catheter  / Dialysis Graft / Drain / Airway / Wound     Name Placement date Placement time Site Days    Peripheral IV Ultrasound guided Left Forearm 06/01/21  1030  -- 5    Peripheral IV Distal;Right Basilic  (medial side of arm) 06/01/21  1200  -- 5    Surgical Incision Right;Anterior;Lateral Chest 06/01/21  1115  -- 5                 DVT/PE Prophylaxis: lovenox    Disposition Planning: IPR - patient now agreeable, awaiting placement    [name], MD  [facility]  Assistant Professor of Medicine  Pager [phone]    ____________________________________________________________________________________________________________________________________________________________________    Chief Complaint: mediastinal mass  Subjective: NAEO. Patient reportedly without Bm recently. States her breathing is about her normal. Denied cp, sob, nausea, vomiting, abd pain.     Daughter called and updated    Vital Signs:  Temp  Avg: 36.9 C (98.5 F)  Min: 36.5 C (97.7 F)  Max: 37.3 C (99.1 F)    Pulse  Avg: 62  Min: 55  Max: 72 BP  Min: 112/41  Max: 133/44   Resp  Avg: 16.7  Min: 16  Max: 18 SpO2  Avg: 96.5 %  Min:  95 %  Max: 98 %          Input/Output    Intake/Output Summary (Last 24 hours) at 06/06/2021 1343  Last data filed at 06/06/2021 1026  Gross per 24 hour   Intake 3375 ml   Output 500 ml   Net 2875 ml    I/O last shift:  12/04 0700 - 12/04 1859  In: 30 [P.O.:30]  Out: 500 [Urine:500]   acetaminophen (TYLENOL) tablet, 650 mg, Oral, Q4H PRN  albuterol (PROVENTIL) 2.5 mg / 3 mL (0.083%) neb solution, 2.5 mg, Nebulization, Q4H PRN  aspirin chewable tablet 81 mg, 81 mg, Oral, Daily  atorvastatin (LIPITOR) tablet, 40 mg, Oral, QPM  budesonide-formoterol (SYMBICORT) 160 mcg-4.5 mcg per inhalation oral inhaler - "Nursing to administer", 2 Puff, Inhalation, 2x/day  calcium carbonate (TUMS) 500mg  (200mg  elemental calcium) chewable tablet, 500 mg, Oral, 3x/day PRN  D5W 250 mL flush bag, , Intravenous, Q15 Min PRN  D5W 250 mL flush  bag, , Intravenous, Q15 Min PRN  enoxaparin PF (LOVENOX) 40 mg/0.4 mL SubQ injection, 40 mg, Subcutaneous, Daily  escitalopram (LEXAPRO) tablet, 20 mg, Oral, Daily  guaiFENesin 100mg  per 60mL oral liquid - for cough (expectorant), 200 mg, Oral, Q4H PRN  HYDROmorphone (DILAUDID) 1 mg/mL injection, 0.5 mg, Intravenous, Q3H PRN  ipratropium-albuterol 0.5 mg-3 mg(2.5 mg base)/3 mL Solution for Nebulization, 3 mL, Nebulization, 4x/day  loratadine (CLARITIN) tablet, 10 mg, Oral, Daily  NS 250 mL flush bag, , Intravenous, Q15 Min PRN  NS 250 mL flush bag, , Intravenous, Q15 Min PRN  NS flush syringe, 2-6 mL, Intracatheter, Q8HRS  NS flush syringe, 2-6 mL, Intracatheter, Q1 MIN PRN  NS flush syringe, 2-6 mL, Intracatheter, Q8HRS  NS flush syringe, 2-6 mL, Intracatheter, Q1 MIN PRN  nystatin (MYCOSTATIN) 100,000 units per mL oral liquid, 5 mL, Swish & Swallow, 4x/day  ondansetron (ZOFRAN) 2 mg/mL injection, 4 mg, Intravenous, Q6H PRN  oxyCODONE (ROXICODONE) immediate release tablet, 5 mg, Oral, Q4H PRN   Or  oxyCODONE (ROXICODONE) immediate release tablet, 10 mg, Oral, Q4H PRN  pantoprazole (PROTONIX) delayed release tablet, 20 mg, Oral, Daily before Breakfast  polyethylene glycol (MIRALAX) oral packet, 34 g, Oral, 2x/day  [START ON 06/07/2021] predniSONE (DELTASONE) tablet 30 mg, 30 mg, Oral, Daily with Breakfast   Followed by  Derrill Memo ON 06/10/2021] predniSONE (DELTASONE) tablet, 20 mg, Oral, Daily with Breakfast   Followed by  Derrill Memo ON 06/13/2021] predniSONE (DELTASONE) tablet, 10 mg, Oral, Daily with Breakfast  sennosides-docusate sodium (SENOKOT-S) 8.6-50mg  per tablet, 2 Tablet, Oral, 2x/day          Physical Exam  BP (!) 122/44 Comment: RN notified  Pulse 66   Temp 36.9 C (98.4 F)   Resp 16   Ht 1.626 m (5\' 4" )   Wt 76.2 kg (167 lb 15.9 oz)   SpO2 95%   BMI 28.84 kg/m     Constitutional: AOx 3, NAD, appears age appropriate  Eyes: Conjunctiva clear, Sclera non-icteric.   ENT: ENMT without erythema or injection,  mucous membranes moist.  Neck: supple, symmetrical, trachea midline  Respiratory: ctab with some scattered wheeze, no rales.   Cardiovascular: RRR S1, S2 normal, no murmur, click, rub or gallop  Gastrointestinal: soft, non-tender, Bowel sounds normal, non-distended  Genitourinary: Deferred  Musculoskeletal: Head atraumatic and normocephalic. R sided chest tube. Trace edema in BLE  Integumentary:  Skin warm and dry, No rashes and No lesions  Neurologic: normal strength and tone. No tremor  Psychiatric: Normal affect, behavior  Labs:    CBC  Diff   Lab Results   Component Value Date/Time    WBC 8.2 06/04/2021 06:44 AM    HGB 9.7 (L) 06/04/2021 06:44 AM    HCT 32.9 (L) 06/04/2021 06:44 AM    PLTCNT 131 (L) 06/04/2021 06:44 AM    RBC 3.82 (L) 06/04/2021 06:44 AM    MCV 86.1 06/04/2021 06:44 AM    MCHC 29.5 (L) 06/04/2021 06:44 AM    MCH 25.4 (L) 06/04/2021 06:44 AM    MPV 12.3 06/02/2021 09:06 AM    Lab Results   Component Value Date/Time    PMNS 78 06/04/2021 06:44 AM    MONOCYTES 10 06/04/2021 06:44 AM    BASOPHILS 0 06/04/2021 06:44 AM    BASOPHILS <0.10 06/04/2021 06:44 AM    PMNABS 6.35 06/04/2021 06:44 AM    LYMPHSABS 0.89 (L) 06/04/2021 06:44 AM    EOSABS <0.10 06/04/2021 06:44 AM    MONOSABS 0.78 06/04/2021 06:44 AM          Comprehensive Metabolic Profile    Lab Results   Component Value Date/Time    SODIUM 140 06/03/2021 06:52 AM    POTASSIUM 3.9 06/03/2021 06:52 AM    CHLORIDE 100 06/03/2021 06:52 AM    CO2 36 (H) 06/03/2021 06:52 AM    ANIONGAP 4 06/03/2021 06:52 AM    BUN 30 (H) 06/03/2021 06:52 AM    CREATININE 0.72 06/03/2021 06:52 AM    GLUCOSE Negative 05/29/2021 07:53 PM    Lab Results   Component Value Date/Time    CALCIUM 8.4 (L) 06/03/2021 06:52 AM    PHOSPHORUS 2.7 06/03/2021 06:52 AM    ALBUMIN 3.4 05/28/2021 07:10 AM    TOTALPROTEIN 6.3 05/28/2021 07:10 AM    ALKPHOS 73 05/28/2021 07:10 AM    AST 20 05/28/2021 07:10 AM    ALT 18 05/28/2021 07:10 AM    TOTBILIRUBIN 0.9 05/28/2021 07:10 AM         CARDIAC MARKERS  Lab Results   Component Value Date    TROPONINI 18 05/28/2021            Radiology:   Last CXR Impression   XR AP MOBILE CHEST (Exam End: 06/04/2021  7:00 AM)    Impression    Chest tube removal otherwise unchanged   XR CHEST AP AND LATERAL (Exam End: 05/28/2021  8:51 AM)    Impression    Mild pulmonary edema pattern.       Results for orders placed during the hospital encounter of 05/28/21    TRANSTHORACIC ECHOCARDIOGRAM - ADULT 05/28/2021 12:14 PM    Narrative  **See full report in linked PDF document**    Transthoracic Echocardiographic Report    ______________________________________________________________________________  Name: ALENIS, TRYBA                         MRN: [id number]               Weight: 168 lb  Study Date: 05/28/2021 08:14 AM              DOB: 1951-03-04             Height: 64 in  Gender: Female                               Age: 85 yrs  BSA: 1.8 m2  Accession #: [id number]                    BP: 138/82 mmHg  Patient Location: [facility] [place]  Ordering Provider: Adline Mango  Tech: [id number]    ______________________________________________________________________________  Procedure:  Transthoracic complete echo with contrast, 2D, spectral and tissue Doppler, color flow Doppler, M-mode.    Quality:  Technically difficult study due to limited acoustic windows.    Indications: Dyspnea,Respiratory failure (CMS HCC),Heart failure (CMS HCC),CAD (coronary artery disease)    Conclusions:  Left Ventricle: Normal left ventricular size. The left ventricular ejection fraction by visual assessment is estimated to be 65%. Regional wall motion abnormalities cannot be excluded due to limited  visualization. Left ventricular diastolic function is indeterminate.  Right Ventricle: Normal right ventricular size. Normal right ventricular systolic function.  Left Atrium: The left atrium is normal in size.  Right Atrium: The right atrium is of normal size.    Findings  Left Ventricle:    Normal left ventricular size. The left ventricular ejection fraction by visual assessment is estimated to be 65%. Left ventricular systolic function is normal. Regional wall motion  abnormalities cannot be excluded due to limited visualization. Left ventricular diastolic function is indeterminate.  Right Ventricle:   Normal right ventricular size. Normal right ventricular systolic function. Right ventricular systolic pressure is indeterminate.  Left Atrium:   The left atrium is normal in size.  Right Atrium:   The right atrium is of normal size.  Mitral Valve:   The mitral valve is not well visualized. No significant mitral regurgitation present.  Tricuspid Valve:   The tricuspid valve is not well visualized. No significant tricuspid regurgitation present.  Aortic Valve:   The aortic valve is not well visualized. No Aortic valve stenosis. No significant aortic regurgitation present.  Pulmonic Valve:   The pulmonic valve is not well visualized.  Pulmonary Artery:   The pulmonary artery is not well visualized.  Atrial Septum:   The interatrial septum is not well visualized.  IVC/Hepatic Veins:   The inferior vena cava was not visualized.  Aorta:   The aortic root is of normal size.  Pericardium/Pleural space:   Normal pericardium with no pericardial effusion.    Electronically signed by: MD Thomos Lemons on 05/28/2021 12:14 PM

## 2021-06-06 NOTE — Nurses Notes (Addendum)
Hulan Fray notified patient has not had a BM since 05/28/21    1103- orders placed for Miralax and senokot

## 2021-06-06 NOTE — Nurses Notes (Signed)
Bladder Scan 01:30 - x3 Pt encouraged to drink more, no urge to void     Bladder Scan 05:30 - x3 Pt encouraged to drink more, no urge to void    Pt has low PO fluid intake, No IV fluid intake, Pt educated she will need to be cathed by her next bladder scan if no void.

## 2021-06-06 NOTE — Nurses Notes (Signed)
Patient straight cath for 500 ml , Bladder scan was 400 ml

## 2021-06-07 DIAGNOSIS — I5032 Chronic diastolic (congestive) heart failure: Secondary | ICD-10-CM

## 2021-06-07 LAB — BASIC METABOLIC PANEL
ANION GAP: 9 mmol/L (ref 4–13)
BUN/CREA RATIO: 28 — ABNORMAL HIGH (ref 6–22)
BUN: 17 mg/dL (ref 8–25)
CALCIUM: 8.6 mg/dL — ABNORMAL LOW (ref 8.8–10.2)
CHLORIDE: 102 mmol/L (ref 96–111)
CO2 TOTAL: 29 mmol/L (ref 23–31)
CREATININE: 0.6 mg/dL (ref 0.60–1.05)
ESTIMATED GFR: >90 mL/min/BSA (ref >=60)
GLUCOSE: 75 mg/dL (ref 65–125)
POTASSIUM: 4.2 mmol/L (ref 3.5–5.1)
SODIUM: 140 mmol/L (ref 136–145)

## 2021-06-07 LAB — CBC WITH DIFF
BASOPHIL #: <0.1 10*3/uL (ref <=0.20)
BASOPHIL %: 0 %
EOSINOPHIL #: <0.1 10*3/uL (ref <=0.50)
EOSINOPHIL %: 0 %
HCT: 32.6 % — ABNORMAL LOW (ref 34.8–46.0)
HGB: 9.9 g/dL — ABNORMAL LOW (ref 11.5–16.0)
IMMATURE GRANULOCYTE #: <0.1 10*3/uL (ref <0.10)
IMMATURE GRANULOCYTE %: 1 % (ref 0–1)
LYMPHOCYTE #: 0.76 10*3/uL — ABNORMAL LOW (ref 1.00–4.80)
LYMPHOCYTE %: 12 %
MCH: 25.3 pg — ABNORMAL LOW (ref 26.0–32.0)
MCHC: 30.4 g/dL — ABNORMAL LOW (ref 31.0–35.5)
MCV: 83.4 fL (ref 78.0–100.0)
MONOCYTE #: 0.61 10*3/uL (ref 0.20–1.10)
MONOCYTE %: 10 %
MPV: 11.4 fL (ref 8.7–12.5)
NEUTROPHIL #: 4.72 10*3/uL (ref 1.50–7.70)
NEUTROPHIL %: 77 %
PLATELETS: 150 10*3/uL (ref 150–400)
RBC: 3.91 10*6/uL (ref 3.85–5.22)
RDW-CV: 16.7 % — ABNORMAL HIGH (ref 11.5–15.5)
WBC: 6.2 10*3/uL (ref 3.7–11.0)

## 2021-06-07 LAB — COVID-19 ~~LOC~~ MOLECULAR LAB TESTING
INFLUENZA VIRUS TYPE A: NOT DETECTED
INFLUENZA VIRUS TYPE B: NOT DETECTED
RESPIRATORY SYNCTIAL VIRUS (RSV): NOT DETECTED
SARS-CoV-2: NOT DETECTED

## 2021-06-07 MED ORDER — TAMSULOSIN 0.4 MG CAPSULE
0.4000 mg | ORAL_CAPSULE | Freq: Every evening | ORAL | Status: DC
Start: 2021-06-07 — End: 2021-06-08
  Administered 2021-06-07: 22:00:00 0.4 mg via ORAL
  Filled 2021-06-07: qty 1

## 2021-06-07 NOTE — Progress Notes (Signed)
[facility]  Medicine Progress Note    Misty Johnson  Date of service: 06/07/2021  Date of Admission:  05/28/2021    Hospital Day:  LOS: 10 days       Assessment/ Plan:   Active Hospital Problems    Diagnosis    Primary Problem: Mediastinal mass       Misty Morrisis a 70 y.o.,[place] medical history of CAD, HFrEF, COPD and chronic hypoxic respiratory failure on 3L NC at home who presented to [facility] due to having SOB and hoarseness for the last few daysfound to have COPD exacerbation and mediastinal mass.    Chronic hypoxic respiratory failure 2/2 COPD - initially in acute COPD exacerbation, slight worsening overnight  - Duonebs schedule with albuterol PRN. Patient states he typically uses neb treatments at home multiple times a day  - Pulmonary toilet  - Symbicort BID  - completed Prednisone 40 mg daily for 5 days. Continue taper as patient did not tolerate sudden cessation of steroids  - completed Doxycycline 40 mg daily for 5 days (end date 11/29)  - Continue with 3L NC with goal SpO2 88%+    Mediastinal mass and hoarseness  - thoracic surgery following,s/p R VATS robotic assisted anterior mediastinal mass resection, follow up path  - CT scan images are uploaded on Synapse  - LDH 229, HCG 10  - myasthenia panel pending although low suspicion  - PRN PO narcotics    Thrush - nystatin swish and swallow x 14 days    Acute urinary retention  - urine culture with candida glabarata  - bladder scan TID   - foley present from surgery. Foley removed 12/3.   - bladder scan and PRN straight cath orders placed for retention  - trial flomax    CAD   HFpEF  - Continue Lipitor  - ASA, OK per thoracics  - TTE with EF of 65%. Technically difficult study due to limited acoustic windows.     Mood disorder  - Continue lexapro    GERD  - continue PPI    Body mass index is 28.84 kg/m.    Hardware (lines, foley's, tubes):  Patient Lines/Drains/Airways Status     Active Line / Dialysis  Catheter / Dialysis Graft / Drain / Airway / Wound     Name Placement date Placement time Site Days    Peripheral IV Ultrasound guided Left Forearm 06/01/21  1030  -- 6    Peripheral IV Distal;Right Basilic  (medial side of arm) 06/01/21  1200  -- 6    Surgical Incision Right;Anterior;Lateral Chest 06/01/21  1115  -- 6                 DVT/PE Prophylaxis: lovenox    Disposition Planning: IPR - patient agreeable, awaiting placement    Murtis Sink, MD  [facility]  Assistant Professor of Medicine  Pager [phone]    ____________________________________________________________________________________________________________________________________________________________________    Chief Complaint: mediastinal mass  Subjective: NAEO. Patient resting in bed. States breathing is about her baseline. Still with issues surrounding urinary retention. States he can feel her bladder being full but has a hard time urinating.     Vital Signs:  Temp  Avg: 36.8 C (98.3 F)  Min: 36.4 C (97.5 F)  Max: 37 C (98.6 F)    Pulse  Avg: 66.2  Min: 63  Max: 68 BP  Min: 112/48  Max: 118/48   Resp  Avg: 18  Min: 16  Max: 20  SpO2  Avg: 94.5 %  Min: 92 %  Max: 96 %          Input/Output    Intake/Output Summary (Last 24 hours) at 06/07/2021 1638  Last data filed at 06/07/2021 1622  Gross per 24 hour   Intake 460 ml   Output 500 ml   Net -40 ml    I/O last shift:  12/05 0700 - 12/05 1859  In: 51 [P.O.:460]  Out: 0    acetaminophen (TYLENOL) tablet, 650 mg, Oral, Q4H PRN  albuterol (PROVENTIL) 2.5 mg / 3 mL (0.083%) neb solution, 2.5 mg, Nebulization, Q4H PRN  aspirin chewable tablet 81 mg, 81 mg, Oral, Daily  atorvastatin (LIPITOR) tablet, 40 mg, Oral, QPM  budesonide-formoterol (SYMBICORT) 160 mcg-4.5 mcg per inhalation oral inhaler - "Nursing to administer", 2 Puff, Inhalation, 2x/day  calcium carbonate (TUMS) 500mg  (200mg  elemental calcium) chewable tablet, 500 mg, Oral, 3x/day PRN  D5W 250 mL flush bag, , Intravenous, Q15 Min  PRN  D5W 250 mL flush bag, , Intravenous, Q15 Min PRN  enoxaparin PF (LOVENOX) 40 mg/0.4 mL SubQ injection, 40 mg, Subcutaneous, Daily  escitalopram (LEXAPRO) tablet, 20 mg, Oral, Daily  guaiFENesin 100mg  per 24mL oral liquid - for cough (expectorant), 200 mg, Oral, Q4H PRN  ipratropium-albuterol 0.5 mg-3 mg(2.5 mg base)/3 mL Solution for Nebulization, 3 mL, Nebulization, 4x/day  loratadine (CLARITIN) tablet, 10 mg, Oral, Daily  NS 250 mL flush bag, , Intravenous, Q15 Min PRN  NS 250 mL flush bag, , Intravenous, Q15 Min PRN  NS flush syringe, 2-6 mL, Intracatheter, Q8HRS  NS flush syringe, 2-6 mL, Intracatheter, Q1 MIN PRN  NS flush syringe, 2-6 mL, Intracatheter, Q8HRS  NS flush syringe, 2-6 mL, Intracatheter, Q1 MIN PRN  nystatin (MYCOSTATIN) 100,000 units per mL oral liquid, 5 mL, Swish & Swallow, 4x/day  ondansetron (ZOFRAN) 2 mg/mL injection, 4 mg, Intravenous, Q6H PRN  oxyCODONE (ROXICODONE) immediate release tablet, 5 mg, Oral, Q4H PRN   Or  oxyCODONE (ROXICODONE) immediate release tablet, 10 mg, Oral, Q4H PRN  pantoprazole (PROTONIX) delayed release tablet, 20 mg, Oral, Daily before Breakfast  polyethylene glycol (MIRALAX) oral packet, 34 g, Oral, 2x/day  predniSONE (DELTASONE) tablet 30 mg, 30 mg, Oral, Daily with Breakfast   Followed by  Derrill Memo ON 06/10/2021] predniSONE (DELTASONE) tablet, 20 mg, Oral, Daily with Breakfast   Followed by  Derrill Memo ON 06/13/2021] predniSONE (DELTASONE) tablet, 10 mg, Oral, Daily with Breakfast  sennosides-docusate sodium (SENOKOT-S) 8.6-50mg  per tablet, 2 Tablet, Oral, 2x/day          Physical Exam  BP (!) 114/46    Pulse 67    Temp 36.9 C (98.4 F)    Resp 18    Ht 1.626 m (5\' 4" )    Wt 76.2 kg (167 lb 15.9 oz)    SpO2 96%    BMI 28.84 kg/m     Constitutional: AOx 3, NAD, appears age appropriate  Eyes: Conjunctiva clear, Sclera non-icteric.   ENT: ENMT without erythema or injection, mucous membranes moist.  Neck: supple, symmetrical, trachea midline  Respiratory: ctab,  diminished no wheeze or rales.   Cardiovascular: RRR S1, S2 normal, no murmur, click, rub or gallop  Gastrointestinal: soft, non-tender, Bowel sounds normal, non-distended  Genitourinary: Deferred  Musculoskeletal: Head atraumatic and normocephalic. R sided chest tube. traceedema in BLE  Integumentary:  Skin warm and dry, No rashes and No lesions  Neurologic: normal strength and tone. No tremor  Psychiatric: Normal affect, behavior    Labs:  CBC  Diff   Lab Results   Component Value Date/Time    WBC 6.2 06/07/2021 06:23 AM    HGB 9.9 (L) 06/07/2021 06:23 AM    HCT 32.6 (L) 06/07/2021 06:23 AM    PLTCNT 150 06/07/2021 06:23 AM    RBC 3.91 06/07/2021 06:23 AM    MCV 83.4 06/07/2021 06:23 AM    MCHC 30.4 (L) 06/07/2021 06:23 AM    MCH 25.3 (L) 06/07/2021 06:23 AM    MPV 11.4 06/07/2021 06:23 AM    Lab Results   Component Value Date/Time    PMNS 77 06/07/2021 06:23 AM    MONOCYTES 10 06/07/2021 06:23 AM    BASOPHILS 0 06/07/2021 06:23 AM    BASOPHILS <0.10 06/07/2021 06:23 AM    PMNABS 4.72 06/07/2021 06:23 AM    LYMPHSABS 0.76 (L) 06/07/2021 06:23 AM    EOSABS <0.10 06/07/2021 06:23 AM    MONOSABS 0.61 06/07/2021 06:23 AM          Comprehensive Metabolic Profile    Lab Results   Component Value Date/Time    SODIUM 140 06/07/2021 06:23 AM    POTASSIUM 4.2 06/07/2021 06:23 AM    CHLORIDE 102 06/07/2021 06:23 AM    CO2 29 06/07/2021 06:23 AM    ANIONGAP 9 06/07/2021 06:23 AM    BUN 17 06/07/2021 06:23 AM    CREATININE 0.60 06/07/2021 06:23 AM    GLUCOSE Negative 05/29/2021 07:53 PM    Lab Results   Component Value Date/Time    CALCIUM 8.6 (L) 06/07/2021 06:23 AM    PHOSPHORUS 2.7 06/03/2021 06:52 AM    ALBUMIN 3.4 05/28/2021 07:10 AM    TOTALPROTEIN 6.3 05/28/2021 07:10 AM    ALKPHOS 73 05/28/2021 07:10 AM    AST 20 05/28/2021 07:10 AM    ALT 18 05/28/2021 07:10 AM    TOTBILIRUBIN 0.9 05/28/2021 07:10 AM        CARDIAC MARKERS  Lab Results   Component Value Date    TROPONINI 18 05/28/2021            Radiology:   Last  CXR Impression   XR AP MOBILE CHEST (Exam End: 06/04/2021  7:00 AM)    Impression    Chest tube removal otherwise unchanged   XR CHEST AP AND LATERAL (Exam End: 05/28/2021  8:51 AM)    Impression    Mild pulmonary edema pattern.       Results for orders placed during the hospital encounter of 05/28/21    TRANSTHORACIC ECHOCARDIOGRAM - ADULT 05/28/2021 12:14 PM    Narrative  **See full report in linked PDF document**    Transthoracic Echocardiographic Report    ______________________________________________________________________________  Name: Misty Johnson, Misty Johnson                         MRN: [id number]               Weight: 168 lb  Study Date: 05/28/2021 08:14 AM              DOB: 09-16-1950             Height: 64 in  Gender: Female                               Age: 81 yrs                 BSA: 1.8 m2  Accession #: [id number]                    BP: 138/82 mmHg  Patient Location: [facility] [place]  Ordering Provider: Kathrine Cords  TechFN:[id number]    ______________________________________________________________________________  Procedure:  Transthoracic complete echo with contrast, 2D, spectral and tissue Doppler, color flow Doppler, M-mode.    Quality:  Technically difficult study due to limited acoustic windows.    Indications: Dyspnea,Respiratory failure (CMS HCC),Heart failure (CMS HCC),CAD (coronary artery disease)    Conclusions:  Left Ventricle: Normal left ventricular size. The left ventricular ejection fraction by visual assessment is estimated to be 65%. Regional wall motion abnormalities cannot be excluded due to limited  visualization. Left ventricular diastolic function is indeterminate.  Right Ventricle: Normal right ventricular size. Normal right ventricular systolic function.  Left Atrium: The left atrium is normal in size.  Right Atrium: The right atrium is of normal size.    Findings  Left Ventricle:   Normal left ventricular size. The left ventricular ejection fraction by visual assessment is estimated to be  65%. Left ventricular systolic function is normal. Regional wall motion  abnormalities cannot be excluded due to limited visualization. Left ventricular diastolic function is indeterminate.  Right Ventricle:   Normal right ventricular size. Normal right ventricular systolic function. Right ventricular systolic pressure is indeterminate.  Left Atrium:   The left atrium is normal in size.  Right Atrium:   The right atrium is of normal size.  Mitral Valve:   The mitral valve is not well visualized. No significant mitral regurgitation present.  Tricuspid Valve:   The tricuspid valve is not well visualized. No significant tricuspid regurgitation present.  Aortic Valve:   The aortic valve is not well visualized. No Aortic valve stenosis. No significant aortic regurgitation present.  Pulmonic Valve:   The pulmonic valve is not well visualized.  Pulmonary Artery:   The pulmonary artery is not well visualized.  Atrial Septum:   The interatrial septum is not well visualized.  IVC/Hepatic Veins:   The inferior vena cava was not visualized.  Aorta:   The aortic root is of normal size.  Pericardium/Pleural space:   Normal pericardium with no pericardial effusion.    Electronically signed by: MD Sammuel Cooper on 05/28/2021 12:14 PM

## 2021-06-07 NOTE — Care Management Notes (Signed)
[facility]  Care Management Note    Patient Name: Misty Johnson  Date of Birth: [birth year]  Sex: female  Date/Time of Admission: 05/28/2021  4:57 AM  Room/Bed: 904/A  Payor: MEDICARE / Plan: MEDICARE PART A AND B / Product Type: Medicare /    LOS: 10 days   Primary Care Providers:  [name], FNP-C, FNP-C (General)    Admitting Diagnosis:  Mediastinal mass [J98.59]    Assessment:      06/07/21 1500   Assessment Details   Assessment Type Continued Assessment   Date of Care Management Update 06/07/21   Date of Next DCP Update 06/10/21   Care Management Plan   Discharge Planning Status plan in progress   Projected Discharge Date 06/08/21   Discharge plan discussed with: Patient   Discharge Needs Assessment   Discharge Facility/Level of Care Needs Acute Rehab Placement/Return (not psych)(code 5)   Transportation Available family or friend will provide;car       Discharge Plan:  Acute Rehab Placement/Return (not psych) (code 39)  Per service, pt is medically stable for d/c today. MSW spoke with Danae Chen from [facility] [phone] to discuss pt. Per Danae Chen, they are able to accept pt tomorrow. MSW met with pt at bedside to discuss d/c plan and need for private vehicle transport. Pt requests MSW phone her daughter. MSW phoned daughter [name] at number listed on face sheet. She reports she will be able to transport pt tomorrow and to call her with a time tomorrow. Service updated, COVID swab requested. MSW continuing to follow - anticipate d/c to [facility] tomorrow.     The patient will continue to be evaluated for developing discharge needs.     Case Manager: Vicente Serene, [place]  Phone: [phone]

## 2021-06-07 NOTE — Care Plan (Signed)
[facility]  Rehabilitation Services  Physical Therapy Progress Note      Patient Name: Misty Johnson  Date of Birth: February 06, 1951  Height:  162.6 cm (5\' 4" )  Weight:  76.2 kg (167 lb 15.9 oz)  Room/Bed: 904/A  Payor: MEDICARE / Plan: MEDICARE PART A AND B / Product Type: Medicare /     Assessment:     Pt performed well. Gait, FWW, 1+1 chair follow, CGA required for 19ft total (7ftx2trials). Pt required extended seated rest break d/t fatigue, SOB. Cont to anticipate IRF upon d/c of stay.    Discharge Needs:   Equipment Recommendation: none anticipated    Discharge Disposition: inpatient rehabilitation facility    JUSTIFICATION OF DISCHARGE RECOMMENDATION   Based on current diagnosis, functional performance prior to admission, and current functional performance, this patient requires continued PT services in inpatient rehabilitation facility in order to achieve significant functional improvements in these deficit areas: aerobic capacity/endurance, gait, locomotion, and balance, muscle performance.      Plan:   Continue to follow patient according to established plan of care.  The risks/benefits of therapy have been discussed with the patient/caregiver and he/she is in agreement with the established plan of care.     Subjective & Objective:        06/07/21 1401   Therapist Pager   PT Assigned/ Pager # [phone]/05/22 [phone]   Rehab Session   Document Type therapy progress note (daily note)   Total PT Minutes: 21   Patient Effort good   Symptoms Noted During/After Treatment shortness of breath;fatigue;increased pain   Symptoms Noted Comment Pt experienced SOB, fatigue and pain near chest tube site during session, not rated.   General Information   Patient Profile Reviewed yes   Medical Lines PIV Line   Respiratory Status nasal cannula   Existing Precautions/Restrictions fall precautions   Mutuality/Individual Preferences   Individualized Care Needs OOB w/ FWW, Ax1   Pre Treatment Status   Pre Treatment Patient Status  Patient supine in bed;Call light within reach;Telephone within reach;Sitter select activated;Nurse approved session   Support Present Pre Treatment  None   Communication Pre Treatment  Nurse   Communication Pre Treatment Comment RN approved session.   Cognitive Assessment/Interventions   Behavior/Mood Observations behavior appropriate to situation, WNL/WFL   Orientation Status oriented x 4   Attention WNL/WFL   Follows Commands WNL   Vital Signs   O2 Delivery Pre Treatment supplemental O2   O2 Delivery Post Treatment supplemental O2   Vitals Comment Pt exhibited SOB throughout session that was manageable.   Bed Mobility Assessment/Treatment   Bed Mobility, Assistive Device Head of Bed Elevated   Supine-Sit Independence stand-by assistance   Sit to Supine, Independence not tested   Safety Issues decreased use of legs for bridging/pushing;impaired trunk control for bed mobility   Impairments balance impaired;coordination impaired;endurance;pain;ROM decreased;strength decreased   Comment Sup>Sit, SBA.   Transfer Assessment/Treatment   Sit-Stand Independence stand-by assistance   Stand-Sit Independence stand-by assistance   Sit-Stand-Sit, Assist Device walker, front wheeled   Bed-Chair Independence contact guard assist   Chair-Bed Independence not tested   Bed-Chair-Bed Assist Device walker, front wheeled   Toilet Transfer Independence not tested   Transfer Safety Issues balance decreased during turns;step length decreased;weight-shifting ability decreased   Transfer Impairments balance impaired;coordination impaired;endurance;flexibility decreased;pain;ROM decreased;strength decreased   Transfer Comment STS and Stand>Sit, FWW, SBA. Bed>Chair, FWW, CGA.   Gait Assessment/Treatment   Total Distance Ambulated 124   Independence  contact  guard assist;1 person + 1 person to manage equipment   Assistive Device  walker, front wheeled   Distance in Feet 122ft total (40ftx2trials)   Gait Speed very slow   Deviations  cadence  decreased;step length decreased;weight-shifting ability decreased   Safety Issues  balance decreased during turns;step length decreased;weight-shifting ability decreased   Impairments  balance impaired;coordination impaired;endurance;flexibility decreased;pain;ROM decreased;strength decreased   Comment Gait, FWW, 1+1 chair follow, CGA required for 11ft total (80ftx2trials). Pt required extended seated rest break d/t fatigue, SOB.   Balance Skill Training   Sitting Balance: Static fair + balance   Sitting, Dynamic (Balance) fair balance   Sit-to-Stand Balance fair + balance   Standing Balance: Static fair balance   Standing Balance: Dynamic fair - balance   Systems Impairment Contributing to Balance Disturbance musculoskeletal   Identified Impairments Contributing to Balance Disturbance impaired coordination;pain;decreased ROM;decreased strength   Post Treatment Status   Post Treatment Patient Status Patient sitting in bedside chair or w/c;Call light within reach;Telephone within reach;Media planner Post Treatment Comment Pt performance.   Basic Mobility Am-PAC/6Clicks Score (APPROVED PT Staff, [facility] PT/OT, [facility] ONLY, [facility], [facility], and [facility])   Turning in bed without bedrails 4   Lying on back to sitting on edge of flat bed 4   Moving to and from a bed to a chair 3   Standing up from chair 4   Walk in room 3   Climbing 3-5 steps with railing 2   6 Clicks Raw Score total 20   Standardized (t-scale) score 43.99   CMS 0-100% Score 33.32   CMS Modifier CJ   Patient Mobility Goal (JHHLM) 7- Walk 25 feet or more 3X/day   Exercise/Activity Level Performed 7- Walked 25 feet or more   Physical Therapy Clinical Impression   Assessment Pt performed well. Gait, FWW, 1+1 chair follow, CGA required for 136ft total (30ftx2trials). Pt required extended seated rest break d/t fatigue, SOB. Cont to anticipate IRF upon d/c of stay.    Anticipated Equipment Needs at Discharge (PT) none anticipated   Anticipated Discharge Disposition inpatient rehabilitation facility       Therapist:   Rosilyn Mings, PTA   Pager #: [phone]

## 2021-06-07 NOTE — Care Plan (Signed)
[facility]  Rehabilitation Services  Occupational Therapy Progress Note    Patient Name: Misty Johnson  Date of Birth: 09-Aug-1950  Height:  162.6 cm (5\' 4" )  Weight:  76.2 kg (167 lb 15.9 oz)  Room/Bed: 904/A  Payor: MEDICARE / Plan: MEDICARE PART A AND B / Product Type: Medicare /     Assessment:    OT tx at b/s.  Improved function with bed mobility, transfers and household distance ambulation.  Pt is not yet functioning at a level conducive with return home even with family support and home health.  Will need rehab to facilitate safe transition home.      Discharge Needs:   Equipment Recommendation: to be determined    Discharge Disposition:  inpatient rehabilitation facility     JUSTIFICATION OF DISCHARGE RECOMMENDATION   Based on current diagnosis, functional performance prior to admission, and current functional performance, this patient requires continued OT services in inpatient rehabilitation facility in order to achieve significant functional improvements.    Plan:   Continue to follow patient according to established plan of care.  The risks/benefits of therapy have been discussed with the patient/caregiver and he/she is in agreement with the established plan of care.     Subjective & Objective:      06/07/21 1402   Therapist Pager   OT Assigned/ Pager # Archie Shea [phone]   Rehab Session   Document Type therapy progress note (daily note)   Total OT Minutes: 21   Patient Effort good   Symptoms Noted During/After Treatment shortness of breath;fatigue   General Information   Patient Profile Reviewed yes   Medical Lines PIV Line   Respiratory Status nasal cannula  (3L)   Existing Precautions/Restrictions fall precautions;full code;oxygen therapy device and L/min   Pre Treatment Status   Pre Treatment Patient Status Patient supine in bed;Call light within reach;Telephone within reach;Sitter select activated;Nurse approved session   Support Present Pre Treatment  None   Communication Pre Treatment   Nurse   Mutuality/Individual Preferences   Individualized Care Needs OOB with FWW and assist, encourage participation in adl routine seated at sink with assist   Vital Signs   O2 Delivery Pre Treatment supplemental O2   O2 Delivery Post Treatment supplemental O2   Pain Assessment   Pre/Posttreatment Pain Comment still notes R sided pain related to procedure, but not as significant as it was last week   Coping/Psychosocial   Observed Emotional State calm;cooperative   Verbalized Emotional State acceptance   Family/Support System   Involvement in Care not present at bedside   Coping/Psychosocial Response Interventions   Plan Of Care Reviewed With patient   Trust Relationship/Rapport care explained;choices provided;questions encouraged;reassurance provided;thoughts/feelings acknowledged   Cognitive Assessment/Interventions   Behavior/Mood Observations behavior appropriate to situation, WNL/WFL   Orientation Status oriented x 4   Attention WNL/WFL   Follows Commands WNL   Bed Mobility Assessment/Treatment   Bed Mobility, Assistive Device Head of Bed Elevated   Supine-Sit Independence stand-by assistance   Transfer Assessment/Treatment   Sit-Stand Independence stand-by assistance;contact guard assist   Stand-Sit Independence stand-by assistance;contact guard assist   Sit-Stand-Sit, Assist Device walker, front wheeled   Bed-Chair Independence contact guard assist   Bed-Chair-Bed Assist Device walker, front wheeled   Transfer Comment CG for sit to stand from lower surfaces without arm rests   Gait Assessment/Treatment   Independence  contact guard assist;1 person + 1 person to manage equipment   Assistive Device  walker, front wheeled  Distance in Feet approx 60' x 2 with seated rest   Gait Speed slow   Comment chair follow for safety   Upper Body Dressing Assessment/Training   Position  sitting   Independence Level  minimum assist (75% patient effort)   Comment gown as robe   Grooming Assessment/Training   Position  sitting   Independence Level set up required   Comment hand hygiene   Balance Skill Training   Sitting Balance: Static fair + balance   Sitting, Dynamic (Balance) fair balance   Sit-to-Stand Balance fair balance   Standing Balance: Static fair balance   Standing Balance: Dynamic fair - balance   Post Treatment Status   Post Treatment Patient Status Patient sitting in bedside chair or w/c;Call light within reach;Telephone within reach;Sitter select activated   Support Present Post Treatment  None   Clinical Impression   Functional Level at Time of Session OT tx at b/s.  Improved function with bed mobility, transfers and household distance ambulation.  Pt is not yet functioning at a level conducive with return home even with family support and home health.  Will need rehab to facilitate safe transition home.   Anticipated Equipment Needs at Discharge to be determined   Anticipated Discharge Disposition inpatient rehabilitation facility   Highest level of Mobility score   Exercise/Activity Level Performed 7- Walked 25 feet or more       Therapist:   Linard Millers, OT  Pager #: [phone]

## 2021-06-07 NOTE — Nurses Notes (Addendum)
Patient reporting slight sensation to void bladder but states she is unable to. Was up to [facility] for BM and was unsuccessful in voiding her bladder. Bladder scan performed, per bladder scan. Service notified. Stated to recheck bladder scan in a few hours. Will monitor.     1550; Bladder scanned second time this shift for . Notifiied service. Verbal order to straight cath.     1755; Straight cath inserted, of amber to tea color urine in bag. Patient stating lower abdomen pressure subsided after straight cath performed. Service notified.

## 2021-06-07 NOTE — Care Plan (Signed)
Patient is A/Ox4. Assessment and vital signs documented per flowsheet. Patient denies N/V/D and pain. Reports SOB, stating it is not worse than days prior. Remains on 3L NC. Currently does not feel urge to void, will monitor retention throughout shift. Medications per eMAR. PIV dressing is clean, dry and intact. HF, skin precautions maintained. Call bell within reach. Patient denies any additional needs at this time. Will continue to monitor.    Problem: Adult Inpatient Plan of Care  Goal: Plan of Care Review  Outcome: Ongoing (see interventions/notes)  Goal: Patient-Specific Goal (Individualized)  Outcome: Ongoing (see interventions/notes)  Flowsheets (Taken 06/07/2021 0847)  Individualized Care Needs: None stated, encouraged to voice.  Anxieties, Fears or Concerns: None stated, encouraged to voice.  Goal: Absence of Hospital-Acquired Illness or Injury  Outcome: Ongoing (see interventions/notes)  Intervention: Identify and Manage Fall Risk  Recent Flowsheet Documentation  Taken 06/07/2021 0847 by Moshe Salisbury, RN  Safety Promotion/Fall Prevention:   safety round/check completed   nonskid shoes/slippers when out of bed   fall prevention program maintained   activity supervised   motion sensor pad activated  Intervention: Prevent Skin Injury  Recent Flowsheet Documentation  Taken 06/07/2021 0847 by Moshe Salisbury, RN  Body Position: positioned/repositioned independently  Skin Protection:   adhesive use limited   transparent dressing maintained   tubing/devices free from skin contact  Intervention: Prevent and Manage VTE (Venous Thromboembolism) Risk  Recent Flowsheet Documentation  Taken 06/07/2021 0847 by Moshe Salisbury, RN  VTE Prevention/Management:   ambulation promoted   dorsiflexion/plantar flexion performed  Goal: Optimal Comfort and Wellbeing  Outcome: Ongoing (see interventions/notes)  Intervention: Provide Person-Centered Care  Recent Flowsheet Documentation  Taken 06/07/2021 0847 by Moshe Salisbury, RN  Trust  Relationship/Rapport:   care explained   choices provided   emotional support provided   empathic listening provided   questions answered   questions encouraged   reassurance provided   thoughts/feelings acknowledged  Goal: Rounds/Family Conference  Outcome: Ongoing (see interventions/notes)     Problem: Pain Acute  Goal: Optimal Pain Control and Function  Outcome: Ongoing (see interventions/notes)  Intervention: Prevent or Manage Pain  Recent Flowsheet Documentation  Taken 06/07/2021 0847 by Moshe Salisbury, RN  Sleep/Rest Enhancement: awakenings minimized  Intervention: Optimize Psychosocial Wellbeing  Recent Flowsheet Documentation  Taken 06/07/2021 0847 by Moshe Salisbury, RN  Diversional Activities: television     Problem: Oral Intake Inadequate  Goal: Improved Oral Intake  Outcome: Ongoing (see interventions/notes)     Problem: Fall Injury Risk  Goal: Absence of Fall and Fall-Related Injury  Outcome: Ongoing (see interventions/notes)  Intervention: Promote Injury-Free Environment  Recent Flowsheet Documentation  Taken 06/07/2021 0847 by Moshe Salisbury, RN  Safety Promotion/Fall Prevention:   safety round/check completed   nonskid shoes/slippers when out of bed   fall prevention program maintained   activity supervised   motion sensor pad activated     Problem: Skin Injury Risk Increased  Goal: Skin Health and Integrity  Outcome: Ongoing (see interventions/notes)  Intervention: Optimize Skin Protection  Recent Flowsheet Documentation  Taken 06/07/2021 0847 by Moshe Salisbury, RN  Pressure Reduction Techniques: frequent weight shift encouraged  Pressure Reduction Devices: (M,H,VH) Use Repositioning Devices or Pillows  Skin Protection:   adhesive use limited   transparent dressing maintained   tubing/devices free from skin contact  Activity Management: activity clustered for rest periods  Head of Bed [facility]) Positioning: HOB elevated

## 2021-06-08 DIAGNOSIS — K5900 Constipation, unspecified: Secondary | ICD-10-CM

## 2021-06-08 MED ORDER — SENNOSIDES 8.6 MG-DOCUSATE SODIUM 50 MG TABLET
2.0000 | ORAL_TABLET | Freq: Two times a day (BID) | ORAL | Status: DC
Start: 2021-06-08 — End: 2024-02-19

## 2021-06-08 MED ORDER — PREDNISONE 10 MG TABLET
ORAL_TABLET | ORAL | 0 refills | Status: DC
Start: 2021-06-08 — End: 2022-07-02

## 2021-06-08 MED ORDER — NYSTATIN 100,000 UNIT/ML ORAL SUSPENSION
5.0000 mL | Freq: Four times a day (QID) | ORAL | 0 refills | Status: AC
Start: 2021-06-08 — End: 2021-06-15

## 2021-06-08 MED ORDER — POLYETHYLENE GLYCOL 3350 17 GRAM ORAL POWDER PACKET: 34.0000 g | Freq: Two times a day (BID) | ORAL | Status: DC

## 2021-06-08 MED ORDER — IPRATROPIUM 0.5 MG-ALBUTEROL 3 MG (2.5 MG BASE)/3 ML NEBULIZATION SOLN: 3.0000 mL | INHALATION_SOLUTION | Freq: Four times a day (QID) | RESPIRATORY_TRACT | Status: DC

## 2021-06-08 NOTE — Progress Notes (Signed)
[facility]  Medicine Progress Note    Misty Johnson  Date of service: 06/08/2021  Date of Admission:  05/28/2021    Hospital Day:  LOS: 11 days       Assessment/ Plan:   Active Hospital Problems    Diagnosis   . Primary Problem: Mediastinal mass       Misty Johnson a 70 y.o.,[place] medical history of CAD, HFrEF, COPD and chronic hypoxic respiratory failure on 3L NC at home who presented to [facility] due to having SOB and hoarseness for the last few daysfound to have COPD exacerbation and mediastinal mass.    Chronic hypoxic respiratory failure 2/2 COPD - initially in acute COPD exacerbation, slight worsening overnight  - Duonebs schedule with albuterol PRN. Patient states he typically uses neb treatments at home multiple times a day  - Pulmonary toilet  - Symbicort BID  - completed Prednisone 40 mg daily for 5 days. Continue taper as patient did not tolerate sudden cessation of steroids. End 12/13. Taper in DC summary and AVS  - completed Doxycycline 40 mg daily for 5 days (end date 11/29)  - Continue with 3L NC with goal SpO2 88%+    Mediastinal mass and hoarseness  - thoracic surgery following,s/p R VATS robotic assisted anterior mediastinal mass resection, path pending at discharge. Follow up with [facility] at discharge (referral order placed)  - CT scan images are uploaded on [employer]  - LDH 229, HCG 10  - myasthenia panel pending although low suspicion  - PRN PO narcotics    Thrush - nystatin swish and swallow x 14 days. Continue at discharge    Acute urinary retention  - urine culture with candida glabarata  - bladder scan TID   - foley present from surgery. Foley removed 12/3.   - bladder scan and PRN straight cath orders placed for retention  - may require urology referral closer to home if continues to require straight caths    CAD  HFpEF  - Continue Lipitor  - ASA, OK per thoracics  - TTE with EF of 65%. Technically difficult study due to limited acoustic  windows.     Mood disorder  - Continue lexapro    GERD  - continue PPI    Body mass index is 28.84 kg/m.    Hardware (lines, foley's, tubes):  Patient Lines/Drains/Airways Status     Active Line / Dialysis Catheter / Dialysis Graft / Drain / Airway / Wound     Name Placement date Placement time Site Days    Peripheral IV Ultrasound guided Left Forearm 06/01/21  1030  -- 6    Peripheral IV Distal;Right Basilic  (medial side of arm) 06/01/21  1200  -- 6    Surgical Incision Right;Anterior;Lateral Chest 06/01/21  1115  -- 6                 DVT/PE Prophylaxis: lovenox    Disposition Planning: IPR today    Greater than 30 minutes was spent in the discharge process including patient education, med reconciliation, and transitions of care.      [name], MD  [facility]  Assistant Professor of Medicine  Pager [phone]    ____________________________________________________________________________________________________________________________________________________________________    Chief Complaint: mediastinal mass  Subjective: NAEO. Patient resting in bed. Reports BM yesterday. Still with difficulty urinating. Tolerating straight caths. Denied cp, worsening sob, nausea, vomiting.     Vital Signs:  Temp  Avg: 36.9 C (98.5 F)  Min: 36.4  C (97.5 F)  Max: 37.2 C (99 F)    Pulse  Avg: 60.2  Min: 55  Max: 67 BP  Min: 112/48  Max: 129/54   Resp  Avg: 17.3  Min: 16  Max: 20 SpO2  Avg: 95.7 %  Min: 92 %  Max: 97 %          Input/Output    Intake/Output Summary (Last 24 hours) at 06/08/2021 1008  Last data filed at 06/08/2021 0751  Gross per 24 hour   Intake 765 ml   Output 950 ml   Net -185 ml    I/O last shift:  12/06 0700 - 12/06 1859  In: 125 [P.O.:125]  Out: 0    acetaminophen (TYLENOL) tablet, 650 mg, Oral, Q4H PRN  albuterol (PROVENTIL) 2.5 mg / 3 mL (0.083%) neb solution, 2.5 mg, Nebulization, Q4H PRN  aspirin chewable tablet 81 mg, 81 mg, Oral, Daily  atorvastatin (LIPITOR) tablet, 40 mg, Oral,  QPM  budesonide-formoterol (SYMBICORT) 160 mcg-4.5 mcg per inhalation oral inhaler - "Nursing to administer", 2 Puff, Inhalation, 2x/day  calcium carbonate (TUMS) 500mg  (200mg  elemental calcium) chewable tablet, 500 mg, Oral, 3x/day PRN  D5W 250 mL flush bag, , Intravenous, Q15 Min PRN  D5W 250 mL flush bag, , Intravenous, Q15 Min PRN  enoxaparin PF (LOVENOX) 40 mg/0.4 mL SubQ injection, 40 mg, Subcutaneous, Daily  escitalopram (LEXAPRO) tablet, 20 mg, Oral, Daily  guaiFENesin 100mg  per 79mL oral liquid - for cough (expectorant), 200 mg, Oral, Q4H PRN  ipratropium-albuterol 0.5 mg-3 mg(2.5 mg base)/3 mL Solution for Nebulization, 3 mL, Nebulization, 4x/day  loratadine (CLARITIN) tablet, 10 mg, Oral, Daily  NS 250 mL flush bag, , Intravenous, Q15 Min PRN  NS 250 mL flush bag, , Intravenous, Q15 Min PRN  NS flush syringe, 2-6 mL, Intracatheter, Q8HRS  NS flush syringe, 2-6 mL, Intracatheter, Q1 MIN PRN  NS flush syringe, 2-6 mL, Intracatheter, Q8HRS  NS flush syringe, 2-6 mL, Intracatheter, Q1 MIN PRN  nystatin (MYCOSTATIN) 100,000 units per mL oral liquid, 5 mL, Swish & Swallow, 4x/day  ondansetron (ZOFRAN) 2 mg/mL injection, 4 mg, Intravenous, Q6H PRN  oxyCODONE (ROXICODONE) immediate release tablet, 5 mg, Oral, Q4H PRN   Or  oxyCODONE (ROXICODONE) immediate release tablet, 10 mg, Oral, Q4H PRN  pantoprazole (PROTONIX) delayed release tablet, 20 mg, Oral, Daily before Breakfast  polyethylene glycol (MIRALAX) oral packet, 34 g, Oral, 2x/day  predniSONE (DELTASONE) tablet 30 mg, 30 mg, Oral, Daily with Breakfast   Followed by  ON 06/10/2021] predniSONE (DELTASONE) tablet, 20 mg, Oral, Daily with Breakfast   Followed by  4m ON 06/13/2021] predniSONE (DELTASONE) tablet, 10 mg, Oral, Daily with Breakfast  sennosides-docusate sodium (SENOKOT-S) 8.6-50mg  per tablet, 2 Tablet, Oral, 2x/day          Physical Exam  BP (!) 125/42 Comment: RN Notified  Pulse 55   Temp 37.2 C (99 F)   Resp 16   Ht 1.626 m (5\' 4" )    Wt 76.2 kg (167 lb 15.9 oz)   SpO2 97%   BMI 28.84 kg/m     Constitutional: AOx 3, NAD, appears age appropriate  Eyes: Conjunctiva clear, Sclera non-icteric.   ENT: ENMT without erythema or injection, mucous membranes moist.  Neck: supple, symmetrical, trachea midline  Respiratory: ctab, diminished no rales. Occasional wheeze  Cardiovascular: RRR S1, S2 normal, no murmur, click, rub or gallop  Gastrointestinal: soft, NT, Bowel sounds normal, non-distended  Genitourinary: Deferred  Musculoskeletal: Head atraumatic and normocephalic. R  sided chest tube. traceedema in BLE  Integumentary:  Skin warm and dry, No rashes and No lesions  Neurologic: normal strength and tone. No tremor  Psychiatric: Normal affect, behavior    Labs:    CBC  Diff   Lab Results   Component Value Date/Time    WBC 6.2 06/07/2021 06:23 AM    HGB 9.9 (L) 06/07/2021 06:23 AM    HCT 32.6 (L) 06/07/2021 06:23 AM    PLTCNT 150 06/07/2021 06:23 AM    RBC 3.91 06/07/2021 06:23 AM    MCV 83.4 06/07/2021 06:23 AM    MCHC 30.4 (L) 06/07/2021 06:23 AM    MCH 25.3 (L) 06/07/2021 06:23 AM    MPV 11.4 06/07/2021 06:23 AM    Lab Results   Component Value Date/Time    PMNS 77 06/07/2021 06:23 AM    MONOCYTES 10 06/07/2021 06:23 AM    BASOPHILS 0 06/07/2021 06:23 AM    BASOPHILS <0.10 06/07/2021 06:23 AM    PMNABS 4.72 06/07/2021 06:23 AM    LYMPHSABS 0.76 (L) 06/07/2021 06:23 AM    EOSABS <0.10 06/07/2021 06:23 AM    MONOSABS 0.61 06/07/2021 06:23 AM          Comprehensive Metabolic Profile    Lab Results   Component Value Date/Time    SODIUM 140 06/07/2021 06:23 AM    POTASSIUM 4.2 06/07/2021 06:23 AM    CHLORIDE 102 06/07/2021 06:23 AM    CO2 29 06/07/2021 06:23 AM    ANIONGAP 9 06/07/2021 06:23 AM    BUN 17 06/07/2021 06:23 AM    CREATININE 0.60 06/07/2021 06:23 AM    GLUCOSE Negative 05/29/2021 07:53 PM    Lab Results   Component Value Date/Time    CALCIUM 8.6 (L) 06/07/2021 06:23 AM    PHOSPHORUS 2.7 06/03/2021 06:52 AM    ALBUMIN 3.4 05/28/2021 07:10  AM    TOTALPROTEIN 6.3 05/28/2021 07:10 AM    ALKPHOS 73 05/28/2021 07:10 AM    AST 20 05/28/2021 07:10 AM    ALT 18 05/28/2021 07:10 AM    TOTBILIRUBIN 0.9 05/28/2021 07:10 AM        CARDIAC MARKERS  Lab Results   Component Value Date    TROPONINI 18 05/28/2021            Radiology:   Last CXR Impression   XR AP MOBILE CHEST (Exam End: 06/04/2021  7:00 AM)    Impression    Chest tube removal otherwise unchanged   XR CHEST AP AND LATERAL (Exam End: 05/28/2021  8:51 AM)    Impression    Mild pulmonary edema pattern.       Results for orders placed during the hospital encounter of 05/28/21    TRANSTHORACIC ECHOCARDIOGRAM - ADULT 05/28/2021 12:14 PM    Narrative  **See full report in linked PDF document**    Transthoracic Echocardiographic Report    ______________________________________________________________________________  Name: ARMENTA, DOW                         MRN: [id number]               Weight: 168 lb  Study Date: 05/28/2021 08:14 AM              DOB: 1950/10/06             Height: 64 in  Gender: Female  Age: 15 yrs                 BSA: 1.8 m2  Accession #: [id number]                    BP: 138/82 mmHg  Patient Location: [facility]  Ordering Provider: Kathrine Cords  TechFN:[id number]    ______________________________________________________________________________  Procedure:  Transthoracic complete echo with contrast, 2D, spectral and tissue Doppler, color flow Doppler, M-mode.    Quality:  Technically difficult study due to limited acoustic windows.    Indications: Dyspnea,Respiratory failure (CMS HCC),Heart failure (CMS HCC),CAD (coronary artery disease)    Conclusions:  Left Ventricle: Normal left ventricular size. The left ventricular ejection fraction by visual assessment is estimated to be 65%. Regional wall motion abnormalities cannot be excluded due to limited  visualization. Left ventricular diastolic function is indeterminate.  Right Ventricle: Normal right ventricular  size. Normal right ventricular systolic function.  Left Atrium: The left atrium is normal in size.  Right Atrium: The right atrium is of normal size.    Findings  Left Ventricle:   Normal left ventricular size. The left ventricular ejection fraction by visual assessment is estimated to be 65%. Left ventricular systolic function is normal. Regional wall motion  abnormalities cannot be excluded due to limited visualization. Left ventricular diastolic function is indeterminate.  Right Ventricle:   Normal right ventricular size. Normal right ventricular systolic function. Right ventricular systolic pressure is indeterminate.  Left Atrium:   The left atrium is normal in size.  Right Atrium:   The right atrium is of normal size.  Mitral Valve:   The mitral valve is not well visualized. No significant mitral regurgitation present.  Tricuspid Valve:   The tricuspid valve is not well visualized. No significant tricuspid regurgitation present.  Aortic Valve:   The aortic valve is not well visualized. No Aortic valve stenosis. No significant aortic regurgitation present.  Pulmonic Valve:   The pulmonic valve is not well visualized.  Pulmonary Artery:   The pulmonary artery is not well visualized.  Atrial Septum:   The interatrial septum is not well visualized.  IVC/Hepatic Veins:   The inferior vena cava was not visualized.  Aorta:   The aortic root is of normal size.  Pericardium/Pleural space:   Normal pericardium with no pericardial effusion.    Electronically signed by: MD Sammuel Cooper on 05/28/2021 12:14 PM

## 2021-06-08 NOTE — Discharge Summary (Signed)
[facility]  DISCHARGE SUMMARY    PATIENT NAME:  Misty Johnson, Misty Johnson  MRN:  [id number]  DOB:  1951/04/14    ENCOUNTER DATE:  05/28/2021  INPATIENT ADMISSION DATE: 05/28/2021  DISCHARGE DATE:  06/08/2021    ATTENDING PHYSICIAN: [name], MD  SERVICE: HOSPITALIST 4  PRIMARY CARE PHYSICIAN: [name], FNP-C     Has PCP been verified with patient and updated? Yes    LAY CAREGIVER:  ,  ,        PRIMARY DISCHARGE DIAGNOSIS: Mediastinal mass  Active Hospital Problems    Diagnosis Date Noted   . Principle Problem: Mediastinal mass [J98.59] 05/28/2021      Resolved Hospital Problems   No resolved problems to display.     There are no active non-hospital problems to display for this patient.       DISCHARGE MEDICATIONS:     Current Discharge Medication List      START taking these medications.      Details   ipratropium-albuteroL 0.5 mg-3 mg(2.5 mg base)/3 mL nebulizer solution  Commonly known as: DUONEB   3 mL, Nebulization, 4 TIMES DAILY  Refills: 0     nystatin 100,000 unit/mL Suspension  Commonly known as: MYCOSTATIN   5 mL, Swish & Swallow, 4 TIMES DAILY  Qty: 125 mL  Refills: 0     polyethylene glycol 17 gram Powder in Packet  Commonly known as: MIRALAX   34 g, Oral, 2 TIMES DAILY  Refills: 0     predniSONE 10 mg Tablet  Commonly known as: DELTASONE   Take 30mg  daily on 12/7 then 20mg  daily for 3 days then 10mg  daily for 3 days. End 12/13  Qty: 12 Tablet  Refills: 0     sennosides-docusate sodium 8.6-50 mg Tablet  Commonly known as: SENOKOT-S   2 Tablets, Oral, 2 TIMES DAILY  Refills: 0        CONTINUE these medications - NO CHANGES were made during your visit.      Details   albuterol sulfate 2.5 mg /3 mL (0.083 %) nebulizer solution  Commonly known as: PROVENTIL   2.5 mg, Nebulization, DAILY PRN  Refills: 0     aspirin 81 mg Tablet, Chewable   81 mg, Oral, DAILY  Refills: 0     atorvastatin 40 mg Tablet  Commonly known as: LIPITOR   40 mg, Oral, EVERY EVENING  Refills: 0     BREZTRI AEROSPHERE INHL    Inhalation, DAILY  Refills: 0     ergocalciferol (vitamin D2) 1,250 mcg (50,000 unit) Capsule  Commonly known as: DRISDOL   50,000 Units, Oral, EVERY 7 DAYS  Refills: 0     escitalopram oxalate 20 mg Tablet  Commonly known as: LEXAPRO   20 mg, Oral, DAILY  Refills: 0     furosemide 40 mg Tablet  Commonly known as: LASIX   40 mg, Oral, DAILY  Refills: 0     Levocetirizine 5 mg Tablet  Commonly known as: XYZAL   5 mg, Oral, EVERY EVENING  Refills: 0     omeprazole 20 mg Capsule, Delayed Release(E.C.)  Commonly known as: PRILOSEC   20 mg, Oral, DAILY  Refills: 0     OXYGEN-AIR DELIVERY SYSTEMS DEACT   Does not apply, 3L NC at home  Refills: 0     potassium chloride 20 mEq Tab Sust.Rel. Particle/Crystal  Commonly known as: K-DUR   20 mEq, Oral, DAILY  Refills: 0  Discharge med list refreshed?  YES     During this hospitalization did the patient have an AMI, PCI/PCTA, STENT or Isolated CABG?  No                            ALLERGIES:  No Known Allergies          HOSPITAL PROCEDURE(S):   Bedside Procedures:  No orders of the defined types were placed in this encounter.    Surgical   Surgical/Procedural Cases on this Admission     Case IDs Date Procedure Surgeon Location Status    [phone] 06/01/21 ROBOTIC EXCISION MASS MEDIASTINAL Toker, Nile Dear, MD [place] [facility] OR/PROC [place] COURSE     BRIEF HPI:  This is a 70 y.o., female admitted for SOB and hoarseness    BRIEF HOSPITAL NARRATIVE:      Acute on chronic hypoxic respiratory failure 2/2 acute COPD exacerbation - patient initially presented with acute resp failure 2/2 COPD exacerbation. Treated with steroids, nebs, and completed 5 days doxycycline. Had some worsening wheezing after stopping prednisone and was placed back on a prednisone taper. See below for pred taper. Patient was also placed on scheduled duonebs as she states she took nebs frequently at home at baseline. She will need to continue her home inhaler and  would benefit from following up or establishing with a local pulmonologist.    Mediastinal mass and hoarseness - patient found to have a mediastinal mass on admission. T surg performed VATS with robotic assisted anterior mediastinal mass resection. Path still pending at discharge. Referred to follow up with T surg in 2 weeks after discharge. They should call to make an appointment.    Acute urinary retention - patient with urinary retention issues after surgery. Foley removed 12/3 however still required intermittent straight caths. Patient able to sense when her bladder is full but difficulties emptying it. Please continue with intermittent straight caths at facility and consider urology referral if condition does not improve. Has not required narcotics since 12/2.     Constipation - some issues with constipation prior to discharge. Last BM 12/5. Will need to continue laxatives. Patient declined suppositories/enemas.     Pred taper:  30mg  D - 12/7  20mg  D - 12/8 - 12/10  10mg  D - 12/11 - 12/13       TRANSITION/POST DISCHARGE CARE/PENDING TESTS/REFERRALS:     - Continue pred taper as above  - Follow up with T surg  - Consider urology referral if urinary retention does not continue to improve        CONDITION ON DISCHARGE:  A. Ambulation: Ambulation with assistive device  B. Self-care Ability: With partial assistance  C. Cognitive Status Alert and Oriented x 3  D. Code status at discharge:             LINES/DRAINS/WOUNDS AT DISCHARGE:   Patient Lines/Drains/Airways Status     Active Line / Dialysis Catheter / Dialysis Graft / Drain / Airway / Wound     Name Placement date Placement time Site Days    Peripheral IV Ultrasound guided Left Forearm 06/01/21  1030  -- 6    Peripheral IV Distal;Right Basilic  (medial side of arm) 06/01/21  1200  -- 6    Surgical Incision Right;Anterior;Lateral Chest 06/01/21  1115  -- 6  DISCHARGE DISPOSITION:  [facility]              DISCHARGE INSTRUCTIONS:  Post-Discharge  Follow Up Appointments     Follow up with [place], [facility]    Phone: [phone]    Where: [address], [place] [address]    Tuesday Jun 15, 2021    X-Ray Exam with Kathe Mariner at  2:00 PM    Return Patient visit with Myra Gianotti, MD at  2:45 PM    Imaging Services, [facility]  [facility], [place] [address]  [phone] Thoracic Oncology, [place]  [facility], [place] [address]  [phone]           XR CHEST PA AND LATERAL    Please schedule CXR same day as follow up appt.           DISCHARGE INSTRUCTION - ACTIVITY    We suggest a cautious, yet progressive plan to safely regain your normal body and life functions. In time, you should be able to do your regular routine tasks, return to work, and take part in recreational activities. No lifting over 10 lbs for the first 4 weeks. Here are a few tips:  Get up and get dressed each morning. Please do not stay in bed, wear comfortable clothes each day, break up long tasks into shorter parts, and space them over the day, stop your tasks before you get tired, if you do too much, you will likely be tired the next day and need to rest, which will slow recovery, balance your activity with rest times. Your body may give you signals to rest, these include symptoms such as shortness of breath, fatigue, dizziness, and pain, rest when possible or needed. If you need to climb stairs, we suggest going slowly at first. Always remember that it takes more energy to climb a flight of steps then to walk on a level surface. If you start to become tired or have shortness   of breath, stop, rest for a few minutes, and then continue. You should only use a railing for your balance, please do not pull yourself up the stairs as the strain can damage your incision and put  excess pressure on your body.     Activity: NO LIFTING OVER 10 POUNDS FOR 4 WEEKS      DISCHARGE INSTRUCTION - DRIVING    You will not be cleared to safely drive a car untilafter your visit with your surgeon, this visit is often scheduled two-four weeks after your surgery. No driving while taking Narcotic (pain) medications. If you were to be involved in a car accident, you would hurt your surgical site or other areas that have undergone surgical procedures. When you are a passenger in a car, we suggest you ride in the back seat, if possible. If you are riding in the front seat, move as far back as possible to increase leg room, and use a pillow between your chest and the seat belt for added comfort and to avoid any irritation on your incision. We advise against taking long trips without a doctors approval, and when approved be sure to allow ample time for stops to walk and stretch your legs and arms.     DISCHARGE INSTRUCTION - INCISION/WOUND CARE    Please follow these steps to  care for your incision(s): breathe in through your nose as you raise your arms during activity, breathe out through your mouth as you lower your arms. Never hold your breath, you may raise your arms over your head to brush or shampoo your hair. Be careful when reaching. The incision and the muscles around it may be very sore, do not lift anything heavier than 10 pounds for 4 weeks after surgery,  you should not push or pull with your arms, especially when rising from a bed, assistive devices, such as canes or walkers, can be used only for balance. Do not place your full weight on any of these devices until the incision is fully healed, it is very important to keep your incisions clean and dry. Follow these guidelines: shower daily, pat dry. Do not take a tub bath for 4 weeks or until your surgeon says you may. Wash your incisions with an antimicrobial soap and water, pat dry. Always use a clean cloth. Do not put any creams, lotions, or  antibiotic   ointments on the incisions. Keep your legs raised when sitting for more than 15 minutes, and remember to stretch. Do not wear any tight clothing that may rub against your incisions, causing irritation.     Instructions for incision/wound care: Keep Incision Clean and Dry      DISCHARGE INSTRUCTION - RESPIRATORY CARE    You should continue to use the incentive spirometer tool provided to you by the hospital at least four times a day (10 repetitions eachtime for one week). It is important to keep your airways free of mucus buildup at all times.     DISCHARGE INSTRUCTION - WHEN TO CONTACT THE SURGEON'S OFFICE AT [phone]    - Increased warmth in the skin around an incision  - Redness that spreads out more than one inch from incision edges  - Increase of swelling, tightness, or pain around an incision  - Large amount of clear or pinkish drainage  - Sudden increase in the amount of drainage  - White, yellow, or green drainage with odor coming from an incision  - Fever higher than 101 F (38.3 C)  - Chills or temperature of 99 F (37.2 C) to 100.9 F (38.27 C) for more than 3 days.  - Continued or severe sadness  - Lightheadedness/ dizziness  - Increased shortness of breath  - Burning whenpassing urine  - Severe calf pain  - New severe pain in your chest  - Heart rate greater than 110 beats per minute or less than 50 beats per minute    - You are also encouraged to call the surgeons office at any time, in the event you have questions and/or concerns during your recovery process.     DISCHARGE INSTRUCTION - RESUME HOME DIET     Diet: RESUME HOME DIET      DISCHARGE INSTRUCTION - MISC    Discharge Instructions:    - Continue prednisone taper as prescribed. Take 30mg  x 1 on 12/7 then 20mg  daily for 3 days then 10mg  daily for 3 days. End 12/13.   - Continue home inhalers with scheduled nebulizers as you state you were taking this at home prior.  - Follow up with thoracic surgery in 2 weeks. They will call to  arrange. They will follow up pathology results of mediastinal mass. Results were pending at time of discharge.     SCHEDULE FOLLOW-UP [facility]  Follow-up in: 2 WEEKS    Reason for visit: HOSPITAL DISCHARGE    Follow-up reason: s/p vats mediastinal mass    Provider Toker, Nile Dear                 [name], MD    Copies sent to Care Team       Relationship Specialty Notifications Start End    [name], FNP-C PCP - General NURSE PRACTITIONER  05/27/21     Phone: [phone]          [place] [address]            Referring providers can utilize [email or web address] to access their referred [place] patient's information.

## 2021-06-08 NOTE — Discharge Instructions (Signed)
Discharge Recommendations/ Plan:Discharge OL:[facility]L Rehab Placement/Return (not psych) (code 22)      Resources: NURSING PLEASE CALL REPORT TO [facility] AT  [phone]

## 2021-06-08 NOTE — Care Management Notes (Addendum)
[facility]  Care Management Note    Patient Name: Misty Johnson  Date of Birth: 1950-12-12  Sex: female  Date/Time of Admission: 05/28/2021  4:57 AM  Room/Bed: 904/A  Payor: MEDICARE / Plan: MEDICARE PART A AND B / Product Type: Medicare /    LOS: 11 days   Primary Care Providers:  Ysidro Evert, FNP-C, FNP-C (General)    Admitting Diagnosis:  Mediastinal mass [J98.59]    Assessment:      06/08/21 0900   Assessment Details   Assessment Type Continued Assessment   Date of Care Management Update 06/08/21   Date of Next DCP Update 06/11/21   Medicare Intent to Discharge Documentation   Reason IMM "not applicable": Patient Transferred to Inpatient Rehab Facility (IRF)   Care Management Plan   Discharge Planning Status plan in progress   Projected Discharge Date 06/08/21   Discharge plan discussed with: Patient;Other (Comment)  (daughter)   Patient choice offered to patient/family yes   Form for patient choice reviewed/signed and on chart yes   Facility or Agency Preferences [facility]   Discharge Needs Assessment   Discharge Facility/Level of Care Needs Acute Rehab Placement/Return (not psych)(code 52)   Transportation Available family or friend will provide;car       Discharge Plan:  Acute Rehab Placement/Return (not psych) (code 50)  Per service, pt remains medically stable for d/c today. Spoke with Roxanne from [facility] [phone]), they are still expecting pt today. Spoke with pt daughter Misty Johnson [phone] to update. Misty Johnson on her way to hospital to provide transport for pt. Sent negative COVID and MAR via [employer] per Roxanne's request, will send d/c summary once available. Task sent requesting d/c packet for pt going to facility via private vehicle. Number for report in AVS and bedside RN updated.     1113  D/c summary sent via [employer].  The patient will continue to be evaluated for developing discharge needs.     Case Manager: Jannett Celestine, SOCIAL WORKER  Phone:  [phone]

## 2021-06-08 NOTE — Nurses Notes (Signed)
Discharge complete. Daughter with patient at bedside. Packet provided to daughter and educated to give the packet to [facility] when she gets there. 2 PIVs removed, catheters intact. Patient wearing home oxygen. No questions or concerns from patient or patient's daughter. Left in stable condition to lobby via wheelchair. Daughter will provide transportation to [facility]

## 2021-06-09 DIAGNOSIS — E328 Other diseases of thymus: Secondary | ICD-10-CM

## 2021-06-09 LAB — SURGICAL PATHOLOGY SPECIMEN

## 2021-06-09 NOTE — Transitional Care (Signed)
Hospital discharge follow up deferred as patient went to a facility.  Charolette Forward, LPN  40/09/7094, 09:01

## 2021-06-09 NOTE — Care Management Notes (Signed)
Referral Information  ++++++ Placed Provider #1 ++++++  Case Manager: Taylor Carroll  Provider Type: Acute -Rehab  Provider Name: [facility]  Address:  [address]  [place], [place] [id number]  Contact: Nancy Cooper    Phone: [phone] x  Fax:   Fax: [phone]

## 2021-06-10 LAB — MYASTHENIA GRAVIS PANEL 3
ACETYLCHOLINE REC BIND AB: <0.3 nmol/L
ACETYLCHOLINE REC BLOC AB  - EAST: <15 (ref <15)
ACETYLCHOLINE REC MOD AB: 3
STRIATED MUSCLE AB SCREEN: NEGATIVE

## 2021-06-14 NOTE — Anesthesia Postprocedure Evaluation (Signed)
Anesthesia Post Op Evaluation    Patient: Misty Johnson  Procedure(s):  ROBOTIC EXCISION MASS MEDIASTINAL    Last Vitals:Temperature: 36.7 C (98.1 F) (06/08/21 1141)  Heart Rate: 56 (06/08/21 1141)  BP (Non-Invasive): (!) 112/43 (06/08/21 1141)  Respiratory Rate: 16 (06/08/21 1141)  SpO2: 95 % (06/08/21 1141)    No notable events documented.    Patient is sufficiently recovered from the effects of anesthesia to participate in the evaluation and has returned to their pre-procedure level.  Patient location during evaluation: PACU       Patient participation: complete - patient participated  Level of consciousness: awake and alert and responsive to verbal stimuli    Pain management: adequate  Airway patency: patent    Anesthetic complications: no  Cardiovascular status: acceptable  Respiratory status: acceptable  Hydration status: acceptable  Patient post-procedure temperature: Pt Normothermic   PONV Status: Absent

## 2021-06-15 ENCOUNTER — Encounter (HOSPITAL_COMMUNITY): Payer: Self-pay

## 2021-06-15 ENCOUNTER — Ambulatory Visit (HOSPITAL_BASED_OUTPATIENT_CLINIC_OR_DEPARTMENT_OTHER): Payer: Medicare Other | Admitting: Thoracic Surgery (Cardiothoracic Vascular Surgery)

## 2021-06-15 NOTE — Progress Notes (Incomplete)
[facility], [place]  [place] [address]  [phone]    Thoracic Surgery  New Patient Clinic Note    Name: Misty Johnson   DOB: Feb 25, 1951  [70 y.o. female]   MRN: [id number]       Visit Date: 06/15/2021   Referring: [name], FNP-C  [address]  [place],  [place] [id number]   PCP: [name], FNP-C           Reason For Visit: mediastinal mass s/p VATS robotic excision    History of Present Illness:  Misty Johnson is a 70 y.o. White female who presents with a mediastinal mass s/p VATS with robotic assisted anterior mediastinal mass resection on 06/01/21 during admission for acute on chronic respiratory failure 2/2 acute COPD exacerbation. He was discharged on 06/08/21 who presents for follow up. He has a history of CAD, HFrEF, COPD on 3L NC at home.    Today, he reports***    Current Outpatient Medications:   Current Outpatient Medications   Medication Sig Dispense Refill   . albuterol sulfate (PROVENTIL) 2.5 mg /3 mL (0.083 %) Inhalation nebulizer solution Take 3 mL (2.5 mg total) by nebulization Once per day as needed for Wheezing     . aspirin 81 mg Oral Tablet, Chewable Chew 1 Tablet (81 mg total) Once a day     . atorvastatin (LIPITOR) 40 mg Oral Tablet Take 1 Tablet (40 mg total) by mouth Every evening     . budesonide/glycopyr/formoterol (BREZTRI AEROSPHERE INHL) Take by inhalation Once a day     . ergocalciferol, vitamin D2, (DRISDOL) 1,250 mcg (50,000 unit) Oral Capsule Take 1 Capsule (50,000 Units total) by mouth Every 7 days     . escitalopram oxalate (LEXAPRO) 20 mg Oral Tablet Take 1 Tablet (20 mg total) by mouth Once a day     . furosemide (LASIX) 40 mg Oral Tablet Take 1 Tablet (40 mg total) by mouth Once a day     . ipratropium-albuterol 0.5 mg-3 mg(2.5 mg base)/3 mL Solution for Nebulization Take 3 mL by nebulization Four times a day     . Levocetirizine (XYZAL) 5 mg Oral Tablet Take 1 Tablet (5 mg total) by mouth Every evening     .  nystatin (MYCOSTATIN) 100,000 unit/mL Oral Suspension Swish and Swallow 5 mL Four times a day for 25 doses 125 mL 0   . omeprazole (PRILOSEC) 20 mg Oral Capsule, Delayed Release(E.C.) Take 1 Capsule (20 mg total) by mouth Once a day     . OXYGEN-AIR DELIVERY SYSTEMS DEACT 3L NC at home     . polyethylene glycol (MIRALAX) 17 gram Oral Powder in Packet Take 2 Packets (34 g total) by mouth Twice daily     . potassium chloride (K-DUR) 20 mEq Oral Tab Sust.Rel. Particle/Crystal Take 1 Tablet (20 mEq total) by mouth Once a day     . predniSONE (DELTASONE) 10 mg Oral Tablet Take 30mg  daily on 12/7 then 20mg  daily for 3 days then 10mg  daily for 3 days. End 12/13 12 Tablet 0   . sennosides-docusate sodium (SENOKOT-S) 8.6-50 mg Oral Tablet Take 2 Tablets by mouth Twice daily       No current facility-administered medications for this visit.       Allergies:  No Known Allergies    History:   Past Medical History:   Diagnosis Date   . CAD (coronary artery disease)    . Chronic hypoxemic respiratory  failure (CMS HCC)     3L NC at home   . Compression fracture of T8 vertebra (CMS HCC)    . Congestive heart failure (CMS HCC)    . COPD (chronic obstructive pulmonary disease) (CMS HCC)    . COPD (chronic obstructive pulmonary disease) (CMS HCC)    . 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 Enlarged heart    . LVH (left ventricular hypertrophy)    . Major depression            Past Surgical History:   Procedure Laterality Date   . NO PAST SURGERIES             Social History     Socioeconomic History   . Marital status: Widowed   Tobacco Use   . Smoking status: Former     Types: Cigarettes   . Smokeless tobacco: Never   Vaping Use   . Vaping Use: Never used   Substance and Sexual Activity   . Alcohol use: Not Currently   . Drug use: Not Currently   . Sexual activity: Not Currently     Family Medical History:     Problem Relation (Age of Onset)    No Known Problems Mother, Father            Review of Systems:   Other than ROS in  the HPI, all other systems were negative.    There were no vitals taken for this visit.        Wt Readings from Last 3 Encounters:   05/28/21 76.2 kg (167 lb 15.9 oz)     There is no height or weight on file to calculate BMI.    Laboratory:  I have reviewed all lab values and culture results. Pertinent results are as below:  No components found for: CBC  No components found for: CMP  BNP   Date Value Ref Range Status   05/28/2021 297 (H) <=100 pg/mL Final     Comment:     Typical clinically relevant cutoffs for BNP are:              <100 pg/mL - Heart failure unlikely in most patients.   100-400 pg/mL - Heart failure is possible, clinical interpretation required.   >400 pg/mL - Heart failure is likely in appropriate clinical setting.    BNP should not be monitored in patients requiring intensive dialysis.   Age and sex specific cutoffs are not recommended in recent guidelines.   Cutoffs may require adjustment for obese patients. Reference: Mant et al. Lelon Frohlich Int Med [phone]      INR   Date Value Ref Range Status   05/28/2021 0.88 0.80 - 1.20 Final       Exam:      Constitutional: {GENERAL :20860}  Eyes: [id number]  ENT: [id number]  Neck: {Neck:20861}  Respiratory: {Respiratory:20862}  Cardiovascular: {Cardiovascular:20864}  Gastrointestinal: {Gastrointestinal:20865}  Genitourinary: {Genito-Urinary MF-DRE:20714}  Musculoskeletal: {Musculoskeletal:27345}  Integumentary:  {Integumentary:20867}  Neurologic: {Neurologic:20868}  Lymphatic/Immunologic/Hematologic: {Lymphatics:20869}  Psychiatric: {Psychiatric:20870}  ***      Radiology:   XR Chest PA and Lateral 06/15/21***    XR AP Mobile Chest 06/04/21  FINDINGS: Unchanged heart silhouette upper limits of normal in size and enhanced by poor inspiratory effort. Prominent mediastinum. Bilateral small pleural effusions. Lungs are clear. Removal of right-sided chest tube  IMPRESSION:  Chest tube removal otherwise unchanged    XR Chest AP and Lateral  05/28/21  FINDINGS:  The heart is  enlarged. Mild central vascular congestion and interstitial prominence. Bibasilar atelectasis. No focal consolidation, pleural effusion, or pneumothorax.  IMPRESSION:  Mild pulmonary edema pattern.    Microbiology: none    Pathology:  Surgical Path 06/01/21  Final Diagnosis  A. Mediastinal Cyst, Excision:  - Benign thymic cyst.  B. Mediastinal Mass, Excision:  - Benign fibroadipose tissue with focal benign mesothelial lining.    ASSESSMENT:  ***    Current Comorbid Conditions and Risk Factors   Family History of anesthesia complications? No  Previous Cardiothoracic Surgery? Yes, and the Surgery was: VATS with robotic assisted anterior mediastinal mass resection on 06/01/21  Cardiac History: Coronary Artery Disease & CHF  Cerebrovascular Disease: No  Respiratory & Infectious Disease: Acute Respiratory Failure  Pulmonary Hypertension: No  Renal Disease: NA  Unintended Weight Change within the last 3 months: none  Previous or Current Chemotherapy? No Cancer.  Previous or Current Radiation? No  FEV1 ordered? No, Reason: NA  Functional Status: {Dependency:210240108}***  ECOG Score: {findings; ecog performance status:31780}***  Fatigue/Debility: {[place] IP Debility Acute Chronic weakness:28488} ***    Patient has decision making capacity:  {YES NO COMMENTS:21021329} ***  Advance Directive:  {ADVANCE DIRECTIVES:50021890}***    PLAN:   ***    I independently of the faculty provider spent a total of (***) minutes in direct/indirect care of this patient including initial evaluation, review of laboratory, radiology, diagnostic studies, review of medical record, order entry and coordination of care.    I am scribing for, and in the presence of, Dr. Doree Barthel*** for services provided on 06/15/2021.  Dimple Wake Conlee, SCRIBE     *TGSCRIBE*

## 2021-06-29 ENCOUNTER — Ambulatory Visit (HOSPITAL_BASED_OUTPATIENT_CLINIC_OR_DEPARTMENT_OTHER): Payer: Medicare Other | Admitting: Thoracic Surgery (Cardiothoracic Vascular Surgery)

## 2021-06-29 ENCOUNTER — Ambulatory Visit (HOSPITAL_COMMUNITY): Payer: Self-pay

## 2021-07-27 ENCOUNTER — Ambulatory Visit (HOSPITAL_BASED_OUTPATIENT_CLINIC_OR_DEPARTMENT_OTHER): Payer: Medicare Other | Admitting: Thoracic Surgery (Cardiothoracic Vascular Surgery)

## 2021-08-10 ENCOUNTER — Ambulatory Visit (HOSPITAL_BASED_OUTPATIENT_CLINIC_OR_DEPARTMENT_OTHER): Payer: Medicare Other | Admitting: Thoracic Surgery (Cardiothoracic Vascular Surgery)

## 2021-12-17 ENCOUNTER — Encounter (HOSPITAL_BASED_OUTPATIENT_CLINIC_OR_DEPARTMENT_OTHER): Payer: Self-pay

## 2021-12-17 ENCOUNTER — Emergency Department
Admission: EM | Admit: 2021-12-17 | Discharge: 2021-12-17 | Disposition: A | Discharge status: Home / Self Care | Payer: 59 | Attending: Emergency Medicine | Admitting: Emergency Medicine

## 2021-12-17 ENCOUNTER — Other Ambulatory Visit: Payer: Self-pay

## 2021-12-17 ENCOUNTER — Emergency Department (HOSPITAL_BASED_OUTPATIENT_CLINIC_OR_DEPARTMENT_OTHER): Payer: 59

## 2021-12-17 DIAGNOSIS — Z9981 Dependence on supplemental oxygen: Secondary | ICD-10-CM | POA: Insufficient documentation

## 2021-12-17 DIAGNOSIS — R197 Diarrhea, unspecified: Secondary | ICD-10-CM

## 2021-12-17 DIAGNOSIS — J189 Pneumonia, unspecified organism: Secondary | ICD-10-CM

## 2021-12-17 DIAGNOSIS — K529 Noninfective gastroenteritis and colitis, unspecified: Secondary | ICD-10-CM

## 2021-12-17 DIAGNOSIS — R918 Other nonspecific abnormal finding of lung field: Secondary | ICD-10-CM

## 2021-12-17 DIAGNOSIS — E860 Dehydration: Secondary | ICD-10-CM | POA: Insufficient documentation

## 2021-12-17 DIAGNOSIS — J440 Chronic obstructive pulmonary disease with acute lower respiratory infection: Secondary | ICD-10-CM | POA: Insufficient documentation

## 2021-12-17 LAB — COMPREHENSIVE METABOLIC PANEL, NON-FASTING
ALBUMIN/GLOBULIN RATIO: 0.9 (ref 0.8–1.4)
ALBUMIN: 3.2 g/dL — ABNORMAL LOW (ref 3.4–5.0)
ALKALINE PHOSPHATASE: 92 U/L (ref 46–116)
ALT (SGPT): 12 U/L (ref <=78)
ANION GAP: 6 mmol/L — ABNORMAL LOW (ref 10–20)
AST (SGOT): 18 U/L (ref 15–37)
BILIRUBIN TOTAL: 0.6 mg/dL (ref 0.2–1.0)
BUN/CREA RATIO: 23
BUN: 18 mg/dL (ref 7–18)
CALCIUM, CORRECTED: 8.9 mg/dL
CALCIUM: 8.1 mg/dL — ABNORMAL LOW (ref 8.5–10.1)
CHLORIDE: 103 mmol/L (ref 98–107)
CO2 TOTAL: 31 mmol/L (ref 21–32)
CREATININE: 0.78 mg/dL (ref 0.55–1.02)
ESTIMATED GFR: 81 mL/min/{1.73_m2} (ref >59)
GLOBULIN: 3.6
GLUCOSE: 91 mg/dL (ref 74–106)
OSMOLALITY, CALCULATED: 281 mOsm/kg (ref 270–290)
POTASSIUM: 3.8 mmol/L (ref 3.5–5.1)
PROTEIN TOTAL: 6.8 g/dL (ref 6.4–8.2)
SODIUM: 140 mmol/L (ref 136–145)

## 2021-12-17 LAB — LIPASE: LIPASE: 184 U/L (ref 73–393)

## 2021-12-17 LAB — URINALYSIS, MICROSCOPIC

## 2021-12-17 LAB — PT/INR
INR: 1.1 (ref 0.88–1.10)
PROTHROMBIN TIME: 11.7 seconds (ref 9.8–12.7)

## 2021-12-17 LAB — URINALYSIS, MACRO/MICRO
BLOOD: NEGATIVE mg/dL
GLUCOSE: NEGATIVE mg/dL
KETONES: NEGATIVE mg/dL
NITRITE: NEGATIVE
PH: 5.5 (ref 4.6–8.0)
SPECIFIC GRAVITY: >=1.03 (ref 1.003–1.035)
UROBILINOGEN: 1 mg/dL (ref 0.2–1.0)

## 2021-12-17 LAB — CBC WITH DIFF
BASOPHIL #: 0.01 10*3/uL (ref 0.00–0.30)
BASOPHIL %: 0 % (ref 0–3)
EOSINOPHIL #: 0.45 10*3/uL (ref 0.00–0.80)
EOSINOPHIL %: 6 % (ref 0–7)
HCT: 36.1 % — ABNORMAL LOW (ref 37.0–47.0)
HGB: 11.5 g/dL — ABNORMAL LOW (ref 12.5–16.0)
LYMPHOCYTE #: 1.12 10*3/uL (ref 1.10–5.00)
LYMPHOCYTE %: 16 % — ABNORMAL LOW (ref 25–45)
MCH: 27.8 pg (ref 27.0–32.0)
MCHC: 31.9 g/dL — ABNORMAL LOW (ref 32.0–36.0)
MCV: 87.1 fL (ref 78.0–99.0)
MONOCYTE #: 0.49 10*3/uL (ref 0.00–1.30)
MONOCYTE %: 7 % (ref 0–12)
MPV: 11.1 fL — ABNORMAL HIGH (ref 7.4–10.4)
NEUTROPHIL #: 4.89 10*3/uL (ref 1.80–8.40)
NEUTROPHIL %: 70 % (ref 40–76)
PLATELETS: 141 10*3/uL (ref 140–440)
RBC: 4.14 10*6/uL — ABNORMAL LOW (ref 4.20–5.40)
RDW: 19.2 % — ABNORMAL HIGH (ref 11.6–14.8)
WBC: 7 10*3/uL (ref 4.0–10.5)

## 2021-12-17 LAB — PTT (PARTIAL THROMBOPLASTIN TIME): APTT: 32.2 seconds — ABNORMAL HIGH (ref 22.0–31.7)

## 2021-12-17 MED ORDER — AZITHROMYCIN 500 MG TABLET
500.0000 mg | ORAL_TABLET | ORAL | 0 refills | Status: DC
Start: 2021-12-17 — End: 2022-07-02

## 2021-12-17 MED ORDER — SODIUM CHLORIDE 0.9 % IV BOLUS
1000.0000 mL | INJECTION | Status: AC
Start: 2021-12-17 — End: 2021-12-17
  Administered 2021-12-17: 0 mL via INTRAVENOUS
  Administered 2021-12-17: 1000 mL via INTRAVENOUS

## 2021-12-17 MED ORDER — ONDANSETRON HCL (PF) 4 MG/2 ML INJECTION SOLUTION
4.0000 mg | INTRAMUSCULAR | Status: AC
Start: 2021-12-17 — End: 2021-12-17
  Administered 2021-12-17: 4 mg via INTRAVENOUS

## 2021-12-17 MED ORDER — ONDANSETRON 4 MG DISINTEGRATING TABLET: 4.0000 mg | ORAL_TABLET | Freq: Three times a day (TID) | ORAL | 0 refills | Status: DC | PRN

## 2021-12-17 MED ORDER — SODIUM CHLORIDE 0.9 % INTRAVENOUS PIGGYBACK
INJECTION | INTRAVENOUS | Status: AC
Start: 2021-12-17 — End: 2021-12-17
  Filled 2021-12-17: qty 50

## 2021-12-17 MED ORDER — AZITHROMYCIN 250 MG TABLET
500.0000 mg | ORAL_TABLET | ORAL | Status: AC
Start: 2021-12-17 — End: 2021-12-17
  Administered 2021-12-17: 500 mg via ORAL

## 2021-12-17 MED ORDER — ONDANSETRON HCL (PF) 4 MG/2 ML INJECTION SOLUTION
INTRAMUSCULAR | Status: AC
Start: 2021-12-17 — End: 2021-12-17
  Filled 2021-12-17: qty 2

## 2021-12-17 MED ORDER — SODIUM CHLORIDE 0.9 % INTRAVENOUS PIGGYBACK
1.0000 g | INTRAVENOUS | Status: AC
Start: 2021-12-17 — End: 2021-12-17
  Administered 2021-12-17: 0 g via INTRAVENOUS
  Administered 2021-12-17: 1 g via INTRAVENOUS

## 2021-12-17 MED ORDER — CEFTRIAXONE 1 GRAM SOLUTION FOR INJECTION
INTRAMUSCULAR | Status: AC
Start: 2021-12-17 — End: 2021-12-17
  Filled 2021-12-17: qty 10

## 2021-12-17 MED ORDER — AZITHROMYCIN 250 MG TABLET
ORAL_TABLET | ORAL | Status: AC
Start: 2021-12-17 — End: 2021-12-17
  Filled 2021-12-17: qty 2

## 2021-12-17 NOTE — ED Provider Notes (Signed)
[facility] Emergency Department  ED Primary Provider Note  History of Present Illness   Chief Complaint   Patient presents with   . Diarrhea     Misty Johnson is a 71 y.o. female who had concerns including Diarrhea.  Arrival: The patient arrived by Car complaining of being nauseous since yesterday and then having to bouts of watery brown diarrhea this morning.  Patient states she feels like she wants to vomit but has not.  Patient denies any abdominal pain.  No dysuria or increased frequency.  No fever chills.  Patient denies eating any food that was old in the refrigerator or that tasted funny.  Patient denies being around anybody that is had stomach virus.  Patient denies any history of diverticulitis or colitis.  Patient denies black or bloody stools.  Patient states coughing nonproductive over the past several days as well.  Patient does have a history of COPD and is presently on 2 L nasal cannula at home.    HPI  Review of Systems   Review of Systems   Constitutional: Positive for activity change and appetite change. Negative for chills and fever.   HENT: Negative for ear pain and sore throat.    Eyes: Negative for pain and visual disturbance.   Respiratory: Positive for cough. Negative for shortness of breath.    Cardiovascular: Negative for chest pain and palpitations.   Gastrointestinal: Positive for diarrhea and nausea. Negative for abdominal pain and vomiting.   Genitourinary: Negative for dysuria and hematuria.   Musculoskeletal: Negative for arthralgias and back pain.   Skin: Negative for color change and rash.   Neurological: Negative for seizures and syncope.   All other systems reviewed and are negative.     Historical Data   History Reviewed This Encounter:     Physical Exam   ED Triage Vitals [12/17/21 1231]   BP (Non-Invasive) (!) 114/45   Heart Rate 60   Respiratory Rate 19   Temperature 36.7 C (98.1 F)   SpO2 93 %   Weight 75.8 kg (167 lb)   Height  1.626 m ('5\' 4"' )     Physical Exam  Vitals and nursing note reviewed.   Constitutional:       General: She is not in acute distress.     Appearance: She is well-developed. She is obese.   HENT:      Head: Normocephalic and atraumatic.      Right Ear: External ear normal.      Left Ear: External ear normal.      Nose: Nose normal.      Mouth/Throat:      Mouth: Mucous membranes are dry.   Eyes:      Extraocular Movements: Extraocular movements intact.      Conjunctiva/sclera: Conjunctivae normal.      Pupils: Pupils are equal, round, and reactive to light.   Cardiovascular:      Rate and Rhythm: Normal rate and regular rhythm.      Pulses: Normal pulses.      Heart sounds: Normal heart sounds. No murmur heard.  Pulmonary:      Effort: Pulmonary effort is normal. No respiratory distress.      Breath sounds: Normal breath sounds.   Abdominal:      Palpations: Abdomen is soft.      Tenderness: There is no abdominal tenderness.      Comments: Positive hyperactive bowel sounds throughout the abdomen.   Musculoskeletal:  General: No swelling. Normal range of motion.      Cervical back: Normal range of motion and neck supple.   Skin:     General: Skin is warm and dry.      Capillary Refill: Capillary refill takes less than 2 seconds.   Neurological:      General: No focal deficit present.      Mental Status: She is alert and oriented to person, place, and time.   Psychiatric:         Mood and Affect: Mood normal.         Behavior: Behavior normal.         Thought Content: Thought content normal.       Patient Data     Labs Ordered/Reviewed   COMPREHENSIVE METABOLIC PANEL, NON-FASTING - Abnormal; Notable for the following components:       Result Value    ANION GAP 6 (*)     ALBUMIN 3.2 (*)     CALCIUM 8.1 (*)     All other components within normal limits    Narrative:     Estimated Glomerular Filtration Rate (eGFR) is calculated using the CKD-EPI (2021) equation, intended for patients 50 years of age and older. If  gender is not documented or "unknown", there will be no eGFR calculation.   PTT (PARTIAL THROMBOPLASTIN TIME) - Abnormal; Notable for the following components:    APTT 32.2 (*)     All other components within normal limits   CBC WITH DIFF - Abnormal; Notable for the following components:    RBC 4.14 (*)     HGB 11.5 (*)     HCT 36.1 (*)     MCHC 31.9 (*)     RDW 19.2 (*)     MPV 11.1 (*)     LYMPHOCYTE % 16 (*)     All other components within normal limits   URINALYSIS, MACRO/MICRO - Abnormal; Notable for the following components:    LEUKOCYTES Trace (*)     PROTEIN Trace (*)     BILIRUBIN Small (*)     All other components within normal limits   URINALYSIS, MICROSCOPIC - Abnormal; Notable for the following components:    BACTERIA Rare (*)     MUCOUS Moderate (*)     All other components within normal limits   LIPASE - Normal   PT/INR - Normal   CBC/DIFF    Narrative:     The following orders were created for panel order CBC/DIFF.  Procedure                               Abnormality         Status                     ---------                               -----------         ------                     CBC WITH DIFF[[id number]]                Abnormal            Final result  Please view results for these tests on the individual orders.   URINALYSIS WITH REFLEX MICROSCOPIC AND CULTURE IF POSITIVE    Narrative:     The following orders were created for panel order URINALYSIS WITH REFLEX MICROSCOPIC AND CULTURE IF POSITIVE.  Procedure                               Abnormality         Status                     ---------                               -----------         ------                     URINALYSIS, MACRO/MICRO[[id number]]      Abnormal            Final result               URINALYSIS, MICROSCOPIC[[id number]]      Abnormal            Final result                 Please view results for these tests on the individual orders.     XR ABD FLAT AND UPRIGHT SERIES (W PA CHEST)   Final Result by Edi,  Radresults In (06/16 1324)   1.RIGHT BASILAR OPACITY COULD BE DUE TO PNEUMONIA OR ATELECTASIS   2.NO ACUTE FINDINGS IN THE ABDOMEN            Radiologist location ID: [id number] Decision Making        Medical Decision Making  Patient is 71 year old white female complaining having nausea, and diarrhea x2 this morning.  Patient states the nausea started yesterday.  Patient denies any vomiting.  Patient denies eating any food that was old in the refrigerator or tasted funny.  Patient denies any fever chills.  No dysuria or increased frequency.  Patient will have labs and an x-ray done of her abdomen pelvis.  Patient will also be hydrated with a L of normal saline and given anti emetic Zofran.  Once labs and x-ray return and everything is within normal limits patient will be discharged on an anti emetic.    Amount and/or Complexity of Data Reviewed  Labs: ordered.  Radiology: ordered.      Risk  Prescription drug management.               Medications Administered in the ED   cefTRIAXone (ROCEPHIN) 1 g in NS 50 mL IVPB minibag (has no administration in time range)   azithromycin (ZITHROMAX) tablet (500 mg Oral Incomplete 12/17/21 1539)   NS bolus infusion 1,000 mL (1,000 mL Intravenous New Bag/New Syringe 12/17/21 1307)   ondansetron (ZOFRAN) 2 mg/mL injection (4 mg Intravenous Given 12/17/21 1307)     Clinical Impression   Pneumonia (Primary)   Diarrhea, unspecified type   Enteritis   Dehydration       Disposition: Discharged               Clinical Impression   Pneumonia (Primary)   Diarrhea, unspecified type   Enteritis   Dehydration  Current Discharge Medication List      START taking these medications    Details   azithromycin (ZITHROMAX) 500 mg Oral Tablet Take 1 Tablet (500 mg total) by mouth Every 24 hours  Qty: 6 Tablet, Refills: 0      ondansetron (ZOFRAN ODT) 4 mg Oral Tablet, Rapid Dissolve Take 1 Tablet (4 mg total) by mouth Every 8 hours as needed for Nausea/Vomiting  Qty: 12 Tablet,  Refills: 0

## 2021-12-17 NOTE — ED Nurses Note (Signed)
Patient discharged home via wheelchair with O2 with her daughter.

## 2021-12-17 NOTE — ED Triage Notes (Signed)
Nausea for 2 day and diarrhea x 2  Up at 4 am this morning with diarrhea  Also has a cough that started yesterday  Increased fatigue

## 2022-04-13 ENCOUNTER — Other Ambulatory Visit: Payer: Self-pay

## 2022-04-13 ENCOUNTER — Ambulatory Visit: Payer: 59 | Attending: INTERNAL MEDICINE

## 2022-04-13 DIAGNOSIS — R079 Chest pain, unspecified: Secondary | ICD-10-CM | POA: Insufficient documentation

## 2022-04-13 LAB — TROPONIN-I: TROPONIN I: 8 ng/L (ref <15)

## 2022-05-03 ENCOUNTER — Other Ambulatory Visit: Payer: Self-pay

## 2022-05-03 ENCOUNTER — Emergency Department
Admission: EM | Admit: 2022-05-03 | Discharge: 2022-05-03 | Disposition: A | Discharge status: Home / Self Care | Payer: 59 | Attending: Emergency Medicine | Admitting: Emergency Medicine

## 2022-05-03 ENCOUNTER — Emergency Department (HOSPITAL_BASED_OUTPATIENT_CLINIC_OR_DEPARTMENT_OTHER): Payer: 59

## 2022-05-03 ENCOUNTER — Encounter (HOSPITAL_BASED_OUTPATIENT_CLINIC_OR_DEPARTMENT_OTHER): Payer: Self-pay

## 2022-05-03 DIAGNOSIS — J189 Pneumonia, unspecified organism: Secondary | ICD-10-CM

## 2022-05-03 DIAGNOSIS — Z9981 Dependence on supplemental oxygen: Secondary | ICD-10-CM | POA: Insufficient documentation

## 2022-05-03 DIAGNOSIS — J40 Bronchitis, not specified as acute or chronic: Secondary | ICD-10-CM | POA: Insufficient documentation

## 2022-05-03 DIAGNOSIS — R0602 Shortness of breath: Secondary | ICD-10-CM

## 2022-05-03 DIAGNOSIS — R001 Bradycardia, unspecified: Secondary | ICD-10-CM | POA: Insufficient documentation

## 2022-05-03 DIAGNOSIS — J441 Chronic obstructive pulmonary disease with (acute) exacerbation: Secondary | ICD-10-CM | POA: Insufficient documentation

## 2022-05-03 DIAGNOSIS — J440 Chronic obstructive pulmonary disease with acute lower respiratory infection: Secondary | ICD-10-CM

## 2022-05-03 DIAGNOSIS — Z1152 Encounter for screening for COVID-19: Secondary | ICD-10-CM | POA: Insufficient documentation

## 2022-05-03 LAB — COMPREHENSIVE METABOLIC PANEL, NON-FASTING
ALBUMIN/GLOBULIN RATIO: 1 (ref 0.8–1.4)
ALBUMIN: 3.2 g/dL — ABNORMAL LOW (ref 3.4–5.0)
ALKALINE PHOSPHATASE: 67 U/L (ref 46–116)
ALT (SGPT): 10 U/L (ref <=78)
ANION GAP: 8 mmol/L (ref 4–13)
AST (SGOT): 19 U/L (ref 15–37)
BILIRUBIN TOTAL: 0.6 mg/dL (ref 0.2–1.0)
BUN/CREA RATIO: 26
BUN: 27 mg/dL — ABNORMAL HIGH (ref 7–18)
CALCIUM, CORRECTED: 10 mg/dL
CALCIUM: 9.2 mg/dL (ref 8.5–10.1)
CHLORIDE: 101 mmol/L (ref 98–107)
CO2 TOTAL: 31 mmol/L (ref 21–32)
CREATININE: 1.04 mg/dL — ABNORMAL HIGH (ref 0.55–1.02)
ESTIMATED GFR: 57 mL/min/{1.73_m2} — ABNORMAL LOW (ref >59)
GLOBULIN: 3.2
GLUCOSE: 82 mg/dL (ref 74–106)
OSMOLALITY, CALCULATED: 284 mOsm/kg (ref 270–290)
POTASSIUM: 3.5 mmol/L (ref 3.5–5.1)
PROTEIN TOTAL: 6.4 g/dL (ref 6.4–8.2)
SODIUM: 140 mmol/L (ref 136–145)

## 2022-05-03 LAB — ARTERIAL BLOOD GAS/LACTATE
%FIO2 (ARTERIAL): 32 %
BASE EXCESS (ARTERIAL): 4.3 mmol/L — ABNORMAL HIGH (ref <=2.0)
BICARBONATE (ARTERIAL): 28.6 mmol/L — ABNORMAL HIGH (ref 20.0–26.0)
CARBOXYHEMOGLOBIN: 4.4 % — ABNORMAL HIGH (ref <=1.5)
LACTATE: 0.7 mmol/L (ref <=2.0)
MET-HEMOGLOBIN: 0.2 % (ref <=2.0)
O2CT: 15.3 %
OXYHEMOGLOBIN: 89.5 % (ref 88.0–100.0)
PCO2 (ARTERIAL): 41 mm/Hg (ref 35–45)
PH (ARTERIAL): 7.46 — ABNORMAL HIGH (ref 7.35–7.45)
PO2 (ARTERIAL): 61 mm/Hg — ABNORMAL LOW (ref 80–100)

## 2022-05-03 LAB — CBC WITH DIFF
BASOPHIL #: 0.03 10*3/uL (ref 0.00–0.30)
BASOPHIL %: 0 % (ref 0–3)
EOSINOPHIL #: 0.22 10*3/uL (ref 0.00–0.80)
EOSINOPHIL %: 2 % (ref 0–7)
HCT: 39 % (ref 37.0–47.0)
HGB: 12.9 g/dL (ref 12.5–16.0)
LYMPHOCYTE #: 1.71 10*3/uL (ref 1.10–5.00)
LYMPHOCYTE %: 18 % — ABNORMAL LOW (ref 25–45)
MCH: 30 pg (ref 27.0–32.0)
MCHC: 33.2 g/dL (ref 32.0–36.0)
MCV: 90.3 fL (ref 78.0–99.0)
MONOCYTE #: 0.87 10*3/uL (ref 0.00–1.30)
MONOCYTE %: 9 % (ref 0–12)
MPV: 9.6 fL (ref 7.4–10.4)
NEUTROPHIL #: 6.73 10*3/uL (ref 1.80–8.40)
NEUTROPHIL %: 70 % (ref 40–76)
PLATELETS: 187 10*3/uL (ref 140–440)
RBC: 4.32 10*6/uL (ref 4.20–5.40)
RDW: 17 % — ABNORMAL HIGH (ref 11.6–14.8)
WBC: 9.6 10*3/uL (ref 4.0–10.5)

## 2022-05-03 LAB — NT-PROBNP: NT-PROBNP: 1669 pg/mL — ABNORMAL HIGH (ref <=125)

## 2022-05-03 LAB — ECG 12 LEAD
Atrial Rate: 48 {beats}/min
Calculated P Axis: 33 degrees
Calculated R Axis: 15 degrees
Calculated T Axis: 77 degrees
PR Interval: 114 ms
QRS Duration: 92 ms
QT Interval: 426 ms
QTC Calculation: 380 ms
Ventricular rate: 48 {beats}/min

## 2022-05-03 LAB — COVID-19, FLU A/B, RSV RAPID BY PCR
INFLUENZA VIRUS TYPE A: NOT DETECTED
INFLUENZA VIRUS TYPE B: NOT DETECTED
RESPIRATORY SYNCTIAL VIRUS (RSV): NOT DETECTED
SARS-CoV-2: NOT DETECTED

## 2022-05-03 MED ORDER — METHYLPREDNISOLONE SOD SUCC 125 MG SOLUTION FOR INJECTION WRAPPER
INTRAVENOUS | Status: AC
Start: 2022-05-03 — End: 2022-05-03
  Filled 2022-05-03: qty 2

## 2022-05-03 MED ORDER — IPRATROPIUM 0.5 MG-ALBUTEROL 3 MG (2.5 MG BASE)/3 ML NEBULIZATION SOLN
3.0000 mL | INHALATION_SOLUTION | RESPIRATORY_TRACT | Status: AC
Start: 2022-05-03 — End: 2022-05-03
  Administered 2022-05-03: 3 mL via RESPIRATORY_TRACT

## 2022-05-03 MED ORDER — METHYLPREDNISOLONE SOD SUCC 125 MG SOLUTION FOR INJECTION WRAPPER
125.0000 mg | INTRAVENOUS | Status: AC
Start: 2022-05-03 — End: 2022-05-03
  Administered 2022-05-03: 125 mg via INTRAVENOUS

## 2022-05-03 MED ORDER — IPRATROPIUM 0.5 MG-ALBUTEROL 3 MG (2.5 MG BASE)/3 ML NEBULIZATION SOLN
INHALATION_SOLUTION | RESPIRATORY_TRACT | Status: AC
Start: 2022-05-03 — End: 2022-05-03
  Filled 2022-05-03: qty 3

## 2022-05-03 NOTE — ED Nurses Note (Signed)
Patient discharged home all instructions gone over with patient including medications with opportunity to ask questions. Patient verbalized understanding and was taken by wheelchair with O2 to front entrance and assisted in car with family.

## 2022-05-03 NOTE — ED Triage Notes (Signed)
Ems reports pt dx pneumonia last week, taking abx. Pt went to bathroom this am and became short of breath. Pt uses home O2 3 lit nc. Ems reports pt uses a very long O2 tube. Initial ems sat  94% on pt's home O2.

## 2022-05-03 NOTE — ED Nurses Note (Signed)
Patient feeling some better after breathing treatment.

## 2022-05-03 NOTE — ED Provider Notes (Signed)
[facility] Emergency Department  ED Primary Provider Note  History of Present Illness   Chief Complaint   Patient presents with    Shortness of Breath     Misty Johnson is a 71 y.o. female who had concerns including Shortness of Breath.  Arrival: The patient arrived by Ambulance complaining increased shortness of breath this morning while she was getting out of bed.  Patient normally is on 3 L nasal O2 all the time at home.  When EMS arrived they found the patient to have a pulse ox of 94% on 3 L. patient states she is no longer coughing.  She denies any fever chills.  Patient was placed on Levaquin last week for pneumonia.  Patient states that is better but she occasionally gets pain in her upper chest when she takes a deep breath.  She denies any nausea vomiting or diarrhea.  Patient has a long history of COPD.  Patient is still smoking daily.    HPI  Review of Systems   Review of Systems   Constitutional:  Positive for activity change and appetite change. Negative for chills and fever.   HENT:  Negative for ear pain and sore throat.    Eyes:  Negative for pain and visual disturbance.   Respiratory:  Positive for cough, shortness of breath and wheezing.    Cardiovascular:  Negative for chest pain and palpitations.   Gastrointestinal:  Negative for abdominal pain and vomiting.   Genitourinary:  Negative for dysuria and hematuria.   Musculoskeletal:  Negative for arthralgias and back pain.   Skin:  Negative for color change and rash.   Neurological:  Negative for seizures and syncope.   All other systems reviewed and are negative.     Historical Data   History Reviewed This Encounter:     Physical Exam   ED Triage Vitals [05/03/22 0845]   BP (Non-Invasive) (!) 112/44   Heart Rate 50   Respiratory Rate 20   Temperature 36.1 C (96.9 F)   SpO2 97 %   Weight 75.8 kg (167 lb)   Height 1.626 m (_0 )     Physical Exam  Vitals and nursing note reviewed.   Constitutional:        General: She is not in acute distress.     Appearance: She is well-developed. She is obese.   HENT:      Head: Normocephalic and atraumatic.      Right Ear: External ear normal.      Left Ear: External ear normal.      Nose: Nose normal.      Mouth/Throat:      Mouth: Mucous membranes are moist.   Eyes:      Extraocular Movements: Extraocular movements intact.      Conjunctiva/sclera: Conjunctivae normal.      Pupils: Pupils are equal, round, and reactive to light.   Cardiovascular:      Rate and Rhythm: Regular rhythm. Bradycardia present.      Pulses: Normal pulses.      Heart sounds: Normal heart sounds. No murmur heard.  Pulmonary:      Effort: Pulmonary effort is normal. No respiratory distress.      Breath sounds: Wheezing present.      Comments: Inspiratory-expiratory wheezing throughout moving good air.  Abdominal:      General: Bowel sounds are normal.      Palpations: Abdomen is soft.      Tenderness: There is  no abdominal tenderness.   Musculoskeletal:         General: No swelling. Normal range of motion.      Cervical back: Normal range of motion and neck supple.   Skin:     General: Skin is warm and dry.      Capillary Refill: Capillary refill takes less than 2 seconds.   Neurological:      General: No focal deficit present.      Mental Status: She is alert and oriented to person, place, and time.   Psychiatric:         Mood and Affect: Mood normal.         Behavior: Behavior normal.         Thought Content: Thought content normal.         Judgment: Judgment normal.       Patient Data     Labs Ordered/Reviewed   ARTERIAL BLOOD GAS/LACTATE - Abnormal; Notable for the following components:       Result Value    PH (ARTERIAL) 7.46 (*)     BICARBONATE (ARTERIAL) 28.6 (*)     BASE EXCESS (ARTERIAL) 4.3 (*)     CARBOXYHEMOGLOBIN 4.4 (*)     PO2 (ARTERIAL) 61 (*)     All other components within normal limits   NT-PROBNP - Abnormal; Notable for the following components:    NT-PROBNP 1,669 (*)     All other  components within normal limits   COMPREHENSIVE METABOLIC PANEL, NON-FASTING - Abnormal; Notable for the following components:    BUN 27 (*)     CREATININE 1.04 (*)     ESTIMATED GFR 57 (*)     ALBUMIN 3.2 (*)     All other components within normal limits    Narrative:     Estimated Glomerular Filtration Rate (eGFR) is calculated using the CKD-EPI (2021) equation, intended for patients 83 years of age and older. If gender is not documented or "unknown", there will be no eGFR calculation.   CBC WITH DIFF - Abnormal; Notable for the following components:    RDW 17.0 (*)     LYMPHOCYTE % 18 (*)     All other components within normal limits   COVID-19, FLU A/B, RSV RAPID BY PCR - Normal    Narrative:     Results are for the simultaneous qualitative identification of SARS-CoV-2 (formerly 2019-nCoV), Influenza A, Influenza B, and RSV RNA. These etiologic agents are generally detectable in nasopharyngeal and nasal swabs during the ACUTE PHASE of infection. Hence, this test is intended to be performed on respiratory specimens collected from individuals with signs and symptoms of upper respiratory tract infection who meet Centers for Disease Control and Prevention (CDC) clinical and/or epidemiological criteria for Coronavirus Disease 2019 (COVID-19) testing. CDC COVID-19 criteria for testing on human specimens is available at [facility] webpage information for Healthcare Professionals: Coronavirus Disease 2019 (COVID-19) ([employer]).     False-negative results may occur if the virus has genomic mutations, insertions, deletions, or rearrangements or if performed very early in the course of illness. Otherwise, negative results indicate virus specific RNA targets are not detected, however negative results do not preclude SARS-CoV-2 infection/COVID-19, Influenza, or Respiratory syncytial virus infection. Results should not be used as the sole basis for patient management decisions.  Negative results must be combined with clinical observations, patient history, and epidemiological information. If upper respiratory tract infection is still suspected based on exposure history together with other clinical findings, re-testing should be  considered.    Disclaimer:   This assay has been authorized by FDA under an Emergency Use Authorization for use in laboratories certified under the Clinical Laboratory Improvement Amendments of 1988 (CLIA), 42 U.S.C. ([phone], to perform high complexity tests. The impacts of vaccines, antiviral therapeutics, antibiotics, chemotherapeutic or immunosuppressant drugs have not been evaluated.     Test methodology:   Cepheid Xpert Xpress SARS-CoV-2/Flu/RSV Assay real-time polymerase chain reaction (RT-PCR) test on the GeneXpert Dx and Xpert Xpress systems.   CBC/DIFF    Narrative:     The following orders were created for panel order CBC/DIFF.  Procedure                               Abnormality         Status                     ---------                               -----------         ------                     CBC WITH IWPY[[id number]]                Abnormal            Final result                 Please view results for these tests on the individual orders.   URINALYSIS WITH REFLEX MICROSCOPIC AND CULTURE IF POSITIVE    Narrative:     The following orders were created for panel order URINALYSIS WITH REFLEX MICROSCOPIC AND CULTURE IF POSITIVE.  Procedure                               Abnormality         Status                     ---------                               -----------         ------                     URINALYSIS, MACRO/MICRO[[id number]]                                                       Please view results for these tests on the individual orders.   URINALYSIS, MACRO/MICRO     XR AP MOBILE CHEST   Final Result by Edi, Radresults In (10/31 0539)   Chronic appearing changes at the lung bases.         Radiologist location ID: [place] [facility]        Medical Decision Making  Patient is 71 year old white female complaining awakening this morning with shortness of breath.  Patient had called EMS and was found to have pulse ox  of 94% on 3 L. patient is on 3 L around the clock.  Patient still smokes daily.  She denied any chest pain at that time.  Presently she is being treated for pneumonia with Levaquin.  She finished her steroid, Medrol Dosepak, yesterday.  Patient will have labs and x-ray done.  Patient will also have a DuoNeb and Solu-Medrol.  More than likely the patient will be discharged home after seeing her x-ray and labs.  Patient may have had bronchospasm, worsening pneumonia, and exacerbation COPD, patient will then be discharged home to follow up with her family physician in the next 3-4 days.    Amount and/or Complexity of Data Reviewed  Labs: ordered.  Radiology: ordered.  ECG/medicine tests: ordered.     Details: Sinus bradycardia 48, PR interval 114 MS, QT interval 426 MS, LVH, flat T-waves 2, 3, AVF, V4 through V6, poor R-wave progression through the precordium.    Risk  Prescription drug management.             Medications Administered in the ED   methylPREDNISolone sod succ (SOLU-medrol) 125 mg/2 mL injection (125 mg Intravenous Given 05/03/22 0911)   ipratropium-albuterol 0.5 mg-3 mg(2.5 mg base)/3 mL Solution for Nebulization (3 mL Nebulization Given 05/03/22 0914)     Clinical Impression   Acute exacerbation of chronic obstructive pulmonary disease (COPD) (CMS HCC) (Primary)   Shortness of breath   Bronchitis       Disposition: Discharged               Clinical Impression   Acute exacerbation of chronic obstructive pulmonary disease (COPD) (CMS HCC) (Primary)   Shortness of breath   Bronchitis       Current Discharge Medication List

## 2022-06-30 ENCOUNTER — Inpatient Hospital Stay (HOSPITAL_COMMUNITY): Payer: 59 | Admitting: HOSPITALIST

## 2022-06-30 ENCOUNTER — Emergency Department (HOSPITAL_BASED_OUTPATIENT_CLINIC_OR_DEPARTMENT_OTHER): Payer: 59

## 2022-06-30 ENCOUNTER — Inpatient Hospital Stay
Admission: EM | Admit: 2022-06-30 | Discharge: 2022-07-02 | DRG: 190 | Disposition: A | Discharge status: Home / Self Care | Payer: 59 | Attending: HOSPITALIST | Admitting: HOSPITALIST

## 2022-06-30 ENCOUNTER — Other Ambulatory Visit: Payer: Self-pay

## 2022-06-30 DIAGNOSIS — J9690 Respiratory failure, unspecified, unspecified whether with hypoxia or hypercapnia: Secondary | ICD-10-CM

## 2022-06-30 DIAGNOSIS — J441 Chronic obstructive pulmonary disease with (acute) exacerbation: Principal | ICD-10-CM | POA: Diagnosis present

## 2022-06-30 DIAGNOSIS — Z79899 Other long term (current) drug therapy: Secondary | ICD-10-CM

## 2022-06-30 DIAGNOSIS — J9621 Acute and chronic respiratory failure with hypoxia: Secondary | ICD-10-CM | POA: Diagnosis present

## 2022-06-30 DIAGNOSIS — I2510 Atherosclerotic heart disease of native coronary artery without angina pectoris: Secondary | ICD-10-CM | POA: Diagnosis present

## 2022-06-30 DIAGNOSIS — J9601 Acute respiratory failure with hypoxia: Secondary | ICD-10-CM

## 2022-06-30 DIAGNOSIS — R32 Unspecified urinary incontinence: Secondary | ICD-10-CM | POA: Diagnosis present

## 2022-06-30 DIAGNOSIS — Z1152 Encounter for screening for COVID-19: Secondary | ICD-10-CM

## 2022-06-30 DIAGNOSIS — R0600 Dyspnea, unspecified: Secondary | ICD-10-CM

## 2022-06-30 DIAGNOSIS — F17210 Nicotine dependence, cigarettes, uncomplicated: Secondary | ICD-10-CM | POA: Diagnosis present

## 2022-06-30 DIAGNOSIS — Z7982 Long term (current) use of aspirin: Secondary | ICD-10-CM

## 2022-06-30 DIAGNOSIS — I509 Heart failure, unspecified: Secondary | ICD-10-CM | POA: Diagnosis present

## 2022-06-30 LAB — CBC WITH DIFF
HCT: 37.2 % (ref 37.0–47.0)
HGB: 11.6 g/dL — ABNORMAL LOW (ref 12.5–16.0)
MCH: 28.9 pg (ref 27.0–32.0)
MCHC: 31.3 g/dL — ABNORMAL LOW (ref 32.0–36.0)
MCV: 92.6 fL (ref 78.0–99.0)
MPV: 10.8 fL — ABNORMAL HIGH (ref 7.4–10.4)
PLATELETS: 166 10*3/uL (ref 140–440)
RBC: 4.02 10*6/uL — ABNORMAL LOW (ref 4.20–5.40)
RDW: 16 % — ABNORMAL HIGH (ref 11.6–14.8)
WBC: 6.4 10*3/uL (ref 4.0–10.5)

## 2022-06-30 LAB — COMPREHENSIVE METABOLIC PANEL, NON-FASTING
ALBUMIN/GLOBULIN RATIO: 0.9 (ref 0.8–1.4)
ALBUMIN: 3.1 g/dL — ABNORMAL LOW (ref 3.4–5.0)
ALKALINE PHOSPHATASE: 82 U/L (ref 46–116)
ALT (SGPT): 12 U/L (ref <=78)
ANION GAP: 5 mmol/L (ref 4–13)
AST (SGOT): 12 U/L — ABNORMAL LOW (ref 15–37)
BILIRUBIN TOTAL: 0.4 mg/dL (ref 0.2–1.0)
BUN/CREA RATIO: 17
BUN: 14 mg/dL (ref 7–18)
CALCIUM, CORRECTED: 10 mg/dL
CALCIUM: 9.3 mg/dL (ref 8.5–10.1)
CHLORIDE: 103 mmol/L (ref 98–107)
CO2 TOTAL: 32 mmol/L (ref 21–32)
CREATININE: 0.81 mg/dL (ref 0.55–1.02)
ESTIMATED GFR: 77 mL/min/{1.73_m2} (ref >59)
GLOBULIN: 3.5
GLUCOSE: 101 mg/dL (ref 74–106)
OSMOLALITY, CALCULATED: 280 mOsm/kg (ref 270–290)
POTASSIUM: 4.2 mmol/L (ref 3.5–5.1)
PROTEIN TOTAL: 6.6 g/dL (ref 6.4–8.2)
SODIUM: 140 mmol/L (ref 136–145)

## 2022-06-30 LAB — MANUAL DIFFERENTIAL
BAND %: 2 % — ABNORMAL LOW (ref 5–11)
BANDS NEUTROPHILS MANUAL: 2
BASOPHIL %: 1 % (ref 0–3)
BASOPHIL ABSOLUTE: 0.06 10*3/uL (ref 0.00–0.30)
BASOPHILS MANUAL: 1
EOSINOPHIL %: 4 % (ref 0–7)
EOSINOPHIL ABSOLUTE: 0.26 10*3/uL (ref 0.00–0.80)
EOSINOPHILS MANUAL: 4
LYMPHOCYTE %: 18 % — ABNORMAL LOW (ref 25–45)
LYMPHOCYTE ABSOLUTE: 1.15 10*3/uL (ref 1.10–5.00)
LYMPHOCYTES MANUAL: 18
MONOCYTE %: 4 % (ref 0–12)
MONOCYTE ABSOLUTE: 0.26 10*3/uL (ref 0.00–1.30)
MONOCYTES MANUAL: 4
NEUTROPHIL %: 71 % (ref 40–76)
NEUTROPHIL ABSOLUTE: 4.67 10*3/uL (ref 1.80–8.40)
NEUTROPHILS MANUAL: 71
PLATELET MORPHOLOGY COMMENT: ADEQUATE
RBC MORPHOLOGY COMMENT: NORMAL
TOTAL CELLS COUNTED [#] IN BLOOD: 100
WBC: 6.4 10*3/uL

## 2022-06-30 LAB — PT/INR
INR: 1.05 (ref 0.88–1.10)
PROTHROMBIN TIME: 12.2 seconds (ref 9.8–12.7)

## 2022-06-30 LAB — MAGNESIUM: MAGNESIUM: 1.9 mg/dL (ref 1.8–2.4)

## 2022-06-30 LAB — NT-PROBNP: NT-PROBNP: 799 pg/mL — ABNORMAL HIGH (ref <=125)

## 2022-06-30 LAB — COVID-19, FLU A/B, RSV RAPID BY PCR
INFLUENZA VIRUS TYPE A: NOT DETECTED
INFLUENZA VIRUS TYPE B: NOT DETECTED
RESPIRATORY SYNCTIAL VIRUS (RSV): NOT DETECTED
SARS-CoV-2: NOT DETECTED

## 2022-06-30 LAB — TROPONIN-I: TROPONIN I: 7 ng/L (ref <15)

## 2022-06-30 LAB — LACTIC ACID LEVEL W/ REFLEX FOR LEVEL >2.0: LACTIC ACID: 0.9 mmol/L (ref 0.4–2.0)

## 2022-06-30 MED ORDER — FUROSEMIDE 10 MG/ML INJECTION SOLUTION
20.0000 mg | INTRAMUSCULAR | Status: AC
Start: 2022-06-30 — End: 2022-06-30
  Administered 2022-06-30: 20 mg via INTRAVENOUS

## 2022-06-30 MED ORDER — MAGNESIUM SULFATE 1 GRAM/100 ML IN DEXTROSE 5 % INTRAVENOUS PIGGYBACK
1.0000 g | INJECTION | Freq: Once | INTRAVENOUS | Status: AC
Start: 2022-06-30 — End: 2022-06-30
  Administered 2022-06-30: 1 g via INTRAVENOUS
  Administered 2022-06-30: 0 g via INTRAVENOUS

## 2022-06-30 MED ORDER — FUROSEMIDE 10 MG/ML INJECTION SOLUTION
INTRAMUSCULAR | Status: AC
Start: 2022-06-30 — End: 2022-06-30
  Filled 2022-06-30: qty 4

## 2022-06-30 MED ORDER — IPRATROPIUM 0.5 MG-ALBUTEROL 3 MG (2.5 MG BASE)/3 ML NEBULIZATION SOLN
3.0000 mL | INHALATION_SOLUTION | RESPIRATORY_TRACT | Status: AC
Start: 2022-07-01 — End: 2022-07-01
  Administered 2022-07-01: 3 mL via RESPIRATORY_TRACT

## 2022-06-30 MED ORDER — MAGNESIUM SULFATE 1 GRAM/100 ML IN DEXTROSE 5 % INTRAVENOUS PIGGYBACK
INJECTION | INTRAVENOUS | Status: AC
Start: 2022-06-30 — End: 2022-06-30
  Filled 2022-06-30: qty 100

## 2022-06-30 MED ORDER — DEXAMETHASONE SODIUM PHOSPHATE (PF) 10 MG/ML INJECTION SOLUTION
INTRAMUSCULAR | Status: AC
Start: 2022-06-30 — End: 2022-06-30
  Filled 2022-06-30: qty 1

## 2022-06-30 MED ORDER — DEXAMETHASONE SODIUM PHOSPHATE (PF) 10 MG/ML INJECTION SOLUTION
10.0000 mg | INTRAMUSCULAR | Status: AC
Start: 2022-06-30 — End: 2022-06-30
  Administered 2022-06-30: 10 mg via INTRAVENOUS

## 2022-06-30 NOTE — ED Triage Notes (Signed)
For about three days, patient has become weaker and more tired, with cough that is non productive and shortness of breath.

## 2022-06-30 NOTE — ED Nurses Note (Signed)
Pt resting in bed at this time. Resp even and non labored. Equal chest rise and fall. Call bell in reach. Family at bedside. IV infusing without difficulty. Cardiac monitor in use.

## 2022-06-30 NOTE — ED Nurses Note (Signed)
Pt states that she has "felt really bad" since yesterday. Pt states that she has had a cough, increased generalized weakness and some shortness of breath. Pt states that her breathing is worse if she lies flat. Pt has swelling noted in bilateral lower extremities. Resp even and non labored. Equal chest rise and fall. Call bell in reach.

## 2022-06-30 NOTE — ED Attending Note (Addendum)
[facility] emergency department         HISTORY OF PRESENT ILLNESS     Date:  06/30/2022  Patient's Name:  Misty Johnson  Date of Birth:  02/19/1951    PATIENT IS A 71-YEAR-OLD HISTORY OF COPD ON 3 L NASAL CANNULA CHRONICALLY.  PATIENT BROUGHT TO THE EMERGENCY ROOM BY HER FAMILY DUE TO SHORTNESS OF BREATH PROGRESSIVELY WORSE OVER THE LAST 4 DAYS.  PATIENT DENIES ANY CHEST PAIN.  SHE HAS HAD A COUGH WHICH IS NONPRODUCTIVE.  PATIENT DENIES ANY FEVERS OR CHILLS.  THERE HAS BEEN NO NAUSEA OR VOMITING.  PATIENT MAINTAINING PULSE OX OF 93-94% ON 3 L NASAL CANNULA ON ARRIVAL TO THE EMERGENCY        Review of Systems     Review of Systems   Constitutional: Negative.    HENT: Negative.     Respiratory:  Positive for shortness of breath.    Cardiovascular: Negative.    Gastrointestinal: Negative.    Genitourinary: Negative.    Musculoskeletal: Negative.    Neurological:  Positive for weakness and light-headedness.   Hematological: Negative.    All other systems reviewed and are negative.      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     Enlarged heart     LVH (left ventricular hypertrophy)     Major depression        Past Surgical History:  Past Surgical History:   Procedure Laterality Date    No past surgeries         Social History:  Social History     Tobacco Use    Smoking status: Every Day     Packs/day: 1     Types: Cigarettes    Smokeless tobacco: Never   Vaping Use    Vaping Use: Never used   Substance Use Topics    Alcohol use: Not Currently    Drug use: Not Currently     Social History      Substance and Sexual Activity   Drug Use Not Currently       Family History:  Family History   Problem Relation Age of Onset    No Known Problems Mother     No Known Problems Father        Medication History:  Current Outpatient Medications   Medication Sig    albuterol sulfate (PROVENTIL) 2.5 mg /3 mL (0.083 %) Inhalation nebulizer solution Take 3 mL (2.5 mg total) by nebulization Once per day as needed for Wheezing    aspirin 81 mg Oral Tablet, Chewable Chew 1 Tablet (81 mg total) Once a day    atorvastatin (LIPITOR) 40 mg Oral Tablet Take 1 Tablet (40 mg total) by mouth Every evening    azithromycin (ZITHROMAX) 500 mg Oral Tablet Take 1 Tablet (500 mg total) by mouth Every 24 hours    budesonide/glycopyr/formoterol (BREZTRI AEROSPHERE INHL) Take by inhalation Once a day    ergocalciferol, vitamin D2, (  DRISDOL) 1,250 mcg (50,000 unit) Oral Capsule Take 1 Capsule (50,000 Units total) by mouth Every 7 days    escitalopram oxalate (LEXAPRO) 20 mg Oral Tablet Take 1 Tablet (20 mg total) by mouth Once a day    furosemide (LASIX) 40 mg Oral Tablet Take 1 Tablet (40 mg total) by mouth Once a day    ipratropium-albuterol 0.5 mg-3 mg(2.5 mg base)/3 mL Solution for Nebulization Take 3 mL by nebulization Four times a day    Levocetirizine (XYZAL) 5 mg Oral Tablet Take 1 Tablet (5 mg total) by mouth Every evening    omeprazole (PRILOSEC) 20 mg Oral Capsule, Delayed Release(E.C.) Take 1 Capsule (20 mg total) by mouth Once a day    ondansetron (ZOFRAN ODT) 4 mg Oral Tablet, Rapid Dissolve Take 1 Tablet (4 mg total) by mouth Every 8 hours as needed for Nausea/Vomiting    OXYGEN-AIR DELIVERY SYSTEMS DEACT 3L NC at home    polyethylene glycol (MIRALAX) 17 gram Oral Powder in Packet Take 2 Packets (34 g total) by mouth Twice daily    potassium chloride (K-DUR) 20 mEq Oral Tab Sust.Rel. Particle/Crystal Take 1 Tablet (20 mEq total) by mouth Once a day    predniSONE (DELTASONE) 10 mg Oral Tablet Take 30mg  daily on 12/7 then 20mg   daily for 3 days then 10mg  daily for 3 days. End 12/13    sennosides-docusate sodium (SENOKOT-S) 8.6-50 mg Oral Tablet Take 2 Tablets by mouth Twice daily       Allergies:  Allergies   Allergen Reactions    Hydrocodone Nausea/ Vomiting       Physical Exam     Vitals:    BP (!) 120/44   Pulse 61   Temp 37.1 C (98.7 F)   Resp (!) 24   Ht 1.626 m (5\' 4" )   Wt 77.1 kg (170 lb)   SpO2 95%   BMI 29.18 kg/m           Physical Exam  Vitals and nursing note reviewed.   Constitutional:       General: She is not in acute distress.     Appearance: She is well-developed.   HENT:      Head: Normocephalic and atraumatic.      Right Ear: Tympanic membrane normal.      Left Ear: Tympanic membrane normal.      Nose: Nose normal.      Mouth/Throat:      Mouth: Mucous membranes are moist.   Eyes:      Conjunctiva/sclera: Conjunctivae normal.      Pupils: Pupils are equal, round, and reactive to light.   Cardiovascular:      Rate and Rhythm: Normal rate and regular rhythm.      Heart sounds: No murmur heard.  Pulmonary:      Effort: Pulmonary effort is normal. No respiratory distress.      Breath sounds: Normal breath sounds.   Abdominal:      General: Bowel sounds are normal.      Palpations: Abdomen is soft.      Tenderness: There is no abdominal tenderness.   Musculoskeletal:         General: No swelling.      Cervical back: Normal range of motion and neck supple.   Skin:     General: Skin is warm and dry.      Capillary Refill: Capillary refill takes less than 2 seconds.   Neurological:      Mental Status:  She is alert and oriented to person, place, and time.   Psychiatric:         Mood and Affect: Mood normal.         Diagnostic Studies/Treatment     Medications:  Medications Administered in the ED       New Prescriptions    No medications on file       Labs:    Results for orders placed or performed during the hospital encounter of 06/30/22 (from the past 12 hour(s))   LACTIC ACID LEVEL W/ REFLEX FOR LEVEL >2.0   Result  Value Ref Range    LACTIC ACID 0.9 0.4 - 2.0 mmol/L   MAGNESIUM   Result Value Ref Range    MAGNESIUM 1.9 1.8 - 2.4 mg/dL   NT-PROBNP   Result Value Ref Range    NT-PROBNP 799 (H) <=125 pg/mL   COMPREHENSIVE METABOLIC PANEL, NON-FASTING   Result Value Ref Range    SODIUM 140 136 - 145 mmol/L    POTASSIUM 4.2 3.5 - 5.1 mmol/L    CHLORIDE 103 98 - 107 mmol/L    CO2 TOTAL 32 21 - 32 mmol/L    ANION GAP 5 4 - 13 mmol/L    BUN 14 7 - 18 mg/dL    CREATININE 3.33 5.45 - 1.02 mg/dL    BUN/CREA RATIO 17     ESTIMATED GFR 77 >59 mL/min/1.33m^2    ALBUMIN 3.1 (L) 3.4 - 5.0 g/dL    CALCIUM 9.3 8.5 - 62.5 mg/dL    GLUCOSE 638 74 - 937 mg/dL    ALKALINE PHOSPHATASE 82 46 - 116 U/L    ALT (SGPT) 12 <=78 U/L    AST (SGOT) 12 (L) 15 - 37 U/L    BILIRUBIN TOTAL 0.4 0.2 - 1.0 mg/dL    PROTEIN TOTAL 6.6 6.4 - 8.2 g/dL    ALBUMIN/GLOBULIN RATIO 0.9 0.8 - 1.4    OSMOLALITY, CALCULATED 280 270 - 290 mOsm/kg    CALCIUM, CORRECTED 10.0 mg/dL    GLOBULIN 3.5    PT/INR   Result Value Ref Range    PROTHROMBIN TIME 12.2 9.8 - 12.7 seconds    INR 1.05 0.88 - 1.10   TROPONIN-I   Result Value Ref Range    TROPONIN I 7 <15 ng/L        Radiology:  XR CHEST AP    XR CHEST AP   Final Result   Pulmonary interstitial edema      Small right pleural effusion         Radiologist location ID: [id number]             ECG:               Differential diagnosis  1. COPD EXACERBATION 2. COVID-19 INFECTION 3. CONGESTIVE HEART FAILURE 4.  RESPIRATORY FAILURE WITH HYPOXIA     Course/Disposition/Plan     Course:     ED Course as of 06/30/22 2247   Thu Jun 30, 2022   2246 Patient with COPD exacerbation mild CHF   PATIENT ON 3 L NASAL CANNULA PROGRESSIVE SHORT OF BREATH CHEST TIGHTNESS WORSE WITH AMBULATION FOR THE PAST 2-3 DAYS.  DENIES ANY FEVER OR CHILLS NO COVID EXPOSURE.  PATIENT WILL BE WORKED UP FOR PNEUMONIA COPD EXACERBATION    Disposition:    Admitted    Condition at Disposition:     STABLE  Follow up:   No follow-up provider specified.    Clinical  Impression:  Clinical Impression   COPD with acute exacerbation (CMS HCC) (Primary)   Congestive heart failure, unspecified HF chronicity, unspecified heart failure type (CMS HCC)         Tivis Ringer, MD

## 2022-07-01 ENCOUNTER — Encounter (HOSPITAL_COMMUNITY): Payer: Self-pay | Admitting: HOSPITALIST

## 2022-07-01 ENCOUNTER — Inpatient Hospital Stay (HOSPITAL_COMMUNITY): Payer: 59

## 2022-07-01 DIAGNOSIS — J441 Chronic obstructive pulmonary disease with (acute) exacerbation: Secondary | ICD-10-CM | POA: Diagnosis present

## 2022-07-01 DIAGNOSIS — I509 Heart failure, unspecified: Secondary | ICD-10-CM

## 2022-07-01 DIAGNOSIS — J9621 Acute and chronic respiratory failure with hypoxia: Secondary | ICD-10-CM

## 2022-07-01 DIAGNOSIS — R0600 Dyspnea, unspecified: Secondary | ICD-10-CM

## 2022-07-01 DIAGNOSIS — J9601 Acute respiratory failure with hypoxia: Secondary | ICD-10-CM

## 2022-07-01 DIAGNOSIS — F17210 Nicotine dependence, cigarettes, uncomplicated: Secondary | ICD-10-CM

## 2022-07-01 LAB — CBC WITH DIFF
BASOPHIL #: 0 10*3/uL (ref 0.00–0.10)
BASOPHIL %: 0 % (ref 0–1)
EOSINOPHIL #: 0 10*3/uL (ref 0.00–0.50)
EOSINOPHIL %: 0 % — ABNORMAL LOW
HCT: 34.5 % (ref 31.2–41.9)
HGB: 11.5 g/dL (ref 10.9–14.3)
LYMPHOCYTE #: 0.4 10*3/uL — ABNORMAL LOW (ref 1.00–3.00)
LYMPHOCYTE %: 7 % — ABNORMAL LOW (ref 16–44)
MCH: 29.4 pg (ref 24.7–32.8)
MCHC: 33.4 g/dL (ref 32.3–35.6)
MCV: 87.9 fL (ref 75.5–95.3)
MONOCYTE #: 0.1 10*3/uL — ABNORMAL LOW (ref 0.30–1.00)
MONOCYTE %: 1 % — ABNORMAL LOW (ref 5–13)
MPV: 10.1 fL (ref 7.9–10.8)
NEUTROPHIL #: 5.2 10*3/uL (ref 1.85–7.80)
NEUTROPHIL %: 92 % — ABNORMAL HIGH (ref 43–77)
PLATELET COMMENT: ADEQUATE
PLATELETS: 135 10*3/uL — ABNORMAL LOW (ref 140–440)
RBC: 3.93 10*6/uL (ref 3.63–4.92)
RDW: 15.3 % (ref 12.3–17.7)
WBC: 5.7 10*3/uL (ref 3.8–11.8)

## 2022-07-01 LAB — BASIC METABOLIC PANEL
ANION GAP: 7 mmol/L (ref 4–13)
BUN/CREA RATIO: 18 (ref 6–22)
BUN: 13 mg/dL (ref 7–25)
CALCIUM: 9.1 mg/dL (ref 8.6–10.3)
CHLORIDE: 103 mmol/L (ref 98–107)
CO2 TOTAL: 29 mmol/L (ref 21–31)
CREATININE: 0.73 mg/dL (ref 0.60–1.30)
ESTIMATED GFR: 87 mL/min/{1.73_m2} (ref >59)
GLUCOSE: 179 mg/dL — ABNORMAL HIGH (ref 74–109)
OSMOLALITY, CALCULATED: 282 mOsm/kg (ref 270–290)
POTASSIUM: 4.1 mmol/L (ref 3.5–5.1)
SODIUM: 139 mmol/L (ref 136–145)

## 2022-07-01 LAB — HGA1C (HEMOGLOBIN A1C WITH EST AVG GLUCOSE): HEMOGLOBIN A1C: 5.3 % (ref 4.0–6.0)

## 2022-07-01 LAB — LIPID PANEL
CHOL/HDL RATIO: 2.7
CHOLESTEROL: 100 mg/dL (ref <200)
HDL CHOL: 37 mg/dL (ref 23–92)
LDL CALC: 54 mg/dL (ref 0–100)
TRIGLYCERIDES: 43 mg/dL (ref <=150)
VLDL CALC: 9 mg/dL (ref 0–50)

## 2022-07-01 LAB — TROPONIN-I: TROPONIN I: 5 ng/L (ref <15)

## 2022-07-01 MED ORDER — ETHYL ALCOHOL 62 % (NOZIN NASAL SANITIZER) NASAL SOLUTION - BULK BOTTLE
1.0000 | Freq: Two times a day (BID) | NASAL | Status: DC
Start: 2022-07-01 — End: 2022-07-02
  Administered 2022-07-01 – 2022-07-02 (×3): 1 via NASAL

## 2022-07-01 MED ORDER — ALUMINUM-MAG HYDROXIDE-SIMETHICONE 200 MG-200 MG-20 MG/5 ML ORAL SUSP
15.0000 mL | Freq: Four times a day (QID) | ORAL | Status: DC | PRN
Start: 2022-07-01 — End: 2022-07-02

## 2022-07-01 MED ORDER — ZOLPIDEM 5 MG TABLET
5.0000 mg | ORAL_TABLET | Freq: Every evening | ORAL | Status: DC | PRN
Start: 2022-07-01 — End: 2022-07-02

## 2022-07-01 MED ORDER — METHYLPREDNISOLONE SOD SUCCINATE 40 MG/ML SOLUTION FOR INJ. WRAPPER
40.0000 mg | Freq: Three times a day (TID) | INTRAMUSCULAR | Status: DC
Start: 2022-07-01 — End: 2022-07-02
  Administered 2022-07-01 (×3): 40 mg via INTRAVENOUS
  Administered 2022-07-02: 0 mg via INTRAVENOUS
  Administered 2022-07-02: 40 mg via INTRAVENOUS
  Filled 2022-07-01 (×4): qty 1

## 2022-07-01 MED ORDER — HYDRALAZINE 20 MG/ML INJECTION SOLUTION
10.0000 mg | INTRAMUSCULAR | Status: DC | PRN
Start: 2022-07-01 — End: 2022-07-02

## 2022-07-01 MED ORDER — LORAZEPAM 2 MG/ML INJECTION WRAPPER
0.5000 mg | INTRAMUSCULAR | Status: DC | PRN
Start: 2022-07-01 — End: 2022-07-02

## 2022-07-01 MED ORDER — ALBUTEROL SULFATE 2.5 MG/3 ML (0.083 %) SOLUTION FOR NEBULIZATION
2.5000 mg | INHALATION_SOLUTION | RESPIRATORY_TRACT | Status: DC | PRN
Start: 2022-07-01 — End: 2022-07-02

## 2022-07-01 MED ORDER — ASPIRIN 81 MG CHEWABLE TABLET
81.0000 mg | CHEWABLE_TABLET | Freq: Every day | ORAL | Status: DC
Start: 2022-07-01 — End: 2022-07-02
  Administered 2022-07-01 – 2022-07-02 (×2): 81 mg via ORAL
  Filled 2022-07-01 (×2): qty 1

## 2022-07-01 MED ORDER — GUAIFENESIN 100 MG/5 ML ORAL LIQUID
200.0000 mg | ORAL | Status: DC | PRN
Start: 2022-07-01 — End: 2022-07-02

## 2022-07-01 MED ORDER — ENOXAPARIN 40 MG/0.4 ML SUBCUTANEOUS SYRINGE
40.0000 mg | INJECTION | SUBCUTANEOUS | Status: DC
Start: 2022-07-01 — End: 2022-07-02
  Administered 2022-07-01 – 2022-07-02 (×2): 40 mg via SUBCUTANEOUS
  Filled 2022-07-01 (×2): qty 0.4

## 2022-07-01 MED ORDER — ATORVASTATIN 40 MG TABLET
40.0000 mg | ORAL_TABLET | Freq: Every evening | ORAL | Status: DC
Start: 2022-07-01 — End: 2022-07-02
  Administered 2022-07-01 (×2): 40 mg via ORAL
  Filled 2022-07-01 (×2): qty 1

## 2022-07-01 MED ORDER — ONDANSETRON HCL (PF) 4 MG/2 ML INJECTION SOLUTION
4.0000 mg | Freq: Three times a day (TID) | INTRAMUSCULAR | Status: DC | PRN
Start: 2022-07-01 — End: 2022-07-02

## 2022-07-01 MED ORDER — IPRATROPIUM 0.5 MG-ALBUTEROL 3 MG (2.5 MG BASE)/3 ML NEBULIZATION SOLN
INHALATION_SOLUTION | RESPIRATORY_TRACT | Status: AC
Start: 2022-07-01 — End: 2022-07-01
  Filled 2022-07-01: qty 3

## 2022-07-01 MED ORDER — PANTOPRAZOLE 40 MG TABLET,DELAYED RELEASE
40.0000 mg | DELAYED_RELEASE_TABLET | Freq: Every day | ORAL | Status: DC
Start: 2022-07-01 — End: 2022-07-02
  Administered 2022-07-01 – 2022-07-02 (×2): 40 mg via ORAL
  Filled 2022-07-01 (×2): qty 1

## 2022-07-01 MED ORDER — GUAIFENESIN ER 600 MG TABLET, EXTENDED RELEASE 12 HR
600.0000 mg | EXTENDED_RELEASE_TABLET | Freq: Two times a day (BID) | ORAL | Status: DC
Start: 2022-07-01 — End: 2022-07-02
  Administered 2022-07-01 – 2022-07-02 (×3): 600 mg via ORAL
  Filled 2022-07-01 (×3): qty 1

## 2022-07-01 MED ORDER — NITROGLYCERIN 0.4 MG SUBLINGUAL TABLET
0.4000 mg | SUBLINGUAL_TABLET | SUBLINGUAL | Status: DC | PRN
Start: 2022-07-01 — End: 2022-07-02

## 2022-07-01 MED ORDER — CITALOPRAM 20 MG TABLET
20.0000 mg | ORAL_TABLET | Freq: Every day | ORAL | Status: DC
Start: 2022-07-01 — End: 2022-07-02
  Administered 2022-07-01 – 2022-07-02 (×2): 20 mg via ORAL
  Filled 2022-07-01 (×2): qty 1

## 2022-07-01 MED ORDER — FUROSEMIDE 10 MG/ML INJECTION SOLUTION
20.0000 mg | Freq: Two times a day (BID) | INTRAMUSCULAR | Status: DC
Start: 2022-07-01 — End: 2022-07-02
  Administered 2022-07-01 – 2022-07-02 (×3): 20 mg via INTRAVENOUS
  Filled 2022-07-01 (×3): qty 4

## 2022-07-01 MED ORDER — OXYCODONE 5 MG TABLET
5.0000 mg | ORAL_TABLET | ORAL | Status: DC | PRN
Start: 2022-07-01 — End: 2022-07-02

## 2022-07-01 MED ORDER — POLYETHYLENE GLYCOL 3350 17 GRAM ORAL POWDER PACKET
17.0000 g | Freq: Every day | ORAL | Status: DC | PRN
Start: 2022-07-01 — End: 2022-07-02

## 2022-07-01 MED ORDER — ACETAMINOPHEN 325 MG TABLET
650.0000 mg | ORAL_TABLET | ORAL | Status: DC | PRN
Start: 2022-07-01 — End: 2022-07-02
  Administered 2022-07-01: 650 mg via ORAL
  Filled 2022-07-01: qty 2

## 2022-07-01 MED ORDER — IPRATROPIUM 0.5 MG-ALBUTEROL 3 MG (2.5 MG BASE)/3 ML NEBULIZATION SOLN
3.0000 mL | INHALATION_SOLUTION | RESPIRATORY_TRACT | Status: DC
Start: 2022-07-01 — End: 2022-07-02
  Administered 2022-07-01: 0 mL via RESPIRATORY_TRACT
  Administered 2022-07-01 – 2022-07-02 (×4): 3 mL via RESPIRATORY_TRACT
  Administered 2022-07-02: 0 mL via RESPIRATORY_TRACT
  Filled 2022-07-01: qty 3

## 2022-07-01 MED ORDER — DIPHENHYDRAMINE 50 MG/ML INJECTION SOLUTION
25.0000 mg | Freq: Four times a day (QID) | INTRAMUSCULAR | Status: DC | PRN
Start: 2022-07-01 — End: 2022-07-02

## 2022-07-01 MED ORDER — BUDESONIDE 0.5 MG/2 ML SUSPENSION FOR NEBULIZATION
0.5000 mg | INHALATION_SUSPENSION | Freq: Two times a day (BID) | RESPIRATORY_TRACT | Status: DC
Start: 2022-07-01 — End: 2022-07-02
  Administered 2022-07-01 – 2022-07-02 (×3): 0.5 mg via RESPIRATORY_TRACT
  Filled 2022-07-01 (×2): qty 2

## 2022-07-01 MED ORDER — IPRATROPIUM 0.5 MG-ALBUTEROL 3 MG (2.5 MG BASE)/3 ML NEBULIZATION SOLN
3.0000 mL | INHALATION_SOLUTION | Freq: Four times a day (QID) | RESPIRATORY_TRACT | Status: DC
Start: 2022-07-01 — End: 2022-07-01
  Administered 2022-07-01: 3 mL via RESPIRATORY_TRACT
  Administered 2022-07-01: 0 mL via RESPIRATORY_TRACT
  Filled 2022-07-01: qty 3

## 2022-07-01 MED ORDER — SODIUM CHLORIDE 0.9 % INTRAVENOUS PIGGYBACK
100.0000 mg | Freq: Two times a day (BID) | INTRAVENOUS | Status: DC
Start: 2022-07-01 — End: 2022-07-02
  Administered 2022-07-01 (×2): 0 mg via INTRAVENOUS
  Administered 2022-07-01 (×2): 100 mg via INTRAVENOUS
  Administered 2022-07-02: 0 mg via INTRAVENOUS
  Administered 2022-07-02: 100 mg via INTRAVENOUS
  Filled 2022-07-01 (×3): qty 10

## 2022-07-01 MED ORDER — SODIUM CHLORIDE 0.9 % INJECTION SOLUTION
2.0000 mL | Freq: Once | INTRAVENOUS | Status: DC | PRN
Start: 2022-07-01 — End: 2022-07-02
  Administered 2022-07-01: 5 mL via INTRAVENOUS

## 2022-07-01 MED ORDER — MORPHINE 2 MG/ML INJECTION WRAPPER
2.0000 mg | INJECTION | INTRAMUSCULAR | Status: DC | PRN
Start: 2022-07-01 — End: 2022-07-02

## 2022-07-01 MED ORDER — NICOTINE 14 MG/24 HR DAILY TRANSDERMAL PATCH
14.0000 mg | MEDICATED_PATCH | Freq: Every day | TRANSDERMAL | Status: DC
Start: 2022-07-01 — End: 2022-07-02
  Administered 2022-07-01 – 2022-07-02 (×2): 14 mg via TRANSDERMAL
  Filled 2022-07-01 (×2): qty 1

## 2022-07-01 NOTE — ED Nurses Note (Signed)
Oxygen at 3 liters via nasal cannula. Resp even and non labored. Equal chest rise and fall. Call bell in reach. Cardiac monitor in use.

## 2022-07-01 NOTE — ED Nurses Note (Signed)
[facility] does not have beds.  [facility] @0025  they have no beds.

## 2022-07-01 NOTE — ED Nurses Note (Signed)
Called report and spoke to [place]. Advised that [facility] to transport. Pt alert and oriented. Remains on oxygen at 3 liters via nasal cannula. Foley draining to gravity with no difficulty. Resp even and non labored. Equal chest rise and fall.

## 2022-07-01 NOTE — Progress Notes (Signed)
[facility]   IP PROGRESS NOTE      Misty Johnson, Misty Johnson  Date of Admission:  06/30/2022  Date of Birth:  03-Nov-1950  Date of Service:  07/01/2022    Hospital Day:  LOS: 0 days     HPI:  Kehaulani Johnson is a 71 y.o. female who is being seen in f/u for COPDAE, CHF. Patient continues to spoke 1/2 ppd. She does not want to quit.  Has been ordered a patch. Echo is pending. States she feels about the same.  Has conversational dyspnea.  Swelling better.     Review of Systems:  Please refer to HPI    Vital Signs:  Temp (24hrs) Max:37.1 C (98.7 F)      Temperature: 36.6 C (97.8 F)  BP (Non-Invasive): (!) 134/50  MAP (Non-Invasive): 69 mmHG  Heart Rate: 71  Respiratory Rate: (!) 22  SpO2: 92 %    Physical Examination  General: Patient is alert and oriented to person, place, and time. No acute distress. Communicates appropriately.   Head: Normocephalic and atraumatic.    Eyes: Pupils equally round and react to light and accommodate. Extraocular movements intact.  Conjunctiva normal. Sclerae are normal.      Neck: Supple.  Trachea midline   Heart: Regular rate and rhythm.    Lungs: Wheezing throughout . Equal chest excursion.  + conversational dyspnea. No respiratory distress noted.   Abdomen: Soft, nontender, nondistended belly. Bowel sounds are present .   Extremities: No edema, cyanosis    Skin: Warm    Neurologic: nonfocal, speech intact   Psychiatric: Judgment and insight are intact. Mood and affect are appropriate for the situation.    Current Medications:  acetaminophen (TYLENOL) tablet, 650 mg, Oral, Q4H PRN  albuterol (PROVENTIL) 2.5 mg / 3 mL (0.083%) neb solution, 2.5 mg, Nebulization, Q4H PRN  alcohol 62 % (NOZIN NASAL SANITIZER) nasal solution, 1 Each, Each Nostril, 2x/day  aluminum-magnesium hydroxide-simethicone (MAG-AL PLUS) 200-200-20 mg per 5 mL oral liquid, 15 mL, Oral, 4x/day PRN  aspirin chewable tablet 81 mg, 81 mg, Oral, Daily  atorvastatin (LIPITOR) tablet, 40 mg, Oral, QPM  budesonide  (PULMICORT RESPULES) 0.5 mg/2 mL nebulizer suspension, 0.5 mg, Nebulization, 2x/day  citalopram (CeleXA) tablet, 20 mg, Oral, Daily  diphenhydrAMINE (BENADRYL) 50 mg/mL injection, 25 mg, Intravenous, Q6H PRN  doxycycline hyclate 100 mg in NS 100 mL IVPB minibag, 100 mg, Intravenous, Q12H  enoxaparin PF (LOVENOX) 40 mg/0.4 mL SubQ injection, 40 mg, Subcutaneous, Q24H  furosemide (LASIX) 10 mg/mL injection, 20 mg, Intravenous, 2x/day  guaiFENesin (MUCINEX) extended release tablet - for cough (expectorant), 600 mg, Oral, 2x/day  guaiFENesin 100mg  per 19mL oral liquid - for cough (expectorant), 200 mg, Oral, Q4H PRN  hydrALAZINE (APRESOLINE) injection 10 mg, 10 mg, Intravenous, Q4H PRN  ipratropium-albuterol 0.5 mg-3 mg(2.5 mg base)/3 mL Solution for Nebulization, 3 mL, Nebulization, 4x/day  LORazepam (ATIVAN) 2 mg/mL injection, 0.5 mg, Intravenous, Q4H PRN  methylPREDNISolone sod succ (SOLU-medrol) 40 mg/mL injection, 40 mg, Intravenous, Q8H  morphine 2 mg/mL injection, 2 mg, Intravenous, Q4H PRN  nicotine (NICODERM CQ) transdermal patch (mg/24 hr), 14 mg, Transdermal, Daily  nitroGLYCERIN (NITROSTAT) sublingual tablet, 0.4 mg, Sublingual, Q5 Min PRN  ondansetron (ZOFRAN) 2 mg/mL injection, 4 mg, Intravenous, Q8H PRN  oxyCODONE (ROXICODONE) immediate release tablet, 5 mg, Oral, Q4H PRN  pantoprazole (PROTONIX) delayed release tablet, 40 mg, Oral, Daily  polyethylene glycol (MIRALAX) oral packet, 17 g, Oral, Daily PRN  zolpidem (AMBIEN) tablet, 5 mg, Oral, HS PRN  Current Orders:  Active Orders   Microbiology    RESPIRATORY CULTURE AND GRAM STAIN, AEROBIC - SPUTUM     Frequency: ONE TIME     Number of Occurrences: 1 Occurrences    SPUTUM SCREEN     Frequency: Once     Number of Occurrences: 1 Occurrences   Lab    BASIC METABOLIC PANEL, NON-FASTING     Frequency: 0530 - AM DRAW     Number of Occurrences: 1 Occurrences    CBC/DIFF     Frequency: 0530 - AM DRAW     Number of Occurrences: 1 Occurrences    MAGNESIUM      Frequency: 0530 - AM DRAW     Number of Occurrences: 1 Occurrences   Diet    DIET DIABETIC Calorie amount: CC 2000; Do you want to initiate MNT Protocol? Yes     Frequency: All Meals     Number of Occurrences: 1 Occurrences   Nursing    ACTIVITY Activity: AS TOLERATED; Instructions: WITH ASSIST     Frequency: UNTIL DISCONTINUED     Number of Occurrences: Until Specified    FOLEY CATHETER INSERT & MAINTAIN     Frequency: UNTIL DISCONTINUED     Number of Occurrences: Until Specified    INTAKE AND OUTPUT QSHIFT     Frequency: QSHIFT     Number of Occurrences: Until Specified    Notify MD Vital Signs     Frequency: PRN     Number of Occurrences: Until Specified    PT IS INTERMEDIATE RISK FOR VENOUS THROMBOEMBOLISM     Frequency: CONTINUOUS     Number of Occurrences: Until Specified    PULSE OXIMETRY Q4H     Frequency: Q4H     Number of Occurrences: Until Specified    TELEMETRY MONITORING X 48H     Frequency: CONTINUOUS X 48 HRS     Number of Occurrences: 48 Hours    VITAL SIGNS  Q4H     Frequency: Q4H     Number of Occurrences: Until Specified    WEIGH PATIENT     Frequency: DAILY (0600)     Number of Occurrences: Until Specified   Code Status    FULL CODE     Frequency: CONTINUOUS     Number of Occurrences: Until Specified   PT    PT EVALUATE AND TREAT     Frequency: Per Therapist Discretion     Number of Occurrences: 1 Occurrences     Order Comments: If patient's O2 level drops, PT may increase O2 until sats are > 93%.       Scheduling Instructions:               ECHO    TRANSTHORACIC ECHOCARDIOGRAM - ADULT     Frequency: ONE TIME     Number of Occurrences: 1 Occurrences     Scheduling Instructions:               Medications    acetaminophen (TYLENOL) tablet     Frequency: Q4H PRN     Dose: 650 mg     Route: Oral    albuterol (PROVENTIL) 2.5 mg / 3 mL (0.083%) neb solution     Frequency: Q4H PRN     Dose: 2.5 mg     Route: Nebulization    alcohol 62 % (NOZIN NASAL SANITIZER) nasal solution     Frequency: 2x/day      Dose: 1 Each  Route: Each Nostril    aluminum-magnesium hydroxide-simethicone (MAG-AL PLUS) 200-200-20 mg per 5 mL oral liquid     Frequency: 4x/day PRN     Dose: 15 mL     Route: Oral    aspirin chewable tablet 81 mg     Frequency: Daily     Dose: 81 mg     Route: Oral    atorvastatin (LIPITOR) tablet     Frequency: QPM     Dose: 40 mg     Route: Oral    budesonide (PULMICORT RESPULES) 0.5 mg/2 mL nebulizer suspension     Frequency: 2x/day     Dose: 0.5 mg     Route: Nebulization    citalopram (CeleXA) tablet     Frequency: Daily     Dose: 20 mg     Route: Oral    diphenhydrAMINE (BENADRYL) 50 mg/mL injection     Frequency: Q6H PRN     Dose: 25 mg     Route: Intravenous    doxycycline hyclate 100 mg in NS 100 mL IVPB minibag     Frequency: Q12H     Dose: 100 mg     Route: Intravenous    enoxaparin PF (LOVENOX) 40 mg/0.4 mL SubQ injection     Frequency: Q24H     Dose: 40 mg     Route: Subcutaneous    furosemide (LASIX) 10 mg/mL injection     Frequency: 2x/day     Dose: 20 mg     Route: Intravenous    guaiFENesin (MUCINEX) extended release tablet - for cough (expectorant)     Frequency: 2x/day     Dose: 600 mg     Route: Oral    guaiFENesin 100mg  per 26mL oral liquid - for cough (expectorant)     Frequency: Q4H PRN     Dose: 200 mg     Route: Oral    hydrALAZINE (APRESOLINE) injection 10 mg     Frequency: Q4H PRN     Dose: 10 mg     Route: Intravenous    ipratropium-albuterol 0.5 mg-3 mg(2.5 mg base)/3 mL Solution for Nebulization     Frequency: 4x/day     Dose: 3 mL     Route: Nebulization    LORazepam (ATIVAN) 2 mg/mL injection     Frequency: Q4H PRN     Dose: 0.5 mg     Route: Intravenous    methylPREDNISolone sod succ (SOLU-medrol) 40 mg/mL injection     Frequency: Q8H     Dose: 40 mg     Route: Intravenous    morphine 2 mg/mL injection     Frequency: Q4H PRN     Dose: 2 mg     Route: Intravenous    nicotine (NICODERM CQ) transdermal patch (mg/24 hr)     Frequency: Daily     Dose: 14 mg     Route: Transdermal     nitroGLYCERIN (NITROSTAT) sublingual tablet     Frequency: Q5 Min PRN     Dose: 0.4 mg     Route: Sublingual    ondansetron (ZOFRAN) 2 mg/mL injection     Frequency: Q8H PRN     Dose: 4 mg     Route: Intravenous    oxyCODONE (ROXICODONE) immediate release tablet     Frequency: Q4H PRN     Dose: 5 mg     Route: Oral    pantoprazole (PROTONIX) delayed release tablet     Frequency: Daily  Dose: 40 mg     Route: Oral    polyethylene glycol (MIRALAX) oral packet     Frequency: Daily PRN     Dose: 17 g     Route: Oral    zolpidem (AMBIEN) tablet     Frequency: HS PRN     Dose: 5 mg     Route: Oral        Consults: none    I/O:  I/O last 24 hours:    Intake/Output Summary (Last 24 hours) at 07/01/2022 1156  Last data filed at 07/01/2022 1047  Gross per 24 hour   Intake 420 ml   Output 700 ml   Net -280 ml     I/O current shift:  12/29 0700 - 12/29 1859  In: 320 [P.O.:220]  Out: -       Labs (Please indicate ordered or reviewed)  Reviewed: Lab Results Today:   Results for orders placed or performed during the hospital encounter of 06/30/22 (from the past 24 hour(s))   ECG 12 LEAD   Result Value Ref Range    Ventricular rate 67 BPM    Atrial Rate 67 BPM    PR Interval 114 ms    QRS Duration 82 ms    QT Interval 388 ms    QTC Calculation 409 ms    Calculated P Axis 2 degrees    Calculated R Axis 45 degrees    Calculated T Axis 105 degrees   LACTIC ACID LEVEL W/ REFLEX FOR LEVEL >2.0   Result Value Ref Range    LACTIC ACID 0.9 0.4 - 2.0 mmol/L   MAGNESIUM   Result Value Ref Range    MAGNESIUM 1.9 1.8 - 2.4 mg/dL   [id number], FLU A/B, RSV RAPID BY PCR   Result Value Ref Range    SARS-CoV-2 Not Detected Not Detected    INFLUENZA VIRUS TYPE A Not Detected Not Detected    INFLUENZA VIRUS TYPE B Not Detected Not Detected    RESPIRATORY SYNCTIAL VIRUS (RSV) Not Detected Not Detected   NT-PROBNP   Result Value Ref Range    NT-PROBNP 799 (H) <=125 pg/mL   COMPREHENSIVE METABOLIC PANEL, NON-FASTING   Result Value Ref Range     SODIUM 140 136 - 145 mmol/L    POTASSIUM 4.2 3.5 - 5.1 mmol/L    CHLORIDE 103 98 - 107 mmol/L    CO2 TOTAL 32 21 - 32 mmol/L    ANION GAP 5 4 - 13 mmol/L    BUN 14 7 - 18 mg/dL    CREATININE 2.13 0.86 - 1.02 mg/dL    BUN/CREA RATIO 17     ESTIMATED GFR 77 >59 mL/min/1.29m^2    ALBUMIN 3.1 (L) 3.4 - 5.0 g/dL    CALCIUM 9.3 8.5 - 57.8 mg/dL    GLUCOSE 469 74 - 629 mg/dL    ALKALINE PHOSPHATASE 82 46 - 116 U/L    ALT (SGPT) 12 <=78 U/L    AST (SGOT) 12 (L) 15 - 37 U/L    BILIRUBIN TOTAL 0.4 0.2 - 1.0 mg/dL    PROTEIN TOTAL 6.6 6.4 - 8.2 g/dL    ALBUMIN/GLOBULIN RATIO 0.9 0.8 - 1.4    OSMOLALITY, CALCULATED 280 270 - 290 mOsm/kg    CALCIUM, CORRECTED 10.0 mg/dL    GLOBULIN 3.5    CBC WITH DIFF   Result Value Ref Range    WBC 6.4 4.0 - 10.5 x10^3/uL    RBC 4.02 (L) 4.20 - 5.40  x10^6/uL    HGB 11.6 (L) 12.5 - 16.0 g/dL    HCT 16.1 09.6 - 04.5 %    MCV 92.6 78.0 - 99.0 fL    MCH 28.9 27.0 - 32.0 pg    MCHC 31.3 (L) 32.0 - 36.0 g/dL    RDW 40.9 (H) 81.1 - 14.8 %    PLATELETS 166 140 - 440 x10^3/uL    MPV 10.8 (H) 7.4 - 10.4 fL   MANUAL DIFFERENTIAL   Result Value Ref Range    WBC 6.4 x10^3/uL    NEUTROPHIL % 71 40 - 76 %    LYMPHOCYTE % 18 (L) 25 - 45 %    MONOCYTE % 4 0 - 12 %    EOSINOPHIL % 4 0 - 7 %    BASOPHIL % 1 0 - 3 %    METAMYELOCYTE %      MYELOCYTE %      PROMYELOCYTE %      BAND % 2 (L) 5 - 11 %    BLAST %      OTHER %      NEUTROPHIL ABSOLUTE 4.67 1.80 - 8.40 x10^3/uL    LYMPHOCYTE ABSOLUTE 1.15 1.10 - 5.00 x10^3/uL    MONOCYTE ABSOLUTE 0.26 0.00 - 1.30 x10^3/uL    EOSINOPHIL ABSOLUTE 0.26 0.00 - 0.80 x10^3/uL    BASOPHIL ABSOLUTE 0.06 0.00 - 0.30 x10^3/uL    METAMYELOCYTE ABSOLUTE      MYELOCYTE ABSOLUTE      PROMYELOCYTE ABSOLUTE      BLAST ABSOLUTE      OTHER CELL ABSOLUTE      ANISOCYTOSIS      POLYCHROMASIA      POIKILOCYTOSIS      BASOPHILIC STIPPLING      MICROCYTOSIS      MACROCYTOSIS      ROULEAUX      SCHISTOCYTES      SPHEROCYTES      TARGET CELLS      TEARDROP CELLS      OVALOCYTE (ELLIPTOCYTE)       CRENATED RED CELLS      STOMATOCYTES      ACANTHOCYTES (SPUR CELL)      ECHINOCYTE (BURR CELL)      BLISTER CELLS      RBC AGGLUTINATES      HOWELL JOLLY BODIES      ATYPICAL LYMPHOCYTES      TOXIC GRANULATION      DOHLE BODIES      TOXIC VACUOLIZATION      AUER RODS      BASKET CELLS      HYPERSEGMENTATION      LARGE PLATELETS      PLATELET CLUMPS      WBC MORPHOLOGY COMMENT      RBC MORPHOLOGY COMMENT Normal     PLATELET MORPHOLOGY COMMENT Adequate     BANDS NEUTROPHILS MANUAL 2     BAND ABSOLUTE      NEUTROPHILS MANUAL 71     LYMPHOCYTES MANUAL 18     MONOCYTES MANUAL 4     EOSINOPHILS MANUAL 4     BASOPHILS MANUAL 1     PROMYELOCYTES MANUAL      MYELOCYTES MANUAL      METAMYELOCYTES MANUAL      BLASTS MANUAL      TOTAL CELLS COUNTED [#] IN BLOOD 100     OTHER CELLS MANUAL      NUCLEATED RBC MANUAL      PLASMA  CELL %      PLASMA CELL ABSOLUE      PLASMA CELLS MANUAL      HYPOCHROMASIA     PT/INR   Result Value Ref Range    PROTHROMBIN TIME 12.2 9.8 - 12.7 seconds    INR 1.05 0.88 - 1.10   TROPONIN-I   Result Value Ref Range    TROPONIN I 7 <15 ng/L   BASIC METABOLIC PANEL   Result Value Ref Range    SODIUM 139 136 - 145 mmol/L    POTASSIUM 4.1 3.5 - 5.1 mmol/L    CHLORIDE 103 98 - 107 mmol/L    CO2 TOTAL 29 21 - 31 mmol/L    ANION GAP 7 4 - 13 mmol/L    CALCIUM 9.1 8.6 - 10.3 mg/dL    GLUCOSE 161179 (H) 74 - 109 mg/dL    BUN 13 7 - 25 mg/dL    CREATININE 0.960.73 0.450.60 - 1.30 mg/dL    BUN/CREA RATIO 18 6 - 22    ESTIMATED GFR 87 >59 mL/min/1.4147m^2    OSMOLALITY, CALCULATED 282 270 - 290 mOsm/kg   TROPONIN-I   Result Value Ref Range    TROPONIN I 5 <15 ng/L   CBC WITH DIFF   Result Value Ref Range    WBC 5.7 3.8 - 11.8 x10^3/uL    RBC 3.93 3.63 - 4.92 x10^6/uL    HGB 11.5 10.9 - 14.3 g/dL    HCT 40.934.5 81.131.2 - 91.441.9 %    MCV 87.9 75.5 - 95.3 fL    MCH 29.4 24.7 - 32.8 pg    MCHC 33.4 32.3 - 35.6 g/dL    RDW 78.215.3 95.612.3 - 21.317.7 %    PLATELETS 135 (L) 140 - 440 x10^3/uL    MPV 10.1 7.9 - 10.8 fL    NEUTROPHIL % 92 (H) 43 - 77 %     LYMPHOCYTE % 7 (L) 16 - 44 %    MONOCYTE % 1 (L) 5 - 13 %    EOSINOPHIL % 0 (L) %    BASOPHIL % 0 0 - 1 %    NEUTROPHIL # 5.20 1.85 - 7.80 x10^3/uL    LYMPHOCYTE # 0.40 (L) 1.00 - 3.00 x10^3/uL    MONOCYTE # 0.10 (L) 0.30 - 1.00 x10^3/uL    EOSINOPHIL # 0.00 0.00 - 0.50 x10^3/uL    BASOPHIL # 0.00 0.00 - 0.10 x10^3/uL    RBC COMMENT Slight Anisocytos     PLATELET COMMENT Adequate    LIPID PANEL   Result Value Ref Range    CHOLESTEROL 100 <200 mg/dL    TRIGLYCERIDES 43 <=086<=150 mg/dL    HDL CHOL 37 23 - 92 mg/dL    LDL CALC 54 0 - 578100 mg/dL    VLDL CALC 9 0 - 50 mg/dL    CHOL/HDL RATIO 2.7    HGA1C (HEMOGLOBIN A1C WITH EST AVG GLUCOSE)   Result Value Ref Range    HEMOGLOBIN A1C 5.3 4.0 - 6.0 %     Ordered:  am labs     Radiology Tests (Please indicate ordered or reviewed)  Reviewed: All imaging studies reviewed   Ordered:  echo pending     Problem List:  Active Hospital Problems   (*Primary Problem)    Diagnosis    *COPD with acute exacerbation (CMS HCC)    Congestive heart failure, unspecified HF chronicity, unspecified heart failure type (CMS HCC)    Dyspnea, unspecified type    Acute respiratory  failure with hypoxia (CMS HCC)         Assessment/ Plan:     COPDAE/Dyspnea/Acute Respiratory Failure with hypoxia   -Neb treatments q 4  -IV solumedrol  -nicotine patch  -O2 via NC 3L   -mucinex   -Doxycycline     CHF  -Lasix   -Echo pending  -monitor electrolytes daily   -monitor daily I and Os       Continue to monitor platelet count on Lovenox.      The Hospitalist personally evaluated and examined the patient in conjunction with the MLP and agree with the assessments, treatment plan and disposition of the patient as recorded by the [facility].      DVT/PE Prophylaxis: Lovenox     Dalante Minus D Nas Wafer, PA-C

## 2022-07-01 NOTE — Care Plan (Signed)
Problem: Adult Inpatient Plan of Care  Goal: Plan of Care Review  Outcome: Ongoing (see interventions/notes)  Goal: Patient-Specific Goal (Individualized)  Outcome: Ongoing (see interventions/notes)  Goal: Absence of Hospital-Acquired Illness or Injury  Outcome: Ongoing (see interventions/notes)  Goal: Optimal Comfort and Wellbeing  Outcome: Ongoing (see interventions/notes)  Goal: Rounds/Family Conference  Outcome: Ongoing (see interventions/notes)     Problem: Health Knowledge, Opportunity to Enhance (Adult,Obstetrics,Pediatric)  Goal: Knowledgeable about Health Subject/Topic  Description: Patient will demonstrate the desired outcomes by discharge/transition of care.  Outcome: Ongoing (see interventions/notes)     Problem: Fall Injury Risk  Goal: Absence of Fall and Fall-Related Injury  Outcome: Ongoing (see interventions/notes)     Problem: Gas Exchange Impaired  Goal: Optimal Gas Exchange  Outcome: Ongoing (see interventions/notes)

## 2022-07-01 NOTE — H&P (Signed)
[facility]    HOSPITALIST H&P    Misty Johnson 71 y.o. female 315/B   Date of Service: 07/01/2022    Date of Admission:  06/30/2022   PCP: Stephenie Acres, MD Code Status:Full Code       Chief Complaint:  Short of breath     HPI:   This 71 year old female is admitted through the service of the ED. Patient presents emergency department with complaints of increasing shortness of breath over the last several days.  Patient does have history of COPD.  Patient has long history of least 1 pack-a-day cigarette smoking.  Patient is now smoking about half a pack a day.  Patient does follow with pulmonology and [facility].  Patient is normally on 3 L oxygen at home.  Patient remains on 3 L at 90-91% at time examination.    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     Enlarged heart     LVH (left ventricular hypertrophy)     Major depression     PSHx:   Past Surgical History:   Procedure Laterality Date    NO PAST SURGERIES         Allergies:    Allergies   Allergen Reactions    Hydrocodone Nausea/ Vomiting    Social History  Social History     Tobacco Use    Smoking status: Every Day     Packs/day: .5     Types: Cigarettes    Smokeless tobacco: Never   Vaping Use    Vaping Use: Never used   Substance Use Topics    Alcohol use: Not Currently    Drug use: Not Currently       Family History  Family Medical History:       Problem Relation (Age of Onset)    No Known Problems Mother, Father               Home Meds:      Prior to Admission medications    Medication Sig Start Date End  Date Taking? Authorizing Provider   albuterol sulfate (PROVENTIL) 2.5 mg /3 mL (0.083 %) Inhalation nebulizer solution Take 3 mL (2.5 mg total) by nebulization Once per day as needed for Wheezing    Provider, Historical   aspirin 81 mg Oral Tablet, Chewable Chew 1 Tablet (81 mg total) Once a day    Provider, Historical   atorvastatin (LIPITOR) 40 mg Oral Tablet Take 1 Tablet (40 mg total) by mouth Every evening    Provider, Historical   azithromycin (ZITHROMAX) 500 mg Oral Tablet Take 1 Tablet (500 mg total) by mouth Every 24 hours 12/17/21   Mamie Levers, MD   budesonide/glycopyr/formoterol (BREZTRI AEROSPHERE INHL) Take by inhalation Once a day    Provider, Historical   ergocalciferol, vitamin D2, (DRISDOL) 1,250 mcg (50,000 unit) Oral Capsule Take 1 Capsule (50,000 Units total) by mouth Every 7 days  Provider, Historical   escitalopram oxalate (LEXAPRO) 20 mg Oral Tablet Take 1 Tablet (20 mg total) by mouth Once a day    Provider, Historical   furosemide (LASIX) 40 mg Oral Tablet Take 1 Tablet (40 mg total) by mouth Once a day    Provider, Historical   ipratropium-albuterol 0.5 mg-3 mg(2.5 mg base)/3 mL Solution for Nebulization Take 3 mL by nebulization Four times a day 06/08/21   Murtis Sink, MD   Levocetirizine (XYZAL) 5 mg Oral Tablet Take 1 Tablet (5 mg total) by mouth Every evening    Provider, Historical   omeprazole (PRILOSEC) 20 mg Oral Capsule, Delayed Release(E.C.) Take 1 Capsule (20 mg total) by mouth Once a day    Provider, Historical   ondansetron (ZOFRAN ODT) 4 mg Oral Tablet, Rapid Dissolve Take 1 Tablet (4 mg total) by mouth Every 8 hours as needed for Nausea/Vomiting 12/17/21   Mamie Levers, MD   OXYGEN-AIR DELIVERY SYSTEMS DEACT 3L NC at home    Provider, Historical   polyethylene glycol (MIRALAX) 17 gram Oral Powder in Packet Take 2 Packets (34 g total) by mouth Twice daily 06/08/21   Murtis Sink, MD   potassium chloride (K-DUR) 20 mEq Oral Tab Sust.Rel. Particle/Crystal Take 1  Tablet (20 mEq total) by mouth Once a day    Provider, Historical   predniSONE (DELTASONE) 10 mg Oral Tablet Take 30mg  daily on 12/7 then 20mg  daily for 3 days then 10mg  daily for 3 days. End 12/13 06/08/21   , MD   sennosides-docusate sodium (SENOKOT-S) 8.6-50 mg Oral Tablet Take 2 Tablets by mouth Twice daily 06/08/21   Murtis Sink, MD          ROS:   General: No fever or chills. No weight changes, fatigue, weakness.   HEENT: No headaches, dizziness, changes in vision, changes in hearing, or difficulty swallowing.    Skin:  No rashes, erythema or bruises.   Cardiac: No chest pain, palpitations, or arrhythmia.    Respiratory:  Increasing shortness of breath with exertion and rest.  GI: No nausea or vomiting. No abdominal pain.   Urinary: No dysuria, hematuria, or change in frequency.    Vascular: No edema.     Musculoskeletal: No muscle weakness, pain, or decreased range of motion.   Neurologic: No loss of sensation, numbness or tingling.   Endocrine: No heat or cold intolerance or polydipsia.   Psychiatric: No insomnia, depression or anxiety.      Results for orders placed or performed during the hospital encounter of 06/30/22 (from the past 24 hour(s))   ECG 12 LEAD   Result Value Ref Range    Ventricular rate 67 BPM    Atrial Rate 67 BPM    PR Interval 114 ms    QRS Duration 82 ms    QT Interval 388 ms    QTC Calculation 409 ms    Calculated P Axis 2 degrees    Calculated R Axis 45 degrees    Calculated T Axis 105 degrees   LACTIC ACID LEVEL W/ REFLEX FOR LEVEL >2.0   Result Value Ref Range    LACTIC ACID 0.9 0.4 - 2.0 mmol/L   MAGNESIUM   Result Value Ref Range    MAGNESIUM 1.9 1.8 - 2.4 mg/dL   14/6/22, FLU A/B, RSV RAPID BY PCR   Result Value Ref Range    SARS-CoV-2 Not Detected Not Detected    INFLUENZA VIRUS TYPE A Not Detected Not Detected  INFLUENZA VIRUS TYPE B Not Detected Not Detected    RESPIRATORY SYNCTIAL VIRUS (RSV) Not Detected Not Detected   NT-PROBNP   Result Value Ref  Range    NT-PROBNP 799 (H) <=125 pg/mL   COMPREHENSIVE METABOLIC PANEL, NON-FASTING   Result Value Ref Range    SODIUM 140 136 - 145 mmol/L    POTASSIUM 4.2 3.5 - 5.1 mmol/L    CHLORIDE 103 98 - 107 mmol/L    CO2 TOTAL 32 21 - 32 mmol/L    ANION GAP 5 4 - 13 mmol/L    BUN 14 7 - 18 mg/dL    CREATININE 4.540.81 0.980.55 - 1.02 mg/dL    BUN/CREA RATIO 17     ESTIMATED GFR 77 >59 mL/min/1.6446m^2    ALBUMIN 3.1 (L) 3.4 - 5.0 g/dL    CALCIUM 9.3 8.5 - 11.910.1 mg/dL    GLUCOSE 147101 74 - 829106 mg/dL    ALKALINE PHOSPHATASE 82 46 - 116 U/L    ALT (SGPT) 12 <=78 U/L    AST (SGOT) 12 (L) 15 - 37 U/L    BILIRUBIN TOTAL 0.4 0.2 - 1.0 mg/dL    PROTEIN TOTAL 6.6 6.4 - 8.2 g/dL    ALBUMIN/GLOBULIN RATIO 0.9 0.8 - 1.4    OSMOLALITY, CALCULATED 280 270 - 290 mOsm/kg    CALCIUM, CORRECTED 10.0 mg/dL    GLOBULIN 3.5    CBC WITH DIFF   Result Value Ref Range    WBC 6.4 4.0 - 10.5 x10^3/uL    RBC 4.02 (L) 4.20 - 5.40 x10^6/uL    HGB 11.6 (L) 12.5 - 16.0 g/dL    HCT 56.237.2 13.037.0 - 86.547.0 %    MCV 92.6 78.0 - 99.0 fL    MCH 28.9 27.0 - 32.0 pg    MCHC 31.3 (L) 32.0 - 36.0 g/dL    RDW 78.416.0 (H) 69.611.6 - 14.8 %    PLATELETS 166 140 - 440 x10^3/uL    MPV 10.8 (H) 7.4 - 10.4 fL   MANUAL DIFFERENTIAL   Result Value Ref Range    WBC 6.4 x10^3/uL    NEUTROPHIL % 71 40 - 76 %    LYMPHOCYTE % 18 (L) 25 - 45 %    MONOCYTE % 4 0 - 12 %    EOSINOPHIL % 4 0 - 7 %    BASOPHIL % 1 0 - 3 %    METAMYELOCYTE %      MYELOCYTE %      PROMYELOCYTE %      BAND % 2 (L) 5 - 11 %    BLAST %      OTHER %      NEUTROPHIL ABSOLUTE 4.67 1.80 - 8.40 x10^3/uL    LYMPHOCYTE ABSOLUTE 1.15 1.10 - 5.00 x10^3/uL    MONOCYTE ABSOLUTE 0.26 0.00 - 1.30 x10^3/uL    EOSINOPHIL ABSOLUTE 0.26 0.00 - 0.80 x10^3/uL    BASOPHIL ABSOLUTE 0.06 0.00 - 0.30 x10^3/uL    METAMYELOCYTE ABSOLUTE      MYELOCYTE ABSOLUTE      PROMYELOCYTE ABSOLUTE      BLAST ABSOLUTE      OTHER CELL ABSOLUTE      ANISOCYTOSIS      POLYCHROMASIA      POIKILOCYTOSIS      BASOPHILIC STIPPLING      MICROCYTOSIS      MACROCYTOSIS       ROULEAUX      SCHISTOCYTES      SPHEROCYTES  TARGET CELLS      TEARDROP CELLS      OVALOCYTE (ELLIPTOCYTE)      CRENATED RED CELLS      STOMATOCYTES      ACANTHOCYTES (SPUR CELL)      ECHINOCYTE (BURR CELL)      BLISTER CELLS      RBC AGGLUTINATES      HOWELL JOLLY BODIES      ATYPICAL LYMPHOCYTES      TOXIC GRANULATION      DOHLE BODIES      TOXIC VACUOLIZATION      AUER RODS      BASKET CELLS      HYPERSEGMENTATION      LARGE PLATELETS      PLATELET CLUMPS      WBC MORPHOLOGY COMMENT      RBC MORPHOLOGY COMMENT Normal     PLATELET MORPHOLOGY COMMENT Adequate     BANDS NEUTROPHILS MANUAL 2     BAND ABSOLUTE      NEUTROPHILS MANUAL 71     LYMPHOCYTES MANUAL 18     MONOCYTES MANUAL 4     EOSINOPHILS MANUAL 4     BASOPHILS MANUAL 1     PROMYELOCYTES MANUAL      MYELOCYTES MANUAL      METAMYELOCYTES MANUAL      BLASTS MANUAL      TOTAL CELLS COUNTED [#] IN BLOOD 100     OTHER CELLS MANUAL      NUCLEATED RBC MANUAL      PLASMA CELL %      PLASMA CELL ABSOLUE      PLASMA CELLS MANUAL      HYPOCHROMASIA     PT/INR   Result Value Ref Range    PROTHROMBIN TIME 12.2 9.8 - 12.7 seconds    INR 1.05 0.88 - 1.10   TROPONIN-I   Result Value Ref Range    TROPONIN I 7 <15 ng/L          Physical:  Filed Vitals:    07/01/22 0315 07/01/22 0400 07/01/22 0435 07/01/22 0440   BP: (!) 117/55 127/67  (!) 145/42   Pulse: 78 82 83 76   Resp: (!) 21 17  (!) 22   Temp:    36.4 C (97.6 F)   SpO2: 91% 91%  90%      General: Patient is alert and oriented to person, place, and time. No acute distress. Communicates appropriately.   Heart: Regular rate and rhythm. S1 & S2 present. No S3 or S4. No rubs, gallops, or murmurs appreciated.   Lungs: Clear to auscultation bilaterally with no wheezes or rales. Equal chest excursion.  No conversational dyspnea. No respiratory distress noted.   Abdomen: Soft, nontender, nondistended belly. Bowel sounds are present in all four quadrants. No rigidity.  No guarding.  No ascites.   Extremities: No edema,  cyanosis, or clubbing. Grossly moves all extremities.    Skin: Warm and dry without lesions. No ecchymosis noted.    Neurologic: Cranial nerves II through XII are grossly intact.  Genitourinary:  No urinary incontinence or Foley catheter   Psychiatric: Judgment and insight are intact. Mood and affect are appropriate for the situation.       Diagnostic studies:  No results found.       EKG interpretation:     @PEVF @    Assessment:  Active Hospital Problems   (*Primary Problem)    Diagnosis    *COPD with acute exacerbation (CMS  HCC)       Plan:  Patient will be admitted for the above problems.    **COPD**  Admitted to telemetry  Maintain oxygen greater than 90%, patient is on supplemental oxygen 3 L--home dose  IV Lasix, IV steroids, hand-held nebulizer  Guaifenesin and incentive spirometry  Nicotine patch  Lovenox for DVT prophylaxis  The hospitalist has examined patient, and reviewed all material, and agrees with the above medical management at this time.      DVT prophylaxis:  Lovenox      Burman Riis, FNP-BC    [facility] MEDICINE HOSPITALIST

## 2022-07-01 NOTE — ED Nurses Note (Signed)
Pt resting in bed at this time. Resp even and non labored. Equal chest rise and fall. Call bell in reach. Cardiac monitor in use. Pt remains on oxygen at 3 liters via nasal cannula.

## 2022-07-01 NOTE — ED Nurses Note (Signed)
Resp even and non labored. Equal chest rise and fall. Call bell in reach. Cardiac monitor in use. Oxygen at 3 liters via nasal cannula. Pt alert and oriented.

## 2022-07-01 NOTE — Care Plan (Signed)
Pt on 3 L NC. Needs PT eval per c/o weakness. Fall precautions in use. Foley draining. VS, labs, O2 monitored. Education and discharge planning.    Problem: Adult Inpatient Plan of Care  Goal: Patient-Specific Goal (Individualized)  Outcome: Ongoing (see interventions/notes)  Flowsheets  Taken 07/01/2022 0514  Plan of Care Reviewed With: patient  Taken 07/01/2022 0455  Individualized Care Needs: respiratory support, ABX, PT eval  Anxieties, Fears or Concerns: SOB  Patient-Specific Goals (Include Timeframe): decrease SOB

## 2022-07-01 NOTE — Respiratory Therapy (Signed)
Once patient started hhn treatment she started coughing which caused her heart rate to increase and caused her spo2 to decrease.  Once treatment started her spo2 increased to 94% without difficulty and her cough stopped.

## 2022-07-01 NOTE — Respiratory Therapy (Signed)
Patient states she is not interested in smoking cessation

## 2022-07-01 NOTE — ED Nurses Note (Signed)
Pt resting in bed at this time. Resp even and non labored. Equal chest rise and fall. Call bell in reach. Cardiac monitor in use. Family at bedside.

## 2022-07-01 NOTE — ED Nurses Note (Signed)
Pt's foley emptied. Approx 700cc of yellow urine out.

## 2022-07-02 DIAGNOSIS — J9690 Respiratory failure, unspecified, unspecified whether with hypoxia or hypercapnia: Secondary | ICD-10-CM

## 2022-07-02 DIAGNOSIS — I509 Heart failure, unspecified: Secondary | ICD-10-CM

## 2022-07-02 DIAGNOSIS — R0600 Dyspnea, unspecified: Secondary | ICD-10-CM

## 2022-07-02 DIAGNOSIS — R9431 Abnormal electrocardiogram [ECG] [EKG]: Secondary | ICD-10-CM

## 2022-07-02 DIAGNOSIS — J9601 Acute respiratory failure with hypoxia: Secondary | ICD-10-CM

## 2022-07-02 LAB — ECG 12 LEAD
Atrial Rate: 67 {beats}/min
Calculated P Axis: 2 degrees
Calculated R Axis: 45 degrees
Calculated T Axis: 105 degrees
PR Interval: 114 ms
QRS Duration: 82 ms
QT Interval: 388 ms
QTC Calculation: 409 ms
Ventricular rate: 67 {beats}/min

## 2022-07-02 LAB — CBC WITH DIFF
BASOPHIL #: 0 10*3/uL (ref 0.00–0.10)
BASOPHIL %: 0 % (ref 0–1)
EOSINOPHIL #: 0 10*3/uL (ref 0.00–0.50)
EOSINOPHIL %: 0 % — ABNORMAL LOW
HCT: 32.4 % (ref 31.2–41.9)
HGB: 10.7 g/dL — ABNORMAL LOW (ref 10.9–14.3)
LYMPHOCYTE #: 0.7 10*3/uL — ABNORMAL LOW (ref 1.00–3.00)
LYMPHOCYTE %: 7 % — ABNORMAL LOW (ref 16–44)
MCH: 28.9 pg (ref 24.7–32.8)
MCHC: 33.1 g/dL (ref 32.3–35.6)
MCV: 87.4 fL (ref 75.5–95.3)
MONOCYTE #: 0.7 10*3/uL (ref 0.30–1.00)
MONOCYTE %: 8 % (ref 5–13)
MPV: 10.3 fL (ref 7.9–10.8)
NEUTROPHIL #: 8.3 10*3/uL — ABNORMAL HIGH (ref 1.85–7.80)
NEUTROPHIL %: 86 % — ABNORMAL HIGH (ref 43–77)
PLATELETS: 147 10*3/uL (ref 140–440)
RBC: 3.71 10*6/uL (ref 3.63–4.92)
RDW: 15.4 % (ref 12.3–17.7)
WBC: 9.7 10*3/uL (ref 3.8–11.8)

## 2022-07-02 LAB — URINALYSIS, MACROSCOPIC
BILIRUBIN: NEGATIVE mg/dL
BLOOD: NEGATIVE mg/dL
GLUCOSE: NEGATIVE mg/dL
KETONES: NEGATIVE mg/dL
LEUKOCYTES: NEGATIVE WBCs/uL
NITRITE: NEGATIVE
PH: 5.5 (ref 5.0–9.0)
PROTEIN: NEGATIVE mg/dL
SPECIFIC GRAVITY: 1.012 (ref 1.002–1.030)
UROBILINOGEN: NORMAL mg/dL

## 2022-07-02 LAB — MAGNESIUM: MAGNESIUM: 2.1 mg/dL (ref 1.9–2.7)

## 2022-07-02 LAB — BASIC METABOLIC PANEL
ANION GAP: 7 mmol/L (ref 4–13)
BUN/CREA RATIO: 28 — ABNORMAL HIGH (ref 6–22)
BUN: 18 mg/dL (ref 7–25)
CALCIUM: 8.9 mg/dL (ref 8.6–10.3)
CHLORIDE: 104 mmol/L (ref 98–107)
CO2 TOTAL: 28 mmol/L (ref 21–31)
CREATININE: 0.64 mg/dL (ref 0.60–1.30)
ESTIMATED GFR: 93 mL/min/{1.73_m2} (ref >59)
GLUCOSE: 124 mg/dL — ABNORMAL HIGH (ref 74–109)
OSMOLALITY, CALCULATED: 281 mOsm/kg (ref 270–290)
POTASSIUM: 3.8 mmol/L (ref 3.5–5.1)
SODIUM: 139 mmol/L (ref 136–145)

## 2022-07-02 LAB — URINALYSIS, MICROSCOPIC
HYALINE CASTS: 5 /lpf — ABNORMAL HIGH (ref <0)
RBCS: 2 /hpf (ref <4)
SQUAMOUS EPITHELIAL: <1 /hpf (ref <28)
WBCS: 1 /hpf (ref <6)

## 2022-07-02 MED ORDER — DOXYCYCLINE HYCLATE 100 MG CAPSULE
100.0000 mg | ORAL_CAPSULE | Freq: Two times a day (BID) | ORAL | 0 refills | Status: AC
Start: 2022-07-02 — End: 2022-07-08

## 2022-07-02 MED ORDER — PREDNISONE 20 MG TABLET
50.0000 mg | ORAL_TABLET | Freq: Every day | ORAL | 0 refills | Status: AC
Start: 2022-07-02 — End: 2022-07-06

## 2022-07-02 NOTE — Nurses Notes (Signed)
Patient discharged to home with daughter. Discharge information explained to patient. IV removed cath intact and pressure dressing applied. Tele removed. Personal belongings sent with patient. Patient out via wheelchair by staff.

## 2022-07-02 NOTE — Progress Notes (Signed)
[facility]   IP PROGRESS NOTE      Pedro, [place]  Date of Admission:  06/30/2022  Date of Birth:  01-24-1951  Date of Service:  07/02/2022    Hospital Day:  LOS: 1 day     HPI:  Misty Johnson is a 71 y.o. female who is being seen in f/u for COPDAE, CHF. Patient continues to spoke 1/2 ppd. She does not want to quit.  Has been ordered a patch. Echo is pending. States she feels about the same.  Has conversational dyspnea.  Swelling better.     07/02/2022  Patient seen and examined. States she has been incontinent since being admitted.  Increased frequency but no burning. Will get U/A.  Breathing is better. Feels she is about back to baseline. Remains on 3L .     Review of Systems:  Please refer to HPI    Vital Signs:  Temp (24hrs) Max:37.6 C (99.7 F)      Temperature: 37 C (98.6 F)  BP (Non-Invasive): (!) 133/48  MAP (Non-Invasive): 68 mmHG  Heart Rate: 80  Respiratory Rate: 20  SpO2: (!) 87 %    Physical Examination  General: Patient is alert and oriented to person, place, and time. No acute distress. Communicates appropriately.   Head: Normocephalic and atraumatic.    Eyes: Pupils equally round and react to light and accommodate. Extraocular movements intact.  Conjunctiva normal. Sclerae are normal.      Neck: Supple.  Trachea midline   Heart: Regular rate and rhythm.    Lungs: Wheezing throughout . Equal chest excursion.  + conversational dyspnea. No respiratory distress noted.   Abdomen: Soft, nontender, nondistended belly. Bowel sounds are present .   Extremities: trace edema, no cyanosis    Skin: Warm    Neurologic: nonfocal, speech intact   Psychiatric: Judgment and insight are intact. Mood and affect are appropriate for the situation.    Current Medications:  acetaminophen (TYLENOL) tablet, 650 mg, Oral, Q4H PRN  albuterol (PROVENTIL) 2.5 mg / 3 mL (0.083%) neb solution, 2.5 mg, Nebulization, Q4H PRN  alcohol 62 % (NOZIN NASAL SANITIZER) nasal solution, 1 Each, Each Nostril,  2x/day  aluminum-magnesium hydroxide-simethicone (MAG-AL PLUS) 200-200-20 mg per 5 mL oral liquid, 15 mL, Oral, 4x/day PRN  aspirin chewable tablet 81 mg, 81 mg, Oral, Daily  atorvastatin (LIPITOR) tablet, 40 mg, Oral, QPM  budesonide (PULMICORT RESPULES) 0.5 mg/2 mL nebulizer suspension, 0.5 mg, Nebulization, 2x/day  citalopram (CeleXA) tablet, 20 mg, Oral, Daily  diphenhydrAMINE (BENADRYL) 50 mg/mL injection, 25 mg, Intravenous, Q6H PRN  doxycycline hyclate 100 mg in NS 100 mL IVPB minibag, 100 mg, Intravenous, Q12H  enoxaparin PF (LOVENOX) 40 mg/0.4 mL SubQ injection, 40 mg, Subcutaneous, Q24H  furosemide (LASIX) 10 mg/mL injection, 20 mg, Intravenous, 2x/day  guaiFENesin (MUCINEX) extended release tablet - for cough (expectorant), 600 mg, Oral, 2x/day  guaiFENesin  per 5mL oral liquid - for cough (expectorant), 200 mg, Oral, Q4H PRN  hydrALAZINE (APRESOLINE) injection 10 mg, 10 mg, Intravenous, Q4H PRN  ipratropium-albuterol 0.5 mg-3 mg(2.5 mg base)/3 mL Solution for Nebulization, 3 mL, Nebulization, Q4H  LORazepam (ATIVAN) 2 mg/mL injection, 0.5 mg, Intravenous, Q4H PRN  methylPREDNISolone sod succ (SOLU-medrol) 40 mg/mL injection, 40 mg, Intravenous, Q8H  morphine 2 mg/mL injection, 2 mg, Intravenous, Q4H PRN  nicotine (NICODERM CQ) transdermal patch (mg/24 hr), 14 mg, Transdermal, Daily  nitroGLYCERIN (NITROSTAT) sublingual tablet, 0.4 mg, Sublingual, Q5 Min PRN  ondansetron (ZOFRAN) 2 mg/mL injection, 4 mg, Intravenous,  Q8H PRN  oxyCODONE (ROXICODONE) immediate release tablet, 5 mg, Oral, Q4H PRN  pantoprazole (PROTONIX) delayed release tablet, 40 mg, Oral, Daily  perflutren lipid microspheres (DEFINITY) 1.3 mL in NS 10 mL (tot vol) injection, 2 mL, Intravenous, Cardiology Once PRN  polyethylene glycol (MIRALAX) oral packet, 17 g, Oral, Daily PRN  zolpidem (AMBIEN) tablet, 5 mg, Oral, HS PRN        Current Orders:  Active Orders   Microbiology    RESPIRATORY CULTURE AND GRAM STAIN, AEROBIC - SPUTUM      Frequency: ONE TIME     Number of Occurrences: 1 Occurrences    SPUTUM SCREEN     Frequency: Once     Number of Occurrences: 1 Occurrences   Lab    BASIC METABOLIC PANEL - AM ONCE     Frequency: 0530 - AM DRAW     Number of Occurrences: 1 Occurrences    CBC     Frequency: 0530 - AM DRAW     Number of Occurrences: 1 Occurrences    URINALYSIS, MACROSCOPIC     Frequency: Once     Number of Occurrences: 1 Occurrences    URINALYSIS, MACROSCOPIC AND MICROSCOPIC W/CULTURE REFLEX [PRN ONLY]     Frequency: ONE TIME     Number of Occurrences: 1 Occurrences    URINALYSIS, MICROSCOPIC     Frequency: Once     Number of Occurrences: 1 Occurrences   Diet    DIET DIABETIC Calorie amount: CC 2000; Do you want to initiate MNT Protocol? Yes     Frequency: All Meals     Number of Occurrences: 1 Occurrences   Nursing    ACTIVITY Activity: AS TOLERATED; Instructions: WITH ASSIST     Frequency: UNTIL DISCONTINUED     Number of Occurrences: Until Specified    FOLEY CATHETER INSERT & MAINTAIN     Frequency: UNTIL DISCONTINUED     Number of Occurrences: Until Specified    INTAKE AND OUTPUT QSHIFT     Frequency: QSHIFT     Number of Occurrences: Until Specified    Notify MD Vital Signs     Frequency: PRN     Number of Occurrences: Until Specified    PT IS INTERMEDIATE RISK FOR VENOUS THROMBOEMBOLISM     Frequency: CONTINUOUS     Number of Occurrences: Until Specified    PULSE OXIMETRY Q4H     Frequency: Q4H     Number of Occurrences: Until Specified    TELEMETRY MONITORING X 48H     Frequency: CONTINUOUS X 48 HRS     Number of Occurrences: 48 Hours    VITAL SIGNS  Q4H     Frequency: Q4H     Number of Occurrences: Until Specified    WEIGH PATIENT     Frequency: DAILY (0600)     Number of Occurrences: Until Specified   Code Status    FULL CODE     Frequency: CONTINUOUS     Number of Occurrences: Until Specified   PT    PT EVALUATE AND TREAT     Frequency: Per Therapist Discretion     Number of Occurrences: 1 Occurrences     Order Comments: If  patient's O2 level drops, PT may increase O2 until sats are > 93%.       Scheduling Instructions:               ECHO    TRANSTHORACIC ECHOCARDIOGRAM - ADULT     Frequency: ONE  TIME     Number of Occurrences: 1 Occurrences     Scheduling Instructions:               Medications    acetaminophen (TYLENOL) tablet     Frequency: Q4H PRN     Dose: 650 mg     Route: Oral    albuterol (PROVENTIL) 2.5 mg / 3 mL (0.083%) neb solution     Frequency: Q4H PRN     Dose: 2.5 mg     Route: Nebulization    alcohol 62 % (NOZIN NASAL SANITIZER) nasal solution     Frequency: 2x/day     Dose: 1 Each     Route: Each Nostril    aluminum-magnesium hydroxide-simethicone (MAG-AL PLUS) 200-200-20 mg per 5 mL oral liquid     Frequency: 4x/day PRN     Dose: 15 mL     Route: Oral    aspirin chewable tablet 81 mg     Frequency: Daily     Dose: 81 mg     Route: Oral    atorvastatin (LIPITOR) tablet     Frequency: QPM     Dose: 40 mg     Route: Oral    budesonide (PULMICORT RESPULES) 0.5 mg/2 mL nebulizer suspension     Frequency: 2x/day     Dose: 0.5 mg     Route: Nebulization    citalopram (CeleXA) tablet     Frequency: Daily     Dose: 20 mg     Route: Oral    diphenhydrAMINE (BENADRYL) 50 mg/mL injection     Frequency: Q6H PRN     Dose: 25 mg     Route: Intravenous    doxycycline hyclate 100 mg in NS 100 mL IVPB minibag     Frequency: Q12H     Dose: 100 mg     Route: Intravenous    enoxaparin PF (LOVENOX) 40 mg/0.4 mL SubQ injection     Frequency: Q24H     Dose: 40 mg     Route: Subcutaneous    furosemide (LASIX) 10 mg/mL injection     Frequency: 2x/day     Dose: 20 mg     Route: Intravenous    guaiFENesin (MUCINEX) extended release tablet - for cough (expectorant)     Frequency: 2x/day     Dose: 600 mg     Route: Oral    guaiFENesin 100mg  per 87mL oral liquid - for cough (expectorant)     Frequency: Q4H PRN     Dose: 200 mg     Route: Oral    hydrALAZINE (APRESOLINE) injection 10 mg     Frequency: Q4H PRN     Dose: 10 mg     Route: Intravenous     ipratropium-albuterol 0.5 mg-3 mg(2.5 mg base)/3 mL Solution for Nebulization     Frequency: Q4H     Dose: 3 mL     Route: Nebulization    LORazepam (ATIVAN) 2 mg/mL injection     Frequency: Q4H PRN     Dose: 0.5 mg     Route: Intravenous    methylPREDNISolone sod succ (SOLU-medrol) 40 mg/mL injection     Frequency: Q8H     Dose: 40 mg     Route: Intravenous    morphine 2 mg/mL injection     Frequency: Q4H PRN     Dose: 2 mg     Route: Intravenous    nicotine (NICODERM CQ) transdermal patch (mg/24 hr)  Frequency: Daily     Dose: 14 mg     Route: Transdermal    nitroGLYCERIN (NITROSTAT) sublingual tablet     Frequency: Q5 Min PRN     Dose: 0.4 mg     Route: Sublingual    ondansetron (ZOFRAN) 2 mg/mL injection     Frequency: Q8H PRN     Dose: 4 mg     Route: Intravenous    oxyCODONE (ROXICODONE) immediate release tablet     Frequency: Q4H PRN     Dose: 5 mg     Route: Oral    pantoprazole (PROTONIX) delayed release tablet     Frequency: Daily     Dose: 40 mg     Route: Oral    perflutren lipid microspheres (DEFINITY) 1.3 mL in NS 10 mL (tot vol) injection     Frequency: Cardiology Once PRN     Dose: 2 mL     Route: Intravenous    polyethylene glycol (MIRALAX) oral packet     Frequency: Daily PRN     Dose: 17 g     Route: Oral    zolpidem (AMBIEN) tablet     Frequency: HS PRN     Dose: 5 mg     Route: Oral        Consults: none    I/O:  I/O last 24 hours:    Intake/Output Summary (Last 24 hours) at 07/02/2022 1008  Last data filed at 07/02/2022 0904  Gross per 24 hour   Intake 1240 ml   Output --   Net 1240 ml     I/O current shift:  12/30 0700 - 12/30 1859  In: 240 [P.O.:240]  Out: -       Labs (Please indicate ordered or reviewed)  Reviewed: Lab Results Today:   Results for orders placed or performed during the hospital encounter of 06/30/22 (from the past 24 hour(s))   BASIC METABOLIC PANEL, NON-FASTING   Result Value Ref Range    SODIUM 139 136 - 145 mmol/L    POTASSIUM 3.8 3.5 - 5.1 mmol/L    CHLORIDE 104 98  - 107 mmol/L    CO2 TOTAL 28 21 - 31 mmol/L    ANION GAP 7 4 - 13 mmol/L    CALCIUM 8.9 8.6 - 10.3 mg/dL    GLUCOSE 191 (H) 74 - 109 mg/dL    BUN 18 7 - 25 mg/dL    CREATININE 4.78 2.95 - 1.30 mg/dL    BUN/CREA RATIO 28 (H) 6 - 22    ESTIMATED GFR 93 >59 mL/min/1.16m^2    OSMOLALITY, CALCULATED 281 270 - 290 mOsm/kg   MAGNESIUM   Result Value Ref Range    MAGNESIUM 2.1 1.9 - 2.7 mg/dL   CBC WITH DIFF   Result Value Ref Range    WBC 9.7 3.8 - 11.8 x10^3/uL    RBC 3.71 3.63 - 4.92 x10^6/uL    HGB 10.7 (L) 10.9 - 14.3 g/dL    HCT 62.1 30.8 - 65.7 %    MCV 87.4 75.5 - 95.3 fL    MCH 28.9 24.7 - 32.8 pg    MCHC 33.1 32.3 - 35.6 g/dL    RDW 84.6 96.2 - 95.2 %    PLATELETS 147 140 - 440 x10^3/uL    MPV 10.3 7.9 - 10.8 fL    NEUTROPHIL % 86 (H) 43 - 77 %    LYMPHOCYTE % 7 (L) 16 - 44 %    MONOCYTE %  8 5 - 13 %    EOSINOPHIL % 0 (L) %    BASOPHIL % 0 0 - 1 %    NEUTROPHIL # 8.30 (H) 1.85 - 7.80 x10^3/uL    LYMPHOCYTE # 0.70 (L) 1.00 - 3.00 x10^3/uL    MONOCYTE # 0.70 0.30 - 1.00 x10^3/uL    EOSINOPHIL # 0.00 0.00 - 0.50 x10^3/uL    BASOPHIL # 0.00 0.00 - 0.10 x10^3/uL     Ordered:  am labs     Radiology Tests (Please indicate ordered or reviewed)  Reviewed: All imaging studies reviewed   Ordered:  echo pending     Problem List:  Active Hospital Problems   (*Primary Problem)    Diagnosis    *COPD with acute exacerbation (CMS HCC)    Congestive heart failure, unspecified HF chronicity, unspecified heart failure type (CMS HCC)    Dyspnea, unspecified type    Acute respiratory failure with hypoxia (CMS HCC)         Assessment/ Plan:     COPDAE/Dyspnea/Acute Respiratory Failure with hypoxia   -Neb treatments q 4  -IV solumedrol  -nicotine patch  -O2 via NC 3L   -mucinex   -Doxycycline     CHF  -Lasix   -Echo pending  -monitor electrolytes daily   -monitor daily I and Os     New onset urinary incontinence  -u/s for C and S        The Hospitalist personally evaluated and examined the patient in conjunction with the MLP and agree  with the assessments, treatment plan and disposition of the patient as recorded by the [facility].      DVT/PE Prophylaxis: Lovenox     Francenia Chimenti D Choice Kleinsasser, PA-C

## 2022-07-02 NOTE — Discharge Summary (Addendum)
[place]     DISCHARGE SUMMARY      PATIENT NAME:  Misty Johnson   MRN:  [id number]  DOB:  [birth year]    INPATIENT ADMISSION DATE: 06/30/2022   DATE OF DISCHARGE:  07/02/22     ATTENDING PHYSICIAN: Phoebe Perch, MD     PRIMARY CARE PHYSICIAN: Romilda Joy, MD     HOSPITAL PRESENTATION:    Please see full admission H&P for details.      As per HPI:    Misty Johnson is a 71 year old female is admitted through the service of the ED. Patient presented to the  emergency department with complaints of increasing shortness of breath over the last several days. Patient does have history of COPD. Patient has long history of at least 1 pack-a-day cigarette smoking. Patient is now smoking about half a pack a day. Patient does follow with pulmonology in [place]. Patient is normally on 3 L oxygen at home. Patient remains on 3 L at 90-91% at time examination. She was found to be in an acute exacerbation of her COPD and admitted for further evaluation and workup. She was started on IV doxycycline, solumedrol and neb treatments. She has not required more than her home 3L of O2.  Today, patient states she is feeling better, feels she is back at her baseline and would like to go home. She has had new urinary incontinence and will get a u/a prior to d/c. She can f/u with her PCP and Dr Nelva Bush next week. The Hospitalist personally evaluated and examined the patient in conjunction with the MLP and agree with the assessments, treatment plan and disposition of the patient as recorded by the [facility].                    FURTHER HOSPITAL COURSE WITH DISCHARGE DIAGNOSES:          Problem List:  Active Hospital Problems   (*Primary Problem)    Diagnosis    *COPD with acute exacerbation (CMS HCC)    Congestive heart failure, unspecified HF chronicity, unspecified heart failure type (CMS HCC)    Dyspnea, unspecified type    Acute respiratory failure with hypoxia (CMS HCC)               PHYSICAL EXAM   DAY  OF DISCHARGE:    BP (!) 124/40   Pulse 79   Temp 37 C (98.6 F)   Resp 20   Ht 1.626 m (_0 )   Wt 77.1 kg (170 lb)   SpO2 92%   BMI 29.18 kg/m          General:  Patient is resting in bed, no acute distress, alert and oriented x3   Eyes:  PERRL, no scleral icterus   HENT:  Normocephalic, atraumatic, oral mucosa is moist and pink, no nasal discharge   Heart:  RRR  Lungs:  Unlabored respirations.  Wheezing throughout   Abdomen:  Soft, active bowel sounds, non-tender to palpation, non-distended  Extremities:    No edema in lower extremities bilaterally   Skin:  Warm and dry.  Not diaphoretic  Neuro:  A&O x 3.  No focal deficits.  Speech intact.  Not tremulous  Psych:  Cooperative, not agitated    LABS:  CBC with Diff (Last 48 Hours):    Recent Results in last 48 hours     06/30/22  2154 07/01/22  0529 07/02/22  0525   WBC 6.4  6.4 5.7  9.7   HGB 11.6* 11.5 10.7*   HCT 37.2 34.5 32.4   MCV 92.6 87.9 87.4   PLTCNT 166 135* 147   BANDS 2*  --   --    PMNS 71 92* 86*   LYMPHOCYTES 18* 7* 7*   MONOCYTES 4 1* 8   EOSINOPHIL 4 0* 0*   BASOPHILS 1 0  0.00 0  0.00          Last BMP  (Last result in the past 48 hours)        Na   K   Cl   CO2   BUN   Cr   Calcium   Glucose   Glucose-Fasting        07/02/22 0525 139   3.8   104   28   18   0.64   8.9   124               Last Hepatic Panel  (Last result in the past 48 hours)        Albumin   Total PTN   Total Bili   Direct Bili   Ast/SGOT   Alt/SGPT   Alk Phos        06/30/22 2154 3.1   6.6   0._0 82                BMP (Last 48 Hours):    Recent Results in last 48 hours     06/30/22  2154 07/01/22  0529 07/02/22  0525   SODIUM 140 139 139   POTASSIUM 4.2 4.1 3.8   CHLORIDE 103 103 104   CO2 32 29 28   BUN _1 CREATININE 0.81 0.73 0.64   CALCIUM 9.3 9.1 8.9   GLUCOSENF 101 179* 124*          DISCHARGE MEDICATIONS:     Current Discharge Medication List        START taking these medications.        Details   doxycycline hyclate 100 mg  Capsule  Commonly known as: VIBRAMYCIN   100 mg, Oral, 2 TIMES DAILY  Qty: 12 Capsule  Refills: 0            CONTINUE these medications which have CHANGED during your visit.        Details   predniSONE 20 mg Tablet  Commonly known as: DELTASONE  What changed:   medication strength  how much to take  how to take this  when to take this  additional instructions   50 mg, Oral, DAILY  Qty: 10 Tablet  Refills: 0            CONTINUE these medications - NO CHANGES were made during your visit.        Details   albuterol sulfate 2.5 mg /3 mL (0.083 %) nebulizer solution  Commonly known as: PROVENTIL   2.5 mg, Nebulization, DAILY PRN  Refills: 0     aspirin 81 mg Tablet, Chewable   81 mg, Oral, DAILY  Refills: 0     atorvastatin 40 mg Tablet  Commonly known as: LIPITOR   40 mg, Oral, EVERY EVENING  Refills: 0     BREZTRI AEROSPHERE INHL   Inhalation, DAILY  Refills: 0     ergocalciferol (vitamin D2) 1,250 mcg (50,000 unit) Capsule  Commonly known as: DRISDOL   50,000 Units, Oral, EVERY  7 DAYS  Refills: 0     escitalopram oxalate 20 mg Tablet  Commonly known as: LEXAPRO   20 mg, Oral, DAILY  Refills: 0     furosemide 40 mg Tablet  Commonly known as: LASIX   40 mg, Oral, DAILY  Refills: 0     ipratropium-albuteroL 0.5 mg-3 mg(2.5 mg base)/3 mL nebulizer solution  Commonly known as: DUONEB   3 mL, Nebulization, 4 TIMES DAILY  Refills: 0     Levocetirizine 5 mg Tablet  Commonly known as: XYZAL   5 mg, Oral, EVERY EVENING  Refills: 0     omeprazole 20 mg Capsule, Delayed Release(E.C.)  Commonly known as: PRILOSEC   20 mg, Oral, DAILY  Refills: 0     ondansetron 4 mg Tablet, Rapid Dissolve  Commonly known as: ZOFRAN ODT   4 mg, Oral, EVERY 8 HOURS PRN  Qty: 12 Tablet  Refills: 0     OXYGEN-AIR DELIVERY SYSTEMS DEACT   Does not apply, 3L NC at home  Refills: 0     polyethylene glycol 17 gram Powder in Packet  Commonly known as: MIRALAX   34 g, Oral, 2 TIMES DAILY  Refills: 0     potassium chloride 20 mEq Tab Sust.Rel.  Particle/Crystal  Commonly known as: K-DUR   20 mEq, Oral, DAILY  Refills: 0     sennosides-docusate sodium 8.6-50 mg Tablet  Commonly known as: SENOKOT-S   2 Tablets, Oral, 2 TIMES DAILY  Refills: 0            STOP taking these medications.      azithromycin 500 mg Tablet  Commonly known as: ZITHROMAX                 DISCHARGE DISPOSITION:  home     DISCHARGE INSTRUCTIONS:     No discharge procedures on file.             Copies sent to Care Team         Relationship Specialty Notifications Start End    Romilda Joy, MD PCP - General FAMILY MEDICINE  04/13/22     Phone: [phone] Fax: [phone]         [address] [id number]            >30 minutes total were spent coordinating discharge day today      Verdie Drown, [place] HOSPITALIST        I personally saw and evaluated the patient as part of a shared service with an APP.      I agree with the findings and assessment as documented above. In addition to the following:    -Patient had symptoms of CHF which included shortness of breath, orthopnea and bilateral leg swelling. BNP was also elevated, 297.  -However, echo showed no diastolic dysfunction. EF was normal.  -CHF is not confirmed but possible/likely. She was discharged on PO lasix. PCP to re-assess.    I independently of the APP spent a total of (30) minutes in direct/indirect care of this patient including initial evaluation, review of laboratory, radiology, diagnostic studies, review of medical record, order entry and coordination of care.      Phoebe Perch, MD'

## 2022-07-05 LAB — ADULT ROUTINE BLOOD CULTURE, SET OF 2 BOTTLES (BACTERIA AND YEAST)
BLOOD CULTURE, ROUTINE: NO GROWTH
BLOOD CULTURE, ROUTINE: NO GROWTH

## 2022-07-09 ENCOUNTER — Telehealth (HOSPITAL_COMMUNITY): Payer: Self-pay

## 2022-07-09 NOTE — Telephone Encounter (Signed)
TCM visit completed 1/4 with [facility].

## 2022-08-05 ENCOUNTER — Emergency Department
Admission: EM | Admit: 2022-08-05 | Discharge: 2022-08-05 | Disposition: A | Discharge status: Home / Self Care | Payer: 59 | Attending: FAMILY PRACTICE | Admitting: FAMILY PRACTICE

## 2022-08-05 ENCOUNTER — Encounter (HOSPITAL_BASED_OUTPATIENT_CLINIC_OR_DEPARTMENT_OTHER): Payer: Self-pay

## 2022-08-05 ENCOUNTER — Emergency Department (HOSPITAL_BASED_OUTPATIENT_CLINIC_OR_DEPARTMENT_OTHER): Payer: 59

## 2022-08-05 ENCOUNTER — Other Ambulatory Visit: Payer: Self-pay

## 2022-08-05 DIAGNOSIS — F17210 Nicotine dependence, cigarettes, uncomplicated: Secondary | ICD-10-CM

## 2022-08-05 DIAGNOSIS — R9431 Abnormal electrocardiogram [ECG] [EKG]: Secondary | ICD-10-CM | POA: Insufficient documentation

## 2022-08-05 DIAGNOSIS — Z720 Tobacco use: Secondary | ICD-10-CM | POA: Insufficient documentation

## 2022-08-05 DIAGNOSIS — I509 Heart failure, unspecified: Secondary | ICD-10-CM | POA: Insufficient documentation

## 2022-08-05 DIAGNOSIS — J449 Chronic obstructive pulmonary disease, unspecified: Secondary | ICD-10-CM | POA: Insufficient documentation

## 2022-08-05 DIAGNOSIS — J9611 Chronic respiratory failure with hypoxia: Secondary | ICD-10-CM | POA: Insufficient documentation

## 2022-08-05 DIAGNOSIS — I2510 Atherosclerotic heart disease of native coronary artery without angina pectoris: Secondary | ICD-10-CM | POA: Insufficient documentation

## 2022-08-05 DIAGNOSIS — J90 Pleural effusion, not elsewhere classified: Secondary | ICD-10-CM | POA: Insufficient documentation

## 2022-08-05 LAB — COMPREHENSIVE METABOLIC PANEL, NON-FASTING
ALBUMIN/GLOBULIN RATIO: 1 (ref 0.8–1.4)
ALBUMIN: 3.4 g/dL (ref 3.4–5.0)
ALKALINE PHOSPHATASE: 89 U/L (ref 46–116)
ALT (SGPT): 10 U/L (ref <=78)
ANION GAP: 9 mmol/L (ref 4–13)
AST (SGOT): 17 U/L (ref 15–37)
BILIRUBIN TOTAL: 0.8 mg/dL (ref 0.2–1.0)
BUN/CREA RATIO: 17
BUN: 15 mg/dL (ref 7–18)
CALCIUM, CORRECTED: 9.5 mg/dL
CALCIUM: 9 mg/dL (ref 8.5–10.1)
CHLORIDE: 105 mmol/L (ref 98–107)
CO2 TOTAL: 29 mmol/L (ref 21–32)
CREATININE: 0.9 mg/dL (ref 0.55–1.02)
ESTIMATED GFR: 68 mL/min/{1.73_m2} (ref >59)
GLOBULIN: 3.4
GLUCOSE: 110 mg/dL — ABNORMAL HIGH (ref 74–106)
OSMOLALITY, CALCULATED: 287 mOsm/kg (ref 270–290)
POTASSIUM: 3.9 mmol/L (ref 3.5–5.1)
PROTEIN TOTAL: 6.8 g/dL (ref 6.4–8.2)
SODIUM: 143 mmol/L (ref 136–145)

## 2022-08-05 LAB — CBC WITH DIFF
BASOPHIL #: 0.01 10*3/uL (ref 0.00–0.30)
BASOPHIL %: 0 % (ref 0–3)
EOSINOPHIL #: 0.43 10*3/uL (ref 0.00–0.80)
EOSINOPHIL %: 7 % (ref 0–7)
HCT: 41 % (ref 37.0–47.0)
HGB: 13 g/dL (ref 12.5–16.0)
LYMPHOCYTE #: 1.33 10*3/uL (ref 1.10–5.00)
LYMPHOCYTE %: 21 % — ABNORMAL LOW (ref 25–45)
MCH: 29.7 pg (ref 27.0–32.0)
MCHC: 31.8 g/dL — ABNORMAL LOW (ref 32.0–36.0)
MCV: 93.4 fL (ref 78.0–99.0)
MONOCYTE #: 0.6 10*3/uL (ref 0.00–1.30)
MONOCYTE %: 9 % (ref 0–12)
MPV: 10.2 fL (ref 7.4–10.4)
NEUTROPHIL #: 4.05 10*3/uL (ref 1.80–8.40)
NEUTROPHIL %: 63 % (ref 40–76)
PLATELETS: 212 10*3/uL (ref 140–440)
RBC: 4.39 10*6/uL (ref 4.20–5.40)
RDW: 17 % — ABNORMAL HIGH (ref 11.6–14.8)
WBC: 6.4 10*3/uL (ref 4.0–10.5)

## 2022-08-05 LAB — PT/INR
INR: 1.06 (ref 0.88–1.10)
PROTHROMBIN TIME: 12.3 seconds (ref 9.8–12.7)

## 2022-08-05 LAB — MAGNESIUM: MAGNESIUM: 1.9 mg/dL (ref 1.8–2.4)

## 2022-08-05 LAB — TROPONIN-I
TROPONIN I: 9 ng/L (ref <15)
TROPONIN I: 8 ng/L (ref <15)
TROPONIN I: 9 ng/L (ref <15)

## 2022-08-05 LAB — PTT (PARTIAL THROMBOPLASTIN TIME): APTT: 34.6 seconds — ABNORMAL HIGH (ref 22.0–31.7)

## 2022-08-05 MED ORDER — FUROSEMIDE 10 MG/ML INJECTION SOLUTION
INTRAMUSCULAR | Status: AC
Start: 2022-08-05 — End: 2022-08-05
  Filled 2022-08-05: qty 4

## 2022-08-05 MED ORDER — MAGNESIUM SULFATE 1 GRAM/100 ML IN DEXTROSE 5 % INTRAVENOUS PIGGYBACK
1.0000 g | INJECTION | INTRAVENOUS | Status: DC
Start: 2022-08-05 — End: 2022-08-05

## 2022-08-05 MED ORDER — SODIUM CHLORIDE 0.9 % (FLUSH) INJECTION SYRINGE
3.0000 mL | INJECTION | Freq: Three times a day (TID) | INTRAMUSCULAR | Status: DC
Start: 2022-08-05 — End: 2022-08-05

## 2022-08-05 MED ORDER — ASPIRIN 81 MG CHEWABLE TABLET
CHEWABLE_TABLET | ORAL | Status: AC
Start: 2022-08-05 — End: 2022-08-05
  Filled 2022-08-05: qty 4

## 2022-08-05 MED ORDER — ASPIRIN 81 MG CHEWABLE TABLET
324.0000 mg | CHEWABLE_TABLET | ORAL | Status: AC
Start: 2022-08-05 — End: 2022-08-05
  Administered 2022-08-05: 324 mg via ORAL

## 2022-08-05 MED ORDER — FUROSEMIDE 10 MG/ML INJECTION SOLUTION
40.0000 mg | Freq: Once | INTRAMUSCULAR | Status: AC
Start: 2022-08-05 — End: 2022-08-05
  Administered 2022-08-05: 40 mg via INTRAVENOUS

## 2022-08-05 MED ORDER — SODIUM CHLORIDE 0.9 % (FLUSH) INJECTION SYRINGE
3.0000 mL | INJECTION | INTRAMUSCULAR | Status: DC | PRN
Start: 2022-08-05 — End: 2022-08-05

## 2022-08-05 NOTE — ED Triage Notes (Signed)
Pt complains of left sided chest pain since last night. States she is short of breath but no worse than usual.

## 2022-08-05 NOTE — ED Nurses Note (Signed)
Patient reports left sided lower chest pain, reports shortness of breath. Chronic 02 use at 3.5 lpm NC. Reports chest pain started yesterday evening but has gotten worse. Reports pain in left breast, achy. Denies any increased cough. Lung sounds are diminished bilaterally.

## 2022-08-05 NOTE — ED Nurses Note (Signed)
Spoke with daughter [name] at this time, informed of patient status.

## 2022-08-05 NOTE — ED Nurses Note (Signed)
Patient denies any current needs, spoke with daughter per patient permission and updated of plan of care. [name] [phone]

## 2022-08-05 NOTE — Discharge Instructions (Addendum)
You were given 40 mg of Lasix here before he left Hospital, labs reviewed and all your electrolytes are normal, renal function is normal and your heart enzymes are also normal.  Chest pain appears to be atypical.    Continue home medications as prescribed, low-salt diet, call Dr. Carmelia Roller office on Monday for follow-up.

## 2022-08-05 NOTE — ED Nurses Note (Signed)
Patient given written and verbal d/c instructions. All questions/concerns addressed. Informed to return with any concerns. Informed to follow up with cardiologist in 2-3 days. Verbalizes understanding of d/c instructions. Patient leaving ambulatory at this time.

## 2022-08-05 NOTE — ED Provider Notes (Signed)
[facility], [facility] Emergency Department  ED Primary Provider Note  History of Present Illness   Chief Complaint   Patient presents with    Chest Pain      Misty Johnson is a 72 y.o. female who had concerns including Chest Pain .  Arrival: The patient arrived by Car    This 72 year old female patient presents to the emergency department with onset of chest pain yesterday evening into the night and today, lower left anterior chest and behind the breast described as a achy pain, denies stabbing, pressure or squeezing sensation.  She also has felt some episodic rapid heartbeats and forceful help heartbeats over the last 24 hours as well, denies any dyspnea beyond the baseline, or increased coughing.  She does have nasal congestion with postnasal drip and mild pharyngitis, no headaches or body aches beyond the norm with the exception of the chest pain.  She has a past medical history significant for patient's past medical history is significant for COPD, coronary artery disease, cardiomegaly, heart failure, coronary artery fistula into the ventricle, compression fractures and LVH.  No past surgeries.  She continues to smoke, no vaping, alcohol, marijuana or illicit drug use.    Patient is Moderna COVID vaccinated, has never had COVID.  She has had this season's influenza vaccine.      History Reviewed This Encounter:  Patient's past medical, surgical, social history reviewed noted.    Physical Exam   ED Triage Vitals [08/05/22 1435]   BP (Non-Invasive) 128/77   Heart Rate 64   Respiratory Rate 18   Temperature 36.3 C (97.3 F)   SpO2 96 %   Weight 73.5 kg (162 lb)   Height 1.626 m (5\' 4" )     Physical Exam  General:  No acute distress at rest, nontoxic but slightly anxious.    Ears:  TMs clear without retraction   Nasal:  Boggy mucosa, patent bilaterally.    Oral: Pink and moist  Pharynx:  Pink and moist no PND or exudate.    Neck: Supple without adenopathy, no JVD.  Trachea is in  midline.    Lungs:  Distant bilaterally, dull in the left base, dull in the right from the mid chest down.  Long expiratory phase of respiration with expiratory wheezes noted in the bases bilaterally.  She was very distant upper lungs, not dull, does have faint expiratory rhonchi and wheezes in the upper lungs.    Heart:  Regular rate rhythm S1-S2 with a soft sternal border mid systolic murmur.  Abdomen:  Soft, normal bowel sounds nontender   Extremities:  Moving symmetrically  Skin:  No suspicious rashes lesions, trace dependent edema.  Neurological: No gross focal deficits.        Patient Data     Labs Ordered/Reviewed   COMPREHENSIVE METABOLIC PANEL, NON-FASTING - Abnormal; Notable for the following components:       Result Value    GLUCOSE 110 (*)     All other components within normal limits    Narrative:     Estimated Glomerular Filtration Rate (eGFR) is calculated using the CKD-EPI (2021) equation, intended for patients 46 years of age and older. If gender is not documented or "unknown", there will be no eGFR calculation.   PTT (PARTIAL THROMBOPLASTIN TIME) - Abnormal; Notable for the following components:    APTT 34.6 (*)     All other components within normal limits   CBC WITH DIFF - Abnormal; Notable for the  following components:    MCHC 31.8 (*)     RDW 17.0 (*)     LYMPHOCYTE % 21 (*)     All other components within normal limits   TROPONIN-I - Normal    Narrative:     Values received on females ranging between 12-15 ng/L MUST include the next serial troponin to review changes in the delta differences as the reference range for the Access II chemistry analyzer is lower than the established reference range.     TROPONIN-I - Normal    Narrative:     Values received on females ranging between 12-15 ng/L MUST include the next serial troponin to review changes in the delta differences as the reference range for the Access II chemistry analyzer is lower than the established reference range.     MAGNESIUM -  Normal   PT/INR - Normal    Narrative:     INR OF 2.0-3.0  RECOMMENDED FOR: PROPHYLAXIS/TREATMENT OF VENEOUS THROMBOSIS, PULMONARY EMBOLISM, PREVENTION OF SYSTEMIC EMBOLISM FROM ATRIAL FIBRILATION, MYOCARDIAL INFARCTION.    INR OF 2.5-3.5  RECOMMENDED FOR MECHANICAL PROSTHETIC HEART VALVES, RECURRENT SYSTEMIC EMBOLISM, RECURRENT MYOCARDIAL INFARCTION.     CBC/DIFF    Narrative:     The following orders were created for panel order CBC/DIFF.  Procedure                               Abnormality         Status                     ---------                               -----------         ------                     CBC WITH ZHYQ[[id number]]                Abnormal            Final result                 Please view results for these tests on the individual orders.   TROPONIN-I     XR AP MOBILE CHEST   Final Result by Edi, Radresults In (02/02 1445)   NO SIGNIFICANT CHANGE SINCE THE PRIOR EXAM.            Radiologist location ID: [place] Decision Making        Medical Decision Making  High complexity due to patient's presentation comorbidities and age.  Differential includes exacerbation heart failure, myocardial infarction, coronary artery disease, pneumonia, pneumothorax, chest wall pain.    Amount and/or Complexity of Data Reviewed  Labs: ordered.     Details: Labs reviewed and noted  Radiology: ordered.     Details: Chest x-ray reviewed and noted  ECG/medicine tests: ordered.     Details: EKG reviewed and noted  Discussion of management or test interpretation with external provider(s): Discussed with the patient's cardiologist, Dr.Javed and he will see her in the office next week.  Continue home medications, low-salt diet.  See discharge    Risk  OTC drugs.  Prescription drug management.      ED Course as of 08/05/22 1718  Fri Aug 05, 2022   1433 EKG at 2:26 p.m., read at 2:26 p.m.:  Accelerated junctional rhythm at 82, normal intervals and axis.  She was inferolateral ST depressions and  flips.   1643 Patient has small right pleural effusion per Radiology, appears like she has bilateral pleural effusions to me, negative cardiac enzymes, and normal renal function over the last 4 evaluations.  Give the patient Lasix here         Medications Administered in the ED   NS flush syringe (has no administration in time range)   NS flush syringe (has no administration in time range)   aspirin chewable tablet 324 mg (324 mg Oral Given 08/05/22 1505)   furosemide (LASIX) 10 mg/mL injection (40 mg Intravenous Given 08/05/22 1718)     Clinical Impression   Pleural effusion due to CHF (congestive heart failure) (CMS HCC) (Primary)   Congestive heart failure, unspecified HF chronicity, unspecified heart failure type (CMS HCC)   Chronic hypoxemic respiratory failure (CMS HCC)   Chronic obstructive pulmonary disease, unspecified COPD type (CMS HCC)   Continuous tobacco abuse       Disposition: Discharged       .Marland KitchenBretta Bang, DO

## 2022-08-05 NOTE — ED Nurses Note (Signed)
Patient given lasix at this time.

## 2022-08-06 DIAGNOSIS — R9431 Abnormal electrocardiogram [ECG] [EKG]: Secondary | ICD-10-CM

## 2022-08-06 DIAGNOSIS — I493 Ventricular premature depolarization: Secondary | ICD-10-CM

## 2022-08-06 LAB — ECG 12 LEAD
Calculated R Axis: 70 degrees
Calculated T Axis: -84 degrees
QRS Duration: 78 ms
QT Interval: 388 ms
QTC Calculation: 453 ms
Ventricular rate: 82 {beats}/min

## 2023-03-09 ENCOUNTER — Other Ambulatory Visit: Payer: Self-pay

## 2023-03-09 ENCOUNTER — Emergency Department
Admission: EM | Admit: 2023-03-09 | Discharge: 2023-03-09 | Disposition: A | Discharge status: Home / Self Care | Payer: Medicare (Managed Care) | Attending: Family | Admitting: Family

## 2023-03-09 ENCOUNTER — Emergency Department (HOSPITAL_BASED_OUTPATIENT_CLINIC_OR_DEPARTMENT_OTHER): Payer: Medicare (Managed Care)

## 2023-03-09 ENCOUNTER — Encounter (HOSPITAL_BASED_OUTPATIENT_CLINIC_OR_DEPARTMENT_OTHER): Payer: Self-pay

## 2023-03-09 DIAGNOSIS — J189 Pneumonia, unspecified organism: Secondary | ICD-10-CM | POA: Insufficient documentation

## 2023-03-09 DIAGNOSIS — K648 Other hemorrhoids: Secondary | ICD-10-CM

## 2023-03-09 DIAGNOSIS — I723 Aneurysm of iliac artery: Secondary | ICD-10-CM | POA: Insufficient documentation

## 2023-03-09 DIAGNOSIS — K644 Residual hemorrhoidal skin tags: Secondary | ICD-10-CM | POA: Insufficient documentation

## 2023-03-09 DIAGNOSIS — J90 Pleural effusion, not elsewhere classified: Secondary | ICD-10-CM | POA: Insufficient documentation

## 2023-03-09 DIAGNOSIS — K529 Noninfective gastroenteritis and colitis, unspecified: Secondary | ICD-10-CM | POA: Insufficient documentation

## 2023-03-09 LAB — CBC WITH DIFF
BASOPHIL #: 0.02 10*3/uL (ref 0.00–0.30)
BASOPHIL %: 0 % (ref 0–3)
EOSINOPHIL #: 0.03 10*3/uL (ref 0.00–0.80)
EOSINOPHIL %: 1 % (ref 1–7)
HCT: 38.6 % (ref 37.0–47.0)
HGB: 12.7 g/dL (ref 12.5–16.0)
LYMPHOCYTE #: 1.09 10*3/uL — ABNORMAL LOW (ref 1.10–5.00)
LYMPHOCYTE %: 21 % — ABNORMAL LOW (ref 25–45)
MCH: 29.7 pg (ref 27.0–32.0)
MCHC: 32.8 g/dL (ref 32.0–36.0)
MCV: 90.3 fL (ref 78.0–99.0)
MONOCYTE #: 0.5 10*3/uL (ref 0.00–1.30)
MONOCYTE %: 9 % (ref 0–12)
MPV: 10.3 fL (ref 7.4–10.4)
NEUTROPHIL #: 3.67 10*3/uL (ref 1.80–8.40)
NEUTROPHIL %: 69 % (ref 40–76)
PLATELETS: 145 10*3/uL (ref 140–440)
RBC: 4.27 10*6/uL (ref 4.20–5.40)
RDW: 17.2 % — ABNORMAL HIGH (ref 11.6–14.8)
WBC: 5.3 10*3/uL (ref 4.0–10.5)

## 2023-03-09 LAB — URINALYSIS, MACRO/MICRO
BILIRUBIN: NEGATIVE mg/dL
BLOOD: NEGATIVE mg/dL
GLUCOSE: NEGATIVE mg/dL
KETONES: NEGATIVE mg/dL
LEUKOCYTES: NEGATIVE WBCs/uL
NITRITE: NEGATIVE
PH: 6 (ref 4.6–8.0)
PROTEIN: NEGATIVE mg/dL
SPECIFIC GRAVITY: 1.015 (ref 1.003–1.035)
UROBILINOGEN: 0.2 mg/dL (ref 0.2–1.0)

## 2023-03-09 LAB — COMPREHENSIVE METABOLIC PANEL, NON-FASTING
ALBUMIN/GLOBULIN RATIO: 1.1 (ref 0.8–1.4)
ALBUMIN: 3.6 g/dL (ref 3.4–5.0)
ALKALINE PHOSPHATASE: 80 U/L (ref 46–116)
ALT (SGPT): 11 U/L (ref <=78)
ANION GAP: 10 mmol/L (ref 4–13)
AST (SGOT): 22 U/L (ref 15–37)
BILIRUBIN TOTAL: 1 mg/dL (ref 0.2–1.0)
BUN/CREA RATIO: 18
BUN: 15 mg/dL (ref 7–18)
CALCIUM, CORRECTED: 9.4 mg/dL
CALCIUM: 9.1 mg/dL (ref 8.5–10.1)
CHLORIDE: 101 mmol/L (ref 98–107)
CO2 TOTAL: 27 mmol/L (ref 21–32)
CREATININE: 0.85 mg/dL (ref 0.55–1.02)
ESTIMATED GFR: 72 mL/min/{1.73_m2} (ref >59)
GLOBULIN: 3.3
GLUCOSE: 91 mg/dL (ref 74–106)
OSMOLALITY, CALCULATED: 276 mOsm/kg (ref 270–290)
POTASSIUM: 4.3 mmol/L (ref 3.5–5.1)
PROTEIN TOTAL: 6.9 g/dL (ref 6.4–8.2)
SODIUM: 138 mmol/L (ref 136–145)

## 2023-03-09 LAB — OCCULT BLOOD (PRN ED USE ONLY): OCCULT BLOOD: NEGATIVE

## 2023-03-09 LAB — PTT (PARTIAL THROMBOPLASTIN TIME): APTT: 32.3 seconds (ref 25.0–38.0)

## 2023-03-09 LAB — PT/INR
INR: 0.99 (ref 0.84–1.10)
PROTHROMBIN TIME: 11.6 seconds (ref 9.8–12.7)

## 2023-03-09 LAB — LIPASE: LIPASE: 58 U/L (ref 16–77)

## 2023-03-09 LAB — LACTIC ACID LEVEL W/ REFLEX FOR LEVEL >2.0: LACTIC ACID: 1.1 mmol/L (ref 0.4–2.0)

## 2023-03-09 MED ORDER — IOHEXOL 350 MG IODINE/ML INTRAVENOUS SOLUTION
100.0000 mL | INTRAVENOUS | Status: AC
Start: 2023-03-09 — End: 2023-03-09
  Administered 2023-03-09: 100 mL via INTRAVENOUS

## 2023-03-09 MED ORDER — LEVOFLOXACIN 500 MG TABLET
500.0000 mg | ORAL_TABLET | ORAL | Status: DC
Start: 2023-03-09 — End: 2023-03-09
  Administered 2023-03-09: 500 mg via ORAL

## 2023-03-09 MED ORDER — LEVOFLOXACIN 500 MG TABLET
500.0000 mg | ORAL_TABLET | Freq: Every day | ORAL | 0 refills | Status: AC
Start: 2023-03-09 — End: 2023-03-19

## 2023-03-09 MED ORDER — ONDANSETRON 4 MG DISINTEGRATING TABLET
4.0000 mg | ORAL_TABLET | Freq: Three times a day (TID) | ORAL | 0 refills | Status: DC | PRN
Start: 2023-03-09 — End: 2023-10-26

## 2023-03-09 MED ORDER — LEVOFLOXACIN 500 MG TABLET
ORAL_TABLET | ORAL | Status: AC
Start: 2023-03-09 — End: 2023-03-09
  Filled 2023-03-09: qty 1

## 2023-03-09 NOTE — ED Nurses Note (Signed)
Patient discharged home with family. Reviewed instructions and prescriptions with patient. Questions sufficiently answered as needed. Patient verbalized understanding of instructions and prescriptions. Patient encouraged to follow up with PCP as indicated. In the event of an emergency, patient instructed to call 911 or go to the nearest emergency room. Patient off the unit by wheelchair.

## 2023-03-09 NOTE — ED Provider Notes (Signed)
[facility], [facility] Emergency Department  ED Primary Provider Note  History of Present Illness   No chief complaint on file.    Misty Johnson is a 72 y.o. female who had no chief complaint listed for this encounter.  Arrival: The patient arrived by Car    Patient is a 72 year old female to the emergency department complaining of abdominal cramping sent by primary care provider for positive occult stool.  Patient states she has had dark stools over the past day with cramping.  Patient denies nausea or vomiting, diarrhea, history of gastric ulcers, but does complain of hemorrhoids.  Patient states sent to rule out GI bleed.  Patient denies history of GI bleed in the past.  Patient does not follow up currently with a GI specialist but has had abnormal colonoscopies in the past with removal of polyps approximately 5 years prior to arrival.      History Reviewed This Encounter:     Physical Exam   ED Triage Vitals [03/09/23 1243]   BP (Non-Invasive) (!) 129/39   Heart Rate 54   Respiratory Rate 20   Temperature 36.1 C (97 F)   SpO2 94 %   Weight 71.2 kg (157 lb)   Height 1.626 m (5\' 4" )     Physical Exam  Vitals and nursing note reviewed.   Constitutional:       General: She is not in acute distress.     Appearance: She is well-developed.   HENT:      Head: Normocephalic and atraumatic.   Eyes:      Conjunctiva/sclera: Conjunctivae normal.   Cardiovascular:      Rate and Rhythm: Normal rate and regular rhythm.      Heart sounds: No murmur heard.  Pulmonary:      Effort: Pulmonary effort is normal. No respiratory distress.      Breath sounds: Normal breath sounds.   Abdominal:      Palpations: Abdomen is soft.      Tenderness: There is no abdominal tenderness.   Musculoskeletal:         General: No swelling.      Cervical back: Neck supple.   Skin:     General: Skin is warm and dry.      Capillary Refill: Capillary refill takes less than 2 seconds.   Neurological:      Mental Status:  She is alert.   Psychiatric:         Mood and Affect: Mood normal.       Patient Data     Labs Ordered/Reviewed   CBC WITH DIFF - Abnormal; Notable for the following components:       Result Value    RDW 17.2 (*)     LYMPHOCYTE % 21 (*)     LYMPHOCYTE # 1.09 (*)     All other components within normal limits   OCCULT BLOOD (PRN ED USE ONLY) - Normal   LACTIC ACID LEVEL W/ REFLEX FOR LEVEL >2.0 - Normal   LIPASE - Normal   PT/INR - Normal    Narrative:     INR OF 2.0-3.0  RECOMMENDED FOR: PROPHYLAXIS/TREATMENT OF VENEOUS THROMBOSIS, PULMONARY EMBOLISM, PREVENTION OF SYSTEMIC EMBOLISM FROM ATRIAL FIBRILATION, MYOCARDIAL INFARCTION.    INR OF 2.5-3.5  RECOMMENDED FOR MECHANICAL PROSTHETIC HEART VALVES, RECURRENT SYSTEMIC EMBOLISM, RECURRENT MYOCARDIAL INFARCTION.     PTT (PARTIAL THROMBOPLASTIN TIME) - Normal   URINALYSIS, MACRO/MICRO - Normal   CBC/DIFF    Narrative:  The following orders were created for panel order CBC/DIFF.  Procedure                               Abnormality         Status                     ---------                               -----------         ------                     CBC WITH QION[[id number]]                Abnormal            Final result                 Please view results for these tests on the individual orders.   COMPREHENSIVE METABOLIC PANEL, NON-FASTING    Narrative:     Estimated Glomerular Filtration Rate (eGFR) is calculated using the CKD-EPI (2021) equation, intended for patients 60 years of age and older. If gender is not documented or "unknown", there will be no eGFR calculation.   URINALYSIS WITH REFLEX MICROSCOPIC AND CULTURE IF POSITIVE    Narrative:     The following orders were created for panel order URINALYSIS WITH REFLEX MICROSCOPIC AND CULTURE IF POSITIVE.  Procedure                               Abnormality         Status                     ---------                               -----------         ------                     URINALYSIS, MACRO/MICRO[[id number]]       Normal              Final result                 Please view results for these tests on the individual orders.     CT ABDOMEN PELVIS W IV CONTRAST   Final Result by Edi, Radresults In (09/05 1610)      1. Possible mild enteritis in the upper abdomen. No other acute abnormality in the abdomen or pelvis.       2. Trace aspiration versus small airway infection in the right and to a lesser extent left lung bases. Trace right pleural effusion.       3. 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      Impressions communicated to FNP-BC Zadyn Yardley (ED) by Dr. Pandora Leiter (radiology) at 3:05 on 03/09/2023.          One or more dose reduction techniques were used (e.g., Automated exposure control, adjustment of the mA and/or kV according to patient size, use of iterative reconstruction technique).      Radiologist location ID: [id number]  Radiologist location ID: [id number]           Medical Decision Making        Medical Decision Making  Patient is a 72 year old female to the emergency department complaining of abdominal cramping sent by primary care provider for positive occult stool.  Patient states she has had dark stools over the past day with cramping. patient has additionally been taking Pepto-Bismol..  Patient denies nausea or vomiting, diarrhea, history of gastric ulcers, but does complain of hemorrhoids.  Patient states sent to rule out GI bleed.  Patient denies history of GI bleed in the past.  Patient does not follow up currently with a GI specialist but has had abnormal colonoscopies in the past with removal of polyps approximately 5 years prior to arrival.  Differential diagnosis include but are not limited to hemorrhoids, GI bleed, enteritis.  On physical examination diffuse abdominal pain noted.  Patient does have multiple hemorrhoids on rectal examination that are not bleeding currently.  Fecal occult stool ordered and returned negative.  Additionally  no acute findings on lab work.  CT abdomen and pelvis does show likely enteritis and then potential pneumonia, aspiration.  Nonacute findings include possible small aneurysm of the iliac artery.  Discussed findings with patient and patient will follow-up with primary care for following of the iliac artery aneurysm.  Patient will follow up with GI for further colonoscopy.  Patient placed on Levaquin for both enteritis and aspiration pneumonia.  Patient was to return to ED if worsening of symptoms otherwise follow up with PCP.    Amount and/or Complexity of Data Reviewed  Labs: ordered.  Radiology: ordered.    Risk  Prescription drug management.      ED Course as of 03/09/23 1647   Thu Mar 09, 2023   1342 Occult stool collected and several internal and external hemorrhoids noted            Medications Administered in the ED   levoFLOXacin (LEVAQUIN) tablet (500 mg Oral Given 03/09/23 1623)   iohexol (OMNIPAQUE 350) infusion (100 mL Intravenous Given 03/09/23 1438)     Clinical Impression   Enteritis (Primary)   Pneumonia       Disposition: Discharged

## 2023-03-09 NOTE — ED Nurses Note (Signed)
Pt c/o abdominal pain since last night. Pain 10/10. Stated last BM this AM that was black in color. Denies NVD. Reports hx of colon polyps from last colonoscopy approx 5 yrs ago. Reports hx of hemorrhoids. Pt declines any medication for pain at this time.

## 2023-03-09 NOTE — ED Triage Notes (Signed)
Patient complains of stomach pain ongoing since last night. Patient reports bowel movement this morning that was black. Patient denies nausea/vomiting.

## 2023-03-22 ENCOUNTER — Encounter (HOSPITAL_BASED_OUTPATIENT_CLINIC_OR_DEPARTMENT_OTHER): Payer: Self-pay

## 2023-03-22 ENCOUNTER — Emergency Department
Admission: EM | Admit: 2023-03-22 | Discharge: 2023-03-22 | Disposition: A | Discharge status: Home / Self Care | Payer: Medicare (Managed Care)

## 2023-03-22 ENCOUNTER — Emergency Department (HOSPITAL_BASED_OUTPATIENT_CLINIC_OR_DEPARTMENT_OTHER): Payer: Medicare (Managed Care)

## 2023-03-22 ENCOUNTER — Other Ambulatory Visit: Payer: Self-pay

## 2023-03-22 DIAGNOSIS — Z8701 Personal history of pneumonia (recurrent): Secondary | ICD-10-CM | POA: Insufficient documentation

## 2023-03-22 DIAGNOSIS — R0602 Shortness of breath: Secondary | ICD-10-CM

## 2023-03-22 DIAGNOSIS — I459 Conduction disorder, unspecified: Secondary | ICD-10-CM

## 2023-03-22 DIAGNOSIS — R9431 Abnormal electrocardiogram [ECG] [EKG]: Secondary | ICD-10-CM | POA: Insufficient documentation

## 2023-03-22 DIAGNOSIS — J441 Chronic obstructive pulmonary disease with (acute) exacerbation: Secondary | ICD-10-CM | POA: Insufficient documentation

## 2023-03-22 DIAGNOSIS — J069 Acute upper respiratory infection, unspecified: Secondary | ICD-10-CM | POA: Insufficient documentation

## 2023-03-22 DIAGNOSIS — R001 Bradycardia, unspecified: Secondary | ICD-10-CM | POA: Insufficient documentation

## 2023-03-22 DIAGNOSIS — I517 Cardiomegaly: Secondary | ICD-10-CM | POA: Insufficient documentation

## 2023-03-22 DIAGNOSIS — Z1152 Encounter for screening for COVID-19: Secondary | ICD-10-CM | POA: Insufficient documentation

## 2023-03-22 LAB — MANUAL DIFFERENTIAL
BASOPHIL %: 1 % (ref 0–3)
BASOPHIL ABSOLUTE: 0.06 10*3/uL (ref 0.00–0.30)
BASOPHILS MANUAL: 1
EOSINOPHIL %: 1 % (ref 0–7)
EOSINOPHIL ABSOLUTE: 0.06 10*3/uL (ref 0.00–0.80)
EOSINOPHILS MANUAL: 1
LYMPHOCYTE %: 15 % — ABNORMAL LOW (ref 25–45)
LYMPHOCYTE ABSOLUTE: 0.95 10*3/uL — ABNORMAL LOW (ref 1.10–5.00)
LYMPHOCYTES MANUAL: 15
MONOCYTE %: 7 % (ref 0–12)
MONOCYTE ABSOLUTE: 0.44 10*3/uL (ref 0.00–1.30)
MONOCYTES MANUAL: 7
NEUTROPHIL %: 76 % (ref 40–76)
NEUTROPHIL ABSOLUTE: 4.79 10*3/uL (ref 1.80–8.40)
NEUTROPHILS MANUAL: 76
PLATELET MORPHOLOGY COMMENT: DECREASED
TOTAL CELLS COUNTED [#] IN BLOOD: 100
WBC: 6.3 10*3/uL

## 2023-03-22 LAB — PT/INR
INR: 0.97 (ref 0.84–1.10)
PROTHROMBIN TIME: 11.3 seconds (ref 9.8–12.7)

## 2023-03-22 LAB — COMPREHENSIVE METABOLIC PANEL, NON-FASTING
ALBUMIN/GLOBULIN RATIO: 1.1 (ref 0.8–1.4)
ALBUMIN: 3.2 g/dL — ABNORMAL LOW (ref 3.4–5.0)
ALKALINE PHOSPHATASE: 66 U/L (ref 46–116)
ALT (SGPT): 11 U/L (ref <=78)
ANION GAP: 7 mmol/L (ref 4–13)
AST (SGOT): 18 U/L (ref 15–37)
BILIRUBIN TOTAL: 0.7 mg/dL (ref 0.2–1.0)
BUN/CREA RATIO: 20
BUN: 16 mg/dL (ref 7–18)
CALCIUM, CORRECTED: 9.4 mg/dL
CALCIUM: 8.8 mg/dL (ref 8.5–10.1)
CHLORIDE: 103 mmol/L (ref 98–107)
CO2 TOTAL: 31 mmol/L (ref 21–32)
CREATININE: 0.8 mg/dL (ref 0.55–1.02)
ESTIMATED GFR: 78 mL/min/{1.73_m2} (ref >59)
GLOBULIN: 2.8
GLUCOSE: 108 mg/dL — ABNORMAL HIGH (ref 74–106)
OSMOLALITY, CALCULATED: 283 mOsm/kg (ref 270–290)
POTASSIUM: 3.9 mmol/L (ref 3.5–5.1)
PROTEIN TOTAL: 6 g/dL — ABNORMAL LOW (ref 6.4–8.2)
SODIUM: 141 mmol/L (ref 136–145)

## 2023-03-22 LAB — CBC WITH DIFF
HCT: 33.9 % — ABNORMAL LOW (ref 37.0–47.0)
HGB: 11.4 g/dL — ABNORMAL LOW (ref 12.5–16.0)
MCH: 30 pg (ref 27.0–32.0)
MCHC: 33.5 g/dL (ref 32.0–36.0)
MCV: 89.7 fL (ref 78.0–99.0)
MPV: 9.8 fL (ref 7.4–10.4)
PLATELETS: 113 10*3/uL — ABNORMAL LOW (ref 140–440)
RBC: 3.78 10*6/uL — ABNORMAL LOW (ref 4.20–5.40)
RDW: 16.6 % — ABNORMAL HIGH (ref 11.6–14.8)
WBC: 6.3 10*3/uL (ref 4.0–10.5)

## 2023-03-22 LAB — ECG 12 LEAD
Atrial Rate: 58 {beats}/min
Calculated P Axis: -21 degrees
Calculated R Axis: 19 degrees
Calculated T Axis: 176 degrees
PR Interval: 122 ms
QRS Duration: 82 ms
QT Interval: 418 ms
QTC Calculation: 410 ms
Ventricular rate: 58 {beats}/min

## 2023-03-22 LAB — PTT (PARTIAL THROMBOPLASTIN TIME): APTT: 33.2 seconds (ref 25.0–38.0)

## 2023-03-22 LAB — ARTERIAL BLOOD GAS/LACTATE
%FIO2 (ARTERIAL): 32 %
BASE EXCESS (ARTERIAL): 6.7 mmol/L — ABNORMAL HIGH (ref 0.0–2.0)
BICARBONATE (ARTERIAL): 31.3 mmol/L — ABNORMAL HIGH (ref 20.0–26.0)
CARBOXYHEMOGLOBIN: 4.5 % — ABNORMAL HIGH (ref <=1.5)
LACTATE: 0.6 mmol/L (ref <=2.0)
MET-HEMOGLOBIN: 0.2 % (ref <=2.0)
O2CT: 14.4 %
OXYHEMOGLOBIN: 89.1 % (ref 88.0–100.0)
PCO2 (ARTERIAL): 44 mm/Hg (ref 35–45)
PH (ARTERIAL): 7.46 — ABNORMAL HIGH (ref 7.35–7.45)
PO2 (ARTERIAL): 60 mm/Hg — ABNORMAL LOW (ref 80–100)

## 2023-03-22 LAB — NT-PROBNP: NT-PROBNP: 1446 pg/mL — ABNORMAL HIGH (ref <=125)

## 2023-03-22 LAB — COVID-19, FLU A/B, RSV RAPID BY PCR
INFLUENZA VIRUS TYPE A: NOT DETECTED
INFLUENZA VIRUS TYPE B: NOT DETECTED
RESPIRATORY SYNCTIAL VIRUS (RSV): NOT DETECTED
SARS-CoV-2: NOT DETECTED

## 2023-03-22 LAB — LACTIC ACID LEVEL W/ REFLEX FOR LEVEL >2.0: LACTIC ACID: 1 mmol/L (ref 0.4–2.0)

## 2023-03-22 MED ORDER — ALBUTEROL SULFATE 2.5 MG/3 ML (0.083 %) SOLUTION FOR NEBULIZATION
INHALATION_SOLUTION | RESPIRATORY_TRACT | Status: AC
Start: 2023-03-22 — End: 2023-03-22
  Filled 2023-03-22: qty 3

## 2023-03-22 MED ORDER — IPRATROPIUM 0.5 MG-ALBUTEROL 3 MG (2.5 MG BASE)/3 ML NEBULIZATION SOLN
3.0000 mL | INHALATION_SOLUTION | RESPIRATORY_TRACT | Status: DC
Start: 2023-03-22 — End: 2023-03-22

## 2023-03-22 MED ORDER — METHYLPREDNISOLONE 4 MG TABLETS IN A DOSE PACK
ORAL_TABLET | ORAL | 0 refills | Status: DC
Start: 2023-03-22 — End: 2023-10-26

## 2023-03-22 MED ORDER — ALBUTEROL SULFATE 2.5 MG/3 ML (0.083 %) SOLUTION FOR NEBULIZATION
2.5000 mg | INHALATION_SOLUTION | RESPIRATORY_TRACT | Status: AC
Start: 2023-03-22 — End: 2023-03-22
  Administered 2023-03-22: 2.5 mg via RESPIRATORY_TRACT

## 2023-03-22 MED ORDER — IPRATROPIUM 0.5 MG-ALBUTEROL 3 MG (2.5 MG BASE)/3 ML NEBULIZATION SOLN
INHALATION_SOLUTION | RESPIRATORY_TRACT | Status: AC
Start: 2023-03-22 — End: 2023-03-22
  Filled 2023-03-22: qty 3

## 2023-03-22 MED ORDER — IPRATROPIUM 0.5 MG-ALBUTEROL 3 MG (2.5 MG BASE)/3 ML NEBULIZATION SOLN
3.0000 mL | INHALATION_SOLUTION | RESPIRATORY_TRACT | Status: AC
Start: 2023-03-22 — End: 2023-03-22
  Administered 2023-03-22: 3 mL via RESPIRATORY_TRACT

## 2023-03-22 MED ORDER — AZITHROMYCIN 250 MG TABLET
ORAL_TABLET | ORAL | 0 refills | Status: DC
Start: 2023-03-22 — End: 2023-10-26

## 2023-03-22 NOTE — ED Nurses Note (Signed)
Provider in room and went over test results with patient and plan for some additional treatments.

## 2023-03-22 NOTE — ED Triage Notes (Signed)
Patient states started yesterday SOB and mid back pain. Daughter states she just getting over pneumonia.

## 2023-03-22 NOTE — ED Nurses Note (Signed)
Patient has been up to bedside commode with little assistance.

## 2023-03-22 NOTE — ED Nurses Note (Signed)
Patient assisted up to bedside commode. Patient provided sandwich tray and water to drink.

## 2023-03-22 NOTE — Discharge Instructions (Signed)

## 2023-03-22 NOTE — ED Nurses Note (Addendum)
Patient feels better then when she came this afternoon. She feels like she is breathing better. Patient discharged home all instructions gone over with patient including medications with opportunity to ask questions. Patient verbalized understanding and taken by wheelchair to front entrance and assisted into car with family.

## 2023-03-23 ENCOUNTER — Encounter (HOSPITAL_BASED_OUTPATIENT_CLINIC_OR_DEPARTMENT_OTHER): Payer: Self-pay

## 2023-03-23 NOTE — ED Provider Notes (Signed)
[facility]  ED Primary Provider Note  Patient Name: Misty Johnson  Patient Age: 72 y.o.  Date of Birth: [birth year]    Chief Complaint: Back Pain and Shortness of Breath        History of Present Illness       Misty Johnson is a 72 y.o. female who had concerns including Back Pain and Shortness of Breath.  Patient reports that she became short of breath with mild back pain that started yesterday.  Her daughter reports recent history of pneumonia.  Patient denies chest pain nausea vomiting fever chills.      History provided by:  Patient and caregiver  Back Pain  Location:  Lumbar spine  Quality:  Aching  Radiates to:  Does not radiate  Pain severity:  Mild  Pain is:  Same all the time  Timing:  Sporadic  Progression:  Partially resolved  Shortness of Breath       Review of Systems     No other overt Review of Systems are noted to be positive except noted in the HPI.      Historical Data   History Reviewed This Encounter: Medical History  Surgical History  Family History  Social History      Physical Exam   ED Triage Vitals [03/22/23 1315]   BP (Non-Invasive) (!) 119/35   Heart Rate 68   Respiratory Rate 20   Temperature 36.2 C (97.1 F)   SpO2 90 %   Weight 72.1 kg (159 lb)   Height 1.626 m (5\' 4" )       Physical Exam  Constitutional:       Appearance: Normal appearance. She is normal weight.   HENT:      Head: Normocephalic and atraumatic.      Mouth/Throat:      Mouth: Mucous membranes are moist.      Pharynx: Oropharynx is clear.   Eyes:      Pupils: Pupils are equal, round, and reactive to light.   Cardiovascular:      Rate and Rhythm: Normal rate and regular rhythm.      Pulses: Normal pulses.      Heart sounds: Normal heart sounds.   Pulmonary:      Effort: Respiratory distress present.      Breath sounds: Examination of the right-upper field reveals wheezing. Examination of the left-upper field reveals wheezing. Examination of the right-lower field reveals wheezing.  Examination of the left-lower field reveals wheezing. Wheezing present.   Abdominal:      General: Abdomen is flat. Bowel sounds are normal.      Palpations: Abdomen is soft.   Musculoskeletal:         General: Normal range of motion.      Cervical back: Normal range of motion and neck supple.   Skin:     General: Skin is warm and dry.      Capillary Refill: Capillary refill takes less than 2 seconds.   Neurological:      Mental Status: She is alert and oriented to person, place, and time.   Psychiatric:         Mood and Affect: Mood normal.         Behavior: Behavior normal.         Patient Data     Labs Ordered/Reviewed   ARTERIAL BLOOD GAS/LACTATE - Abnormal; Notable for the following components:       Result Value    PH (ARTERIAL)  7.46 (*)     BICARBONATE (ARTERIAL) 31.3 (*)     BASE EXCESS (ARTERIAL) 6.7 (*)     CARBOXYHEMOGLOBIN 4.5 (*)     PO2 (ARTERIAL) 60 (*)     All other components within normal limits   NT-PROBNP - Abnormal; Notable for the following components:    NT-PROBNP 1,446 (*)     All other components within normal limits   COMPREHENSIVE METABOLIC PANEL, NON-FASTING - Abnormal; Notable for the following components:    ALBUMIN 3.2 (*)     GLUCOSE 108 (*)     PROTEIN TOTAL 6.0 (*)     All other components within normal limits    Narrative:     Estimated Glomerular Filtration Rate (eGFR) is calculated using the CKD-EPI (2021) equation, intended for patients 73 years of age and older. If gender is not documented or "unknown", there will be no eGFR calculation.   CBC WITH DIFF - Abnormal; Notable for the following components:    RBC 3.78 (*)     HGB 11.4 (*)     HCT 33.9 (*)     RDW 16.6 (*)     PLATELETS 113 (*)     All other components within normal limits   MANUAL DIFFERENTIAL - Abnormal; Notable for the following components:    LYMPHOCYTE % 15 (*)     LYMPHOCYTE ABSOLUTE 0.95 (*)     All other components within normal limits   LACTIC ACID LEVEL W/ REFLEX FOR LEVEL >2.0 - Normal   COVID-19, FLU  A/B, RSV RAPID BY PCR - Normal    Narrative:     Results are for the simultaneous qualitative identification of SARS-CoV-2 (formerly 2019-nCoV), Influenza A, Influenza B, and RSV RNA. These etiologic agents are generally detectable in nasopharyngeal and nasal swabs during the ACUTE PHASE of infection. Hence, this test is intended to be performed on respiratory specimens collected from individuals with signs and symptoms of upper respiratory tract infection who meet Centers for Disease Control and Prevention (CDC) clinical and/or epidemiological criteria for Coronavirus Disease 2019 (COVID-19) testing. CDC COVID-19 criteria for testing on human specimens is available at [facility] webpage information for Healthcare Professionals: Coronavirus Disease 2019 (COVID-19) ([employer]).     False-negative results may occur if the virus has genomic mutations, insertions, deletions, or rearrangements or if performed very early in the course of illness. Otherwise, negative results indicate virus specific RNA targets are not detected, however negative results do not preclude SARS-CoV-2 infection/COVID-19, Influenza, or Respiratory syncytial virus infection. Results should not be used as the sole basis for patient management decisions. Negative results must be combined with clinical observations, patient history, and epidemiological information. If upper respiratory tract infection is still suspected based on exposure history together with other clinical findings, re-testing should be considered.    Test methodology:   Cepheid Xpert Xpress SARS-CoV-2/Flu/RSV Assay real-time polymerase chain reaction (RT-PCR) test on the GeneXpert Dx and Xpert Xpress systems.   PT/INR - Normal    Narrative:     INR OF 2.0-3.0  RECOMMENDED FOR: PROPHYLAXIS/TREATMENT OF VENEOUS THROMBOSIS, PULMONARY EMBOLISM, PREVENTION OF SYSTEMIC EMBOLISM FROM ATRIAL FIBRILATION, MYOCARDIAL INFARCTION.    INR OF  2.5-3.5  RECOMMENDED FOR MECHANICAL PROSTHETIC HEART VALVES, RECURRENT SYSTEMIC EMBOLISM, RECURRENT MYOCARDIAL INFARCTION.     PTT (PARTIAL THROMBOPLASTIN TIME) - Normal   ADULT ROUTINE BLOOD CULTURE, SET OF 2 BOTTLES (BACTERIA AND YEAST)   ADULT ROUTINE BLOOD CULTURE, SET OF 2 BOTTLES (BACTERIA AND YEAST)   CBC/DIFF  Narrative:     The following orders were created for panel order CBC/DIFF.  Procedure                               Abnormality         Status                     ---------                               -----------         ------                     CBC WITH DGUY[[id number]]                Abnormal            Final result               MANUAL DIFFERENTIAL[[id number]]          Abnormal            Final result                 Please view results for these tests on the individual orders.       XR CHEST PA AND LATERAL   Final Result by Edi, Radresults In (09/18 1444)      No likely acute infiltrates are seen. There is some probable atelectasis or scarring in the lung bases.      Substantial interval clearance of right lung base atelectasis or infiltrates and pleural effusion since 08/05/2022.       There are probably small residual pleural effusions.      Stable 50% compression deformity in the mid T-spine, with associated kyphosis.      This study does not show any gross right chest wall or rib abnormality.            Radiologist location ID: [id number]            Radiologist location ID: [id number]             Medical Decision Making          Medical Decision Making  Amount and/or Complexity of Data Reviewed  Labs: ordered.  Radiology: ordered. Decision-making details documented in ED Course.  ECG/medicine tests: ordered and independent interpretation performed.    Risk  Prescription drug management.          Studies Assessed and/or Ordered:  CBC CMP lactic acid BNP PT INR PTT blood cultures COVID flu RSV ABG 12 lead EKG chest x-ray.    EKG:   This EKG interpreted by me shows:    Rate:   58    Interpretation:  Sinus bradycardia rate of 58 beats per minute, no ST segment elevation noted.  Lateral ST segment depression noted, however this is present on previous studies as well.      MDM Narrative:    Chronic obstructive pulmonary disease exacerbation.  Upper respiratory tract infection.  Patient reports that she became short of breath with mild back pain that started yesterday.  Her daughter reports recent history of pneumonia.  Patient denies chest pain nausea vomiting fever chills.  CBC shows hemoglobin 11.4 hematocrit 33.9.  CMP shows albumin 3.2 glucose 108.  Lactic acid 1.0.  BNP 1446.  PT INR PTT unremarkable.  COVID flu RSV not detected.  G a 0.46 pCO2 44 bicarb 31.3 PO2 60.  Chest x-ray shows substantial interval clearance of right lung base atelectasis or infiltrates and pleural effusion since 08/05/2022 study.  There were probably small residual pleural effusions.  This study does not show any gross right chest wall or rib abnormality.  Patient received DuoNeb treatment with some relief of wheezing and work of breathing.  Patient received albuterol treatment with moderate improvement of lung sounds and work of breathing returned back to baseline.  Discussion held with the patient and her daughter at bedside concerning examined lab findings she is agreeable to current treatment plan she will be discharged home in stable condition with instructions to follow up primary care provider and return precautions given.          ED Course as of 03/23/23 0040   Wed Mar 22, 2023   1449 XR CHEST PA AND LATERAL  FINDINGS:     Heart size appears moderately enlarged.  Mediastinal width and pulmonary vascularity appear within normal limits.  There is some probable atelectasis in the lung bases, more so on the left. There is interval clearing on the right.  There are probably small pleural effusions, best seen as blunting of the posterior CP angles on lateral. That on the right is substantially decreased from  08/05/2022.   No acute bony lesion is seen. On lateral, there is moderate compression deformity in a midthoracic body, which is stable to 2022 CT.  Lateral doesn't show any other acute abnormality. There is moderate kyphosis.     IMPRESSION:     No likely acute infiltrates are seen. There is some probable atelectasis or scarring in the lung bases.     Substantial interval clearance of right lung base atelectasis or infiltrates and pleural effusion since 08/05/2022.      There are probably small residual pleural effusions.     Stable 50% compression deformity in the mid T-spine, with associated kyphosis.     This study does not show any gross right chest wall or rib abnormality.     1733 NT-PROBNP(!): 1,446         Medications Administered in the ED   ipratropium-albuterol 0.5 mg-3 mg(2.5 mg base)/3 mL Solution for Nebulization (3 mL Nebulization Given 03/22/23 1401)   albuterol (PROVENTIL) 2.5 mg / 3 mL (0.083%) neb solution (2.5 mg Nebulization Given 03/22/23 1640)       Following the history, physical exam, and ED workup, the patient was deemed stable and suitable for discharge. The patient/caregiver was advised to return to the ED for any new or worsening symptoms. Discharge medications, and follow-up instructions were discussed with the patient/caregiver in detail, who verbalizes understanding. The patient/caregiver is in agreement and is comfortable with the plan of care.    Disposition: Discharged         Current Discharge Medication List        START taking these medications.        Details   azithromycin 250 mg Tablet  Commonly known as: ZITHROMAX   Take 500 mg (2 tab) on day 1; take 250 mg (1 tab) on days 2-5.  Qty: 6 Tablet  Refills: 0     Methylprednisolone 4 mg Tablets, Dose Pack  Commonly known as: MEDROL DOSEPACK   Take as instructed.  Qty: 21 Tablet  Refills: 0            CONTINUE these medications - NO CHANGES were made during  your visit.        Details   albuterol sulfate 2.5 mg /3 mL (0.083 %) nebulizer  solution  Commonly known as: PROVENTIL   2.5 mg, Nebulization, DAILY PRN  Refills: 0     aspirin 81 mg Tablet, Chewable   81 mg, Oral, DAILY  Refills: 0     atorvastatin 40 mg Tablet  Commonly known as: LIPITOR   40 mg, Oral, EVERY EVENING  Refills: 0     ergocalciferol (vitamin D2) 1,250 mcg (50,000 unit) Capsule  Commonly known as: DRISDOL   50,000 Units, Oral, EVERY 7 DAYS  Refills: 0     escitalopram oxalate 20 mg Tablet  Commonly known as: LEXAPRO   20 mg, Oral, DAILY  Refills: 0     furosemide 40 mg Tablet  Commonly known as: LASIX   40 mg, Oral, DAILY  Refills: 0     ipratropium-albuteroL 0.5 mg-3 mg(2.5 mg base)/3 mL nebulizer solution  Commonly known as: DUONEB   3 mL, Nebulization, 4 TIMES DAILY  Refills: 0     Levocetirizine 5 mg Tablet  Commonly known as: XYZAL   5 mg, Oral, EVERY EVENING  Refills: 0     metoprolol succinate 25 mg Tablet Sustained Release 24 hr  Commonly known as: TOPROL-XL   25 mg, Oral, DAILY  Refills: 0     omeprazole 20 mg Capsule, Delayed Release(E.C.)  Commonly known as: PRILOSEC   20 mg, Oral, DAILY  Refills: 0     * ondansetron 4 mg Tablet, Rapid Dissolve  Commonly known as: ZOFRAN ODT   4 mg, Oral, EVERY 8 HOURS PRN  Qty: 12 Tablet  Refills: 0     * ondansetron 4 mg Tablet, Rapid Dissolve  Commonly known as: ZOFRAN ODT   4 mg, Oral, EVERY 8 HOURS PRN  Qty: 12 Tablet  Refills: 0     OXYGEN-AIR DELIVERY SYSTEMS DEACT   Does not apply, 3L NC at home  Refills: 0     polyethylene glycol 17 gram Powder in Packet  Commonly known as: MIRALAX   34 g, Oral, 2 TIMES DAILY  Refills: 0     potassium chloride 20 mEq Tab Sust.Rel. Particle/Crystal  Commonly known as: K-DUR   20 mEq, Oral, DAILY  Refills: 0     sennosides-docusate sodium 8.6-50 mg Tablet  Commonly known as: SENOKOT-S   2 Tablets, Oral, 2 TIMES DAILY  Refills: 0     Trelegy Ellipta 100-62.5-25 mcg Disk with Device  Generic drug: fluticasone-umeclidin-vilanter   1 Inhalation, Inhalation, DAILY  Refills: 0           * This list  has 2 medication(s) that are the same as other medications prescribed for you. Read the directions carefully, and ask your doctor or other care provider to review them with you.                ASK your doctor about these medications.        Details   levoFLOXacin 500 mg Tablet  Commonly known as: LEVAQUIN  Ask about: Should I take this medication?   500 mg, Oral, DAILY  Qty: 10 Tablet  Refills: 0            Follow up:   Stephenie Acres, MD  [address]  [place] [address]  [phone]      As needed    [facility], [facility] Emergency Department  [address]  [place]  [place] [address]  [phone]    If symptoms worsen               Clinical Impression   Chronic obstructive pulmonary disease with (acute) exacerbation (CMS HCC) (Primary)   Upper respiratory tract infection, unspecified type         Discharge Medication List as of 03/22/2023  5:37 PM        START taking these medications    Details   azithromycin (ZITHROMAX) 250 mg Oral Tablet Take 500 mg (2 tab) on day 1; take 250 mg (1 tab) on days 2-5., Disp-6 Tablet, R-0, E-Rx      Methylprednisolone (MEDROL DOSEPACK) 4 mg Oral Tablets, Dose Pack Take as instructed., Disp-21 Tablet, R-0, E-Rx               Cliffton Asters, FNP-BC  Department of Emergency Medicine  [facility]

## 2023-03-27 LAB — ADULT ROUTINE BLOOD CULTURE, SET OF 2 BOTTLES (BACTERIA AND YEAST)
BLOOD CULTURE, ROUTINE: NO GROWTH
BLOOD CULTURE, ROUTINE: NO GROWTH

## 2023-05-16 ENCOUNTER — Other Ambulatory Visit (INDEPENDENT_AMBULATORY_CARE_PROVIDER_SITE_OTHER): Payer: Self-pay | Admitting: Family

## 2023-05-16 DIAGNOSIS — N644 Mastodynia: Secondary | ICD-10-CM

## 2023-05-19 ENCOUNTER — Other Ambulatory Visit (HOSPITAL_COMMUNITY): Payer: Self-pay | Admitting: Family

## 2023-05-19 DIAGNOSIS — F17200 Nicotine dependence, unspecified, uncomplicated: Secondary | ICD-10-CM

## 2023-05-25 ENCOUNTER — Ambulatory Visit (HOSPITAL_COMMUNITY): Payer: Self-pay

## 2023-06-20 ENCOUNTER — Ambulatory Visit: Payer: Medicare (Managed Care)

## 2023-10-25 ENCOUNTER — Emergency Department (HOSPITAL_BASED_OUTPATIENT_CLINIC_OR_DEPARTMENT_OTHER): Payer: Medicare (Managed Care)

## 2023-10-25 ENCOUNTER — Encounter (HOSPITAL_BASED_OUTPATIENT_CLINIC_OR_DEPARTMENT_OTHER): Payer: Self-pay

## 2023-10-25 ENCOUNTER — Other Ambulatory Visit: Payer: Self-pay

## 2023-10-25 ENCOUNTER — Inpatient Hospital Stay
Admission: EM | Admit: 2023-10-25 | Discharge: 2023-10-29 | DRG: 291 | Disposition: A | Discharge status: Home / Self Care | Payer: Medicare (Managed Care) | Attending: HOSPITALIST | Admitting: HOSPITALIST

## 2023-10-25 ENCOUNTER — Inpatient Hospital Stay (HOSPITAL_COMMUNITY): Payer: Medicare (Managed Care)

## 2023-10-25 DIAGNOSIS — R0600 Dyspnea, unspecified: Secondary | ICD-10-CM | POA: Diagnosis present

## 2023-10-25 DIAGNOSIS — J9811 Atelectasis: Secondary | ICD-10-CM | POA: Diagnosis present

## 2023-10-25 DIAGNOSIS — F17210 Nicotine dependence, cigarettes, uncomplicated: Secondary | ICD-10-CM | POA: Diagnosis present

## 2023-10-25 DIAGNOSIS — J441 Chronic obstructive pulmonary disease with (acute) exacerbation: Principal | ICD-10-CM | POA: Diagnosis present

## 2023-10-25 DIAGNOSIS — I422 Other hypertrophic cardiomyopathy: Secondary | ICD-10-CM

## 2023-10-25 DIAGNOSIS — N179 Acute kidney failure, unspecified: Secondary | ICD-10-CM | POA: Diagnosis present

## 2023-10-25 DIAGNOSIS — F32A Depression, unspecified: Secondary | ICD-10-CM | POA: Diagnosis present

## 2023-10-25 DIAGNOSIS — Z9981 Dependence on supplemental oxygen: Secondary | ICD-10-CM

## 2023-10-25 DIAGNOSIS — I110 Hypertensive heart disease with heart failure: Principal | ICD-10-CM | POA: Diagnosis present

## 2023-10-25 DIAGNOSIS — I5030 Unspecified diastolic (congestive) heart failure: Secondary | ICD-10-CM | POA: Diagnosis present

## 2023-10-25 DIAGNOSIS — Z7982 Long term (current) use of aspirin: Secondary | ICD-10-CM

## 2023-10-25 DIAGNOSIS — E785 Hyperlipidemia, unspecified: Secondary | ICD-10-CM | POA: Diagnosis present

## 2023-10-25 DIAGNOSIS — J9621 Acute and chronic respiratory failure with hypoxia: Secondary | ICD-10-CM | POA: Diagnosis present

## 2023-10-25 DIAGNOSIS — I272 Pulmonary hypertension, unspecified: Secondary | ICD-10-CM | POA: Diagnosis present

## 2023-10-25 DIAGNOSIS — Z716 Tobacco abuse counseling: Secondary | ICD-10-CM

## 2023-10-25 DIAGNOSIS — I509 Heart failure, unspecified: Secondary | ICD-10-CM | POA: Diagnosis present

## 2023-10-25 DIAGNOSIS — I2510 Atherosclerotic heart disease of native coronary artery without angina pectoris: Secondary | ICD-10-CM | POA: Diagnosis present

## 2023-10-25 DIAGNOSIS — F17200 Nicotine dependence, unspecified, uncomplicated: Secondary | ICD-10-CM | POA: Insufficient documentation

## 2023-10-25 DIAGNOSIS — Z79899 Other long term (current) drug therapy: Secondary | ICD-10-CM

## 2023-10-25 LAB — COMPREHENSIVE METABOLIC PANEL, NON-FASTING
ALBUMIN/GLOBULIN RATIO: 1 (ref 0.8–1.4)
ALBUMIN: 3.4 g/dL (ref 3.4–5.0)
ALKALINE PHOSPHATASE: 87 U/L (ref 46–116)
ALT (SGPT): 10 U/L (ref <=78)
ANION GAP: 9 mmol/L (ref 4–13)
AST (SGOT): 16 U/L (ref 15–37)
BILIRUBIN TOTAL: 0.8 mg/dL (ref 0.2–1.0)
BUN/CREA RATIO: 16
BUN: 19 mg/dL — ABNORMAL HIGH (ref 7–18)
CALCIUM, CORRECTED: 9.6 mg/dL
CALCIUM: 9.1 mg/dL (ref 8.5–10.1)
CHLORIDE: 104 mmol/L (ref 98–107)
CO2 TOTAL: 29 mmol/L (ref 21–32)
CREATININE: 1.19 mg/dL — ABNORMAL HIGH (ref 0.55–1.02)
ESTIMATED GFR: 48 mL/min/{1.73_m2} — ABNORMAL LOW (ref >59)
GLOBULIN: 3.3
GLUCOSE: 120 mg/dL — ABNORMAL HIGH (ref 74–106)
OSMOLALITY, CALCULATED: 287 mosm/kg (ref 270–290)
POTASSIUM: 4.1 mmol/L (ref 3.5–5.1)
PROTEIN TOTAL: 6.7 g/dL (ref 6.4–8.2)
SODIUM: 142 mmol/L (ref 136–145)

## 2023-10-25 LAB — CBC WITH DIFF
BASOPHIL #: 0.02 10*3/uL (ref 0.00–0.10)
BASOPHIL %: 0 % (ref 0–1)
EOSINOPHIL #: 0.07 10*3/uL (ref 0.00–0.50)
EOSINOPHIL %: 1 % (ref 1–7)
HCT: 32.2 % (ref 31.2–41.9)
HGB: 10.2 g/dL — ABNORMAL LOW (ref 10.9–14.3)
LYMPHOCYTE #: 0.9 10*3/uL — ABNORMAL LOW (ref 1.10–3.10)
LYMPHOCYTE %: 13 % — ABNORMAL LOW (ref 16–46)
MCH: 27.9 pg (ref 24.7–32.8)
MCHC: 31.7 g/dL — ABNORMAL LOW (ref 32.3–35.6)
MCV: 88.1 fL (ref 75.5–95.3)
MONOCYTE #: 0.65 10*3/uL (ref 0.20–0.90)
MONOCYTE %: 9 % (ref 4–11)
MPV: 9.2 fL (ref 7.9–10.8)
NEUTROPHIL #: 5.4 10*3/uL (ref 1.90–8.20)
NEUTROPHIL %: 77 % (ref 43–77)
PLATELETS: 154 10*3/uL (ref 140–440)
RBC: 3.66 10*6/uL (ref 3.63–4.92)
RDW: 16.6 % (ref 12.3–17.7)
WBC: 7 10*3/uL (ref 3.8–11.8)

## 2023-10-25 LAB — BLOOD GAS W/ LYTES, LACTATE REFLEX
%FIO2 (ARTERIAL): 32 %
BASE EXCESS (ARTERIAL): 4.2 mmol/L — ABNORMAL HIGH (ref 0.0–3.0)
BICARBONATE (ARTERIAL): 28.1 mmol/L — ABNORMAL HIGH (ref 21.0–28.0)
CHLORIDE: 104 mmol/L (ref 98–107)
GLUCOSE: 119 mg/dL (ref 65–125)
IONIZED CALCIUM: 1.14 mmol/L — ABNORMAL LOW (ref 1.15–1.33)
LACTATE: 1.2 mmol/L (ref <=1.9)
O2 SATURATION (ARTERIAL): 94.7 % (ref 94.0–98.0)
PAO2/FIO2 RATIO: 219
PCO2 (ARTERIAL): 40 mmHg (ref 35–45)
PH (ARTERIAL): 7.46 — ABNORMAL HIGH (ref 7.35–7.45)
PO2 (ARTERIAL): 70 mmHg — ABNORMAL LOW (ref 83–108)
SODIUM: 136 mmol/L (ref 136–145)
WHOLE BLOOD POTASSIUM: 4 mmol/L (ref 3.5–5.1)

## 2023-10-25 LAB — SCAN DIFFERENTIAL
PLATELET MORPHOLOGY COMMENT: NORMAL
RBC MORPHOLOGY COMMENT: NORMAL

## 2023-10-25 LAB — TROPONIN-I: TROPONIN I: 15 ng/L — ABNORMAL HIGH (ref <15)

## 2023-10-25 LAB — NT-PROBNP: NT-PROBNP: 4757 pg/mL — ABNORMAL HIGH (ref <=125)

## 2023-10-25 MED ORDER — METHYLPREDNISOLONE SOD SUCC 125 MG SOLUTION FOR INJECTION WRAPPER
62.5000 mg | INTRAVENOUS | Status: DC
Start: 2023-10-25 — End: 2023-10-25

## 2023-10-25 MED ORDER — METHYLPREDNISOLONE SOD SUCC 125 MG SOLUTION FOR INJECTION WRAPPER
INTRAVENOUS | Status: AC
Start: 2023-10-25 — End: 2023-10-25
  Filled 2023-10-25: qty 2

## 2023-10-25 MED ORDER — IPRATROPIUM 0.5 MG-ALBUTEROL 3 MG (2.5 MG BASE)/3 ML NEBULIZATION SOLN
3.0000 mL | INHALATION_SOLUTION | RESPIRATORY_TRACT | Status: AC
Start: 2023-10-25 — End: 2023-10-25
  Administered 2023-10-25: 3 mL via RESPIRATORY_TRACT

## 2023-10-25 MED ORDER — ALBUTEROL SULFATE 2.5 MG/3 ML (0.083 %) SOLUTION FOR NEBULIZATION
2.5000 mg | INHALATION_SOLUTION | RESPIRATORY_TRACT | Status: AC
Start: 2023-10-25 — End: 2023-10-25
  Administered 2023-10-25: 2.5 mg via RESPIRATORY_TRACT

## 2023-10-25 MED ORDER — ALBUTEROL SULFATE 2.5 MG/3 ML (0.083 %) SOLUTION FOR NEBULIZATION
INHALATION_SOLUTION | RESPIRATORY_TRACT | Status: AC
Start: 2023-10-25 — End: 2023-10-25
  Filled 2023-10-25: qty 3

## 2023-10-25 MED ORDER — METHYLPREDNISOLONE SOD SUCC 125 MG SOLUTION FOR INJECTION WRAPPER
62.5000 mg | INTRAVENOUS | Status: AC
Start: 2023-10-25 — End: 2023-10-25
  Administered 2023-10-25: 62.5 mg via INTRAMUSCULAR

## 2023-10-25 MED ORDER — IPRATROPIUM 0.5 MG-ALBUTEROL 3 MG (2.5 MG BASE)/3 ML NEBULIZATION SOLN
INHALATION_SOLUTION | RESPIRATORY_TRACT | Status: AC
Start: 2023-10-25 — End: 2023-10-25
  Filled 2023-10-25: qty 3

## 2023-10-25 NOTE — ED Nurses Note (Addendum)
 Patient states that for the last month she has had increased shortness of breath and cough, this week  the symptoms have further gotten worse, patient states that has been coughing sputum intermittently, ranging from clear to yellow. Patient wears 3L NC at home. Patient still smokes. Patient has hx of COPD and CHF

## 2023-10-25 NOTE — ED Provider Notes (Signed)
 [facility]  ED Primary Provider Note  Patient Name: Misty Johnson  Patient Age: 73 y.o.  Date of Birth: 02-07-1951    Chief Complaint: Cough (Pt c/o cough with SOB for approx one month. Pt states that she just can't catch her breath and is coughing constantly. Pt wears O2 3LPM via NC at all times. O2 at time of triage is 84%)        History of Present Illness       Misty Johnson is a 73 y.o. female who had concerns including Cough.  73 year old female presents to emergency department patient reports cough for the past 3 weeks cough is gradually increased over the last week the most, bringing the patient to the emergency department today.  Patient reports cough is productive with a clear to yellow sputum.  Patient is a smoker half pack per day with a history of chronic obstructive pulmonary disease.  Family with patient reports prior to DuoNeb this afternoon patient is O2 saturations were in the high 80s.  After DuoNeb patient's SpO2 increased to 95-97% but rapidly decreased after that bringing patient to the ER.  Patient states that her shortness of breath has increased significantly today.  Patient uses oxygen at home also uses nebulizers at home.        Review of Systems     No other overt Review of Systems are noted to be positive except noted in the HPI.      Historical Data   History Reviewed This Encounter:  Subjective interview with patient and triage information.  Chart reviewed from hospital admission last September.      Physical Exam   ED Triage Vitals [10/25/23 1855]   BP (Non-Invasive) (!) 116/44   Heart Rate 78   Respiratory Rate (!) 21   Temperature 36.4 C (97.6 F)   SpO2 (!) 85 %   Weight 76.2 kg (168 lb)   Height 1.626 m (5\' 4" )     Nursing notes reviewed for what could be assessed. Past Medical, Surgical, and Social history reviewed for what has been completed.     Constitutional: NAD. Well-Developed. Well Nourished.  Head: Normocephalic,  atraumatic.  Mouth/Throat:  Pink and moist  Eyes: EOM grossly intact, conjunctiva normal Pupils PERRL bilateral.  Neck: Supple, No JVD, Trachea Midline  Cardiovascular: Regular Rate and Rhythm, extremities well perfused no carotid bruits bilateral.  Capillary refill is normal.    Pulmonary/Chest:  There is on physical exam increased work of breathing with supraclavicular retractions noted.  Breath sounds equal bilateral with expiratory wheezes bilateral in all fields with posterior auscultation.    Abdominal: Soft, non-tender, non-distended. Non peritoneal, no rebound, no guarding.  MSK: No Lower Extremity Edema. Moving all extremities with equal strength and purpose   Skin: Warm, dry, and normal  Neuro: Appropriate, CN II-XII grossly intact. Gait and movement exacerbates respiratory distress.  Psych: Pleasant       Procedures      Patient Data       No orders to display       Medical Decision Making           Studies Assessed:  Serum lab work ECG ABG BNP    EKG:   This independent EKG interpretation by that has noted below, this will be reviewed and documented separately by a cardiologist in the EMR.:    Rate:  Heart rate 71    Interpretation:  PR interval 80 milliseconds.  QRS 88 milliseconds,  QT 440 milliseconds.  Sinus rhythm without ST elevation suggesting acute coronary syndrome.      MDM Narrative:  73 year old female patient who is 1/2 pack per day smoker with a long history of smoking.  Patient uses oxygen and nebulizers at home with a history of chronic obstructive pulmonary disease.  Patient presents to emergency department with the appearance of an acute exacerbation of chronic obstructive pulmonary disease.  Patient does complain of cough for approximately 3 weeks increasing over the past week.  Cough is productive with a clear to yellow sputum.  Patient is noted to have expiratory wheezes bilateral in all fields with posterior auscultation.  Neck veins are flat trachea is midline.  Patient did utilize  a home nebulizer prior to arrival at the emergency department.  We will order ABG CBC CMP 12 lead ECG chest x-ray and re-evaluate patient.  DuoNeb also ordered.  IM Solu-Medrol .    Prior to shift departure bed request placed with nursing supervisor.  Patient handoff reports Dr. Hassie Lint.          Differential includes but not limited to:  Exacerbation of chronic obstructive pulmonary disease.  Pneumonia  CHF  Pulmonary embolism          Follow-Up Discussion:     Please see documentation above for specific labs and radiology.    Decision for and Complexity of Risk in the ED encounter:  I ordered prescription medications requiring authorization in the ED or at discharge.  I decided when to consult for hospitalization or escalation of hospital-level care.  Discussion of care with a provider outside of the Emergency Department:                              ED Course as of 10/25/23 2222   Wed Oct 25, 2023   1940 ABG reviewed.   2053 NT-PROBNP(!): 4,757   2053 NT-PROBNP(!)   2219 TROPONIN-I(!): 15  Troponin reviewed                    Patient will be admitted to the [facility] HOSPITALIST 1 service for further workup and management.    Disposition: Admitted             "Risk" as noted in the medical decision-making is in reference to potential morbidity/mortality of management based upon previously established billing guidelines.    Based upon the clinical setting, the likely diagnosis/impression include:  Congestive heart failure  Chronic obstructive pulmonary disease      Current Discharge Medication List                This note was partially created using voice recognition software and is inherently subject to errors including those of syntax and "sound alike " substitutions which may escape proof reading. In such instances, original meaning may be extrapolated by contextual derivation.

## 2023-10-25 NOTE — ED APP Handoff Note (Signed)
 Patient had off report to Dr. Hassie Lint.

## 2023-10-26 ENCOUNTER — Inpatient Hospital Stay (HOSPITAL_COMMUNITY): Payer: Medicare (Managed Care)

## 2023-10-26 ENCOUNTER — Encounter (HOSPITAL_BASED_OUTPATIENT_CLINIC_OR_DEPARTMENT_OTHER): Payer: Self-pay

## 2023-10-26 DIAGNOSIS — F17200 Nicotine dependence, unspecified, uncomplicated: Secondary | ICD-10-CM | POA: Insufficient documentation

## 2023-10-26 DIAGNOSIS — I509 Heart failure, unspecified: Secondary | ICD-10-CM | POA: Diagnosis present

## 2023-10-26 DIAGNOSIS — R9431 Abnormal electrocardiogram [ECG] [EKG]: Secondary | ICD-10-CM

## 2023-10-26 DIAGNOSIS — J9621 Acute and chronic respiratory failure with hypoxia: Secondary | ICD-10-CM

## 2023-10-26 DIAGNOSIS — I491 Atrial premature depolarization: Secondary | ICD-10-CM

## 2023-10-26 DIAGNOSIS — F32A Depression, unspecified: Secondary | ICD-10-CM

## 2023-10-26 DIAGNOSIS — N179 Acute kidney failure, unspecified: Secondary | ICD-10-CM

## 2023-10-26 DIAGNOSIS — J441 Chronic obstructive pulmonary disease with (acute) exacerbation: Secondary | ICD-10-CM

## 2023-10-26 LAB — ECG 12 LEAD
Atrial Rate: 71 {beats}/min
Calculated P Axis: 54 degrees
Calculated R Axis: 42 degrees
Calculated T Axis: 20 degrees
PR Interval: 80 ms
QRS Duration: 88 ms
QT Interval: 440 ms
QTC Calculation: 478 ms
Ventricular rate: 71 {beats}/min

## 2023-10-26 LAB — TROPONIN-I: TROPONIN I: 12 ng/L (ref <15)

## 2023-10-26 MED ORDER — BUDESONIDE 0.5 MG/2 ML SUSPENSION FOR NEBULIZATION
0.5000 mg | INHALATION_SUSPENSION | Freq: Every day | RESPIRATORY_TRACT | Status: DC
Start: 2023-10-26 — End: 2023-10-29
  Administered 2023-10-26 – 2023-10-29 (×4): 0.5 mg via RESPIRATORY_TRACT

## 2023-10-26 MED ORDER — GUAIFENESIN ER 600 MG TABLET, EXTENDED RELEASE 12 HR
600.0000 mg | EXTENDED_RELEASE_TABLET | Freq: Two times a day (BID) | ORAL | Status: DC
Start: 2023-10-26 — End: 2023-10-29
  Administered 2023-10-26 – 2023-10-29 (×6): 600 mg via ORAL
  Filled 2023-10-26 (×6): qty 1

## 2023-10-26 MED ORDER — FLUTICASONE FUR. 100 MCG-UMECLID 62.5 MCG-VILANT 25 MCG INHALAT.POWDER
1.0000 | DISK | Freq: Every day | RESPIRATORY_TRACT | Status: DC
Start: 2023-10-26 — End: 2023-10-26

## 2023-10-26 MED ORDER — NICOTINE 14 MG/24 HR DAILY TRANSDERMAL PATCH
14.0000 mg | MEDICATED_PATCH | Freq: Every day | TRANSDERMAL | Status: DC
Start: 2023-10-26 — End: 2023-10-29
  Administered 2023-10-26: 0 mg via TRANSDERMAL
  Administered 2023-10-27 – 2023-10-28 (×2): 14 mg via TRANSDERMAL
  Filled 2023-10-26 (×3): qty 1

## 2023-10-26 MED ORDER — GUAIFENESIN ER 600 MG TABLET, EXTENDED RELEASE 12 HR
EXTENDED_RELEASE_TABLET | ORAL | Status: AC
Start: 2023-10-26 — End: 2023-10-26
  Administered 2023-10-26: 600 mg via ORAL
  Filled 2023-10-26: qty 1

## 2023-10-26 MED ORDER — ASPIRIN 81 MG CHEWABLE TABLET
CHEWABLE_TABLET | ORAL | Status: AC
Start: 2023-10-26 — End: 2023-10-26
  Administered 2023-10-26: 81 mg via ORAL
  Filled 2023-10-26: qty 1

## 2023-10-26 MED ORDER — ESCITALOPRAM 20 MG TABLET
20.0000 mg | ORAL_TABLET | Freq: Every day | ORAL | Status: DC
Start: 2023-10-26 — End: 2023-10-29
  Administered 2023-10-27 – 2023-10-29 (×3): 20 mg via ORAL
  Filled 2023-10-26 (×3): qty 1

## 2023-10-26 MED ORDER — ASPIRIN 81 MG CHEWABLE TABLET
81.0000 mg | CHEWABLE_TABLET | Freq: Every day | ORAL | Status: DC
Start: 2023-10-26 — End: 2023-10-29
  Administered 2023-10-27 – 2023-10-29 (×3): 81 mg via ORAL
  Filled 2023-10-26 (×3): qty 1

## 2023-10-26 MED ORDER — ATORVASTATIN 40 MG TABLET
40.0000 mg | ORAL_TABLET | Freq: Every evening | ORAL | Status: DC
Start: 2023-10-26 — End: 2023-10-29
  Administered 2023-10-26 – 2023-10-28 (×3): 40 mg via ORAL
  Filled 2023-10-26 (×3): qty 1

## 2023-10-26 MED ORDER — GABAPENTIN 300 MG CAPSULE
ORAL_CAPSULE | ORAL | Status: AC
Start: 2023-10-26 — End: 2023-10-26
  Filled 2023-10-26: qty 1

## 2023-10-26 MED ORDER — METHYLPREDNISOLONE SOD SUCC 125 MG SOLUTION FOR INJECTION WRAPPER
INTRAVENOUS | Status: AC
Start: 2023-10-26 — End: 2023-10-26
  Administered 2023-10-26: 62.5 mg via INTRAVENOUS
  Filled 2023-10-26: qty 2

## 2023-10-26 MED ORDER — GABAPENTIN 300 MG CAPSULE
300.0000 mg | ORAL_CAPSULE | ORAL | Status: AC
Start: 2023-10-26 — End: 2023-10-26
  Administered 2023-10-26: 300 mg via ORAL

## 2023-10-26 MED ORDER — METHYLPREDNISOLONE SOD SUCC 125 MG SOLUTION FOR INJECTION WRAPPER
62.5000 mg | Freq: Four times a day (QID) | INTRAVENOUS | Status: DC
Start: 2023-10-26 — End: 2023-10-27
  Administered 2023-10-26 – 2023-10-27 (×3): 62.5 mg via INTRAVENOUS
  Filled 2023-10-26 (×3): qty 2

## 2023-10-26 MED ORDER — POTASSIUM CHLORIDE ER 20 MEQ TABLET,EXTENDED RELEASE(PART/CRYST)
ORAL_TABLET | ORAL | Status: AC
Start: 2023-10-26 — End: 2023-10-26
  Administered 2023-10-26: 20 meq via ORAL
  Filled 2023-10-26: qty 1

## 2023-10-26 MED ORDER — PANTOPRAZOLE 40 MG TABLET,DELAYED RELEASE
40.0000 mg | DELAYED_RELEASE_TABLET | Freq: Every day | ORAL | Status: DC
Start: 2023-10-26 — End: 2023-10-29
  Administered 2023-10-27 – 2023-10-29 (×3): 40 mg via ORAL
  Filled 2023-10-26 (×3): qty 1

## 2023-10-26 MED ORDER — POLYETHYLENE GLYCOL 3350 17 GRAM ORAL POWDER PACKET
34.0000 g | Freq: Two times a day (BID) | ORAL | Status: DC
Start: 2023-10-26 — End: 2023-10-29
  Administered 2023-10-26 – 2023-10-29 (×6): 34 g via ORAL
  Filled 2023-10-26 (×6): qty 2

## 2023-10-26 MED ORDER — MAGNESIUM OXIDE 400 MG (241.3 MG MAGNESIUM) TABLET
400.0000 mg | ORAL_TABLET | Freq: Two times a day (BID) | ORAL | Status: AC
Start: 2023-10-26 — End: 2023-10-27
  Administered 2023-10-26 – 2023-10-27 (×3): 400 mg via ORAL
  Filled 2023-10-26 (×3): qty 1

## 2023-10-26 MED ORDER — IPRATROPIUM 0.5 MG-ALBUTEROL 3 MG (2.5 MG BASE)/3 ML NEBULIZATION SOLN
3.0000 mL | INHALATION_SOLUTION | Freq: Four times a day (QID) | RESPIRATORY_TRACT | Status: DC
Start: 2023-10-26 — End: 2023-10-26

## 2023-10-26 MED ORDER — FUROSEMIDE 10 MG/ML INJECTION SOLUTION
INTRAMUSCULAR | Status: AC
Start: 2023-10-26 — End: 2023-10-26
  Filled 2023-10-26: qty 4

## 2023-10-26 MED ORDER — METOPROLOL SUCCINATE ER 25 MG TABLET,EXTENDED RELEASE 24 HR
ORAL_TABLET | ORAL | Status: AC
Start: 2023-10-26 — End: 2023-10-26
  Administered 2023-10-26: 25 mg via ORAL
  Filled 2023-10-26: qty 1

## 2023-10-26 MED ORDER — POTASSIUM CHLORIDE ER 20 MEQ TABLET,EXTENDED RELEASE(PART/CRYST)
20.0000 meq | ORAL_TABLET | Freq: Every day | ORAL | Status: DC
Start: 2023-10-26 — End: 2023-10-28
  Administered 2023-10-27: 20 meq via ORAL
  Filled 2023-10-26 (×2): qty 1

## 2023-10-26 MED ORDER — FUROSEMIDE 40 MG TABLET
ORAL_TABLET | ORAL | Status: AC
Start: 2023-10-26 — End: 2023-10-26
  Administered 2023-10-26: 40 mg via ORAL
  Filled 2023-10-26: qty 1

## 2023-10-26 MED ORDER — SODIUM CHLORIDE 0.9 % INJECTION SOLUTION
2.0000 mL | Freq: Once | INTRAVENOUS | Status: DC | PRN
Start: 2023-10-26 — End: 2023-10-29
  Administered 2023-10-26: 3 mL via INTRAVENOUS

## 2023-10-26 MED ORDER — ENOXAPARIN 40 MG/0.4 ML SUBCUTANEOUS SYRINGE
40.0000 mg | INJECTION | SUBCUTANEOUS | Status: DC
Start: 2023-10-26 — End: 2023-10-29
  Administered 2023-10-26 – 2023-10-29 (×4): 40 mg via SUBCUTANEOUS
  Filled 2023-10-26 (×4): qty 0.4

## 2023-10-26 MED ORDER — NICOTINE 14 MG/24 HR DAILY TRANSDERMAL PATCH
MEDICATED_PATCH | TRANSDERMAL | Status: DC
Start: 2023-10-26 — End: 2023-10-26
  Filled 2023-10-26: qty 1

## 2023-10-26 MED ORDER — FUROSEMIDE 40 MG TABLET
40.0000 mg | ORAL_TABLET | Freq: Every day | ORAL | Status: DC
Start: 2023-10-26 — End: 2023-10-29
  Administered 2023-10-27 – 2023-10-29 (×3): 40 mg via ORAL
  Filled 2023-10-26 (×3): qty 1

## 2023-10-26 MED ORDER — PANTOPRAZOLE 40 MG TABLET,DELAYED RELEASE
DELAYED_RELEASE_TABLET | ORAL | Status: AC
Start: 2023-10-26 — End: 2023-10-26
  Administered 2023-10-26: 40 mg via ORAL
  Filled 2023-10-26: qty 1

## 2023-10-26 MED ORDER — ESCITALOPRAM 20 MG TABLET
ORAL_TABLET | ORAL | Status: AC
Start: 2023-10-26 — End: 2023-10-26
  Administered 2023-10-26: 20 mg via ORAL
  Filled 2023-10-26: qty 1

## 2023-10-26 MED ORDER — METOPROLOL SUCCINATE ER 25 MG TABLET,EXTENDED RELEASE 24 HR
25.0000 mg | ORAL_TABLET | Freq: Every day | ORAL | Status: DC
Start: 2023-10-26 — End: 2023-10-29
  Administered 2023-10-27 – 2023-10-28 (×2): 25 mg via ORAL
  Administered 2023-10-29: 0 mg via ORAL
  Filled 2023-10-26 (×3): qty 1

## 2023-10-26 MED ORDER — POLYETHYLENE GLYCOL 3350 17 GRAM ORAL POWDER PACKET
ORAL | Status: AC
Start: 2023-10-26 — End: 2023-10-26
  Administered 2023-10-26: 34 g via ORAL
  Filled 2023-10-26: qty 2

## 2023-10-26 MED ORDER — FUROSEMIDE 10 MG/ML INJECTION SOLUTION
40.0000 mg | INTRAMUSCULAR | Status: AC
Start: 2023-10-26 — End: 2023-10-26
  Administered 2023-10-26: 40 mg via INTRAVENOUS

## 2023-10-26 MED ORDER — IPRATROPIUM 0.5 MG-ALBUTEROL 3 MG (2.5 MG BASE)/3 ML NEBULIZATION SOLN
3.0000 mL | INHALATION_SOLUTION | Freq: Four times a day (QID) | RESPIRATORY_TRACT | Status: DC
Start: 2023-10-26 — End: 2023-10-29
  Administered 2023-10-26 – 2023-10-29 (×13): 3 mL via RESPIRATORY_TRACT
  Filled 2023-10-26: qty 3

## 2023-10-26 MED ORDER — ACETAMINOPHEN 325 MG TABLET
650.0000 mg | ORAL_TABLET | ORAL | Status: DC | PRN
Start: 2023-10-26 — End: 2023-10-29
  Administered 2023-10-27: 650 mg via ORAL
  Filled 2023-10-26: qty 2

## 2023-10-26 NOTE — ED Nurses Note (Signed)
 Patient placed in gown, and prima fit device applied to patient.

## 2023-10-26 NOTE — H&P (Signed)
 [facility]  Admission H&P        Date of Service:  10/26/2023  Misty Johnson 73 y.o. female  Date of Admission:  10/25/2023  Date of Birth:  07-Jun-1951      Chief Complaint:    Chief Complaint   Patient presents with    Cough     Pt c/o cough with SOB for approx one month. Pt states that she just can't catch her breath and is coughing constantly. Pt wears O2 3LPM via NC at all times. O2 at time of triage is 84%       HPI: Misty Johnson is a 73 y.o., White female who presents with complaints of cough and increased shortness on breath.  The patient does have a long history of chronic obstructive pulmonary disease.  She wears O2 3 L nasal cannula continuously at home.  She reports that over the past couple of weeks she feels like her breathing has worsened.  She has had increased wheezing and coughing.  Daughter states that she has turned her O2 up to 4 L at home.  She does continue to smoke half a pack of cigarettes a day.  She reports a history of CHF, takes Lasix  40 mg p.o. daily at home.  Reports good urine output.  She does not have any edema.  Denies any orthopnea.  States that she has been coughing up some yellowish phlegm.  Afebrile.  No sick contacts.  Flu, COVID, RSV negative.  Patient takes Trelegy at home and uses DuoNebs throughout the day.  Chest x-ray showed bibasilar atelectasis.  BUN 19 and creatinine 1.19.  BNP 4757.  Patient was given IV Lasix , DuoNeb, Solu-Medrol  in the emergency department at [facility], transferred to [facility].  Patient denies any chest pain.  No fever, no vomiting or diarrhea, no headache or dizziness.    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 She has never used smokeless tobacco. She reports that she does not currently use alcohol . She reports that she does not use drugs.    Allergies   Allergen Reactions    Hydrocodone Nausea/ Vomiting     Medications Prior to Admission       Prescriptions    albuterol  sulfate (PROVENTIL ) 2.5 mg /3 mL (0.083 %) Inhalation nebulizer solution    Take 3 mL (2.5 mg total) by nebulization Once per day as needed for Wheezing    aspirin  81 mg Oral Tablet, Chewable    Chew 1 Tablet (81 mg  total) Daily    atorvastatin  (LIPITOR) 40 mg Oral Tablet    Take 1 Tablet (40 mg total) by mouth Every evening    ergocalciferol, vitamin D2, (DRISDOL) 1,250 mcg (50,000 unit) Oral Capsule    Take 1 Capsule (50,000 Units total) by mouth Every 7 days    escitalopram  oxalate (LEXAPRO ) 20 mg Oral Tablet    Take 1 Tablet (20 mg total) by mouth Daily    fluticasone -umeclidin-vilanter (TRELEGY ELLIPTA) 100-62.5-25 mcg Inhalation Disk with Device    Take 1 Inhalation by inhalation Daily    furosemide  (LASIX ) 40 mg Oral Tablet    Take 1 Tablet (40 mg total) by mouth Daily    ipratropium-albuterol  0.5 mg-3 mg(2.5 mg base)/3 mL Solution for Nebulization    Take 3 mL by nebulization Four times a day    Levocetirizine (XYZAL) 5 mg Oral Tablet    Take 1 Tablet (5 mg total) by mouth Every evening    metoprolol  succinate (TOPROL -XL) 25 mg Oral Tablet Sustained Release 24 hr    Take 1 Tablet (25 mg total) by mouth Daily    omeprazole (PRILOSEC) 20 mg Oral Capsule, Delayed Release(E.C.)    Take 1 Capsule (20 mg total) by mouth Daily    ondansetron  (ZOFRAN  ODT) 4 mg Oral Tablet, Rapid Dissolve     Take 1 Tablet (4 mg total) by mouth Every 8 hours as needed for Nausea/Vomiting    OXYGEN-AIR DELIVERY SYSTEMS DEACT    3L NC at home    polyethylene glycol (MIRALAX ) 17 gram Oral Powder in Packet    Take 2 Packets (34 g total) by mouth Twice daily    Patient not taking:  Reported on 10/26/2023    potassium chloride  (K-DUR) 20 mEq Oral Tab Sust.Rel. Particle/Crystal    Take 1 Tablet (20 mEq total) by mouth Daily    sennosides-docusate sodium  (SENOKOT-S) 8.6-50 mg Oral Tablet    Take 2 Tablets by mouth Twice daily    Patient not taking:  Reported on 10/26/2023          acetaminophen  (TYLENOL ) tablet, 650 mg, Oral, Q4H PRN  aspirin  chewable tablet 81 mg, 81 mg, Oral, Daily  atorvastatin  (LIPITOR) tablet, 40 mg, Oral, QPM  enoxaparin  PF (LOVENOX ) 40 mg/0.4 mL SubQ injection, 40 mg, Subcutaneous, Q24H  escitalopram  (LEXAPRO ) tablet, 20 mg, Oral, Daily  fluticasone -umeclid-vilanterol (TRELEGY ELLIPTA) 100 mcg-62.5 mcg-25 mcg inhaler - "Respiratory to administer", 1 Inhalation, Inhalation, Daily  furosemide  (LASIX ) tablet, 40 mg, Oral, Daily  guaiFENesin  (MUCINEX ) extended release tablet - for cough (expectorant), 600 mg, Oral, 2x/day  ipratropium-albuterol  0.5 mg-3 mg(2.5 mg base)/3 mL Solution for Nebulization, 3 mL, Nebulization, 4x/day  methylPREDNISolone  sod succ (SOLU-medrol ) 125 mg/2 mL injection, 62.5 mg, Intravenous, Q6H  metoprolol  succinate (TOPROL -XL) 24 hr extended release tablet, 25 mg, Oral, Daily  pantoprazole  (PROTONIX ) delayed release tablet, 40 mg, Oral, Daily  polyethylene glycol (MIRALAX ) oral packet, 34 g, Oral, 2x/day  potassium chloride  (K-DUR) extended release tablet, 20 mEq, Oral, Daily        ROS:   Review of Systems   Constitutional: Negative for chills, diaphoresis and fever.   HENT: Negative for congestion, sinus pain, sore throat and tinnitus.    Eyes: Negative for blurred vision and double vision.   Respiratory:  Positive for cough, wheezing, dyspnea  Cardiovascular: Negative for chest pain,  palpitations, orthopnea and leg swelling.   Gastrointestinal: Negative for abdominal pain, blood in stool, diarrhea, melena, nausea and vomiting.   Genitourinary: Negative for dysuria, flank  pain and hematuria.   Musculoskeletal: Negative for joint pain and myalgias.   Skin: Negative for rash.   Neurological: Negative for dizziness, focal weakness and seizures.   Endo/Heme/Allergies: Negative for environmental allergies.   Psychiatric/Behavioral: Negative for depression, substance abuse and suicidal ideas.     All other systems negative unless marked.       Exam:  Vitals:    10/26/23 0815 10/26/23 0830 10/26/23 0944 10/26/23 0956   BP: (!) 153/61 (!) 144/63  (!) 138/48   Pulse: 67 79 64 64   Resp: (!) 22 (!) 22  20   Temp:    36.4 C (97.6 F)   SpO2: 94% 92%  93%   Weight:    74.8 kg (164 lb 14.4 oz)   Height:    1.626 m (5\' 4" )   BMI:    28.31         No intake or output data in the 24 hours ending 10/26/23 1142     Physical Exam   Constitutional: Patient is oriented to person, place, and time and well-developed, well-nourished, and in no distress.   HENT: Mucus membranes moist.  No oral ulcer.  Head: Normocephalic and atraumatic.   Mouth/Throat: Oropharynx is clear and moist.   Eyes: Pupils are equal, round, and reactive to light. Conjunctivae and EOM are normal.   Neck: Neck supple. No JVD present.   Cardiovascular: Normal rate, regular rhythm and normal heart sounds. Exam reveals no gallop and no friction rub.   No murmur heard.  Pulmonary/Chest:  Diffuse wheezing throughout, crackles at the bases, O2 3 L nasal cannula in use  Abdominal: Soft. Bowel sounds are normal. Patient exhibits no distension. There is no abdominal tenderness. There is no rebound and no guarding.   Musculoskeletal: Normal range of motion.      General: No deformity or edema.   Lymphadenopathy: Patient has no cervical adenopathy.   Neurological: Patient is alert and oriented to person, place, and time.   Skin: Skin is warm and dry. No  erythema.   Psychiatric: Affect and judgment normal.     Labs:     Results for orders placed or performed during the hospital encounter of 10/25/23 (from the past 24 hours)   BLOOD GAS W/ LYTES, LACTATE REFLEX Arterial    Collection Time: 10/25/23  7:31 PM   Result Value Ref Range    %FIO2 (ARTERIAL) 32 %    PH (ARTERIAL) 7.46 (H) 7.35 - 7.45    PCO2 (ARTERIAL) 40 35 - 45 mm/Hg    PO2 (ARTERIAL) 70 (L) 83 - 108 mm/Hg    BICARBONATE (ARTERIAL) 28.1 (H) 21.0 - 28.0 mmol/L    BASE EXCESS (ARTERIAL) 4.2 (H) 0.0 - 3.0 mmol/L    PAO2/FIO2 RATIO 219     O2 SATURATION (ARTERIAL) 94.7 94.0 - 98.0 %    SODIUM 136 136 - 145 mmol/L    WHOLE BLOOD POTASSIUM 4.0 3.5 - 5.1 mmol/L    CHLORIDE 104 98 - 107 mmol/L    IONIZED CALCIUM  1.14 (L) 1.15 - 1.33 mmol/L    GLUCOSE 119 65 - 125 mg/dL    LACTATE 1.2 <=9.6 mmol/L    Narrative    LRAD  3 L/M N.C.  ALLEN'S TEST PERFORMED    A reference range for Oxygen Saturation is provided but abnormal results will not flag in Epic.   CBC/DIFF    Collection Time: 10/25/23  7:45 PM    Narrative    The following  orders were created for panel order CBC/DIFF.  Procedure                               Abnormality         Status                     ---------                               -----------         ------                     CBC WITH ZOXW[[id number]]                Abnormal            Final result                 Please view results for these tests on the individual orders.   CBC WITH DIFF    Collection Time: 10/25/23  7:45 PM   Result Value Ref Range    WBC 7.0 3.8 - 11.8 x10^3/uL    RBC 3.66 3.63 - 4.92 x10^6/uL    HGB 10.2 (L) 10.9 - 14.3 g/dL    HCT 11.9 14.7 - 82.9 %    MCV 88.1 75.5 - 95.3 fL    MCH 27.9 24.7 - 32.8 pg    MCHC 31.7 (L) 32.3 - 35.6 g/dL    RDW 56.2 13.0 - 86.5 %    PLATELETS 154 140 - 440 x10^3/uL    MPV 9.2 7.9 - 10.8 fL    NEUTROPHIL % 77 43 - 77 %    LYMPHOCYTE % 13 (L) 16 - 46 %    MONOCYTE % 9 4 - 11 %    EOSINOPHIL % 1 1 - 7 %    BASOPHIL % 0 0 - 1 %    NEUTROPHIL #  5.40 1.90 - 8.20 x10^3/uL    LYMPHOCYTE # 0.90 (L) 1.10 - 3.10 x10^3/uL    MONOCYTE # 0.65 0.20 - 0.90 x10^3/uL    EOSINOPHIL # 0.07 0.00 - 0.50 x10^3/uL    BASOPHIL # 0.02 0.00 - 0.10 x10^3/uL   COMPREHENSIVE METABOLIC PANEL, NON-FASTING    Collection Time: 10/25/23  7:45 PM   Result Value Ref Range    SODIUM 142 136 - 145 mmol/L    POTASSIUM 4.1 3.5 - 5.1 mmol/L    CHLORIDE 104 98 - 107 mmol/L    CO2 TOTAL 29 21 - 32 mmol/L    ANION GAP 9 4 - 13 mmol/L    BUN 19 (H) 7 - 18 mg/dL    CREATININE 7.84 (H) 0.55 - 1.02 mg/dL    BUN/CREA RATIO 16     ESTIMATED GFR 48 (L) >59 mL/min/1.8m^2    ALBUMIN 3.4 3.4 - 5.0 g/dL    CALCIUM  9.1 8.5 - 10.1 mg/dL    GLUCOSE 696 (H) 74 - 106 mg/dL    ALKALINE PHOSPHATASE 87 46 - 116 U/L    ALT (SGPT) 10 <=78 U/L    AST (SGOT) 16 15 - 37 U/L    BILIRUBIN TOTAL 0.8 0.2 - 1.0 mg/dL    PROTEIN TOTAL 6.7 6.4 - 8.2 g/dL    ALBUMIN/GLOBULIN RATIO 1.0 0.8 - 1.4    OSMOLALITY, CALCULATED 287 270 -  290 mOsm/kg    CALCIUM , CORRECTED 9.6 mg/dL    GLOBULIN 3.3     Narrative    Estimated Glomerular Filtration Rate (eGFR) is calculated using the CKD-EPI (2021) equation, intended for patients 70 years of age and older. If gender is not documented or "unknown", there will be no eGFR calculation.   NT-PROBNP    Collection Time: 10/25/23  7:45 PM   Result Value Ref Range    NT-PROBNP 4,757 (H) <=125 pg/mL   SCAN DIFFERENTIAL    Collection Time: 10/25/23  7:45 PM   Result Value Ref Range    RBC MORPHOLOGY COMMENT Normal     PLATELET MORPHOLOGY COMMENT Normal    TROPONIN-I    Collection Time: 10/25/23  7:45 PM   Result Value Ref Range    TROPONIN I 15 (H) <15 ng/L    Narrative    Values received on females ranging between 12-15 ng/L MUST include the next serial troponin to review changes in the delta differences as the reference range for the Access II chemistry analyzer is lower than the established reference range.     TROPONIN-I    Collection Time: 10/26/23 12:03 AM   Result Value Ref Range    TROPONIN  I 12 <15 ng/L    Narrative    Values received on females ranging between 12-15 ng/L MUST include the next serial troponin to review changes in the delta differences as the reference range for the Access II chemistry analyzer is lower than the established reference range.          Imaging Studies:    XR CHEST PA AND LATERAL   Final Result   Bibasilar atelectasis. No pneumonia.         Radiologist location ID: [id number]             Code Status:  Prior    Assessment/Plan:   Active Hospital Problems    Diagnosis    Primary Problem: Acute exacerbation of CHF (congestive heart failure)    Nicotine  dependence    COPD with acute exacerbation (CMS HCC)    Congestive heart failure, unspecified HF chronicity, unspecified heart failure type (CMS HCC)    Dyspnea, unspecified type    Acute on chronic hypoxic respiratory failure (CMS HCC)     # acute exacerbation of chronic obstructive pulmonary disease  # acute on chronic hypoxic respiratory failure  # CHF,unspecified chronicity  -DuoNebs and Solu-Medrol   -resume Trelegy  -titrate O2 p.r.n.  -cardiac echo  -monitor I's and O's  -resume home Lasix     # long-term nicotine  abuse  -smoking cessation counseling provided  -nicotine  patch    # hypertension/hyperlipidemia  -resume home medications as appropriate    # depression  -resume Cymbalta    # AKI  -monitor creatinine  -avoid nephrotoxic medications  -we will resume home Lasix  dose, adjust as needed    DVT/PE Prophylaxis:  Lovenox   Disposition Planning:  Patient is acutely ill requiring inpatient admission for above diagnoses.  Expect greater than 48 hour hospitalization.    Mikeila Burgen, FNP-BC    This note was partially generated using MModal Fluency Direct system, and there may be some incorrect words, spellings, and punctuation that were not noted in checking the note before saving.

## 2023-10-26 NOTE — ED Nurses Note (Signed)
 Report given. All questions answered.

## 2023-10-26 NOTE — Care Plan (Signed)
 Patient very wheezy and short of breath at times. Aggressive pulmonary toileting was done with her today. She has had several PRN breathing treatments too to help facilitate better breathing. Patient verbalizing complaints of her legs cramping so the doctor gave her some magnesium  to see if it helped. External catheter in use per patient request for comfort and accurate intake and output since on lasix .

## 2023-10-26 NOTE — Respiratory Therapy (Signed)
 Spoke to pt about smoking cessation. Pt states she is not interested. Pamphlet given.

## 2023-10-26 NOTE — ED Nurses Note (Signed)
 Nurse is to call me back for report. Patient being transported by [facility] with all personal belongings to [facility] room 209. Alert and oriented. Skin warm and dry. Color WNL. SL intact. Site clear. O2 in use at 3lpm via NC. No SOB or respiratory distress noted at this time. No problems noted upon leaving unit.

## 2023-10-26 NOTE — ED Attending Note (Signed)
 [facility] - Emergency Department  Emergency Department  Course Note    Care/report received from mid-level at 11:30 p.m. Per report:  Shaelyn Joplin is a 73 y.o. female who had concerns including Cough.  Shortness of breath cough chronic obstructive pulmonary disease smoker    Pending labs/imaging/consults:  Patient with chronic obstructive pulmonary disease exacerbation congestive heart failure  Plan:  Patient received IV Lasix  nebulizers patient referred for admission [facility]    Course:  Patient referred for inpatient management [facility]     Patient will be admitted to the PRN HOSPITALIST 1 service for further workup and management.    Disposition: Admitted    Clinical Impression   Acute exacerbation of chronic obstructive pulmonary disease (CMS HCC) (Primary)   Congestive heart failure, unspecified HF chronicity, unspecified heart failure type (CMS HCC)         Ferris Hua, MD

## 2023-10-26 NOTE — ED Nurses Note (Signed)
 Patient laying in bed with eyes closed, rise and fall of chest noted.Patient O2 saturation 95% on 3L NC

## 2023-10-26 NOTE — Care Management Notes (Signed)
 [facility]  Care Management Initial Evaluation    Patient Name: Misty Johnson  Date of Birth: 1951-05-04  Sex: female  Date/Time of Admission: 10/25/2023  6:57 PM  Room/Bed: 209/A  Payor: [employer] MEDICARE / Plan: [employer] MEDICARE / Product Type: MEDICARE [facility] /   Primary Care Providers:  Ailene Housekeeper, MD, MD (General)    Pharmacy Info:   Preferred Pharmacy       [facility] [address] - [place], [place] - [address]    [address] [place] [address]    Phone: [phone] Fax: [phone]    Hours: Not open 24 hours          Emergency Contact Info:   Extended Emergency Contact Information  Primary Emergency Contact: Misty Johnson,Misty Johnson  Mobile Phone: [phone]  Relation: Daughter  Interpreter needed? No    History:   Misty Johnson is a 72 y.o., female, admitted to [facility] on 10/26/23    Height/Weight: 162.6 cm (5\' 4" ) / 74.8 kg (164 lb 14.4 oz)     LOS: 0 days   Admitting Diagnosis: Acute exacerbation of CHF (congestive heart failure) [I50.9]    Assessment:      10/26/23 1338   Assessment Details   Assessment Type Admission   Date of Care Management Update 10/26/23   Readmission   Is this a readmission? No   Insurance Information/Type   Insurance type [employer] an age group to open "lives with" row.  Adult   Lives With child(ren), adult   Living Arrangements house   Able to Return to Prior Arrangements yes   Home Safety   Home Assessment: No Problems Identified   Home Accessibility stairs to enter home;stairs (2 railings present)   Custody and Legal Status   Do you have a court appointed guardian/conservator? No   Are you an emancipated minor? No   Custody Issues? No   Paternity Affidavit Requested? No   Care Management Plan   Discharge Planning Status initial meeting   Discharge plan discussed with: Patient   CM will evaluate for rehabilitation potential yes   Patient choice offered to patient/family Yes   Discharge Needs Assessment   Equipment  Currently Used at Home walker, front wheeled;oxygen   Equipment Needed After Discharge none   Discharge Facility/Level of Care Needs Home vs Home with Home Health   Transportation Available family or friend will provide   Referral Information   Admission Type inpatient   Arrived From home or self-care         Discharge Plan:  Home vs home with Home Health  CM  met with patient for initial assessment of discharge needs. Patient lives with son in a home with all necessary utilities and feels safe returning. Patient denies barriers in the home and patient's daughter assists her in getting to medical appointments. Unsure of DME company and denies [facility] prior to admission. Patient denies discharge needs at this time and her goal is to return home upon discharge.    The patient will continue to be evaluated for developing discharge needs.     Case Manager: Rockney Cid, RN  Phone: [phone]

## 2023-10-27 LAB — MANUAL DIFFERENTIAL
LYMPHOCYTE %: 7 % — ABNORMAL LOW (ref 25–45)
LYMPHOCYTE ABSOLUTE: 0.64 10*3/uL — ABNORMAL LOW (ref 1.10–5.00)
LYMPHOCYTES MANUAL: 7
MONOCYTE %: 1 % (ref 0–12)
MONOCYTE ABSOLUTE: 0.09 10*3/uL (ref 0.00–1.30)
MONOCYTES MANUAL: 1
NEUTROPHIL %: 92 % — ABNORMAL HIGH (ref 40–76)
NEUTROPHIL ABSOLUTE: 8.46 10*3/uL — ABNORMAL HIGH (ref 1.80–8.40)
NEUTROPHILS MANUAL: 92
PLATELET MORPHOLOGY COMMENT: NORMAL
TOTAL CELLS COUNTED [#] IN BLOOD: 100
WBC: 9.2 10*3/uL

## 2023-10-27 LAB — BASIC METABOLIC PANEL
ANION GAP: 7 mmol/L (ref 4–13)
BUN/CREA RATIO: 29 — ABNORMAL HIGH (ref 6–22)
BUN: 23 mg/dL (ref 7–25)
CALCIUM: 9.1 mg/dL (ref 8.6–10.3)
CHLORIDE: 100 mmol/L (ref 98–107)
CO2 TOTAL: 31 mmol/L (ref 21–31)
CREATININE: 0.8 mg/dL (ref 0.60–1.30)
ESTIMATED GFR: 77 mL/min/{1.73_m2} (ref >59)
GLUCOSE: 137 mg/dL — ABNORMAL HIGH (ref 74–109)
OSMOLALITY, CALCULATED: 282 mosm/kg (ref 270–290)
POTASSIUM: 4.7 mmol/L (ref 3.5–5.1)
SODIUM: 138 mmol/L (ref 136–145)

## 2023-10-27 LAB — CBC WITH DIFF
HCT: 34.2 % (ref 31.2–41.9)
HGB: 10.9 g/dL (ref 10.9–14.3)
MCH: 27.4 pg (ref 24.7–32.8)
MCHC: 31.9 g/dL — ABNORMAL LOW (ref 32.3–35.6)
MCV: 85.9 fL (ref 75.5–95.3)
MPV: 9.9 fL (ref 7.9–10.8)
PLATELETS: 171 10*3/uL (ref 140–440)
RBC: 3.98 10*6/uL (ref 3.63–4.92)
RDW: 15.9 % (ref 12.3–17.7)
WBC: 9.2 10*3/uL (ref 3.8–11.8)

## 2023-10-27 LAB — BLOOD GAS W/ LACTATE REFLEX
%FIO2 (ARTERIAL): 32 %
BASE EXCESS (ARTERIAL): 8.5 mmol/L — ABNORMAL HIGH (ref 0.0–3.0)
BICARBONATE (ARTERIAL): 31.5 mmol/L — ABNORMAL HIGH (ref 21.0–28.0)
LACTATE: 0.5 mmol/L (ref <=1.9)
O2 SATURATION (ARTERIAL): 96.4 % (ref 94.0–98.0)
PAO2/FIO2 RATIO: 266
PCO2 (ARTERIAL): 57 mmHg — ABNORMAL HIGH (ref 35–45)
PH (ARTERIAL): 7.4 (ref 7.35–7.45)
PO2 (ARTERIAL): 85 mmHg (ref 83–108)

## 2023-10-27 LAB — POC BLOOD GLUCOSE (RESULTS): GLUCOSE, POC: 108 mg/dL — ABNORMAL HIGH (ref 70–100)

## 2023-10-27 LAB — MAGNESIUM: MAGNESIUM: 2.6 mg/dL (ref 1.9–2.7)

## 2023-10-27 MED ORDER — ZIPRASIDONE 20 MG/ML (FINAL CONCENTRATION) INTRAMUSCULAR SOLUTION
10.0000 mg | INTRAMUSCULAR | Status: DC
Start: 2023-10-27 — End: 2023-10-29
  Filled 2023-10-27: qty 1

## 2023-10-27 MED ORDER — LORAZEPAM 2 MG/ML INJECTION SOLUTION
0.5000 mg | INTRAMUSCULAR | Status: AC
Start: 2023-10-27 — End: 2023-10-27

## 2023-10-27 MED ORDER — LORAZEPAM 2 MG/ML INJECTION SOLUTION
INTRAMUSCULAR | Status: AC
Start: 2023-10-27 — End: 2023-10-27
  Administered 2023-10-27: 0.5 mg via INTRAVENOUS
  Filled 2023-10-27: qty 1

## 2023-10-27 MED ORDER — METHYLPREDNISOLONE SOD SUCC 125 MG SOLUTION FOR INJECTION WRAPPER
62.5000 mg | Freq: Three times a day (TID) | INTRAVENOUS | Status: DC
Start: 2023-10-27 — End: 2023-10-28
  Administered 2023-10-27 – 2023-10-28 (×3): 62.5 mg via INTRAVENOUS
  Filled 2023-10-27 (×3): qty 2

## 2023-10-27 MED ORDER — MAGNESIUM OXIDE 400 MG (241.3 MG MAGNESIUM) TABLET
ORAL_TABLET | ORAL | Status: AC
Start: 2023-10-27 — End: 2023-10-27
  Administered 2023-10-27: 400 mg via ORAL
  Filled 2023-10-27: qty 1

## 2023-10-27 NOTE — Nurses Notes (Signed)
 Patient ambulated 250 feet around unit with walker on 3L nc. O2 remained 92-95 percent. No shortness of breath or weakness reported.

## 2023-10-27 NOTE — Nurses Notes (Signed)
 Patient is anxious, seeing things and people that are not present in the room. She is complaining of anxiety, ripping her leads and oxygen off dropping O2 down to upper 60's-70's. She recovers fine after putting her oxygen back on at 3L. She wears 3L at home 24/7 baseline. Provider contacted. Medications acquired see MAR.

## 2023-10-27 NOTE — Progress Notes (Signed)
 [facility]  Progress Note    Misty Johnson  Date of service: 10/27/2023  Date of Admission:  10/25/2023  Hospital Day:  LOS: 1 day     Subjective:  Patient is seen in follow-up on 10/27/2023:  She is sitting up in the bed.  Currently on 3 L nasal cannula.  She had some confusion throughout the night, much more oriented this morning.  Answering questions appropriately.  Patient states that she just woke up and was confused.  A steroids decreased.  Creatinine at 1.19 today.  Still has some wheezing.  Occasional cough.  Afebrile.    Review of Systems:    All systems were reviewed, pertinent positive and negative as mentioned in subjective.    Objective:      Vital Signs:  Vitals:    10/27/23 0500 10/27/23 0700 10/27/23 1032 10/27/23 1100   BP: (!) 130/52 (!) 123/50  136/88   Pulse: 63 70 57 79   Resp: (!) 21 14  (!) 21   Temp:  36.6 C (97.8 F)  36.2 C (97.1 F)   SpO2: 98% 99%  94%   Weight: 75.3 kg (166 lb)      Height:       BMI:                I/O:  I/O last 24 hours:    Intake/Output Summary (Last 24 hours) at 10/27/2023 1234  Last data filed at 10/27/2023 0600  Gross per 24 hour   Intake --   Output 800 ml   Net -800 ml           Physical Exam:    Constitutional:  Patient is lying in the bed, not in any distress appears comfortable.  HENT: Mucus membranes moist.  No oral ulcer.  Head: Normocephalic and atraumatic.   Mouth/Throat: Oropharynx is clear and moist.   Cardiovascular:  S1-S2 audible,   Pulmonary/Chest:  Bilaterally air entry, expiratory wheezing, scattered rhonchi, occasional cough, O2 3 L nasal cannula  Abdominal: Soft. Bowel sounds are normal. Patient exhibits no distension. There is no abdominal tenderness. There is no rebound and no guarding. .   Neurological:  Patient is awake, alert, oriented X 3, cranial nerves intact, moving all extremities, nonfocal examination  Skin: Skin is warm and dry. No erythema.   Psychiatric: Affect and judgment normal.     acetaminophen  (TYLENOL )  tablet, 650 mg, Oral, Q4H PRN  aspirin  chewable tablet 81 mg, 81 mg, Oral, Daily  atorvastatin  (LIPITOR) tablet, 40 mg, Oral, QPM  budesonide  (PULMICORT  RESPULES) 0.5 mg/2 mL nebulizer suspension, 0.5 mg, Nebulization, Daily  enoxaparin  PF (LOVENOX ) 40 mg/0.4 mL SubQ injection, 40 mg, Subcutaneous, Q24H  escitalopram  (LEXAPRO ) tablet, 20 mg, Oral, Daily  furosemide  (LASIX ) tablet, 40 mg, Oral, Daily  guaiFENesin  (MUCINEX ) extended release tablet - for cough (expectorant), 600 mg, Oral, 2x/day  ipratropium-albuterol  0.5 mg-3 mg(2.5 mg base)/3 mL Solution for Nebulization, 3 mL, Nebulization, 4x/day  magnesium  oxide (MAG-OX) 400mg  (241.3 mg elemental magnesium ) tablet, 400 mg, Oral, 2x/day  methylPREDNISolone  sod succ (SOLU-medrol ) 125 mg/2 mL injection, 62.5 mg, Intravenous, Q8H  metoprolol  succinate (TOPROL -XL) 24 hr extended release tablet, 25 mg, Oral, Daily  nicotine  (NICODERM CQ ) transdermal patch (mg/24 hr), 14 mg, Transdermal, Daily  pantoprazole  (PROTONIX ) delayed release tablet, 40 mg, Oral, Daily  perflutren  lipid microspheres (DEFINITY ) 1.3 mL in NS 10 mL (tot vol) injection, 2 mL, Intravenous, Cardiology Once PRN  polyethylene glycol (MIRALAX ) oral packet, 34 g, Oral, 2x/day  potassium chloride  (K-DUR) extended release tablet, 20 mEq, Oral, Daily  ziprasidone  (GEODON ) IM injection, 10 mg, IntraMUSCULAR, Now      Labs:  Results for orders placed or performed during the hospital encounter of 10/25/23 (from the past 24 hours)   CBC/DIFF *Canceled*    Collection Time: 10/27/23 12:16 AM    Narrative    The following orders were created for panel order CBC/DIFF.  Procedure                               Abnormality         Status                     ---------                               -----------         ------                     CBC WITH ZOXW[[id number]]                                                                 Please view results for these tests on the individual orders.   BLOOD GAS W/ LACTATE REFLEX  Arterial    Collection Time: 10/27/23  5:36 AM   Result Value Ref Range    %FIO2 (ARTERIAL) 32 %    PH (ARTERIAL) 7.40 7.35 - 7.45    PCO2 (ARTERIAL) 57 (H) 35 - 45 mm/Hg    PO2 (ARTERIAL) 85 83 - 108 mm/Hg    BICARBONATE (ARTERIAL) 31.5 (H) 21.0 - 28.0 mmol/L    BASE EXCESS (ARTERIAL) 8.5 (H) 0.0 - 3.0 mmol/L    PAO2/FIO2 RATIO 266     O2 SATURATION (ARTERIAL) 96.4 94.0 - 98.0 %    LACTATE 0.5 <=1.9 mmol/L    Narrative    A reference range for Oxygen Saturation is provided but abnormal results will not flag in Epic.   BASIC METABOLIC PANEL    Collection Time: 10/27/23  9:50 AM   Result Value Ref Range    SODIUM 138 136 - 145 mmol/L    POTASSIUM 4.7 3.5 - 5.1 mmol/L    CHLORIDE 100 98 - 107 mmol/L    CO2 TOTAL 31 21 - 31 mmol/L    ANION GAP 7 4 - 13 mmol/L    CALCIUM  9.1 8.6 - 10.3 mg/dL    GLUCOSE 119 (H) 74 - 109 mg/dL    BUN 23 7 - 25 mg/dL    CREATININE 1.47 8.29 - 1.30 mg/dL    BUN/CREA RATIO 29 (H) 6 - 22    ESTIMATED GFR 77 >59 mL/min/1.53m^2    OSMOLALITY, CALCULATED 282 270 - 290 mOsm/kg    Narrative    Estimated Glomerular Filtration Rate (eGFR) is calculated using the CKD-EPI (2021) equation, intended for patients 65 years of age and older. If gender is not documented or "unknown", there will be no eGFR calculation.     CBC WITH DIFF    Collection Time: 10/27/23  9:50 AM   Result Value Ref Range  WBC 9.2 3.8 - 11.8 x10^3/uL    RBC 3.98 3.63 - 4.92 x10^6/uL    HGB 10.9 10.9 - 14.3 g/dL    HCT 09.3 23.5 - 57.3 %    MCV 85.9 75.5 - 95.3 fL    MCH 27.4 24.7 - 32.8 pg    MCHC 31.9 (L) 32.3 - 35.6 g/dL    RDW 22.0 25.4 - 27.0 %    PLATELETS 171 140 - 440 x10^3/uL    MPV 9.9 7.9 - 10.8 fL   MAGNESIUM     Collection Time: 10/27/23  9:50 AM   Result Value Ref Range    MAGNESIUM  2.6 1.9 - 2.7 mg/dL   POC BLOOD GLUCOSE (RESULTS)    Collection Time: 10/27/23 11:35 AM   Result Value Ref Range    GLUCOSE, POC 108 (H) 70 - 100 mg/dl        Imaging:         Microbiology:  No results found for any visits on 10/25/23  (from the past 96 hours).        Assessment/ Plan:   Active Hospital Problems    Diagnosis    Primary Problem: Acute exacerbation of CHF (congestive heart failure)    Nicotine  dependence    COPD with acute exacerbation (CMS HCC)    Congestive heart failure, unspecified HF chronicity, unspecified heart failure type (CMS HCC)    Dyspnea, unspecified type    Acute on chronic hypoxic respiratory failure (CMS HCC)      # acute exacerbation of chronic obstructive pulmonary disease  # acute on chronic hypoxic respiratory failure  # CHF,unspecified chronicity  -DuoNebs and Solu-Medrol , decreased to 60 mg q.8 hours  -resume Trelegy  -titrate O2 p.r.n., currently on O2 3.5 L  -cardiac echo  -monitor I's and O's  -resume home Lasix      # long-term nicotine  abuse  -smoking cessation counseling provided  -nicotine  patch     # hypertension/hyperlipidemia  -resume home medications as appropriate     # depression  -resume Cymbalta     # AKI  -monitor creatinine, 1.19 today  -avoid nephrotoxic medications  -we will resume home Lasix  dose, adjust as needed  Nutrition:    DIET CARDIAC (2G NA, LOWFAT, LOW CHOL) Do you want to initiate MNT Protocol? Yes    Additional clinical characteristics related to nutrition:    - monitor for weight changes   - monitor intake and output    - monitor bowel functions        DVT prophylaxis.  Lovenox       Disposition Planning:  To be determined, most likely home discharge    Bonny Egger, FNP-BC      This note was partially generated using MModal Fluency Direct system, and there may be some incorrect words, spellings, and punctuation that were not noted in checking the note before saving.

## 2023-10-28 DIAGNOSIS — I422 Other hypertrophic cardiomyopathy: Secondary | ICD-10-CM

## 2023-10-28 LAB — BASIC METABOLIC PANEL
ANION GAP: 4 mmol/L (ref 4–13)
BUN/CREA RATIO: 48 — ABNORMAL HIGH (ref 6–22)
BUN: 32 mg/dL — ABNORMAL HIGH (ref 7–25)
CALCIUM: 9.1 mg/dL (ref 8.6–10.3)
CHLORIDE: 102 mmol/L (ref 98–107)
CO2 TOTAL: 33 mmol/L — ABNORMAL HIGH (ref 21–31)
CREATININE: 0.67 mg/dL (ref 0.60–1.30)
ESTIMATED GFR: 92 mL/min/{1.73_m2} (ref >59)
GLUCOSE: 131 mg/dL — ABNORMAL HIGH (ref 74–109)
OSMOLALITY, CALCULATED: 286 mosm/kg (ref 270–290)
POTASSIUM: 5 mmol/L (ref 3.5–5.1)
SODIUM: 139 mmol/L (ref 136–145)

## 2023-10-28 LAB — CBC
HCT: 32.6 % (ref 31.2–41.9)
HGB: 10.4 g/dL — ABNORMAL LOW (ref 10.9–14.3)
MCH: 27.6 pg (ref 24.7–32.8)
MCHC: 31.8 g/dL — ABNORMAL LOW (ref 32.3–35.6)
MCV: 86.6 fL (ref 75.5–95.3)
MPV: 9.8 fL (ref 7.9–10.8)
PLATELETS: 169 10*3/uL (ref 140–440)
RBC: 3.77 10*6/uL (ref 3.63–4.92)
RDW: 15.9 % (ref 12.3–17.7)
WBC: 11.4 10*3/uL (ref 3.8–11.8)

## 2023-10-28 LAB — MAGNESIUM: MAGNESIUM: 2.6 mg/dL (ref 1.9–2.7)

## 2023-10-28 MED ORDER — SPIRONOLACTONE 25 MG TABLET
25.0000 mg | ORAL_TABLET | Freq: Every morning | ORAL | Status: DC
Start: 2023-10-28 — End: 2023-10-29
  Administered 2023-10-28 – 2023-10-29 (×2): 25 mg via ORAL
  Filled 2023-10-28 (×2): qty 1

## 2023-10-28 MED ORDER — METHYLPREDNISOLONE SOD SUCCINATE 40 MG/ML SOLUTION FOR INJ. WRAPPER
40.0000 mg | Freq: Three times a day (TID) | INTRAMUSCULAR | Status: DC
Start: 2023-10-28 — End: 2023-10-29
  Administered 2023-10-28 – 2023-10-29 (×4): 40 mg via INTRAVENOUS
  Filled 2023-10-28 (×4): qty 1

## 2023-10-28 MED ORDER — SPIRONOLACTONE 25 MG TABLET
25.0000 mg | ORAL_TABLET | Freq: Every morning | ORAL | Status: DC
Start: 2023-10-28 — End: 2023-10-29
  Administered 2023-10-28: 0 mg via ORAL

## 2023-10-28 NOTE — Progress Notes (Signed)
 [facility]  Progress Note    Veleda Harnois  Date of service: 10/28/2023  Date of Admission:  10/25/2023  Hospital Day:  LOS: 2 days     Subjective:  Patient is seen in follow-up on 10/28/23.  Breathing better today.  Currently on 3 L of oxygen.  Wanting to go home.  No chest pain, fever, chills, sweats.  No leg swelling.  Overall doing well, still on Solu-Medrol  62.5 mg IV q.8 hours.  Plan to decrease.    Review of Systems:    All systems were reviewed, pertinent positive and negative as mentioned in subjective.    Objective:      Vital Signs:  Vitals:    10/28/23 0940 10/28/23 1000 10/28/23 1023 10/28/23 1128   BP: (!) 120/44 (!) 118/49     Pulse: 69 62 71    Resp: 15 (!) 21     Temp:    36.2 C (97.1 F)   SpO2: 94% 93%     Weight:       Height:       BMI:                I/O:  I/O last 24 hours:    Intake/Output Summary (Last 24 hours) at 10/28/2023 1241  Last data filed at 10/28/2023 0543  Gross per 24 hour   Intake 1180 ml   Output 1000 ml   Net 180 ml           Physical Exam:    Constitutional:  Patient is lying in the bed, not in any distress appears comfortable.  HENT: Mucus membranes moist.  No oral ulcer.  Head: Normocephalic and atraumatic.   Mouth/Throat: Oropharynx is clear and moist.   Cardiovascular:  S1-S2 audible,   Pulmonary/Chest:  Bilaterally air entry, expiratory wheezing, scattered rhonchi, occasional cough, O2 3 L nasal cannula  Abdominal: Soft. Bowel sounds are normal. Patient exhibits no distension. There is no abdominal tenderness. There is no rebound and no guarding. .   Neurological:  Patient is awake, alert, oriented X 3, cranial nerves intact, moving all extremities, nonfocal examination  Skin: Skin is warm and dry. No erythema.   Psychiatric: Affect and judgment normal.     acetaminophen  (TYLENOL ) tablet, 650 mg, Oral, Q4H PRN  aspirin  chewable tablet 81 mg, 81 mg, Oral, Daily  atorvastatin  (LIPITOR) tablet, 40 mg, Oral, QPM  budesonide  (PULMICORT  RESPULES) 0.5 mg/2 mL  nebulizer suspension, 0.5 mg, Nebulization, Daily  enoxaparin  PF (LOVENOX ) 40 mg/0.4 mL SubQ injection, 40 mg, Subcutaneous, Q24H  escitalopram  (LEXAPRO ) tablet, 20 mg, Oral, Daily  furosemide  (LASIX ) tablet, 40 mg, Oral, Daily  guaiFENesin  (MUCINEX ) extended release tablet - for cough (expectorant), 600 mg, Oral, 2x/day  ipratropium-albuterol  0.5 mg-3 mg(2.5 mg base)/3 mL Solution for Nebulization, 3 mL, Nebulization, 4x/day  methylPREDNISolone  sod succ (SOLU-medrol ) 40 mg/mL injection, 40 mg, Intravenous, Q8H  metoprolol  succinate (TOPROL -XL) 24 hr extended release tablet, 25 mg, Oral, Daily  nicotine  (NICODERM CQ ) transdermal patch (mg/24 hr), 14 mg, Transdermal, Daily  pantoprazole  (PROTONIX ) delayed release tablet, 40 mg, Oral, Daily  perflutren  lipid microspheres (DEFINITY ) 1.3 mL in NS 10 mL (tot vol) injection, 2 mL, Intravenous, Cardiology Once PRN  polyethylene glycol (MIRALAX ) oral packet, 34 g, Oral, 2x/day  spironolactone  (ALDACTONE ) tablet, 25 mg, Oral, Daily with Breakfast  ziprasidone  (GEODON ) IM injection, 10 mg, IntraMUSCULAR, Now      Labs:  Results for orders placed or performed during the hospital encounter of 10/25/23 (from the  past 24 hours)   CBC - AM ONCE    Collection Time: 10/28/23  4:21 AM   Result Value Ref Range    WBC 11.4 3.8 - 11.8 x10^3/uL    RBC 3.77 3.63 - 4.92 x10^6/uL    HGB 10.4 (L) 10.9 - 14.3 g/dL    HCT 09.8 11.9 - 14.7 %    MCV 86.6 75.5 - 95.3 fL    MCH 27.6 24.7 - 32.8 pg    MCHC 31.8 (L) 32.3 - 35.6 g/dL    RDW 82.9 56.2 - 13.0 %    PLATELETS 169 140 - 440 x10^3/uL    MPV 9.8 7.9 - 10.8 fL   BASIC METABOLIC PANEL - AM ONCE    Collection Time: 10/28/23  4:21 AM   Result Value Ref Range    SODIUM 139 136 - 145 mmol/L    POTASSIUM 5.0 3.5 - 5.1 mmol/L    CHLORIDE 102 98 - 107 mmol/L    CO2 TOTAL 33 (H) 21 - 31 mmol/L    ANION GAP 4 4 - 13 mmol/L    CALCIUM  9.1 8.6 - 10.3 mg/dL    GLUCOSE 865 (H) 74 - 109 mg/dL    BUN 32 (H) 7 - 25 mg/dL    CREATININE 7.84 6.96 - 1.30 mg/dL     BUN/CREA RATIO 48 (H) 6 - 22    ESTIMATED GFR 92 >59 mL/min/1.29m^2    OSMOLALITY, CALCULATED 286 270 - 290 mOsm/kg    Narrative    Estimated Glomerular Filtration Rate (eGFR) is calculated using the CKD-EPI (2021) equation, intended for patients 80 years of age and older. If gender is not documented or "unknown", there will be no eGFR calculation.     MAGNESIUM  - AM ONCE    Collection Time: 10/28/23  4:21 AM   Result Value Ref Range    MAGNESIUM  2.6 1.9 - 2.7 mg/dL        Imaging:         Microbiology:  No results found for any visits on 10/25/23 (from the past 96 hours).        Assessment/ Plan:   Active Hospital Problems    Diagnosis    Primary Problem: Acute exacerbation of CHF (congestive heart failure)    Nicotine  dependence    COPD with acute exacerbation (CMS HCC)    Congestive heart failure, unspecified HF chronicity, unspecified heart failure type (CMS HCC)    Dyspnea, unspecified type    Acute on chronic hypoxic respiratory failure (CMS HCC)      # acute exacerbation of chronic obstructive pulmonary disease  # acute on chronic hypoxic respiratory failure  # CHF,unspecified chronicity  -DuoNebs and Solu-Medrol , decreased to 40 mg q.8 hours  -resume Trelegy  -titrate O2 p.r.n., currently on O2 3 L  -cardiac echo concerning for hypertrophic cardiomyopathy.  Seen by Cardiology, recommending hypertension control, Aldactone  25 mg daily, smoking cessation, and outpatient follow up with General Cardiology.  -monitor I's and O's  -home Lasix  resumed     # long-term nicotine  abuse  -smoking cessation counseling provided  -nicotine  patch     # hypertension/hyperlipidemia  -resume home medications as appropriate     # depression  -resume Cymbalta     # AKI  -resolved      Nutrition:    DIET CARDIAC (2G NA, LOWFAT, LOW CHOL) Do you want to initiate MNT Protocol? Yes    Additional clinical characteristics related to nutrition:    - monitor  for weight changes   - monitor intake and output    - monitor bowel  functions        DVT prophylaxis.  Lovenox       Disposition Planning:  To be determined, most likely home discharge

## 2023-10-28 NOTE — Nurses Notes (Signed)
 Report called to [facility] [address] to [employer]. Pt leaving this unit with all belongings. Pt transferred via wheelchair with tech.

## 2023-10-28 NOTE — Nurses Notes (Signed)
 Pt received to room [id number] via wheel chair with all belongings.No home meds or valuables.pt has glasses at bedside.Fall precautions applied for pt safety call bell in reach.

## 2023-10-28 NOTE — Telemedicine Consult (Signed)
 [facility]  Cardiology   Consult      TELEMEDICINE CARDIOLOGY CONSULT NOTE    Telemedicine Documentation:   Modality: Video  Provider Physical Location: [place], [facility]  Patient/family aware of provider location: Yes  Patient/family consent for telemedicine: Yes  Examination observed and performed by: April Christ, FNP-C, Dr. Annis Baseman  Patient Location: [facility]    Name:  Misty Johnson   DOB: July 30, 1950   MRN: [id number]   Date:  10/25/2023     PCP: Ailene Housekeeper, MD   Hospital Day:  LOS: 2 days   Chief Complaint: Cough (Pt c/o cough with SOB for approx one month. Pt states that she just can't catch her breath and is coughing constantly. Pt wears O2 3LPM via NC at all times. O2 at time of triage is 84%)      IMPRESSION/PLAN:  HFpEF  Cardiomyopathy ? HCM  Acute on chronic hypoxic respiratory failure  Tobacco abuse  Htn    Continue with diuresis  Hypertension control   Aldactone  25 mg daily  SGLT2i   Smoking cessation  OP follow up with general cardiology for HCM workup, no indication for inpt MRI      Echo: 10/2023 RV systolic pressure consistent with mild pulmonary hypertension, mildly dilated left atrium, progressive increase of LV wall thickness with speed sign suggestive of hypertrophic cardiomyopathy.  Hyperdynamic LV EF 70%        Thank you for allowing us  to participate in this patient's care. Cardiology will sign off at this time. Please contact the on call cardiologist directly for any further questions or concerns.    HPI:  Misty Johnson is a 73 y.o. female with PMH CAD, HF, COPD, smoking who presented with c/o dyspnea and cough. Her O2 sat was 84% on 3 L on arrival.  BNP 4757 creatinine 1.19, hemoglobin 10.0, troponins 12/15.  Chest x-ray showed bibasilar atelectasis, no pneumonia.     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     Enlarged heart     LVH (left ventricular hypertrophy)     Major depression          Patient Active Problem List    Diagnosis Date Noted    Acute exacerbation of CHF (congestive heart failure) 10/26/2023    Nicotine  dependence 10/26/2023    COPD with acute exacerbation (CMS HCC) 07/01/2022    Congestive heart failure, unspecified HF chronicity, unspecified heart failure type (CMS HCC) 07/01/2022    Dyspnea, unspecified type 07/01/2022    Acute on chronic hypoxic respiratory failure (CMS HCC) 07/01/2022    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      Past Surgical History:   Procedure Laterality Date    NO PAST SURGERIES            acetaminophen  (TYLENOL ) tablet, 650 mg, Oral, Q4H PRN  aspirin  chewable tablet 81 mg, 81 mg, Oral, Daily  atorvastatin  (LIPITOR) tablet, 40 mg, Oral, QPM  budesonide  (PULMICORT  RESPULES) 0.5 mg/2 mL nebulizer suspension, 0.5 mg, Nebulization, Daily  enoxaparin  PF (LOVENOX ) 40 mg/0.4 mL SubQ injection, 40 mg, Subcutaneous, Q24H  escitalopram  (LEXAPRO ) tablet, 20 mg, Oral, Daily  furosemide  (LASIX ) tablet, 40 mg, Oral, Daily  guaiFENesin  (MUCINEX ) extended release tablet - for cough (expectorant), 600 mg, Oral, 2x/day  ipratropium-albuterol  0.5 mg-3 mg(2.5 mg base)/3 mL Solution for Nebulization, 3 mL, Nebulization, 4x/day  methylPREDNISolone  sod succ (SOLU-medrol ) 125 mg/2 mL injection, 62.5 mg, Intravenous, Q8H  metoprolol  succinate (TOPROL -XL) 24 hr extended release tablet, 25 mg, Oral, Daily  nicotine  (NICODERM CQ ) transdermal patch (mg/24 hr), 14 mg, Transdermal, Daily  pantoprazole  (PROTONIX ) delayed release tablet, 40 mg, Oral, Daily  perflutren  lipid microspheres (DEFINITY ) 1.3 mL in NS 10 mL (tot vol) injection, 2 mL, Intravenous, Cardiology Once PRN  polyethylene glycol (MIRALAX ) oral packet, 34 g, Oral,  2x/day  potassium chloride  (K-DUR) extended release tablet, 20 mEq, Oral, Daily  ziprasidone  (GEODON ) IM injection, 10 mg, IntraMUSCULAR, Now         Allergies   Allergen Reactions    Hydrocodone Nausea/ Vomiting     Family Medical History:       Problem Relation (Age of Onset)    No Known Problems Mother, Father            Social History     Tobacco Use    Smoking status: Every Day     Current packs/day: 1.00     Types: Cigarettes    Smokeless tobacco: Never   Substance Use Topics    Alcohol  use: Not Currently       ROS: All other ROS are negative except for what is noted in HPI     I/O last 24 hours:    Intake/Output Summary (Last 24 hours) at 10/28/2023 0756  Last data filed at 10/28/2023 0543  Gross per 24 hour   Intake 1180 ml   Output 1000 ml   Net 180 ml       DIAGNOSTIC STUDIES:    Results for orders placed or performed during the hospital encounter of 10/25/23 (from the past 24 hours)   BASIC METABOLIC PANEL    Collection Time: 10/27/23  9:50 AM   Result Value Ref Range    SODIUM 138 136 - 145 mmol/L    POTASSIUM 4.7 3.5 - 5.1 mmol/L    CHLORIDE 100 98 - 107 mmol/L    CO2 TOTAL 31 21 - 31 mmol/L    ANION GAP 7 4 - 13 mmol/L    CALCIUM  9.1 8.6 - 10.3 mg/dL    GLUCOSE 606 (H) 74 - 109 mg/dL    BUN 23 7 - 25 mg/dL    CREATININE 3.01 6.01 - 1.30 mg/dL    BUN/CREA RATIO 29 (H) 6 - 22    ESTIMATED GFR 77 >59 mL/min/1.57m^2    OSMOLALITY, CALCULATED 282 270 - 290 mOsm/kg    Narrative    Estimated Glomerular Filtration Rate (eGFR) is calculated using the CKD-EPI (2021) equation, intended for patients 26 years of age and older. If gender is not documented or "unknown", there will be no eGFR calculation.     CBC WITH DIFF    Collection Time: 10/27/23  9:50 AM   Result Value Ref Range    WBC 9.2 3.8 - 11.8 x10^3/uL    RBC 3.98 3.63 - 4.92 x10^6/uL    HGB 10.9 10.9 - 14.3 g/dL    HCT 09.3 23.5 - 57.3 %    MCV 85.9 75.5 - 95.3 fL    MCH 27.4 24.7 - 32.8 pg    MCHC 31.9 (L) 32.3 - 35.6 g/dL  RDW 15.9 12.3 - 17.7 %     PLATELETS 171 140 - 440 x10^3/uL    MPV 9.9 7.9 - 10.8 fL   MAGNESIUM     Collection Time: 10/27/23  9:50 AM   Result Value Ref Range    MAGNESIUM  2.6 1.9 - 2.7 mg/dL   MANUAL DIFFERENTIAL    Collection Time: 10/27/23  9:50 AM   Result Value Ref Range    WBC 9.2 x10^3/uL    NEUTROPHIL % 92 (H) 40 - 76 %    LYMPHOCYTE % 7 (L) 25 - 45 %    MONOCYTE % 1 0 - 12 %    EOSINOPHIL %      BASOPHIL %      METAMYELOCYTE %      MYELOCYTE %      PROMYELOCYTE %      BAND %      BLAST %      OTHER %      NEUTROPHIL ABSOLUTE 8.46 (H) 1.80 - 8.40 x10^3/uL    LYMPHOCYTE ABSOLUTE 0.64 (L) 1.10 - 5.00 x10^3/uL    MONOCYTE ABSOLUTE 0.09 0.00 - 1.30 x10^3/uL    EOSINOPHIL ABSOLUTE      BASOPHIL ABSOLUTE      METAMYELOCYTE ABSOLUTE      MYELOCYTE ABSOLUTE      PROMYELOCYTE ABSOLUTE      BLAST ABSOLUTE      OTHER CELL ABSOLUTE      ANISOCYTOSIS 1+ (10-25%)     POLYCHROMASIA      POIKILOCYTOSIS 1+ (10-25%)     BASOPHILIC STIPPLING      MICROCYTOSIS      MACROCYTOSIS      ROULEAUX      SCHISTOCYTES      SPHEROCYTES      TARGET CELLS      TEARDROP CELLS      OVALOCYTE (ELLIPTOCYTE) 1+ (10-25%)     CRENATED RED CELLS      STOMATOCYTES      ACANTHOCYTES (SPUR CELL)      ECHINOCYTE (BURR CELL)      BLISTER CELLS      RBC AGGLUTINATES      HOWELL JOLLY BODIES      ATYPICAL LYMPHOCYTES      TOXIC GRANULATION      DOHLE BODIES      TOXIC VACUOLIZATION      AUER RODS      BASKET CELLS      HYPERSEGMENTATION      LARGE PLATELETS Present     PLATELET CLUMPS      PLATELET MORPHOLOGY COMMENT Normal     BANDS NEUTROPHILS MANUAL      BAND ABSOLUTE      NEUTROPHILS MANUAL 92     LYMPHOCYTES MANUAL 7     MONOCYTES MANUAL 1     EOSINOPHILS MANUAL      BASOPHILS MANUAL      PROMYELOCYTES MANUAL      MYELOCYTES MANUAL      METAMYELOCYTES MANUAL      BLASTS MANUAL      TOTAL CELLS COUNTED [#] IN BLOOD 100     OTHER CELLS MANUAL      NUCLEATED RBC MANUAL      PLASMA CELL %      PLASMA CELL ABSOLUE      PLASMA CELLS MANUAL      HYPOCHROMASIA     POC BLOOD GLUCOSE  (RESULTS)    Collection Time: 10/27/23 11:35 AM  Result Value Ref Range    GLUCOSE, POC 108 (H) 70 - 100 mg/dl   CBC - AM ONCE    Collection Time: 10/28/23  4:21 AM   Result Value Ref Range    WBC 11.4 3.8 - 11.8 x10^3/uL    RBC 3.77 3.63 - 4.92 x10^6/uL    HGB 10.4 (L) 10.9 - 14.3 g/dL    HCT 47.8 29.5 - 62.1 %    MCV 86.6 75.5 - 95.3 fL    MCH 27.6 24.7 - 32.8 pg    MCHC 31.8 (L) 32.3 - 35.6 g/dL    RDW 30.8 65.7 - 84.6 %    PLATELETS 169 140 - 440 x10^3/uL    MPV 9.8 7.9 - 10.8 fL   BASIC METABOLIC PANEL - AM ONCE    Collection Time: 10/28/23  4:21 AM   Result Value Ref Range    SODIUM 139 136 - 145 mmol/L    POTASSIUM 5.0 3.5 - 5.1 mmol/L    CHLORIDE 102 98 - 107 mmol/L    CO2 TOTAL 33 (H) 21 - 31 mmol/L    ANION GAP 4 4 - 13 mmol/L    CALCIUM  9.1 8.6 - 10.3 mg/dL    GLUCOSE 962 (H) 74 - 109 mg/dL    BUN 32 (H) 7 - 25 mg/dL    CREATININE 9.52 8.41 - 1.30 mg/dL    BUN/CREA RATIO 48 (H) 6 - 22    ESTIMATED GFR 92 >59 mL/min/1.44m^2    OSMOLALITY, CALCULATED 286 270 - 290 mOsm/kg    Narrative    Estimated Glomerular Filtration Rate (eGFR) is calculated using the CKD-EPI (2021) equation, intended for patients 39 years of age and older. If gender is not documented or "unknown", there will be no eGFR calculation.     MAGNESIUM  - AM ONCE    Collection Time: 10/28/23  4:21 AM   Result Value Ref Range    MAGNESIUM  2.6 1.9 - 2.7 mg/dL        Cardiopulmonary/Radiology:  XR CHEST PA AND LATERAL  Result Date: 10/25/2023  Impression Bibasilar atelectasis. No pneumonia. Radiologist location ID: [id number]      No results found for this or any previous visit (from the past 2400 hours).     PHYSICAL EXAMINATION:  BP (!) 119/45   Pulse 64   Temp 36.1 C (97 F)   Resp 20   Ht 1.626 m (5\' 4" )   Wt 74.5 kg (164 lb 3.2 oz)   SpO2 97%   BMI 28.18 kg/m     General: No acute distress and appears stated age.    Neck: No JVD, no carotid bruit.    Lungs: Diminished to auscultation bilaterally.    Cardiovascular: Regular rate and  rhythm, normal S1-S2 without murmur, no gallop or rub.    Extremities: No edema.  Skin: Skin warm and dry.    Neurologic: Alert and oriented x3.      I participated and evaluated the patient as part of a collaborative telemedicine service.  See Dr. Lolita Rise addendum for additional information.  My findings from this visit are as stated above.  The co-signing physician did not participate in the management of this patient unless otherwise noted.      April Christ, FNP-C 10/28/2023 07:56    I personally saw and evaluated the patient. See mid-level's note for additional details. My findings/participation are possible HCM. Can follow-up with outpt cards for further workup. HR good. Needs diuresis, MRA and SGLT2i.  Ayari Liwanag, DO

## 2023-10-28 NOTE — Care Plan (Signed)
 Patient continuing to have difficulty breathing. She has been encouraged to use her incentive spirometer often today and cough to clear her lungs. She denies pain. She has had a lot of anxiety about going home and she has been reassured often today that we will get her there as soon as possible.

## 2023-10-28 NOTE — Care Plan (Signed)
 Problem: Adult Inpatient Plan of Care  Goal: Plan of Care Review  Outcome: Ongoing (see interventions/notes)  Goal: Patient-Specific Goal (Individualized)  Outcome: Ongoing (see interventions/notes)  Flowsheets (Taken 10/28/2023 1941)  Individualized Care Needs: monitor respiratory rate and o2 saturation, monitor labs and vitals  Anxieties, Fears or Concerns: verbalized wanting to go homes  Goal: Absence of Hospital-Acquired Illness or Injury  Outcome: Ongoing (see interventions/notes)  Intervention: Identify and Manage Fall Risk  Recent Flowsheet Documentation  Taken 10/28/2023 1941 by Missy Amos, RN  Safety Promotion/Fall Prevention:   fall prevention program maintained   nonskid shoes/slippers when out of bed   safety round/check completed   toileting scheduled  Intervention: Prevent Skin Injury  Recent Flowsheet Documentation  Taken 10/28/2023 1941 by Missy Amos, RN  Body Position: supine  Skin Protection:   adhesive use limited   tubing/devices free from skin contact  Intervention: Prevent and Manage VTE (Venous Thromboembolism) Risk  Recent Flowsheet Documentation  Taken 10/28/2023 1941 by Missy Amos, RN  VTE Prevention/Management:   dorsiflexion/plantar flexion performed   anticoagulant therapy maintained  Intervention: Prevent Infection  Recent Flowsheet Documentation  Taken 10/28/2023 1941 by Missy Amos, RN  Infection Prevention:   personal protective equipment utilized   promote handwashing   rest/sleep promoted  Goal: Optimal Comfort and Wellbeing  Outcome: Ongoing (see interventions/notes)  Intervention: Provide Person-Centered Care  Recent Flowsheet Documentation  Taken 10/28/2023 1941 by Missy Amos, RN  Trust Relationship/Rapport:   care explained   questions answered   questions encouraged  Goal: Rounds/Family Conference  Outcome: Ongoing (see interventions/notes)     Problem: Heart Failure  Goal: Optimal Coping  Outcome: Ongoing (see interventions/notes)  Intervention: Support Psychosocial Response  Recent  Flowsheet Documentation  Taken 10/28/2023 1941 by Missy Amos, RN  Supportive Measures: active listening utilized  Goal: Optimal Cardiac Output and Blood Flow  Outcome: Ongoing (see interventions/notes)  Intervention: Optimize Cardiac Output  Recent Flowsheet Documentation  Taken 10/28/2023 1941 by Missy Amos, RN  Environmental Support: calm environment promoted  Goal: Stable Heart Rate and Rhythm  Outcome: Ongoing (see interventions/notes)  Goal: Fluid and Electrolyte Balance  Outcome: Ongoing (see interventions/notes)  Goal: Optimal Functional Ability  Outcome: Ongoing (see interventions/notes)  Intervention: Optimize Functional Ability  Recent Flowsheet Documentation  Taken 10/28/2023 1941 by Missy Amos, RN  Self-Care Promotion: independence encouraged  Activity Management: bedrest  Goal: Improved Oral Intake  Outcome: Ongoing (see interventions/notes)  Goal: Effective Oxygenation and Ventilation  Outcome: Ongoing (see interventions/notes)  Intervention: Promote Airway Secretion Clearance  Recent Flowsheet Documentation  Taken 10/28/2023 1941 by Missy Amos, RN  Breathing Techniques/Airway Clearance: deep/controlled cough encouraged  Cough And Deep Breathing: done independently per patient  Activity Management: bedrest  Intervention: Optimize Oxygenation and Ventilation  Recent Flowsheet Documentation  Taken 10/28/2023 1941 by Missy Amos, RN  Airway/Ventilation Management: airway patency maintained  Head of Bed (HOB) Positioning: HOB elevated  Goal: Effective Breathing Pattern During Sleep  Outcome: Ongoing (see interventions/notes)  Intervention: Monitor and Manage Obstructive Sleep Apnea  Recent Flowsheet Documentation  Taken 10/28/2023 1941 by Missy Amos, RN  Medication Review/Management: medications reviewed     Problem: Fall Injury Risk  Goal: Absence of Fall and Fall-Related Injury  Outcome: Ongoing (see interventions/notes)  Intervention: Identify and Manage Contributors  Recent Flowsheet Documentation  Taken 10/28/2023 1941  by Missy Amos, RN  Self-Care Promotion: independence encouraged  Medication Review/Management: medications reviewed  Intervention: Promote Injury-Free Environment  Recent Flowsheet Documentation  Taken 10/28/2023 1941 by Missy Amos, RN  Safety Promotion/Fall Prevention:   fall prevention program maintained   nonskid shoes/slippers when out of bed   safety round/check completed   toileting scheduled     Problem: Skin Injury Risk Increased  Goal: Skin Health and Integrity  Outcome: Ongoing (see interventions/notes)  Intervention: Optimize Skin Protection  Recent Flowsheet Documentation  Taken 10/28/2023 1941 by Missy Amos, RN  Pressure Reduction Techniques:   Patient turned q 2 hours   Moisture, shear and nutrition are maximized  Pressure Reduction Devices:   Pressure redistributing mattress utilized   Repositioning wedges/pillows utilized  Skin Protection:   adhesive use limited   tubing/devices free from skin contact  Activity Management: bedrest  Head of Bed (HOB) Positioning: HOB elevated

## 2023-10-29 DIAGNOSIS — I422 Other hypertrophic cardiomyopathy: Secondary | ICD-10-CM

## 2023-10-29 LAB — COMPREHENSIVE METABOLIC PANEL, NON-FASTING
ALBUMIN/GLOBULIN RATIO: 1.6 — ABNORMAL HIGH (ref 0.8–1.4)
ALBUMIN: 3.8 g/dL (ref 3.5–5.7)
ALKALINE PHOSPHATASE: 56 U/L (ref 34–104)
ALT (SGPT): 8 U/L (ref 7–52)
ANION GAP: 6 mmol/L (ref 4–13)
AST (SGOT): 11 U/L — ABNORMAL LOW (ref 13–39)
BILIRUBIN TOTAL: 0.7 mg/dL (ref 0.3–1.0)
BUN/CREA RATIO: 41 — ABNORMAL HIGH (ref 6–22)
BUN: 35 mg/dL — ABNORMAL HIGH (ref 7–25)
CALCIUM, CORRECTED: 9.3 mg/dL (ref 8.9–10.8)
CALCIUM: 9.1 mg/dL (ref 8.6–10.3)
CHLORIDE: 99 mmol/L (ref 98–107)
CO2 TOTAL: 34 mmol/L — ABNORMAL HIGH (ref 21–31)
CREATININE: 0.85 mg/dL (ref 0.60–1.30)
ESTIMATED GFR: 72 mL/min/{1.73_m2} (ref >59)
GLOBULIN: 2.4 (ref 2.0–3.5)
GLUCOSE: 211 mg/dL — ABNORMAL HIGH (ref 74–109)
OSMOLALITY, CALCULATED: 292 mosm/kg — ABNORMAL HIGH (ref 270–290)
POTASSIUM: 4.4 mmol/L (ref 3.5–5.1)
PROTEIN TOTAL: 6.2 g/dL — ABNORMAL LOW (ref 6.4–8.9)
SODIUM: 139 mmol/L (ref 136–145)

## 2023-10-29 LAB — CBC WITH DIFF
BASOPHIL #: 0 10*3/uL (ref 0.00–0.10)
BASOPHIL %: 0 % (ref 0–1)
EOSINOPHIL #: 0 10*3/uL (ref 0.00–0.50)
EOSINOPHIL %: 0 % — ABNORMAL LOW (ref 1–7)
HCT: 33.8 % (ref 31.2–41.9)
HGB: 10.9 g/dL (ref 10.9–14.3)
LYMPHOCYTE #: 0.3 10*3/uL — ABNORMAL LOW (ref 1.10–3.10)
LYMPHOCYTE %: 3 % — ABNORMAL LOW (ref 16–46)
MCH: 27.5 pg (ref 24.7–32.8)
MCHC: 32.2 g/dL — ABNORMAL LOW (ref 32.3–35.6)
MCV: 85.6 fL (ref 75.5–95.3)
MONOCYTE #: 0.2 10*3/uL (ref 0.20–0.90)
MONOCYTE %: 2 % — ABNORMAL LOW (ref 4–11)
MPV: 9.7 fL (ref 7.9–10.8)
NEUTROPHIL #: 8.2 10*3/uL (ref 1.90–8.20)
NEUTROPHIL %: 95 % — ABNORMAL HIGH (ref 43–77)
PLATELETS: 169 10*3/uL (ref 140–440)
RBC: 3.94 10*6/uL (ref 3.63–4.92)
RDW: 15.8 % (ref 12.3–17.7)
WBC: 8.6 10*3/uL (ref 3.8–11.8)

## 2023-10-29 LAB — SCAN DIFFERENTIAL: PLATELET MORPHOLOGY COMMENT: NORMAL

## 2023-10-29 LAB — HGA1C (HEMOGLOBIN A1C WITH EST AVG GLUCOSE): HEMOGLOBIN A1C: 5.5 % (ref 4.0–6.0)

## 2023-10-29 LAB — MAGNESIUM: MAGNESIUM: 2.5 mg/dL (ref 1.9–2.7)

## 2023-10-29 MED ORDER — GUAIFENESIN ER 600 MG TABLET, EXTENDED RELEASE 12 HR
600.0000 mg | EXTENDED_RELEASE_TABLET | Freq: Two times a day (BID) | ORAL | 0 refills | Status: AC
Start: 2023-10-29 — End: 2023-11-03

## 2023-10-29 MED ORDER — NICOTINE 14 MG/24 HR DAILY TRANSDERMAL PATCH
14.0000 mg | MEDICATED_PATCH | Freq: Every day | TRANSDERMAL | 0 refills | Status: AC
Start: 2023-10-30 — End: 2023-11-29

## 2023-10-29 MED ORDER — PREDNISONE 10 MG TABLET
40.0000 mg | ORAL_TABLET | Freq: Every day | ORAL | 0 refills | Status: AC
Start: 2023-10-29 — End: 2023-11-03

## 2023-10-29 NOTE — Discharge Summary (Signed)
 [place] MEDICINE [facility]     DISCHARGE SUMMARY      PATIENT NAME:  Misty Johnson   MRN:  [id number]  DOB:  May 30, 1951    INPATIENT ADMISSION DATE: 10/25/2023   DATE OF DISCHARGE:  10/29/23     ATTENDING PHYSICIAN: Lennette Quiver, MD     PRIMARY CARE PHYSICIAN: Ailene Housekeeper, MD     HOSPITAL PRESENTATION:    Please see discharge summery from 10/28/22  FURTHER HOSPITAL COURSE WITH DISCHARGE DIAGNOSES:      Problem List:  Active Hospital Problems   (*Primary Problem)    Diagnosis    *Acute exacerbation of CHF (congestive heart failure)    Hypertrophic cardiomyopathy (CMS HCC)    Nicotine  dependence     Chronic    COPD with acute exacerbation (CMS HCC)    Congestive heart failure, unspecified HF chronicity, unspecified heart failure type (CMS HCC)    Dyspnea, unspecified type    Acute on chronic hypoxic respiratory failure (CMS HCC)       Nutrition:    DIET CARDIAC (2G NA, LOWFAT, LOW CHOL) Do you want to initiate MNT Protocol? Yes    Additional clinical characteristics related to nutrition:    - monitor for weight changes   - monitor intake and output    - monitor bowel functions                    PHYSICAL EXAM   DAY OF DISCHARGE:  BP (!) 132/42   Pulse 63   Temp 37 C (98.6 F)   Resp 18   Ht 1.626 m (5\' 4" )   Wt 74.5 kg (164 lb 4.8 oz)   SpO2 98%   BMI 28.20 kg/m        General:  Patient is resting in bed, no acute distress, alert and oriented x3   Eyes:  PERRL, no scleral icterus   HENT:  Normocephalic, atraumatic, oral mucosa is moist and pink, no nasal discharge   Heart:  RRR, S1 and S2 auscultated, no murmurs appreciated   Lungs:  Unlabored respirations.  Lungs are clear to auscultation bilaterally, no wheezes, no rales  Abdomen:  Soft, active bowel sounds, non-tender to palpation, non-distended  Extremities:  Pulses equal in all extremities bilaterally.  Capillary refill less than 3 seconds.  No edema in lower extremities bilaterally   Skin:  Warm and dry.  Not diaphoretic  Neuro:  A&O  x 3.  No focal deficits.  Speech intact.  Not tremulous  Psych:  Cooperative, not agitated    LABS:  CBC with Diff (Last 48 Hours):    Recent Results in last 48 hours     10/28/23  0421 10/29/23  0834   WBC 11.4 8.6   HGB 10.4* 10.9   HCT 32.6 33.8   MCV 86.6 85.6   PLTCNT 169 169   PMNS  --  95*   LYMPHOCYTES  --  3*   MONOCYTES  --  2*   EOSINOPHIL  --  0*   BASOPHILS  --  0  0.00          Last BMP  (Last result in the past 48 hours)        Na   K   Cl   CO2   BUN   Cr   Calcium    Glucose   Glucose-Fasting        10/29/23 0834 139   4.4   99   34  35   0.85   9.1   211               Last Hepatic Panel  (Last result in the past 48 hours)        Albumin   Total PTN   Total Bili   Direct Bili   Ast/SGOT   Alt/SGPT   Alk Phos        10/29/23 0834 3.8   6.2   0.7     11   8    56                BMP (Last 48 Hours):    Recent Results in last 48 hours     10/28/23  0421 10/29/23  0834   SODIUM 139 139   POTASSIUM 5.0 4.4   CHLORIDE 102 99   CO2 33* 34*   BUN 32* 35*   CREATININE 0.67 0.85   CALCIUM  9.1 9.1   GLUCOSENF 131* 211*          IMAGING:  Results for orders placed or performed during the hospital encounter of 10/25/23   XR CHEST PA AND LATERAL     Status: None    Narrative    [name]    RADIOLOGIST: Xenia Heinz    XR CHEST PA AND LATERAL performed on 10/25/2023 7:39 PM    CLINICAL HISTORY: Dyspnea  COUGH, SOB. HX OF COPD    TECHNIQUE: Frontal and lateral views of the chest.    COMPARISON:  Chest radiograph dated 03/22/2023    FINDINGS:    The heart size is normal.  The mediastinal contour is unremarkable.  Bandlike opacities in both lower lungs are minimal.   Mid thoracic spine vertebral compression deformity is chronic.        Impression    Bibasilar atelectasis. No pneumonia.      Radiologist location ID: [id number]          DISCHARGE MEDICATIONS:     Current Discharge Medication List        START taking these medications.        Details   guaiFENesin  600 mg Tablet Extended Release 12hr  Commonly  known as: MUCINEX    600 mg, Oral, 2 TIMES DAILY  Qty: 10 Tablet  Refills: 0     nicotine  14 mg/24 hr Patch 24 hr  Commonly known as: NICODERM CQ   Start taking on: October 30, 2023   14 mg, Transdermal, Daily  Qty: 30 Patch  Refills: 0     polyethylene glycol 17 gram Powder in Packet  Commonly known as: MIRALAX    34 g, Oral, 2 TIMES DAILY  Refills: 0     predniSONE  10 mg Tablet  Commonly known as: DELTASONE    40 mg, Oral, Daily  Qty: 20 Tablet  Refills: 0     sennosides-docusate sodium  8.6-50 mg Tablet  Commonly known as: SENOKOT-S   2 Tablets, Oral, 2 TIMES DAILY  Refills: 0            CONTINUE these medications - NO CHANGES were made during your visit.        Details   albuterol  sulfate 2.5 mg /3 mL (0.083 %) nebulizer solution  Commonly known as: PROVENTIL    2.5 mg, DAILY PRN  Refills: 0     aspirin  81 mg Tablet, Chewable   81 mg, Daily  Refills: 0     atorvastatin  40 mg Tablet  Commonly known as: LIPITOR   40 mg,  EVERY EVENING  Refills: 0     ergocalciferol (vitamin D2) 1,250 mcg (50,000 unit) Capsule  Commonly known as: DRISDOL   50,000 Units, EVERY 7 DAYS  Refills: 0     escitalopram  oxalate 20 mg Tablet  Commonly known as: LEXAPRO    20 mg, Daily  Refills: 0     furosemide  40 mg Tablet  Commonly known as: LASIX    40 mg, Daily  Refills: 0     ipratropium-albuteroL  0.5 mg-3 mg(2.5 mg base)/3 mL nebulizer solution  Commonly known as: DUONEB   3 mL, Nebulization, 4 TIMES DAILY  Refills: 0     Levocetirizine 5 mg Tablet  Commonly known as: XYZAL   5 mg, EVERY EVENING  Refills: 0     metoprolol  succinate 25 mg Tablet Sustained Release 24 hr  Commonly known as: TOPROL -XL   25 mg, Daily  Refills: 0     omeprazole 20 mg Capsule, Delayed Release(E.C.)  Commonly known as: PRILOSEC   20 mg, Daily  Refills: 0     ondansetron  4 mg Tablet, Rapid Dissolve  Commonly known as: ZOFRAN  ODT   4 mg, Oral, EVERY 8 HOURS PRN  Qty: 12 Tablet  Refills: 0     OXYGEN-AIR DELIVERY SYSTEMS DEACT   3L NC at home  Refills: 0     potassium  chloride 20 mEq Tab Sust.Rel. Particle/Crystal  Commonly known as: K-DUR   20 mEq, Daily  Refills: 0     Trelegy Ellipta 100-62.5-25 mcg Disk with Device  Generic drug: fluticasone -umeclidin-vilanter   1 Inhalation, Daily  Refills: 0                 DISCHARGE DISPOSITION:  [facility].    DISCHARGE INSTRUCTIONS:    Follow-up Information       Ailene Housekeeper, MD Follow up in 1 week(s).    Specialty: FAMILY MEDICINE  Contact information:  [address] [facility] [address]  [place] [address]  [phone]                           No discharge procedures on file.   Follow-up Information       Ailene Housekeeper, MD Follow up in 1 week(s).    Specialty: FAMILY MEDICINE  Contact information:  [address]  [place] [address]  [phone]                                     Copies sent to Care Team         Relationship Specialty Notifications Start End    Ailene Housekeeper, MD PCP - General FAMILY MEDICINE  04/13/22     Phone: [phone] Fax: [phone]         [address] [place] [address]            >30 minutes total were spent coordinating discharge day today      Lennette Quiver, MD  [facility] MEDICINE HOSPITALIST

## 2023-11-04 ENCOUNTER — Emergency Department
Admission: EM | Admit: 2023-11-04 | Discharge: 2023-11-05 | Disposition: A | Discharge status: Home / Self Care | Payer: Medicare (Managed Care) | Source: Home / Self Care | Attending: Family Medicine | Admitting: Family Medicine

## 2023-11-04 ENCOUNTER — Encounter (HOSPITAL_COMMUNITY): Payer: Self-pay | Admitting: Family Medicine

## 2023-11-04 ENCOUNTER — Emergency Department (HOSPITAL_COMMUNITY): Payer: Medicare (Managed Care)

## 2023-11-04 ENCOUNTER — Other Ambulatory Visit: Payer: Self-pay

## 2023-11-04 DIAGNOSIS — J439 Emphysema, unspecified: Secondary | ICD-10-CM | POA: Insufficient documentation

## 2023-11-04 DIAGNOSIS — I509 Heart failure, unspecified: Secondary | ICD-10-CM | POA: Insufficient documentation

## 2023-11-04 DIAGNOSIS — I517 Cardiomegaly: Secondary | ICD-10-CM | POA: Insufficient documentation

## 2023-11-04 DIAGNOSIS — Z9981 Dependence on supplemental oxygen: Secondary | ICD-10-CM | POA: Insufficient documentation

## 2023-11-04 DIAGNOSIS — R9431 Abnormal electrocardiogram [ECG] [EKG]: Secondary | ICD-10-CM | POA: Insufficient documentation

## 2023-11-04 DIAGNOSIS — M79671 Pain in right foot: Secondary | ICD-10-CM | POA: Insufficient documentation

## 2023-11-04 DIAGNOSIS — J449 Chronic obstructive pulmonary disease, unspecified: Secondary | ICD-10-CM

## 2023-11-04 DIAGNOSIS — M79672 Pain in left foot: Secondary | ICD-10-CM | POA: Insufficient documentation

## 2023-11-04 DIAGNOSIS — I2510 Atherosclerotic heart disease of native coronary artery without angina pectoris: Secondary | ICD-10-CM | POA: Insufficient documentation

## 2023-11-04 DIAGNOSIS — R197 Diarrhea, unspecified: Secondary | ICD-10-CM | POA: Insufficient documentation

## 2023-11-04 LAB — TROPONIN-I
TROPONIN I: 17 ng/L — ABNORMAL HIGH (ref <15)
TROPONIN I: 18 ng/L — ABNORMAL HIGH (ref <15)

## 2023-11-04 LAB — COMPREHENSIVE METABOLIC PANEL, NON-FASTING
ALBUMIN/GLOBULIN RATIO: 1.8 — ABNORMAL HIGH (ref 0.8–1.4)
ALBUMIN: 3.9 g/dL (ref 3.5–5.7)
ALKALINE PHOSPHATASE: 56 U/L (ref 34–104)
ALT (SGPT): 14 U/L (ref 7–52)
ANION GAP: 9 mmol/L (ref 4–13)
AST (SGOT): 14 U/L (ref 13–39)
BILIRUBIN TOTAL: 0.8 mg/dL (ref 0.3–1.0)
BUN/CREA RATIO: 26 — ABNORMAL HIGH (ref 6–22)
BUN: 26 mg/dL — ABNORMAL HIGH (ref 7–25)
CALCIUM, CORRECTED: 9 mg/dL (ref 8.9–10.8)
CALCIUM: 8.9 mg/dL (ref 8.6–10.3)
CHLORIDE: 101 mmol/L (ref 98–107)
CO2 TOTAL: 29 mmol/L (ref 21–31)
CREATININE: 1.01 mg/dL (ref 0.60–1.30)
ESTIMATED GFR: 58 mL/min/{1.73_m2} — ABNORMAL LOW (ref >59)
GLOBULIN: 2.2 (ref 2.0–3.5)
GLUCOSE: 108 mg/dL (ref 74–109)
OSMOLALITY, CALCULATED: 283 mosm/kg (ref 270–290)
POTASSIUM: 3.4 mmol/L — ABNORMAL LOW (ref 3.5–5.1)
PROTEIN TOTAL: 6.1 g/dL — ABNORMAL LOW (ref 6.4–8.9)
SODIUM: 139 mmol/L (ref 136–145)

## 2023-11-04 LAB — CBC WITH DIFF
BASOPHIL #: 0 10*3/uL (ref 0.00–0.10)
BASOPHIL %: 0 % (ref 0–1)
EOSINOPHIL #: 0.1 10*3/uL (ref 0.00–0.50)
EOSINOPHIL %: 1 % (ref 1–7)
HCT: 35.1 % (ref 31.2–41.9)
HGB: 11.3 g/dL (ref 10.9–14.3)
LYMPHOCYTE #: 2 10*3/uL (ref 1.10–3.10)
LYMPHOCYTE %: 17 % (ref 16–46)
MCH: 27.2 pg (ref 24.7–32.8)
MCHC: 32.1 g/dL — ABNORMAL LOW (ref 32.3–35.6)
MCV: 84.7 fL (ref 75.5–95.3)
MONOCYTE #: 0.9 10*3/uL (ref 0.20–0.90)
MONOCYTE %: 8 % (ref 4–11)
MPV: 8.9 fL (ref 7.9–10.8)
NEUTROPHIL #: 8.9 10*3/uL — ABNORMAL HIGH (ref 1.90–8.20)
NEUTROPHIL %: 75 % (ref 43–77)
PLATELETS: 217 10*3/uL (ref 140–440)
RBC: 4.15 10*6/uL (ref 3.63–4.92)
RDW: 16.4 % (ref 12.3–17.7)
WBC: 11.8 10*3/uL (ref 3.8–11.8)

## 2023-11-04 LAB — PTT (PARTIAL THROMBOPLASTIN TIME): APTT: 25.7 s (ref 25.0–38.0)

## 2023-11-04 LAB — PT/INR
INR: 0.97 (ref 0.84–1.10)
PROTHROMBIN TIME: 11 s (ref 9.8–12.7)

## 2023-11-04 LAB — B-TYPE NATRIURETIC PEPTIDE (BNP),PLASMA: BNP: 580 pg/mL — ABNORMAL HIGH (ref 1–100)

## 2023-11-04 LAB — MAGNESIUM: MAGNESIUM: 2.2 mg/dL (ref 1.9–2.7)

## 2023-11-04 MED ORDER — FUROSEMIDE 10 MG/ML INJECTION SOLUTION
INTRAMUSCULAR | Status: AC
Start: 2023-11-04 — End: 2023-11-04
  Filled 2023-11-04: qty 4

## 2023-11-04 MED ORDER — METHYLPREDNISOLONE SOD SUCC 125 MG SOLUTION FOR INJECTION WRAPPER
INTRAVENOUS | Status: AC
Start: 2023-11-04 — End: 2023-11-04
  Filled 2023-11-04: qty 2

## 2023-11-04 MED ORDER — IPRATROPIUM 0.5 MG-ALBUTEROL 3 MG (2.5 MG BASE)/3 ML NEBULIZATION SOLN
3.0000 mL | INHALATION_SOLUTION | RESPIRATORY_TRACT | Status: AC
Start: 2023-11-04 — End: 2023-11-04
  Administered 2023-11-04: 3 mL via RESPIRATORY_TRACT

## 2023-11-04 MED ORDER — FUROSEMIDE 10 MG/ML INJECTION SOLUTION
20.0000 mg | INTRAMUSCULAR | Status: AC
Start: 2023-11-04 — End: 2023-11-04
  Administered 2023-11-04: 20 mg via INTRAVENOUS

## 2023-11-04 MED ORDER — BUDESONIDE 0.5 MG/2 ML SUSPENSION FOR NEBULIZATION
1.0000 mg | INHALATION_SUSPENSION | RESPIRATORY_TRACT | Status: AC
Start: 2023-11-04 — End: 2023-11-04
  Administered 2023-11-04: 1 mg via RESPIRATORY_TRACT

## 2023-11-04 MED ORDER — METHYLPREDNISOLONE SOD SUCC 125 MG SOLUTION FOR INJECTION WRAPPER
62.5000 mg | INTRAVENOUS | Status: AC
Start: 2023-11-04 — End: 2023-11-04
  Administered 2023-11-04: 62.5 mg via INTRAVENOUS

## 2023-11-04 NOTE — ED Triage Notes (Signed)
 Pt. Reports worsening bilateral feet swelling since recent discharge from hospital. Also reports constant diarrhea post hospital discharge as well.

## 2023-11-04 NOTE — ED Provider Notes (Signed)
 [facility]  ED Primary Provider Note  Patient Name: Misty Johnson  Patient Age: 73 y.o.  Date of Birth: 10-20-1950    Chief Complaint: Leg Swelling        History of Present Illness       Misty Johnson is a 73 y.o. female who had concerns including Leg Swelling.  PATIENT PRESENTED TO THE EMERGENCY DEPARTMENT WITH COMPLAINTS OF PAIN IN HER FEET WITH CONCERN FOR SWELLING.  PATIENT STATES THAT SHE HAS BEEN EXPERIENCING SIMILAR SYMPTOMS SINCE SHE WAS DISCHARGED FROM THIS FACILITY APPROXIMATELY 1 WEEK AGO.  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  SHE IS WEAR OXYGEN AT HOME AND DOES ADMIT TO SOME INCREASED DIFFICULTY BREATHING OVER THE PAST COUPLE OF DAYS.  HOWEVER, SHE DENIES ANY FEVER, CHEST PAIN, PALPITATIONS OR RECENT CHANGES IN BLADDER HABITS.  PATIENT DOES ADMIT TO SOME INTERMITTENT DIARRHEA SINCE BEING DISCHARGED, BUT DIARRHEA HAS NOT BEEN SIGNIFICANT AND THERE WAS NO MAJOR CONCERN FOR C DIFF AT THIS TIME.  PATIENT STATES THAT HER FEET JUST STARTED HURTING TODAY AND SHE THOUGHT THAT THEY WERE SWOLLEN AND PRESENTED TO THE EMERGENCY DEPARTMENT FOR FURTHER EVALUATION.  SHE DENIES ANY FURTHER COMPLAINTS OR CONCERNS.  FAMILY MEMBER DENIES ANY FURTHER CONCERNS.        Review of Systems     No other overt Review of Systems are noted to be positive except noted in the HPI.      Historical Data   History Reviewed This Encounter: Medical History  Surgical History  Family History  Social History      Physical Exam   ED Triage Vitals   BP (Non-Invasive) 11/04/23 2043 109/62   Heart Rate 11/04/23 2043 58   Respiratory Rate 11/04/23 2043 (!) 22   Temperature 11/04/23 2043 36.6 C (97.8 F)   SpO2 11/04/23 2043 93 %   Weight 11/04/23 2212 74.5 kg (164 lb 4.8 oz)   Height 11/04/23 2212 1.626 m (5\' 4" )         Nursing notes reviewed for what could be assessed. Past Medical, Surgical, and Social history reviewed for what has been completed.      Constitutional: NAD.  AFEBRILE.  Head: Normocephalic, atraumatic.  Mouth/Throat: no nasal discharge, posterior pharynx WNL  Eyes: EOM grossly intact, conjunctiva normal.  Cardiovascular: Regular Rate and Rhythm, extremities well perfused.  Pulmonary/Chest: No respiratory distress.  WHEEZING SCATTERED THROUGHOUT LUNG FIELDS WITHOUT SIGNIFICANT RALES OR RHONCHI APPRECIATED ON EXAMINATION.  Abdominal: Soft, non-tender, non-distended. Non peritoneal, no rebound, no guarding.  MSK: No Lower Extremity Edema.  THERE WAS NO SWELLING WHATSOEVER OF THE LOWER EXTREMITIES OR THE FEET.  PULSES INTACT DISTALLY.  CAPILLARY REFILL WITHIN NORMAL LIMITS AND THERE WERE NO DECREASED MOTOR OR SENSORY DEFICITS NOTED ON EXAMINATION.  Skin: Warm, dry, and intact  Neuro: Appropriate, CN II-XII grossly intact   Psych: Cooperative           Procedures      Patient Data     Labs Ordered/Reviewed   COMPREHENSIVE METABOLIC PANEL, NON-FASTING - Abnormal; Notable for the following components:       Result Value    POTASSIUM 3.4 (*)     BUN 26 (*)     BUN/CREA RATIO 26 (*)     ESTIMATED GFR 58 (*)     PROTEIN TOTAL 6.1 (*)     ALBUMIN/GLOBULIN RATIO 1.8 (*)     All other components within normal limits  Narrative:     Estimated Glomerular Filtration Rate (eGFR) is calculated using the CKD-EPI (2021) equation, intended for patients 53 years of age and older. If gender is not documented or "unknown", there will be no eGFR calculation.     TROPONIN-I - Abnormal; Notable for the following components:    TROPONIN I 17 (*)     All other components within normal limits   TROPONIN-I - Abnormal; Notable for the following components:    TROPONIN I 18 (*)     All other components within normal limits   B-TYPE NATRIURETIC PEPTIDE (BNP),PLASMA - Abnormal; Notable for the following components:    BNP 580 (*)     All other components within normal limits    Narrative:                                 Class 1: 101-250 pg/mL                              Class  2: 251-550 pg/mL                              Class 3: 551-900 pg/mL                              Class 4: >901 pg/mL     The [place]  [employer] has developed a four-stage functional classification system for CHF that is based on a subjective interpretation of the severity of a patient's clinical signs and symptoms.    Class 1 - Patients have no limitations on physical activity and have no symptoms with ordinary physical activity.    Class 2 - Patients have a slight limitation of physical activity and have symptoms with ordinary physical activity.    Class 3 - Patients have a marked limitation of physical activity and have symptoms with less than ordinary physical activity, but not at rest.    Class 4 - Patients are unable to perform any physical activity without discomfort.   CBC WITH DIFF - Abnormal; Notable for the following components:    MCHC 32.1 (*)     NEUTROPHIL # 8.90 (*)     All other components within normal limits   PT/INR - Normal    Narrative:     In the setting of warfarin therapy, a moderate-intensity INR goal range is 2.0 to 3.0 and a high-intensity INR goal range is 2.5 to 3.5.    INR is ONLY validated to determine the level of anticoagulation with vitamin K antagonists (warfarin). Other factors may elevate the INR including but not limited to direct oral anticoagulants (DOACs), liver dysfunction, vitamin K deficiency, DIC, factor deficiencies, and factor inhibitors.   PTT (PARTIAL THROMBOPLASTIN TIME) - Normal   MAGNESIUM  - Normal   CBC/DIFF    Narrative:     The following orders were created for panel order CBC/DIFF.  Procedure                               Abnormality         Status                     ---------                               -----------         ------  CBC WITH DIFF[[id number]]                Abnormal            Final result                 Please view results for these tests on the individual orders.       XR CHEST PA AND LATERAL   Final Result by  Edi, Radresults In (05/03 2157)   Cardiomegaly and Central pulmonary vascular congestion, consistent with history of CHF.      Emphysema.               Radiologist location ID: [facility]             Medical Decision Making          Medical Decision Making        Studies Assessed:  LAB WORK, IMAGING, EKG    EKG:   This EKG interpreted by me shows:    Rate: 62    Interpretation:  NORMAL AXIS, SINUS RHYTHM, RATE 62, NONSPECIFIC ST-T WAVE CHANGES      MDM Narrative:  PATIENT PRESENTED TO THE EMERGENCY DEPARTMENT WITH COMPLAINTS OF PAIN IN HER FEET WITH CONCERN FOR SWELLING.  PATIENT STATES THAT SHE HAS BEEN EXPERIENCING SIMILAR SYMPTOMS SINCE SHE WAS DISCHARGED FROM THIS FACILITY APPROXIMATELY 1 WEEK AGO.  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  SHE IS WEAR OXYGEN AT HOME AND DOES ADMIT TO SOME INCREASED DIFFICULTY BREATHING OVER THE PAST COUPLE OF DAYS.  HOWEVER, SHE DENIES ANY FEVER, CHEST PAIN, PALPITATIONS OR RECENT CHANGES IN BLADDER HABITS.  PATIENT DOES ADMIT TO SOME INTERMITTENT DIARRHEA SINCE BEING DISCHARGED, BUT DIARRHEA HAS NOT BEEN SIGNIFICANT AND THERE WAS NO MAJOR CONCERN FOR C DIFF AT THIS TIME.  PATIENT STATES THAT HER FEET JUST STARTED HURTING TODAY AND SHE THOUGHT THAT THEY WERE SWOLLEN AND PRESENTED TO THE EMERGENCY DEPARTMENT FOR FURTHER EVALUATION.  SHE DENIES ANY FURTHER COMPLAINTS OR CONCERNS.  FAMILY MEMBER DENIES ANY FURTHER CONCERNS.  PHYSICAL EXAMINATION DID REVEAL WHEEZING NOTED THROUGHOUT LUNG FIELDS, BUT NO SIGNIFICANT INCREASED RESPIRATORY EFFORT.  HEART RATE AND RHYTHM WITHIN NORMAL LIMITS.  THERE WAS NO SWELLING WHATSOEVER OF THE LOWER EXTREMITIES OR THE FEET.  THERE WAS NO DISCOLORATION.  PULSES ARE INTACT DISTALLY.  CAPILLARY REFILL WITHIN NORMAL LIMITS AND THERE WAS NO LOSS OF SENSATION.  PATIENT HAS NO MOTOR DEFICITS NOTED ON EXAMINATION.  THERE WAS NO CALF TENDERNESS TO PALPATION OR CONCERN FOR DEEP VEIN THROMBOSIS AT THIS TIME.  LASIX ,  SOLU-MEDROL , DUONEB AND PULMICORT  WERE ORDERED.  LAB WORK AND IMAGING ORDERED.  PATIENT WAS STABLE.      ED Course as of 11/05/23 0247   Sat Nov 04, 2023   2135 WBC 11.8, HEMOGLOBIN 11.3, PLATELET COUNT 217   2154 PTT 25.7, PT 11.0, INR 0.97   2156 SODIUM 139, POTASSIUM 3.4, BUN 26, CREATININE 1.01, GLUCOSE 108, TOTAL BILIRUBIN 0.8, AST 14, ALT 14, TOTAL ALKALINE PHOSPHATASE 56, MAGNESIUM  2.2   2157 CHEST X-RAY REVEALED:   FINDINGS:     Emphysema. Chronic interstitial thickening. Basilar scarring and atelectasis. No pneumothorax or pleural effusion.     Cardiomegaly and Central pulmonary vascular congestion. Degenerative changes. Exaggerated thoracic kyphosis and thoracic spondylosis. Chronic midthoracic compression deformity and chronic right rib fractures.     IMPRESSION:  Cardiomegaly and Central pulmonary vascular congestion, consistent with history of CHF.     Emphysema   2205  TROPONIN 17   2206 BNP 580   2206 LASIX  WAS ORDERED.   2206 DUONEB, PULMICORT  AND SOLU-MEDROL  ALSO ORDERED.   2228 REPEAT TROPONIN IN 1 HOUR WAS ONLY 18     ON REEXAMINATION, PATIENT HAS HAD SIGNIFICANT IMPROVEMENT SYMPTOMS.  BREATH SOUNDS CLEAR BILATERALLY WITHOUT EVIDENCE OF RALES, RHONCHI OR WHEEZING APPRECIATED ON REEXAMINATION.  PATIENT STATES THAT HER FEET ARE ALSO FEELING BETTER AND HAS NO FURTHER CONCERNS.  PATIENT WAS COUNSELED AND EDUCATED ON SUPPORTIVE CARE AT HOME, INCLUDING TAKING HER PRESCRIBED MEDICATIONS AS WELL AS CLOSE FOLLOW-UP WITH HER PCP TO RECHECK SYMPTOMS AND TO RECHECK LAB WORK.  PATIENT WAS ALSO ENCOURAGED TO FOLLOW UP WITH HER ESTABLISHED CARDIOLOGIST DR. Pandora Bogaert AS SOON AS POSSIBLE FOR FURTHER EVALUATION.  PATIENT WAS GIVEN VERY CLEAR INSTRUCTIONS TO RETURN TO THE EMERGENCY DEPARTMENT FOR ANY NEW OR WORSENING SYMPTOMS.  PATIENT VERBALIZED UNDERSTANDING.  PATIENT WAS SMILING AND STABLE AT TIME OF DISCHARGE.  ALL QUESTIONS WERE ANSWERED TO SATISFACTION.    Medications Ordered/Administered in the ED   furosemide   (LASIX ) 10 mg/mL injection (20 mg Intravenous Given 11/04/23 2251)   methylPREDNISolone  sod succ (SOLU-medrol ) 125 mg/2 mL injection (62.5 mg Intravenous Given 11/04/23 2251)   ipratropium-albuterol  0.5 mg-3 mg(2.5 mg base)/3 mL Solution for Nebulization (3 mL Nebulization Given 11/04/23 2229)   budesonide  (PULMICORT  RESPULES) 0.5 mg/2 mL nebulizer suspension (1 mg Nebulization Given 11/04/23 2229)       Following the history, physical exam, and ED workup, the patient was deemed stable and suitable for discharge. The patient/caregiver was advised to return to the ED for any new or worsening symptoms. Discharge medications, and follow-up instructions were discussed with the patient/caregiver in detail, who verbalizes understanding. The patient/caregiver is in agreement and is comfortable with the plan of care.    Disposition: Discharged         Current Discharge Medication List        CONTINUE these medications - NO CHANGES were made during your visit.        Details   albuterol  sulfate 2.5 mg /3 mL (0.083 %) nebulizer solution  Commonly known as: PROVENTIL    2.5 mg, DAILY PRN  Refills: 0     aspirin  81 mg Tablet, Chewable   81 mg, Daily  Refills: 0     atorvastatin  40 mg Tablet  Commonly known as: LIPITOR   40 mg, EVERY EVENING  Refills: 0     ergocalciferol (vitamin D2) 1,250 mcg (50,000 unit) Capsule  Commonly known as: DRISDOL   50,000 Units, EVERY 7 DAYS  Refills: 0     escitalopram  oxalate 20 mg Tablet  Commonly known as: LEXAPRO    20 mg, Daily  Refills: 0     furosemide  40 mg Tablet  Commonly known as: LASIX    40 mg, Daily  Refills: 0     ipratropium-albuteroL  0.5 mg-3 mg(2.5 mg base)/3 mL nebulizer solution  Commonly known as: DUONEB   3 mL, Nebulization, 4 TIMES DAILY  Refills: 0     Levocetirizine 5 mg Tablet  Commonly known as: XYZAL   5 mg, EVERY EVENING  Refills: 0     metoprolol  succinate 25 mg Tablet Sustained Release 24 hr  Commonly known as: TOPROL -XL   25 mg, Daily  Refills: 0     nicotine  14 mg/24 hr Patch  24 hr  Commonly known as: NICODERM CQ    14 mg, Transdermal, Daily  Qty: 30 Patch  Refills: 0     omeprazole 20 mg Capsule,  Delayed Release(E.C.)  Commonly known as: PRILOSEC   20 mg, Daily  Refills: 0     ondansetron  4 mg Tablet, Rapid Dissolve  Commonly known as: ZOFRAN  ODT   4 mg, Oral, EVERY 8 HOURS PRN  Qty: 12 Tablet  Refills: 0     OXYGEN-AIR DELIVERY SYSTEMS DEACT   3L NC at home  Refills: 0     polyethylene glycol 17 gram Powder in Packet  Commonly known as: MIRALAX    34 g, Oral, 2 TIMES DAILY  Refills: 0     potassium chloride  20 mEq Tab Sust.Rel. Particle/Crystal  Commonly known as: K-DUR   20 mEq, Daily  Refills: 0     sennosides-docusate sodium  8.6-50 mg Tablet  Commonly known as: SENOKOT-S   2 Tablets, Oral, 2 TIMES DAILY  Refills: 0     Trelegy Ellipta 100-62.5-25 mcg Disk with Device  Generic drug: fluticasone -umeclidin-vilanter   1 Inhalation, Daily  Refills: 0            ASK your doctor about these medications.        Details   guaiFENesin  600 mg Tablet Extended Release 12hr  Commonly known as: MUCINEX   Ask about: Should I take this medication?   600 mg, Oral, 2 TIMES DAILY  Qty: 10 Tablet  Refills: 0     predniSONE  10 mg Tablet  Commonly known as: DELTASONE   Ask about: Should I take this medication?   40 mg, Oral, Daily  Qty: 20 Tablet  Refills: 0            Follow up:   Ailene Housekeeper, MD  [address]  [place] [address]  [phone]    In 1 day      [facility] - Emergency Department  [address]  [place]  [address]  [phone]    As needed, If symptoms worsen               Clinical Impression   Congestive heart failure, unspecified HF chronicity, unspecified heart failure type (CMS HCC) (Primary)   Chronic obstructive pulmonary disease, unspecified COPD type (CMS HCC)   Coronary artery disease, unspecified vessel or lesion type, unspecified whether angina present, unspecified whether native or transplanted heart   Emphysema lung          Discharge Medication List as of 11/05/2023 12:21 AM            Renne Casa, DO

## 2023-11-05 NOTE — ED Nurses Note (Signed)
 Pt was given discharge paperwork.  IV was taken out and intact.  No questions or concerns at this time.  Pt left department via wheelchair with family.

## 2023-11-06 DIAGNOSIS — R9431 Abnormal electrocardiogram [ECG] [EKG]: Secondary | ICD-10-CM

## 2023-11-06 LAB — ECG 12 LEAD
Atrial Rate: 62 {beats}/min
Calculated R Axis: 27 degrees
Calculated T Axis: -73 degrees
PR Interval: 100 ms
QRS Duration: 86 ms
QT Interval: 412 ms
QTC Calculation: 418 ms
Ventricular rate: 62 {beats}/min

## 2023-11-10 ENCOUNTER — Emergency Department
Admission: EM | Admit: 2023-11-10 | Discharge: 2023-11-11 | Disposition: A | Discharge status: Home / Self Care | Payer: Medicare (Managed Care) | Source: Home / Self Care | Attending: Emergency Medicine | Admitting: Emergency Medicine

## 2023-11-10 ENCOUNTER — Other Ambulatory Visit: Payer: Self-pay

## 2023-11-10 ENCOUNTER — Emergency Department (HOSPITAL_BASED_OUTPATIENT_CLINIC_OR_DEPARTMENT_OTHER): Payer: Medicare (Managed Care)

## 2023-11-10 ENCOUNTER — Encounter (HOSPITAL_BASED_OUTPATIENT_CLINIC_OR_DEPARTMENT_OTHER): Payer: Self-pay

## 2023-11-10 DIAGNOSIS — I509 Heart failure, unspecified: Secondary | ICD-10-CM | POA: Insufficient documentation

## 2023-11-10 DIAGNOSIS — J441 Chronic obstructive pulmonary disease with (acute) exacerbation: Secondary | ICD-10-CM | POA: Insufficient documentation

## 2023-11-10 DIAGNOSIS — J9811 Atelectasis: Secondary | ICD-10-CM | POA: Insufficient documentation

## 2023-11-10 DIAGNOSIS — Z1152 Encounter for screening for COVID-19: Secondary | ICD-10-CM | POA: Insufficient documentation

## 2023-11-10 DIAGNOSIS — Z9981 Dependence on supplemental oxygen: Secondary | ICD-10-CM | POA: Insufficient documentation

## 2023-11-10 DIAGNOSIS — R0989 Other specified symptoms and signs involving the circulatory and respiratory systems: Secondary | ICD-10-CM

## 2023-11-10 DIAGNOSIS — R9431 Abnormal electrocardiogram [ECG] [EKG]: Secondary | ICD-10-CM | POA: Insufficient documentation

## 2023-11-10 DIAGNOSIS — Z87891 Personal history of nicotine dependence: Secondary | ICD-10-CM

## 2023-11-10 DIAGNOSIS — I491 Atrial premature depolarization: Secondary | ICD-10-CM | POA: Insufficient documentation

## 2023-11-10 LAB — COMPREHENSIVE METABOLIC PANEL, NON-FASTING
ALBUMIN/GLOBULIN RATIO: 1 (ref 0.8–1.4)
ALBUMIN: 3.1 g/dL — ABNORMAL LOW (ref 3.4–5.0)
ALKALINE PHOSPHATASE: 76 U/L (ref 46–116)
ALT (SGPT): 13 U/L (ref <=78)
ANION GAP: 7 mmol/L (ref 4–13)
AST (SGOT): 16 U/L (ref 15–37)
BILIRUBIN TOTAL: 1.2 mg/dL — ABNORMAL HIGH (ref 0.2–1.0)
BUN/CREA RATIO: 18
BUN: 18 mg/dL (ref 7–18)
CALCIUM, CORRECTED: 9.7 mg/dL
CALCIUM: 9 mg/dL (ref 8.5–10.1)
CHLORIDE: 102 mmol/L (ref 98–107)
CO2 TOTAL: 30 mmol/L (ref 21–32)
CREATININE: 1.02 mg/dL (ref 0.55–1.02)
ESTIMATED GFR: 58 mL/min/{1.73_m2} — ABNORMAL LOW (ref >59)
GLOBULIN: 3.2
GLUCOSE: 110 mg/dL — ABNORMAL HIGH (ref 74–106)
OSMOLALITY, CALCULATED: 280 mosm/kg (ref 270–290)
POTASSIUM: 3 mmol/L — ABNORMAL LOW (ref 3.5–5.1)
PROTEIN TOTAL: 6.3 g/dL — ABNORMAL LOW (ref 6.4–8.2)
SODIUM: 139 mmol/L (ref 136–145)

## 2023-11-10 LAB — CBC WITH DIFF
BASOPHIL #: 0.03 10*3/uL (ref 0.00–0.10)
BASOPHIL %: 0 % (ref 0–1)
EOSINOPHIL #: 0.09 10*3/uL (ref 0.00–0.50)
EOSINOPHIL %: 1 % (ref 1–7)
HCT: 34.8 % (ref 31.2–41.9)
HGB: 11.2 g/dL (ref 10.9–14.3)
LYMPHOCYTE #: 1.21 10*3/uL (ref 1.10–3.10)
LYMPHOCYTE %: 11 % — ABNORMAL LOW (ref 16–46)
MCH: 28.1 pg (ref 24.7–32.8)
MCHC: 32.2 g/dL — ABNORMAL LOW (ref 32.3–35.6)
MCV: 86.9 fL (ref 75.5–95.3)
MONOCYTE #: 0.92 10*3/uL — ABNORMAL HIGH (ref 0.20–0.90)
MONOCYTE %: 9 % (ref 4–11)
MPV: 8.8 fL (ref 7.9–10.8)
NEUTROPHIL #: 8.38 10*3/uL — ABNORMAL HIGH (ref 1.90–8.20)
NEUTROPHIL %: 79 % — ABNORMAL HIGH (ref 43–77)
PLATELETS: 175 10*3/uL (ref 140–440)
RBC: 4 10*6/uL (ref 3.63–4.92)
RDW: 18.6 % — ABNORMAL HIGH (ref 12.3–17.7)
WBC: 10.6 10*3/uL (ref 3.8–11.8)

## 2023-11-10 LAB — COVID-19, FLU A/B, RSV RAPID BY PCR
INFLUENZA VIRUS TYPE A: NOT DETECTED
INFLUENZA VIRUS TYPE B: NOT DETECTED
RESPIRATORY SYNCTIAL VIRUS (RSV): NOT DETECTED
SARS-CoV-2: NOT DETECTED

## 2023-11-10 LAB — BLOOD GAS
%FIO2 (ARTERIAL): 32 %
BASE EXCESS (ARTERIAL): 5.6 mmol/L — ABNORMAL HIGH (ref 0.0–3.0)
BICARBONATE (ARTERIAL): 29.3 mmol/L — ABNORMAL HIGH (ref 21.0–28.0)
O2 SATURATION (ARTERIAL): 97.8 % (ref 94.0–98.0)
PAO2/FIO2 RATIO: 297
PCO2 (ARTERIAL): 41 mmHg (ref 35–45)
PH (ARTERIAL): 7.47 — ABNORMAL HIGH (ref 7.35–7.45)
PO2 (ARTERIAL): 95 mmHg (ref 83–108)

## 2023-11-10 LAB — NT-PROBNP: NT-PROBNP: 5026 pg/mL — ABNORMAL HIGH (ref <=125)

## 2023-11-10 LAB — PTT (PARTIAL THROMBOPLASTIN TIME): APTT: 29.4 s (ref 25.0–38.0)

## 2023-11-10 LAB — PT/INR
INR: 1.03 (ref 0.84–1.10)
PROTHROMBIN TIME: 11.6 s (ref 9.8–12.7)

## 2023-11-10 LAB — MAGNESIUM: MAGNESIUM: 1.8 mg/dL (ref 1.8–2.4)

## 2023-11-10 LAB — D-DIMER: D-DIMER: 229 ng{FEU}/mL (ref 215–500)

## 2023-11-10 LAB — TROPONIN-I: TROPONIN I: 21 ng/L — ABNORMAL HIGH (ref <15)

## 2023-11-10 MED ORDER — METHYLPREDNISOLONE SOD SUCC 125 MG SOLUTION FOR INJECTION WRAPPER
INTRAVENOUS | Status: AC
Start: 2023-11-10 — End: 2023-11-10
  Filled 2023-11-10: qty 2

## 2023-11-10 MED ORDER — METHYLPREDNISOLONE SOD SUCC 125 MG SOLUTION FOR INJECTION WRAPPER
125.0000 mg | INTRAVENOUS | Status: AC
Start: 2023-11-10 — End: 2023-11-10
  Administered 2023-11-10: 125 mg via INTRAVENOUS

## 2023-11-10 MED ORDER — FUROSEMIDE 10 MG/ML INJECTION SOLUTION
INTRAMUSCULAR | Status: AC
Start: 2023-11-10 — End: 2023-11-10
  Filled 2023-11-10: qty 4

## 2023-11-10 MED ORDER — POTASSIUM BICARBONATE-CITRIC ACID 25 MEQ EFFERVESCENT TABLET
50.0000 meq | EFFERVESCENT_TABLET | ORAL | Status: AC
Start: 2023-11-11 — End: 2023-11-10
  Administered 2023-11-10: 50 meq via ORAL

## 2023-11-10 MED ORDER — FUROSEMIDE 10 MG/ML INJECTION SOLUTION
20.0000 mg | INTRAMUSCULAR | Status: AC
Start: 2023-11-11 — End: 2023-11-10
  Administered 2023-11-10: 20 mg via INTRAVENOUS

## 2023-11-10 MED ORDER — POTASSIUM BICARBONATE-CITRIC ACID 25 MEQ EFFERVESCENT TABLET
EFFERVESCENT_TABLET | ORAL | Status: AC
Start: 2023-11-10 — End: 2023-11-10
  Filled 2023-11-10: qty 2

## 2023-11-10 NOTE — ED Provider Notes (Signed)
 [facility], [facility] - Emergency Department  ED Primary Provider Note  History of Present Illness   Chief Complaint   Patient presents with    Decreased Oxygen Level Symptomatic     Patient states that oxygen level decreased today, patient wears 3L at her home, patient has hx of COPD. Patient has been Sat high 80's at home. Patient 88 on 3L on arrival to the ED.      Arrival: The patient arrived by Car  Misty Johnson is a 73 y.o. female who had concerns including Decreased Oxygen Level Symptomatic. Pt states she can't keep her oxygen level up today has had a slight cough no more sob than usual. On 3 liters sat in 70's at home. Better now.  Review of Systems   Constitutional: No fever, chills or weakness   Skin: No rash or diaphoresis  HENT: No headaches, or congestion  Eyes: No vision changes or photophobia   Cardio: No chest pain, palpitations or leg swelling   Respiratory: + low sat,  cough, wheezing + SOB  GI:  No nausea, vomiting or stool changes  GU:  No dysuria, hematuria, or increased frequency  MSK: No muscle aches, joint or back pain  Neuro: No seizures, LOC, numbness, tingling, or focal weakness  Psychiatric: No depression, SI or substance abuse  All other systems reviewed and are negative.      History Reviewed This Encounter: all noted and reviewed.     Physical Exam   ED Triage Vitals   BP (Non-Invasive) 11/10/23 2154 (!) 111/45   Heart Rate 11/10/23 2154 74   Respiratory Rate 11/10/23 2154 (!) 22   Temperature 11/10/23 2154 36.9 C (98.4 F)   SpO2 11/10/23 2154 (!) 88 %   Weight 11/10/23 2203 74.5 kg (164 lb 4.8 oz)   Height 11/10/23 2154 1.626 m (5\' 4" )     Constitutional:  73 y.o. female who appears in no distress. Normal color, no cyanosis.   HENT:   Head: Normocephalic and atraumatic.   Mouth/Throat: Oropharynx is clear and moist.   Eyes: EOMI, PERRL   Neck: Trachea midline. Neck supple.  Cardiovascular: RRR, No murmurs, rubs or gallops. Intact distal  pulses.  Pulmonary/Chest: BS equal bilaterally diminished bibasilar.  No respiratory distress. No wheezes, rales or chest tenderness.   Abdominal: Bowel sounds present and normal. Abdomen soft, no tenderness, no rebound and no guarding.  Back: No midline spinal tenderness, no paraspinal tenderness, no CVA tenderness.           Musculoskeletal: No edema, tenderness or deformity.  Skin: warm and dry. No rash, erythema, pallor or cyanosis  Psychiatric: normal mood and affect. Behavior is normal.   Neurological: Patient keenly alert and responsive, easily able to raise eyebrows, facial muscles/expressions symmetric, speaking in fluent sentences, moving all extremities equally and fully  Patient Data   XR CHEST AP   Final Result by Edi, Radresults In (05/09 2248)   Unchanged linear atelectasis in the left lower lung zone. No additional acute findings.            Radiologist location ID: [id number]           Medical Decision Making   Diff dx of CHF COPD exa pneumonia pe.   No concerning values on abg. 2255 care left to dr Lezli Danek.   Medications Ordered/Administered in the ED   methylPREDNISolone  sod succ (SOLU-medrol ) 125 mg/2 mL injection (has no administration in time range)     Clinical Impression  COPD exacerbation (CMS HCC) (Primary)       Disposition: Transfered to Another Facility

## 2023-11-10 NOTE — ED Nurses Note (Signed)
 Patient complaining of low oxygen saturation and shortness of breath. Patient states that she has been feeling short of breath for a couple of days, and tonight she checked her oxygen saturation and it was low 80's at home. Patient has a history of COPD. Patient chronically wears 3L of oxygen via nasal cannula at home. Patients oxygen saturation 88% during triage vitals, patients oxygen saturation 93 in the room at this time.  No further concerns or complaints noted at this time.

## 2023-11-10 NOTE — Respiratory Therapy (Signed)
 Latest Reference Range & Units 11/10/23 22:37   %FIO2 (ARTERIAL) % 32   PH 7.35 - 7.45  7.47 (H)   PCO2 35 - 45 mm/Hg 41   PO2 83 - 108 mm/Hg 95   BICARBONATE 21.0 - 28.0 mmol/L 29.3 (H)   BASE EXCESS 0.0 - 3.0 mmol/L 5.6 (H)   PAO2/FIO2 RATIO  297   O2 SATURATION (ARTERIAL) 94.0 - 98.0 % 97.8   (H): Data is abnormally high  Pete Brand FNP notified

## 2023-11-11 DIAGNOSIS — R9431 Abnormal electrocardiogram [ECG] [EKG]: Secondary | ICD-10-CM

## 2023-11-11 DIAGNOSIS — I491 Atrial premature depolarization: Secondary | ICD-10-CM

## 2023-11-11 DIAGNOSIS — I517 Cardiomegaly: Secondary | ICD-10-CM

## 2023-11-11 LAB — ECG 12 LEAD
Atrial Rate: 68 {beats}/min
Calculated P Axis: 44 degrees
Calculated R Axis: 34 degrees
Calculated T Axis: 112 degrees
PR Interval: 80 ms
QRS Duration: 104 ms
QT Interval: 472 ms
QTC Calculation: 501 ms
Ventricular rate: 68 {beats}/min

## 2023-11-11 LAB — TROPONIN-I: TROPONIN I: 19 ng/L — ABNORMAL HIGH (ref <15)

## 2023-11-11 MED ORDER — PREDNISONE 50 MG TABLET
50.0000 mg | ORAL_TABLET | Freq: Every day | ORAL | 0 refills | Status: AC
Start: 2023-11-11 — End: 2023-11-16

## 2023-11-11 NOTE — ED Nurses Note (Signed)
 Patient discharged home with family.  AVS reviewed with patient/care giver.  A written copy of the AVS and discharge instructions was given to the patient/care giver. Scripts escribed to preferred pharmacy. Questions sufficiently answered as needed.  Patient/care giver encouraged to follow up with PCP as indicated.  In the event of an emergency, patient/care giver instructed to call 911 or go to the nearest emergency room.

## 2023-11-11 NOTE — ED Attending Note (Signed)
 11/11/2023     [facility], [facility] - Emergency Department  Emergency Department  Course Note    Care/report received from  mid-level  Per report:  Misty Johnson is a 73 y.o. female who had concerns including Decreased Oxygen Level Symptomatic.  Reviewed    Pending labs/imaging/consults:  Reviewed  Plan:  Reviewed    Course:   ED Course as of 11/11/23 0105   Sat Nov 11, 2023   0059 Troponin negative.  Patient much improved after treatment.  Patient requesting to be discharged home     Following the history, physical exam, and ED workup, the patient was deemed stable and suitable for discharge. The patient/caregiver was advised to return to the ED for any new or worsening symptoms. Discharge medications, and follow-up instructions were discussed with the patient/caregiver in detail, who verbalizes understanding. The patient/caregiver is in agreement and is comfortable with the plan of care.    Disposition: Discharged         Current Discharge Medication List        START taking these medications.        Details   * predniSONE  50 mg Tablet  Commonly known as: DELTASONE    50 mg, Oral, Daily  Qty: 5 Tablet  Refills: 0           * This list has 1 medication(s) that are the same as other medications prescribed for you. Read the directions carefully, and ask your doctor or other care provider to review them with you.                CONTINUE these medications - NO CHANGES were made during your visit.        Details   albuterol  sulfate 2.5 mg /3 mL (0.083 %) nebulizer solution  Commonly known as: PROVENTIL    2.5 mg, DAILY PRN  Refills: 0     aspirin  81 mg Tablet, Chewable   81 mg, Daily  Refills: 0     atorvastatin  40 mg Tablet  Commonly known as: LIPITOR   40 mg, EVERY EVENING  Refills: 0     ergocalciferol (vitamin D2) 1,250 mcg (50,000 unit) Capsule  Commonly known as: DRISDOL   50,000 Units, EVERY 7 DAYS  Refills: 0     escitalopram  oxalate 20 mg Tablet  Commonly known as: LEXAPRO    20 mg,  Daily  Refills: 0     furosemide  40 mg Tablet  Commonly known as: LASIX    40 mg, Daily  Refills: 0     ipratropium-albuteroL  0.5 mg-3 mg(2.5 mg base)/3 mL nebulizer solution  Commonly known as: DUONEB   3 mL, Nebulization, 4 TIMES DAILY  Refills: 0     Levocetirizine 5 mg Tablet  Commonly known as: XYZAL   5 mg, EVERY EVENING  Refills: 0     metoprolol  succinate 25 mg Tablet Sustained Release 24 hr  Commonly known as: TOPROL -XL   25 mg, Daily  Refills: 0     nicotine  14 mg/24 hr Patch 24 hr  Commonly known as: NICODERM CQ    14 mg, Transdermal, Daily  Qty: 30 Patch  Refills: 0     omeprazole 20 mg Capsule, Delayed Release(E.C.)  Commonly known as: PRILOSEC   20 mg, Daily  Refills: 0     ondansetron  4 mg Tablet, Rapid Dissolve  Commonly known as: ZOFRAN  ODT   4 mg, Oral, EVERY 8 HOURS PRN  Qty: 12 Tablet  Refills: 0     OXYGEN-AIR DELIVERY  SYSTEMS DEACT   3L NC at home  Refills: 0     polyethylene glycol 17 gram Powder in Packet  Commonly known as: MIRALAX    34 g, Oral, 2 TIMES DAILY  Refills: 0     potassium chloride  20 mEq Tab Sust.Rel. Particle/Crystal  Commonly known as: K-DUR   20 mEq, Daily  Refills: 0     sennosides-docusate sodium  8.6-50 mg Tablet  Commonly known as: SENOKOT-S   2 Tablets, Oral, 2 TIMES DAILY  Refills: 0     Trelegy Ellipta 100-62.5-25 mcg Disk with Device  Generic drug: fluticasone -umeclidin-vilanter   1 Inhalation, Daily  Refills: 0            ASK your doctor about these medications.        Details   guaiFENesin  600 mg Tablet Extended Release 12hr  Commonly known as: MUCINEX   Ask about: Should I take this medication?   600 mg, Oral, 2 TIMES DAILY  Qty: 10 Tablet  Refills: 0     * predniSONE  10 mg Tablet  Commonly known as: DELTASONE   Ask about: Should I take this medication?   40 mg, Oral, Daily  Qty: 20 Tablet  Refills: 0           * This list has 1 medication(s) that are the same as other medications prescribed for you. Read the directions carefully, and ask your doctor or other care  provider to review them with you.                Follow up:   Ailene Housekeeper, MD  [address]  [place] [address]  [phone]    Schedule an appointment as soon as possible for a visit in 3 days  If symptoms worsen      Clinical Impression   COPD exacerbation (CMS HCC) (Primary)   Congestive heart failure, unspecified HF chronicity, unspecified heart failure type (CMS HCC)         Ferris Hua, MD

## 2023-11-11 NOTE — Discharge Instructions (Signed)
Follow up with your primary physician

## 2024-01-16 ENCOUNTER — Ambulatory Visit (HOSPITAL_COMMUNITY): Payer: Self-pay

## 2024-02-08 ENCOUNTER — Other Ambulatory Visit: Payer: Self-pay

## 2024-02-08 ENCOUNTER — Other Ambulatory Visit (HOSPITAL_COMMUNITY): Payer: Self-pay

## 2024-02-08 ENCOUNTER — Encounter (HOSPITAL_BASED_OUTPATIENT_CLINIC_OR_DEPARTMENT_OTHER): Payer: Self-pay

## 2024-02-08 ENCOUNTER — Ambulatory Visit (HOSPITAL_COMMUNITY): Payer: Self-pay | Admitting: SLEEP MEDICINE

## 2024-02-08 ENCOUNTER — Emergency Department (HOSPITAL_BASED_OUTPATIENT_CLINIC_OR_DEPARTMENT_OTHER): Payer: Medicare (Managed Care)

## 2024-02-08 ENCOUNTER — Emergency Department
Admission: EM | Admit: 2024-02-08 | Discharge: 2024-02-08 | Disposition: A | Discharge status: Home / Self Care | Payer: Medicare (Managed Care)

## 2024-02-08 DIAGNOSIS — R0600 Dyspnea, unspecified: Secondary | ICD-10-CM

## 2024-02-08 DIAGNOSIS — R9431 Abnormal electrocardiogram [ECG] [EKG]: Secondary | ICD-10-CM | POA: Insufficient documentation

## 2024-02-08 DIAGNOSIS — J449 Chronic obstructive pulmonary disease, unspecified: Secondary | ICD-10-CM

## 2024-02-08 DIAGNOSIS — J441 Chronic obstructive pulmonary disease with (acute) exacerbation: Secondary | ICD-10-CM | POA: Insufficient documentation

## 2024-02-08 DIAGNOSIS — J439 Emphysema, unspecified: Secondary | ICD-10-CM

## 2024-02-08 DIAGNOSIS — I110 Hypertensive heart disease with heart failure: Secondary | ICD-10-CM

## 2024-02-08 DIAGNOSIS — I509 Heart failure, unspecified: Secondary | ICD-10-CM

## 2024-02-08 DIAGNOSIS — Z9981 Dependence on supplemental oxygen: Secondary | ICD-10-CM

## 2024-02-08 DIAGNOSIS — F17210 Nicotine dependence, cigarettes, uncomplicated: Secondary | ICD-10-CM

## 2024-02-08 DIAGNOSIS — J9611 Chronic respiratory failure with hypoxia: Secondary | ICD-10-CM

## 2024-02-08 DIAGNOSIS — R001 Bradycardia, unspecified: Secondary | ICD-10-CM | POA: Insufficient documentation

## 2024-02-08 LAB — URINALYSIS, MACRO/MICRO
BILIRUBIN: NEGATIVE mg/dL
BLOOD: NEGATIVE mg/dL
GLUCOSE: NEGATIVE mg/dL
KETONES: NEGATIVE mg/dL
LEUKOCYTES: NEGATIVE WBCs/uL
NITRITE: NEGATIVE
PH: 7 (ref 4.6–8.0)
PROTEIN: NEGATIVE mg/dL
SPECIFIC GRAVITY: 1.015 (ref 1.003–1.035)
UROBILINOGEN: 0.2 mg/dL (ref 0.2–1.0)

## 2024-02-08 LAB — BLOOD GAS W/ LACTATE REFLEX
%FIO2 (ARTERIAL): 32 %
BASE EXCESS (ARTERIAL): 13.8 mmol/L — ABNORMAL HIGH (ref 0.0–3.0)
BICARBONATE (ARTERIAL): 35.5 mmol/L — ABNORMAL HIGH (ref 21.0–28.0)
LACTATE: 1.4 mmol/L (ref <=1.9)
O2 SATURATION (ARTERIAL): 90.2 % (ref 94.0–98.0)
PAO2/FIO2 RATIO: 172
PCO2 (ARTERIAL): 55 mmHg — ABNORMAL HIGH (ref 35–45)
PH (ARTERIAL): 7.47 — ABNORMAL HIGH (ref 7.35–7.45)
PO2 (ARTERIAL): 55 mmHg — ABNORMAL LOW (ref 83–108)

## 2024-02-08 LAB — CBC WITH DIFF
BASOPHIL #: 0.02 x10ˆ3/uL (ref 0.00–0.10)
BASOPHIL %: 0 % (ref 0–1)
EOSINOPHIL #: 0.05 x10ˆ3/uL (ref 0.00–0.50)
EOSINOPHIL %: 1 % (ref 1–7)
HCT: 30.5 % — ABNORMAL LOW (ref 31.2–41.9)
HGB: 9.8 g/dL — ABNORMAL LOW (ref 10.9–14.3)
LYMPHOCYTE #: 0.85 x10ˆ3/uL — ABNORMAL LOW (ref 1.10–3.10)
LYMPHOCYTE %: 14 % — ABNORMAL LOW (ref 16–46)
MCH: 27.4 pg (ref 24.7–32.8)
MCHC: 32.2 g/dL — ABNORMAL LOW (ref 32.3–35.6)
MCV: 85.2 fL (ref 75.5–95.3)
MONOCYTE #: 0.49 x10ˆ3/uL (ref 0.20–0.90)
MONOCYTE %: 8 % (ref 4–11)
MPV: 10.3 fL (ref 7.9–10.8)
NEUTROPHIL #: 4.79 x10ˆ3/uL (ref 1.90–8.20)
NEUTROPHIL %: 77 % (ref 43–77)
PLATELETS: 151 x10ˆ3/uL (ref 140–440)
RBC: 3.58 x10ˆ6/uL — ABNORMAL LOW (ref 3.63–4.92)
RDW: 18.9 % — ABNORMAL HIGH (ref 12.3–17.7)
WBC: 6.2 x10ˆ3/uL (ref 3.8–11.8)

## 2024-02-08 LAB — COVID-19, FLU A/B, RSV RAPID BY PCR
INFLUENZA VIRUS TYPE A: NOT DETECTED
INFLUENZA VIRUS TYPE B: NOT DETECTED
RESPIRATORY SYNCTIAL VIRUS (RSV): NOT DETECTED
SARS-CoV-2: NOT DETECTED

## 2024-02-08 LAB — TROPONIN-I
TROPONIN I: 14 ng/L (ref <15)
TROPONIN I: 13 ng/L (ref <15)
TROPONIN I: 13 ng/L (ref <15)

## 2024-02-08 LAB — COMPREHENSIVE METABOLIC PANEL, NON-FASTING
ALBUMIN/GLOBULIN RATIO: 1 (ref 0.8–1.4)
ALBUMIN: 3.4 g/dL (ref 3.4–5.0)
ALKALINE PHOSPHATASE: 73 U/L (ref 46–116)
ALT (SGPT): 21 U/L (ref <=78)
ANION GAP: 5 mmol/L (ref 4–13)
AST (SGOT): 17 U/L (ref 15–37)
BILIRUBIN TOTAL: 1 mg/dL (ref 0.2–1.0)
BUN/CREA RATIO: 20
BUN: 16 mg/dL (ref 7–18)
CALCIUM, CORRECTED: 9.8 mg/dL
CALCIUM: 9.3 mg/dL (ref 8.5–10.1)
CHLORIDE: 99 mmol/L (ref 98–107)
CO2 TOTAL: 36 mmol/L — ABNORMAL HIGH (ref 21–32)
CREATININE: 0.8 mg/dL (ref 0.55–1.02)
ESTIMATED GFR: 77 mL/min/1.73mˆ2 (ref >59)
GLOBULIN: 3.4
GLUCOSE: 99 mg/dL (ref 74–106)
OSMOLALITY, CALCULATED: 281 mosm/kg (ref 270–290)
POTASSIUM: 4 mmol/L (ref 3.5–5.1)
PROTEIN TOTAL: 6.8 g/dL (ref 6.4–8.2)
SODIUM: 140 mmol/L (ref 136–145)

## 2024-02-08 LAB — BLOOD GAS
%FIO2 (ARTERIAL): 36 %
BASE EXCESS (ARTERIAL): 11.2 mmol/L — ABNORMAL HIGH (ref 0.0–3.0)
BICARBONATE (ARTERIAL): 33.6 mmol/L — ABNORMAL HIGH (ref 21.0–28.0)
O2 SATURATION (ARTERIAL): 94 % (ref 94.0–98.0)
PAO2/FIO2 RATIO: 186
PCO2 (ARTERIAL): 52 mmHg — ABNORMAL HIGH (ref 35–45)
PH (ARTERIAL): 7.46 — ABNORMAL HIGH (ref 7.35–7.45)
PO2 (ARTERIAL): 67 mmHg — ABNORMAL LOW (ref 83–108)

## 2024-02-08 LAB — NT-PROBNP: NT-PROBNP: 1936 pg/mL — ABNORMAL HIGH (ref <=125)

## 2024-02-08 LAB — MAGNESIUM: MAGNESIUM: 2 mg/dL (ref 1.8–2.4)

## 2024-02-08 MED ORDER — ALBUTEROL SULFATE 2.5 MG/3 ML (0.083 %) SOLUTION FOR NEBULIZATION
2.5000 mg | INHALATION_SOLUTION | RESPIRATORY_TRACT | Status: AC
Start: 2024-02-08 — End: 2024-02-08
  Administered 2024-02-08: 2.5 mg via RESPIRATORY_TRACT

## 2024-02-08 MED ORDER — IPRATROPIUM 0.5 MG-ALBUTEROL 3 MG (2.5 MG BASE)/3 ML NEBULIZATION SOLN
3.0000 mL | INHALATION_SOLUTION | RESPIRATORY_TRACT | Status: AC
Start: 2024-02-08 — End: 2024-02-08
  Administered 2024-02-08: 3 mL via RESPIRATORY_TRACT

## 2024-02-08 MED ORDER — IPRATROPIUM 0.5 MG-ALBUTEROL 3 MG (2.5 MG BASE)/3 ML NEBULIZATION SOLN
INHALATION_SOLUTION | RESPIRATORY_TRACT | Status: AC
Start: 2024-02-08 — End: 2024-02-08
  Filled 2024-02-08: qty 3

## 2024-02-08 MED ORDER — ALBUTEROL SULFATE 2.5 MG/3 ML (0.083 %) SOLUTION FOR NEBULIZATION
2.5000 mg | INHALATION_SOLUTION | RESPIRATORY_TRACT | Status: DC
Start: 2024-02-08 — End: 2024-02-08
  Administered 2024-02-08: 0 mg via RESPIRATORY_TRACT

## 2024-02-08 MED ORDER — AMOXICILLIN 875 MG-POTASSIUM CLAVULANATE 125 MG TABLET
1.0000 | ORAL_TABLET | Freq: Two times a day (BID) | ORAL | 0 refills | Status: DC
Start: 2024-02-08 — End: 2024-02-19

## 2024-02-08 MED ORDER — RABIES VACCINE,HUMAN DIPLOID (PF) 2.5 UNIT INTRAMUSCULAR SOLUTION
2.5000 [IU] | Freq: Once | INTRAMUSCULAR | Status: DC
Start: 2024-02-08 — End: 2024-02-08

## 2024-02-08 MED ORDER — RABIES IMMUNE GLOBULIN (PF) 300 UNIT/ML INTRAMUSCULAR SOLUTION
20.0000 [IU]/kg | Freq: Once | INTRAMUSCULAR | Status: DC
Start: 2024-02-08 — End: 2024-02-08

## 2024-02-08 MED ORDER — ALBUTEROL SULFATE 2.5 MG/3 ML (0.083 %) SOLUTION FOR NEBULIZATION
INHALATION_SOLUTION | RESPIRATORY_TRACT | Status: AC
Start: 2024-02-08 — End: 2024-02-08
  Filled 2024-02-08: qty 3

## 2024-02-08 MED ORDER — METHYLPREDNISOLONE SOD SUCC 125 MG SOLUTION FOR INJECTION WRAPPER
INTRAVENOUS | Status: AC
Start: 2024-02-08 — End: 2024-02-08
  Filled 2024-02-08: qty 2

## 2024-02-08 MED ORDER — METHYLPREDNISOLONE SOD SUCC 125 MG SOLUTION FOR INJECTION WRAPPER
125.0000 mg | INTRAVENOUS | Status: AC
Start: 2024-02-08 — End: 2024-02-08
  Administered 2024-02-08: 125 mg via INTRAVENOUS

## 2024-02-08 MED ORDER — PREDNISONE 20 MG TABLET
20.0000 mg | ORAL_TABLET | Freq: Every day | ORAL | 0 refills | Status: DC
Start: 2024-02-08 — End: 2024-02-19

## 2024-02-08 NOTE — ED Provider Notes (Signed)
 [facility]  ED Primary Provider Note  Patient Name: Misty Johnson  Patient Age: 73 y.o.  Date of Birth: July 28, 1950    Chief Complaint: Shortness of Breath (Pt complains of shortness of breath x 1 week. Denies any chest pain. Was given a duo with albuteral by EMS.)        History of Present Illness       Misty Johnson is a 73 y.o. female who had concerns including Shortness of Breath.  73 year old female presents to emergency department with complaint of shortness of breath increasing over 1 week.  Patient does have chronic history of Chronic Obstructive Pulmonary Disease and congestive heart failure.  Patient denies any swelling or lower extremity edema.  Patient does state she is compliant with medications at home, including nebulizers and trilogy inhaler.  Patient is on continuous home O2 3 L/min.  Patient medications represent trilogy Ellipta, and nebulized albuterol  q.i.d..    Patient denies any chest pain, patient denies any abdominal pain, patient denies any hematuria or dysuria, patient denies any changes in bowel movements stool color and consistency.  Patient denies any pelvic discomfort.    Patient denies any musculoskeletal complaint.    Review of Systems     No other overt Review of Systems are noted to be positive except noted in the HPI.      Historical Data   History Reviewed This Encounter: Medical History  Surgical History  Family History  Social History      Physical Exam   ED Triage Vitals [02/08/24 1212]   BP (Non-Invasive) (!) 136/56   Heart Rate 63   Respiratory Rate 18   Temperature 36.7 C (98.1 F)   SpO2 94 %   Weight 73 kg (161 lb)   Height 1.676 m (5' 6)         Nursing notes reviewed for what could be assessed. Past Medical, Surgical, and Social history reviewed for what has been completed.     Constitutional: NAD. Well-Developed. Well Nourished.  Head: Normocephalic, atraumatic.  Mouth/Throat:  Pink and moist.  Eyes: EOM grossly intact,  conjunctiva normal Pupils PERRL bilateral.  Neck: Supple, No JVD, Trachea Midline no carotid bruits bilateral  Cardiovascular: Regular Rate and Rhythm, extremities well perfused.  S1-S2 heart sounds  Pulmonary/Chest: No respiratory distress. Lungs are symmetric to auscultation bilaterally.  Inspiratory wheezing bilateral, noted to be very coarse wheezes in lower bases.  Initial exam I did not note any rhonchi and/or rales in the lower bases.  Abdominal: Soft, non-tender, non-distended. Non peritoneal, no rebound, no guarding.  MSK: No Lower Extremity Edema. Moving all extremities with equal strength and purpose   Skin: Warm, dry, and pale.  Neuro: Appropriate, CN II-XII grossly intact. Gait normal  Psych: Pleasant     Procedures      Patient Data     Labs Ordered/Reviewed   BLOOD GAS W/ LACTATE REFLEX - Abnormal; Notable for the following components:       Result Value    PH (ARTERIAL) 7.47 (*)     PCO2 (ARTERIAL) 55 (*)     PO2 (ARTERIAL) 55 (*)     BICARBONATE (ARTERIAL) 35.5 (*)     BASE EXCESS (ARTERIAL) 13.8 (*)     All other components within normal limits    Narrative:     RRAD  3 L/M N.C.  ALLEN'S TEST PERFORMED  A reference range for Oxygen Saturation is provided but abnormal results will not flag  in Epic.   NT-PROBNP - Abnormal; Notable for the following components:    NT-PROBNP 1,936 (*)     All other components within normal limits   COMPREHENSIVE METABOLIC PANEL, NON-FASTING - Abnormal; Notable for the following components:    CO2 TOTAL 36 (*)     All other components within normal limits    Narrative:     Estimated Glomerular Filtration Rate (eGFR) is calculated using the CKD-EPI (2021) equation, intended for patients 65 years of age and older. If gender is not documented or unknown, there will be no eGFR calculation.   CBC WITH DIFF - Abnormal; Notable for the following components:    RBC 3.58 (*)     HGB 9.8 (*)     HCT 30.5 (*)     MCHC 32.2 (*)     RDW 18.9 (*)     LYMPHOCYTE % 14 (*)      LYMPHOCYTE # 0.85 (*)     All other components within normal limits   COVID-19, FLU A/B, RSV RAPID BY PCR - Normal    Narrative:     Results are for the simultaneous qualitative identification of SARS-CoV-2 (formerly 2019-nCoV), Influenza A, Influenza B, and RSV RNA. These etiologic agents are generally detectable in nasopharyngeal and nasal swabs during the ACUTE PHASE of infection. Hence, this test is intended to be performed on respiratory specimens collected from individuals with signs and symptoms of upper respiratory tract infection who meet Centers for Disease Control and Prevention (CDC) clinical and/or epidemiological criteria for Coronavirus Disease 2019 (COVID-19) testing. CDC COVID-19 criteria for testing on human specimens is available at [facility] webpage information for Healthcare Professionals: Coronavirus Disease 2019 (COVID-19) ([employer]).     False-negative results may occur if the virus has genomic mutations, insertions, deletions, or rearrangements or if performed very early in the course of illness. Otherwise, negative results indicate virus specific RNA targets are not detected, however negative results do not preclude SARS-CoV-2 infection/COVID-19, Influenza, or Respiratory syncytial virus infection. Results should not be used as the sole basis for patient management decisions. Negative results must be combined with clinical observations, patient history, and epidemiological information. If upper respiratory tract infection is still suspected based on exposure history together with other clinical findings, re-testing should be considered.    Test methodology:   Cepheid Xpert Xpress SARS-CoV-2/Flu/RSV Assay real-time polymerase chain reaction (RT-PCR) test on the GeneXpert Dx and Xpert Xpress systems.   TROPONIN-I - Normal    Narrative:     Values received on females ranging between 12-15 ng/L MUST include the next serial troponin to review changes  in the delta differences as the reference range for the Access II chemistry analyzer is lower than the established reference range.     URINALYSIS, MACRO/MICRO - Normal   MAGNESIUM  - Normal   TROPONIN-I - Normal    Narrative:     Values received on females ranging between 12-15 ng/L MUST include the next serial troponin to review changes in the delta differences as the reference range for the Access II chemistry analyzer is lower than the established reference range.     CBC/DIFF    Narrative:     The following orders were created for panel order CBC/DIFF.  Procedure                               Abnormality         Status                     ---------                               -----------         ------  CBC WITH DIFF[[id number]]                Abnormal            Final result                 Please view results for these tests on the individual orders.   URINALYSIS WITH REFLEX MICROSCOPIC AND CULTURE IF POSITIVE    Narrative:     The following orders were created for panel order URINALYSIS WITH REFLEX MICROSCOPIC AND CULTURE IF POSITIVE.  Procedure                               Abnormality         Status                     ---------                               -----------         ------                     URINALYSIS, MACRO/MICRO[[id number]]      Normal              Final result                 Please view results for these tests on the individual orders.   TROPONIN-I       XR CHEST PA AND LATERAL   Final Result by Edi, Radresults In (08/07 1248)      Chronic emphysematous changes. No acute findings.         Radiologist location ID: [id number]             Medical Decision Making          Medical Decision Making  Amount and/or Complexity of Data Reviewed  Labs: ordered.  Radiology: ordered.  ECG/medicine tests: ordered.    Risk  Prescription drug management.            Studies Assessed:  CBC, CMP, ABG, chest x-ray, troponin, magnesium     EKG:   This independent EKG interpretation by that  has noted below, this will be reviewed and documented separately by a cardiologist in the EMR.:    Rate:  59    Interpretation:  Sinus bradycardia rate 59 PR interval 114 milliseconds, QRS duration 84 milliseconds, QTC is 399 milliseconds.  Sinus bradycardia.    MDM Narrative:  73 year old female patient presents to emergency department after 1 week of progressive dyspnea, patient states he got much worse this morning bringing patient to emergency department via EMS providers.   On arrival patient is awake alert no intercostal or supraclavicular retractions noting no increased work of breathing.  Patient takes Trelegy inhaler at home, albuterol  nebulized q.i.d., patient states despite these medications the patient has had dyspnea over the past week.  Patient also takes furosemide  40 mg daily for congestive heart failure.  Patient does state she is compliant with her medications.  Chart review finds the patient has transthoracic echo with contrast in April of 2025, showing 70% ejection fraction, hyperdynamic left ventricular function.  DuoNeb plus additional albuterol  ordered on arrival at the emergency department.    Following handheld nebulizers and Solu-Medrol  patient states "I feel much better".  Repeat EKG does not find any changes.  First troponin was 12nd troponin was 13.    I discussed with patient and shared decision-making patient was to go home.  Also discussed changes in patient's hemoglobin from today versus 3 months ago in the need to follow up with primary care provider.  Patient may benefit from serum iron studies.    Patient's BNP is elevated today however chart review finds that patient's BNP has recently been as high as 5000.    Patient repeat ABG has improved.  Chest x-ray has no acute findings.  There is mentioned mild atelectasis left basilar lobe.  Augmentin  prescribed.                Differential includes but not limited to:  Acute exacerbation Chronic Obstructive Pulmonary  Disease.  Pneumonia  Pleurisy  Bronchitis        Follow-Up Discussion:     Please see documentation above for specific labs and radiology.    Decision for and Complexity of Risk in the ED encounter:  I ordered prescription medications requiring authorization in the ED or at discharge.            Medications Ordered/Administered in the ED   ipratropium-albuterol  0.5 mg-3 mg(2.5 mg base)/3 mL Solution for Nebulization (3 mL Nebulization Given 02/08/24 1241)   albuterol  (PROVENTIL ) 2.5 mg / 3 mL (0.083%) neb solution (2.5 mg Nebulization Given 02/08/24 1241)   methylPREDNISolone  sod succ (SOLU-medrol ) 125 mg/2 mL injection (125 mg Intravenous Given 02/08/24 1257)       Following the history, physical exam, and ED workup, the patient was deemed stable and suitable for discharge. The patient/caregiver was advised to return to the ED for any new or worsening symptoms. Discharge medications, and follow-up instructions were discussed with the patient/caregiver in detail, who verbalizes understanding. The patient/caregiver is in agreement and is comfortable with the plan of care.    Disposition: Discharged         Current Discharge Medication List        START taking these medications.        Details   predniSONE  20 mg Tablet  Commonly known as: DELTASONE    20 mg, Oral, Daily  Qty: 5 Tablet  Refills: 0            CONTINUE these medications - NO CHANGES were made during your visit.        Details   albuterol  sulfate 2.5 mg /3 mL (0.083 %) nebulizer solution  Commonly known as: PROVENTIL    2.5 mg, DAILY PRN  Refills: 0     aspirin  81 mg Tablet, Chewable   81 mg, Daily  Refills: 0     atorvastatin  40 mg Tablet  Commonly known as: LIPITOR   40 mg, EVERY EVENING  Refills: 0     ergocalciferol (vitamin D2) 1,250 mcg (50,000 unit) Capsule  Commonly known as: DRISDOL   50,000 Units, EVERY 7 DAYS  Refills: 0     escitalopram  oxalate 20 mg Tablet  Commonly known as: LEXAPRO    20 mg, Daily  Refills: 0     furosemide  40 mg Tablet  Commonly  known as: LASIX    40 mg, Daily  Refills: 0     ipratropium-albuteroL  0.5 mg-3 mg(2.5 mg base)/3 mL nebulizer solution  Commonly known as: DUONEB   3 mL, Nebulization, 4 TIMES DAILY  Refills: 0     Levocetirizine 5 mg Tablet  Commonly known as: XYZAL   5 mg, EVERY EVENING  Refills: 0  metoprolol  succinate 25 mg Tablet Sustained Release 24 hr  Commonly known as: TOPROL -XL   25 mg, Daily  Refills: 0     omeprazole 20 mg Capsule, Delayed Release(E.C.)  Commonly known as: PRILOSEC   20 mg, Daily  Refills: 0     ondansetron  4 mg Tablet, Rapid Dissolve  Commonly known as: ZOFRAN  ODT   4 mg, Oral, EVERY 8 HOURS PRN  Qty: 12 Tablet  Refills: 0     OXYGEN-AIR DELIVERY SYSTEMS DEACT   3L NC at home  Refills: 0     polyethylene glycol 17 gram Powder in Packet  Commonly known as: MIRALAX    34 g, Oral, 2 TIMES DAILY  Refills: 0     potassium chloride  20 mEq Tab Sust.Rel. Particle/Crystal  Commonly known as: K-DUR   20 mEq, Daily  Refills: 0     sennosides-docusate sodium  8.6-50 mg Tablet  Commonly known as: SENOKOT-S   2 Tablets, Oral, 2 TIMES DAILY  Refills: 0     Trelegy Ellipta 100-62.5-25 mcg Disk with Device  Generic drug: fluticasone -umeclidin-vilanter   1 Inhalation, Daily  Refills: 0            Follow up:   Andra Pastor, MD  [address]  [place] [address]  [phone]    Schedule an appointment as soon as possible for a visit in 3 days  Follow up with primary care physician repeat CBC.               Risk as noted in the medical decision-making is in reference to potential morbidity/mortality of management based upon previously established billing guidelines.    Based upon the clinical setting, the likely diagnosis/impression include:    Clinical Impression   Congestive heart failure, unspecified HF chronicity, unspecified heart failure type (CMS HCC)   COPD with acute exacerbation (CMS HCC)   Chronic obstructive pulmonary disease, unspecified COPD type (CMS HCC)   Dyspnea, unspecified type (Primary)          Current Discharge Medication List        START taking these medications    Details   predniSONE  (DELTASONE ) 20 mg Oral Tablet Take 1 Tablet (20 mg total) by mouth Daily for 5 days Indications: worsening chronic obstructive pulmonary disease  Qty: 5 Tablet, Refills: 0                   This note was partially created using voice recognition software and is inherently subject to errors including those of syntax and sound alike  substitutions which may escape proof reading. In such instances, original meaning may be extrapolated by contextual derivation.

## 2024-02-08 NOTE — ED Nurses Note (Signed)

## 2024-02-08 NOTE — Discharge Instructions (Addendum)
 Returned to the emergency department if symptoms do not improve or become worse, if you develop a fever and/or chills.  Continued to take prescribed medications including albuterol  nebulizers every 4 hours.    Follow up with primary care physician next week to review today's ER visit and repeat CBC in possibility of adding iron studies.

## 2024-02-08 NOTE — ED Nurses Note (Signed)
 Patient resting in bed watching TV. Patient denies any needs or concerns at this time.

## 2024-02-08 NOTE — Ancillary Notes (Signed)
 ABG ON 3 L/M N.C. PT. MOUTH BREATHING WILL PLACE ON 3 L/M OXYMASK   Latest Reference Range & Units 02/08/24 12:29   %FIO2 (ARTERIAL) % 32   PH 7.35 - 7.45  7.47 (H)   PCO2 35 - 45 mm/Hg 55 (H)   PO2 83 - 108 mm/Hg 55 (L)   BICARBONATE 21.0 - 28.0 mmol/L 35.5 (H)   BASE EXCESS 0.0 - 3.0 mmol/L 13.8 (H)   PAO2/FIO2 RATIO  172   O2 SATURATION (ARTERIAL) 94.0 - 98.0 % 90.2   LACTATE <=1.9 mmol/L 1.4   (H): Data is abnormally high  (L): Data is abnormally low

## 2024-02-08 NOTE — Ancillary Notes (Signed)
 PT.S SPO2 94%-96% ON 3 L/M OXYMASK

## 2024-02-08 NOTE — Care Management Notes (Signed)
 CM CONSULTED FOR HH.  PT DC FROM THE [facility] TODAY.  CM CONTACTED DTR VIA CELL PHONE TO ADVISE OF ORDER RECEIVED.  DAUGHTER AGREEABLE WITH HAVING HH ARRANGED.SHE SAID HER MOM HAS HAD  HH BEFORE, BUT She DOES NOT RECALL WHICH AGENCY.  She VOICED NO PREFERENCE.  HH REFERRAL UPLOADED IN CAREPORT.

## 2024-02-08 NOTE — ED Nurses Note (Signed)
 Patient up to bedside commode with assistance. Patient placed back to bed after voiding and placed back on cardiac monitor. Patient denies any needs or concerns at this time. Call bell within reach.

## 2024-02-08 NOTE — ED Nurses Note (Signed)
 Patient to ED via EMS. Patient c/o SOB x1 week. Patient on 2L NC upon arrival. Patient denies chest pain. Patient was given breathing treatment in route to ED. Patient calm and cooperative. Family x1 at bedside. Patient placed on cardiac monitor. Call bell within reach. No other needs or concerns at this time.

## 2024-02-09 DIAGNOSIS — R9431 Abnormal electrocardiogram [ECG] [EKG]: Secondary | ICD-10-CM

## 2024-02-09 DIAGNOSIS — R001 Bradycardia, unspecified: Secondary | ICD-10-CM

## 2024-02-09 LAB — ECG 12 LEAD
Atrial Rate: 59 {beats}/min
Atrial Rate: 73 {beats}/min
Calculated P Axis: 19 degrees
Calculated P Axis: 27 degrees
Calculated R Axis: 39 degrees
Calculated R Axis: 22 degrees
Calculated T Axis: -74 degrees
Calculated T Axis: 62 degrees
PR Interval: 114 ms
PR Interval: 134 ms
QRS Duration: 84 ms
QRS Duration: 90 ms
QT Interval: 404 ms
QT Interval: 428 ms
QTC Calculation: 399 ms
QTC Calculation: 471 ms
Ventricular rate: 59 {beats}/min
Ventricular rate: 73 {beats}/min

## 2024-02-09 NOTE — H&P (Deleted)
 PULMONARY, [facility]  [address]  [place] [address]  Operated by [facility]     New Patient    Patient Name: Misty Johnson  Date: 02/12/2024  Department:  PULMONARY, [facility]  MRN: [id number]  DOB: May 15, 1951  Primary Care Provider:  Manus Endo, MD  Referring Provider:  No ref. provider found      Chief Complaint: No chief complaint on file.        History of Present Illness     Referred by ER provider since patient has had to be in ER several times lately.  Most recent was 02/08/24 for c/o shortness of breath with history of COPD and chronic respiratory failure with hypoxia and requires the use of Oxygen at 2 LPM continuous.  Hx CHF.    Chest x-ray on 02/08/24 showing hyperexpanded with chronic emphysematous changes. Chronic right basilar density, likley scarring.  Mild left basilar linear atelectasis. No acute findings.  ?Mediastinal mass on CT 2022.  Was it removed?     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     Enlarged heart     LVH (left ventricular hypertrophy)     Major depression      Past Surgical History  Past Surgical History:   Procedure Laterality Date    NO PAST SURGERIES           Medication List  Current Outpatient Medications   Medication Sig    albuterol  sulfate (PROVENTIL ) 2.5 mg /3 mL (0.083 %) Inhalation nebulizer solution Take 3 mL (2.5 mg total) by nebulization Once per day as needed for Wheezing    amoxicillin -pot clavulanate (AUGMENTIN ) 875-125 mg Oral Tablet Take 1 Tablet by mouth Twice daily for 7 days    aspirin  81 mg Oral Tablet, Chewable Chew 1 Tablet (81 mg total)  Daily    atorvastatin  (LIPITOR) 40 mg Oral Tablet Take 1 Tablet (40 mg total) by mouth Every evening    ergocalciferol, vitamin D2, (DRISDOL) 1,250 mcg (50,000 unit) Oral Capsule Take 1 Capsule (50,000 Units total) by mouth Every 7 days    escitalopram  oxalate (LEXAPRO ) 20 mg Oral Tablet Take 1 Tablet (20 mg total) by mouth Daily    fluticasone -umeclidin-vilanter (TRELEGY ELLIPTA) 100-62.5-25 mcg Inhalation Disk with Device Take 1 Inhalation by inhalation Daily    furosemide  (LASIX ) 40 mg Oral Tablet Take 1 Tablet (40 mg total) by mouth Daily    ipratropium-albuterol  0.5 mg-3 mg(2.5 mg base)/3 mL Solution for Nebulization Take 3 mL by nebulization Four times a day    Levocetirizine (XYZAL) 5 mg Oral Tablet Take 1 Tablet (5 mg total) by mouth Every evening    metoprolol  succinate (TOPROL -XL) 25 mg Oral Tablet Sustained Release 24 hr Take 1 Tablet (25 mg total) by mouth Daily    omeprazole (PRILOSEC) 20 mg Oral Capsule, Delayed Release(E.C.) Take 1 Capsule (20 mg total) by mouth Daily    ondansetron  (ZOFRAN  ODT) 4 mg Oral Tablet, Rapid Dissolve Take 1 Tablet (4 mg total)  by mouth Every 8 hours as needed for Nausea/Vomiting    OXYGEN-AIR DELIVERY SYSTEMS DEACT 3L NC at home    polyethylene glycol (MIRALAX ) 17 gram Oral Powder in Packet Take 2 Packets (34 g total) by mouth Twice daily    potassium chloride  (K-DUR) 20 mEq Oral Tab Sust.Rel. Particle/Crystal Take 1 Tablet (20 mEq total) by mouth Daily    predniSONE  (DELTASONE ) 20 mg Oral Tablet Take 1 Tablet (20 mg total) by mouth Daily for 5 days Indications: worsening chronic obstructive pulmonary disease    sennosides-docusate sodium  (SENOKOT-S) 8.6-50 mg Oral Tablet Take 2 Tablets by mouth Twice daily     Allergy List  Allergy History as of 02/09/24       HYDROCODONE         Noted Status Severity Type Reaction    12/17/21 1234 Milta Service, RN 12/17/21 Active Low  Nausea/ Vomiting                  Family History   Family Medical History:       Problem Relation (Age  of Onset)    No Known Problems Mother, Father            Social History  Social History     Socioeconomic History    Marital status: Widowed   Tobacco Use    Smoking status: Every Day     Current packs/day: 1.00     Types: Cigarettes    Smokeless tobacco: Never   Vaping Use    Vaping status: Never Used   Substance and Sexual Activity    Alcohol  use: Not Currently    Drug use: Never    Sexual activity: Not Currently     Social Determinants of Health     Social Connections: Low Risk  (10/29/2023)    Social Connections     SDOH Social Isolation: 5 or more times a week        Review of system  General:  Denies fever, chills, night sweats, loss of appetite.  Neurological:  Denies dizziness or seizures.  Ears, Nose, Throat: Denies ear pain, nasal/sinus congestion or pain, or sore throat.   Gastrointestinal:  Denies uncontrolled reflux, heartburn, nausea, or diarrhea.  Cardiovascular:  Denies chest pain or irregular heartbeats.  Respiratory: see HPI  Musculoskeletal:  Denies uncontrolled joint pain or restless legs.  Endocrine/Metabolic:  Denies weight gain or weight loss.  Mental Status/Psychiatric:  Denies uncontrolled anxiety or depression.  Integumentary:  Denies rash or new abnormal skin lesions.    Vital Signs  There were no vitals filed for this visit.           Physical Exam      No diagnosis found.   Assessment & Plan      No orders of the defined types were placed in this encounter.      Continue medications as prescribed/directed unless changed by provider.  Plan of care discussed with patient.    No follow-ups on file.. Patient was advised to come back earlier if any symptoms get worse.  Also advised to come back for results of any test done.  If unable to keep regular appointment, patient was advised to schedule another appointment as soon as possible.     The patient was given the opportunity to ask questions and those questions were answered to the patient's satisfaction. The patient was encouraged to call  with any additional questions or concerns. Discussed with the patient effects and side effects of  medications. Medication safety was discussed.  The patient was informed to contact the office within 7 business days if a message/lab results/referral/imaging results have not been conveyed to the patient.    Electronically signed by Ronal Kerns, FNP-BC   Pulmonary and Critical care    This note was created with assistance from [employer] via capture of conversational audio. Consent was obtained from the patient and all parties present prior to recording.      This note may have been partially generated using MModal Fluency Direct system, and there may be some incorrect words, spellings, and punctuation that were not noted in checking the note before saving.

## 2024-02-12 ENCOUNTER — Ambulatory Visit (HOSPITAL_COMMUNITY): Payer: Self-pay | Admitting: SLEEP MEDICINE

## 2024-02-14 ENCOUNTER — Observation Stay
Admission: EM | Admit: 2024-02-14 | Discharge: 2024-02-19 | Disposition: A | Discharge status: Home / Self Care | Payer: Medicare (Managed Care) | Source: Home / Self Care

## 2024-02-14 ENCOUNTER — Emergency Department (HOSPITAL_BASED_OUTPATIENT_CLINIC_OR_DEPARTMENT_OTHER): Payer: Medicare (Managed Care)

## 2024-02-14 ENCOUNTER — Encounter (HOSPITAL_BASED_OUTPATIENT_CLINIC_OR_DEPARTMENT_OTHER): Payer: Self-pay

## 2024-02-14 ENCOUNTER — Other Ambulatory Visit: Payer: Self-pay

## 2024-02-14 DIAGNOSIS — R0902 Hypoxemia: Secondary | ICD-10-CM

## 2024-02-14 DIAGNOSIS — F17210 Nicotine dependence, cigarettes, uncomplicated: Secondary | ICD-10-CM | POA: Insufficient documentation

## 2024-02-14 DIAGNOSIS — J441 Chronic obstructive pulmonary disease with (acute) exacerbation: Principal | ICD-10-CM | POA: Insufficient documentation

## 2024-02-14 DIAGNOSIS — J9859 Other diseases of mediastinum, not elsewhere classified: Secondary | ICD-10-CM

## 2024-02-14 DIAGNOSIS — J440 Chronic obstructive pulmonary disease with acute lower respiratory infection: Secondary | ICD-10-CM

## 2024-02-14 DIAGNOSIS — R441 Visual hallucinations: Secondary | ICD-10-CM | POA: Insufficient documentation

## 2024-02-14 DIAGNOSIS — Z515 Encounter for palliative care: Secondary | ICD-10-CM | POA: Insufficient documentation

## 2024-02-14 DIAGNOSIS — J449 Chronic obstructive pulmonary disease, unspecified: Secondary | ICD-10-CM | POA: Diagnosis present

## 2024-02-14 DIAGNOSIS — F329 Major depressive disorder, single episode, unspecified: Secondary | ICD-10-CM | POA: Insufficient documentation

## 2024-02-14 DIAGNOSIS — R001 Bradycardia, unspecified: Secondary | ICD-10-CM

## 2024-02-14 DIAGNOSIS — R9431 Abnormal electrocardiogram [ECG] [EKG]: Secondary | ICD-10-CM

## 2024-02-14 DIAGNOSIS — R0602 Shortness of breath: Secondary | ICD-10-CM

## 2024-02-14 DIAGNOSIS — J9811 Atelectasis: Secondary | ICD-10-CM

## 2024-02-14 LAB — CBC WITH DIFF
BASOPHIL #: 0.03 x10ˆ3/uL (ref 0.00–0.10)
BASOPHIL %: 0 % (ref 0–1)
EOSINOPHIL #: 0.08 x10ˆ3/uL (ref 0.00–0.50)
EOSINOPHIL %: 1 % (ref 1–7)
HCT: 31.1 % — ABNORMAL LOW (ref 31.2–41.9)
HGB: 9.8 g/dL — ABNORMAL LOW (ref 10.9–14.3)
LYMPHOCYTE #: 0.95 x10ˆ3/uL — ABNORMAL LOW (ref 1.10–3.10)
LYMPHOCYTE %: 13 % — ABNORMAL LOW (ref 16–46)
MCH: 26.4 pg (ref 24.7–32.8)
MCHC: 31.4 g/dL — ABNORMAL LOW (ref 32.3–35.6)
MCV: 84.2 fL (ref 75.5–95.3)
MONOCYTE #: 0.65 x10ˆ3/uL (ref 0.20–0.90)
MONOCYTE %: 9 % (ref 4–11)
MPV: 10.2 fL (ref 7.9–10.8)
NEUTROPHIL #: 5.59 x10ˆ3/uL (ref 1.90–8.20)
NEUTROPHIL %: 77 % (ref 43–77)
PLATELETS: 145 x10ˆ3/uL (ref 140–440)
RBC: 3.69 x10ˆ6/uL (ref 3.63–4.92)
RDW: 18.3 % — ABNORMAL HIGH (ref 12.3–17.7)
WBC: 7.3 x10ˆ3/uL (ref 3.8–11.8)

## 2024-02-14 LAB — PTT (PARTIAL THROMBOPLASTIN TIME): APTT: 29.1 s (ref 25.0–38.0)

## 2024-02-14 LAB — COMPREHENSIVE METABOLIC PANEL, NON-FASTING
ALBUMIN/GLOBULIN RATIO: 1.1 (ref 0.8–1.4)
ALBUMIN: 3.3 g/dL — ABNORMAL LOW (ref 3.4–5.0)
ALKALINE PHOSPHATASE: 61 U/L (ref 46–116)
ALT (SGPT): 6 U/L (ref <=78)
ANION GAP: 3 mmol/L — ABNORMAL LOW (ref 4–13)
AST (SGOT): 14 U/L — ABNORMAL LOW (ref 15–37)
BILIRUBIN TOTAL: 0.7 mg/dL (ref 0.2–1.0)
BUN/CREA RATIO: 19
BUN: 20 mg/dL — ABNORMAL HIGH (ref 7–18)
CALCIUM, CORRECTED: 9.5 mg/dL
CALCIUM: 8.9 mg/dL (ref 8.5–10.1)
CHLORIDE: 100 mmol/L (ref 98–107)
CO2 TOTAL: 36 mmol/L — ABNORMAL HIGH (ref 21–32)
CREATININE: 1.07 mg/dL — ABNORMAL HIGH (ref 0.55–1.02)
ESTIMATED GFR: 55 mL/min/1.73mˆ2 — ABNORMAL LOW (ref >59)
GLOBULIN: 3
GLUCOSE: 107 mg/dL — ABNORMAL HIGH (ref 74–106)
OSMOLALITY, CALCULATED: 281 mosm/kg (ref 270–290)
POTASSIUM: 4 mmol/L (ref 3.5–5.1)
PROTEIN TOTAL: 6.3 g/dL — ABNORMAL LOW (ref 6.4–8.2)
SODIUM: 139 mmol/L (ref 136–145)

## 2024-02-14 LAB — BLOOD GAS
%FIO2 (ARTERIAL): 36 %
BASE EXCESS (ARTERIAL): 9.7 mmol/L — ABNORMAL HIGH (ref 0.0–3.0)
BICARBONATE (ARTERIAL): 32.5 mmol/L — ABNORMAL HIGH (ref 21.0–28.0)
O2 SATURATION (ARTERIAL): 96.9 % (ref 94.0–98.0)
PAO2/FIO2 RATIO: 236
PCO2 (ARTERIAL): 51 mmHg — ABNORMAL HIGH (ref 35–45)
PH (ARTERIAL): 7.45 (ref 7.35–7.45)
PO2 (ARTERIAL): 85 mmHg (ref 83–108)

## 2024-02-14 LAB — ECG 12 LEAD
Atrial Rate: 56 {beats}/min
Calculated P Axis: 15 degrees
Calculated R Axis: 10 degrees
Calculated T Axis: 208 degrees
PR Interval: 102 ms
QRS Duration: 86 ms
QT Interval: 396 ms
QTC Calculation: 382 ms
Ventricular rate: 56 {beats}/min

## 2024-02-14 LAB — LACTIC ACID LEVEL W/ REFLEX FOR LEVEL >2.0: LACTIC ACID: 1.2 mmol/L (ref 0.4–2.0)

## 2024-02-14 LAB — PT/INR
INR: 1.1 (ref 0.84–1.10)
PROTHROMBIN TIME: 12.4 s (ref 9.8–12.7)

## 2024-02-14 LAB — MAGNESIUM: MAGNESIUM: 2 mg/dL (ref 1.8–2.4)

## 2024-02-14 LAB — TROPONIN-I
TROPONIN I: 14 ng/L (ref <15)
TROPONIN I: 14 ng/L (ref <15)
TROPONIN I: 13 ng/L (ref <15)

## 2024-02-14 LAB — D-DIMER: D-DIMER: 601 ng{FEU}/mL (ref 215–500)

## 2024-02-14 LAB — NT-PROBNP: NT-PROBNP: 2411 pg/mL — ABNORMAL HIGH (ref <=125)

## 2024-02-14 MED ORDER — METHYLPREDNISOLONE SOD SUCC 125 MG SOLUTION FOR INJECTION WRAPPER
125.0000 mg | INTRAVENOUS | Status: AC
Start: 2024-02-14 — End: 2024-02-14
  Administered 2024-02-14: 125 mg via INTRAVENOUS

## 2024-02-14 MED ORDER — SODIUM CHLORIDE 0.9 % (FLUSH) INJECTION SYRINGE
3.0000 mL | INJECTION | INTRAMUSCULAR | Status: DC | PRN
Start: 2024-02-14 — End: 2024-02-19

## 2024-02-14 MED ORDER — METHYLPREDNISOLONE SOD SUCC 125 MG SOLUTION FOR INJECTION WRAPPER
INTRAVENOUS | Status: AC
Start: 2024-02-14 — End: 2024-02-14
  Filled 2024-02-14: qty 2

## 2024-02-14 MED ORDER — SODIUM CHLORIDE 0.9 % (FLUSH) INJECTION SYRINGE
3.0000 mL | INJECTION | Freq: Three times a day (TID) | INTRAMUSCULAR | Status: DC
Start: 2024-02-14 — End: 2024-02-19
  Administered 2024-02-14: 3 mL
  Administered 2024-02-15 (×2): 0 mL
  Administered 2024-02-15: 3 mL
  Administered 2024-02-16: 0 mL
  Administered 2024-02-16 – 2024-02-17 (×3): 3 mL
  Administered 2024-02-17: 0 mL
  Administered 2024-02-17 – 2024-02-19 (×6): 3 mL

## 2024-02-14 MED ORDER — IPRATROPIUM 0.5 MG-ALBUTEROL 3 MG (2.5 MG BASE)/3 ML NEBULIZATION SOLN
INHALATION_SOLUTION | RESPIRATORY_TRACT | Status: AC
Start: 2024-02-14 — End: 2024-02-14
  Filled 2024-02-14: qty 3

## 2024-02-14 MED ORDER — BUDESONIDE 0.5 MG/2 ML SUSPENSION FOR NEBULIZATION
INHALATION_SUSPENSION | RESPIRATORY_TRACT | Status: AC
Start: 2024-02-14 — End: 2024-02-14
  Filled 2024-02-14: qty 4

## 2024-02-14 MED ORDER — PIPERACILLIN-TAZOBACTAM 4.5 GRAM INTRAVENOUS SOLUTION
INTRAVENOUS | Status: AC
Start: 2024-02-14 — End: 2024-02-14
  Filled 2024-02-14: qty 20

## 2024-02-14 MED ORDER — SODIUM CHLORIDE 0.9 % INTRAVENOUS SOLUTION
INTRAVENOUS | Status: AC
Start: 2024-02-14 — End: 2024-02-14
  Filled 2024-02-14: qty 100

## 2024-02-14 MED ORDER — BUDESONIDE 0.5 MG/2 ML SUSPENSION FOR NEBULIZATION
1.0000 mg | INHALATION_SUSPENSION | Freq: Two times a day (BID) | RESPIRATORY_TRACT | Status: DC
Start: 2024-02-14 — End: 2024-02-19
  Administered 2024-02-14 – 2024-02-15 (×2): 1 mg via RESPIRATORY_TRACT
  Administered 2024-02-15: 0.5 mg via RESPIRATORY_TRACT
  Administered 2024-02-16 – 2024-02-19 (×7): 1 mg via RESPIRATORY_TRACT
  Filled 2024-02-14 (×3): qty 4

## 2024-02-14 MED ORDER — IPRATROPIUM 0.5 MG-ALBUTEROL 3 MG (2.5 MG BASE)/3 ML NEBULIZATION SOLN
3.0000 mL | INHALATION_SOLUTION | RESPIRATORY_TRACT | Status: AC
Start: 2024-02-14 — End: 2024-02-14
  Administered 2024-02-14: 3 mL via RESPIRATORY_TRACT

## 2024-02-14 MED ORDER — IOPAMIDOL 370 MG IODINE/ML (76 %) INTRAVENOUS SOLUTION
100.0000 mL | INTRAVENOUS | Status: AC
Start: 2024-02-14 — End: 2024-02-14
  Administered 2024-02-14: 100 mL via INTRAVENOUS
  Filled 2024-02-14: qty 100

## 2024-02-14 MED ORDER — DOXYCYCLINE HYCLATE 100 MG INTRAVENOUS POWDER FOR SOLUTION
INTRAVENOUS | Status: AC
Start: 2024-02-14 — End: 2024-02-14
  Filled 2024-02-14: qty 10

## 2024-02-14 MED ORDER — SODIUM CHLORIDE 0.9 % INTRAVENOUS SOLUTION
100.0000 mg | INTRAVENOUS | Status: AC
Start: 2024-02-14 — End: 2024-02-14
  Administered 2024-02-14: 100 mg via INTRAVENOUS
  Administered 2024-02-14: 0 mg via INTRAVENOUS

## 2024-02-14 MED ORDER — LACTATED RINGERS IV BOLUS
1000.0000 mL | INJECTION | Status: AC
Start: 2024-02-14 — End: 2024-02-14
  Administered 2024-02-14: 1000 mL via INTRAVENOUS
  Administered 2024-02-14: 0 mL via INTRAVENOUS

## 2024-02-14 MED ORDER — SODIUM CHLORIDE 0.9 % INTRAVENOUS SOLUTION
4.5000 g | INTRAVENOUS | Status: AC
Start: 2024-02-14 — End: 2024-02-14
  Administered 2024-02-14: 4.5 g via INTRAVENOUS
  Administered 2024-02-14: 0 g via INTRAVENOUS

## 2024-02-14 NOTE — ED Nurses Note (Addendum)
 Pt up to [facility] with assistance,  increased SOB and 02 sat noted to drop to 79% with 02 in use. Increased once pt back in bed to 90's. 2L n.c.   female external catheter applied

## 2024-02-14 NOTE — ED Nurses Note (Signed)
Provider notified of hypotension. Orders received.

## 2024-02-14 NOTE — ED Provider Notes (Signed)
 [facility], [place] - Emergency Department  ED Primary Note  History of Present Illness   Misty Johnson is a 73 y.o. female who had concerns including Shortness of Breath. Pt states sob on 2 liters but has had to use 4 liters for a week has been on steroid and antibiotics wo relief.   Review of Systems   Constitutional: No fever, chills or weakness   Skin: No rash or diaphoresis  HENT: No headaches, or congestion  Eyes: No vision changes or photophobia   Cardio: No chest pain, palpitations or leg swelling   Respiratory: +cough, wheezing +sOB  GI:  No nausea, vomiting or stool changes  GU:  No dysuria, hematuria, or increased frequency  MSK: No muscle aches, joint or back pain  Neuro: No seizures, LOC, numbness, tingling, or focal weakness  Psychiatric: No depression, SI or substance abuse  All other systems reviewed and are negative.      Physical Exam   ED Triage Vitals [02/14/24 1828]   BP (Non-Invasive) (!) 103/44   Heart Rate 82   Respiratory Rate (!) 26   Temperature 36.4 C (97.5 F)   SpO2 (!) 58 %   Weight 73 kg (161 lb)   Height 1.676 m (5' 6)     Constitutional:  73 y.o. female who appears in no distress. Normal color, no cyanosis.   HENT:   Head: Normocephalic and atraumatic.   Mouth/Throat: Oropharynx is clear and moist.   Eyes: EOMI, PERRL   Neck: Trachea midline. Neck supple.  Cardiovascular: RRR, No murmurs, rubs or gallops. Intact distal pulses.  Pulmonary/Chest: BS equal bilaterally. No respiratory distress. + diffuse  wheezes, no rales or chest tenderness.   Abdominal: Bowel sounds present and normal. Abdomen soft, no tenderness, no rebound and no guarding.  Back: No midline spinal tenderness, no paraspinal tenderness, no CVA tenderness.           Musculoskeletal: No edema, tenderness or deformity.  Skin: warm and dry. No rash, erythema, pallor or cyanosis  Psychiatric: normal mood and affect. Behavior is normal.   Neurological: Patient keenly alert and responsive, easily  able to raise eyebrows, facial muscles/expressions symmetric, speaking in fluent sentences, moving all extremities equally and fully, normal gait  Patient Data   Labs Ordered/Reviewed   COMPREHENSIVE METABOLIC PANEL, NON-FASTING - Abnormal; Notable for the following components:       Result Value    CO2 TOTAL 36 (*)     ANION GAP 3 (*)     BUN 20 (*)     CREATININE 1.07 (*)     ESTIMATED GFR 55 (*)     ALBUMIN 3.3 (*)     GLUCOSE 107 (*)     AST (SGOT) 14 (*)     PROTEIN TOTAL 6.3 (*)     All other components within normal limits    Narrative:     Estimated Glomerular Filtration Rate (eGFR) is calculated using the CKD-EPI (2021) equation, intended for patients 96 years of age and older. If gender is not documented or unknown, there will be no eGFR calculation.  Out of range, check for dilution.   BLOOD GAS - Abnormal; Notable for the following components:    PCO2 (ARTERIAL) 51 (*)     BICARBONATE (ARTERIAL) 32.5 (*)     BASE EXCESS (ARTERIAL) 9.7 (*)     All other components within normal limits    Narrative:     A reference range for Oxygen Saturation is provided  but abnormal results will not flag in Epic.   D-DIMER - Abnormal; Notable for the following components:    D-DIMER 601 (*)     All other components within normal limits    Narrative:     D-Dimers are reported in FEU per ng/mL.    IF PATIENT IS EXHIBITING SYMPTOMS DVT/PE, THIS D-DIMER RESULT MAY INDICATE A NEED FOR FURTHER TESTING FOR THESE CONDITIONS. IF PATIENT IS SUSPECTED OF DIC AND SYMPTOMS WORSEN OR PERSIST, A REPEAT DIC WORKUP SHOULD BE CONSIDERED.    NOTE: ALTHOUGH THE NORMAL RANGE FOR THIS TEST IS 215-500 ng/mL FEU, LITERATURE RECOMMENDS FURTHER TESTING FOR ANY RESULT >500ng/mL FEU.    A cut off value of 500ng/mL FEU or below can be used as an aid in the diagnosis of Thromboembolism when used in conjunction with the patient's medical history, clinical presentation and other findings. Results of 500ng/mL FEU or below have a negative predictive  value of 100%.     CBC WITH DIFF - Abnormal; Notable for the following components:    HGB 9.8 (*)     HCT 31.1 (*)     MCHC 31.4 (*)     RDW 18.3 (*)     LYMPHOCYTE % 13 (*)     LYMPHOCYTE # 0.95 (*)     All other components within normal limits   NT-PROBNP - Abnormal; Notable for the following components:    NT-PROBNP 2,411 (*)     All other components within normal limits   PT/INR - Normal    Narrative:     In the setting of warfarin therapy, a moderate-intensity INR goal range is 2.0 to 3.0 and a high-intensity INR goal range is 2.5 to 3.5.    INR is ONLY validated to determine the level of anticoagulation with vitamin K antagonists (warfarin). Other factors may elevate the INR including but not limited to direct oral anticoagulants (DOACs), liver dysfunction, vitamin K deficiency, DIC, factor deficiencies, and factor inhibitors.   PTT (PARTIAL THROMBOPLASTIN TIME) - Normal   TROPONIN-I - Normal    Narrative:     Values received on females ranging between 12-15 ng/L MUST include the next serial troponin to review changes in the delta differences as the reference range for the Access II chemistry analyzer is lower than the established reference range.     MAGNESIUM  - Normal   LACTIC ACID LEVEL W/ REFLEX FOR LEVEL >2.0 - Normal   TROPONIN-I - Normal    Narrative:     Values received on females ranging between 12-15 ng/L MUST include the next serial troponin to review changes in the delta differences as the reference range for the Access II chemistry analyzer is lower than the established reference range.     TROPONIN-I - Normal    Narrative:     Values received on females ranging between 12-15 ng/L MUST include the next serial troponin to review changes in the delta differences as the reference range for the Access II chemistry analyzer is lower than the established reference range.     ADULT ROUTINE BLOOD CULTURE, SET OF 2 BOTTLES (BACTERIA AND YEAST)   ADULT ROUTINE BLOOD CULTURE, SET OF 2 BOTTLES (BACTERIA AND  YEAST)   CBC/DIFF    Narrative:     The following orders were created for panel order CBC/DIFF.  Procedure  Abnormality         Status                     ---------                               -----------         ------                     CBC WITH IPQQ[[id number]]                Abnormal            Final result                 Please view results for these tests on the individual orders.     CT ANGIO CHEST W IV CONTRAST   Final Result by Edi, Radresults In (08/13 2014)   1. NO PULMONARY EMBOLUS   2. BILATERAL BRONCHIAL WALL THICKENING   3. BILATERAL ATELECTASIS.               Radiologist location ID: [id number]         XR AP MOBILE CHEST   Final Result by Edi, Radresults In (08/13 1845)   NO ACUTE FINDINGS.            Radiologist location ID: [id number]           Medical Decision Making   Diff dx of COPD exac hypoxia renal failure pe. Pneumonia.   Improved air exchange. O2 titrated down to 2.5 liters. Tolerated well. Plan to admit COPD exac failure out pt fx. 2356 care left to dr elder. Dr. Ole has had case presented to him.   Medications Ordered/Administered in the ED   budesonide  (PULMICORT  RESPULES) 0.5 mg/2 mL nebulizer suspension (1 mg Nebulization Given 02/14/24 1851)   NS flush syringe (has no administration in time range)   NS flush syringe (has no administration in time range)   ipratropium-albuterol  0.5 mg-3 mg(2.5 mg base)/3 mL Solution for Nebulization (3 mL Nebulization Given 02/14/24 1852)   methylPREDNISolone  sod succ (SOLU-medrol ) 125 mg/2 mL injection (125 mg Intravenous Given 02/14/24 1907)   doxycycline  hyclate 100 mg in NS 100 mL IVPB with adaptor (100 mg Intravenous New Bag/New Syringe 02/14/24 1950)   piperacillin -tazobactam (ZOSYN ) 4.5 g in NS 100 mL IVPB with adaptor (0 g Intravenous Stopped 02/14/24 1940)   iopamidol  (ISOVUE -370) 76% infusion (100 mL Intravenous Given 02/14/24 2003)     Clinical Impression   COPD exacerbation (CMS HCC) (Primary)   Hypoxia        Disposition: Admitted

## 2024-02-14 NOTE — Respiratory Therapy (Signed)
 Abg results shared with A. Cindie, FNP

## 2024-02-14 NOTE — Respiratory Therapy (Signed)
 Latest Reference Range & Units 02/14/24 18:40   %FIO2 (ARTERIAL) % 36   PH 7.35 - 7.45  7.45   PCO2 35 - 45 mm/Hg 51 (H)   PO2 83 - 108 mm/Hg 85   BICARBONATE 21.0 - 28.0 mmol/L 32.5 (H)   BASE EXCESS 0.0 - 3.0 mmol/L 9.7 (H)   PAO2/FIO2 RATIO  236   O2 SATURATION (ARTERIAL) 94.0 - 98.0 % 96.9   (H): Data is abnormally high      Patient given hhn treatment and fio2 decreased to 3lpm and 10 minutes later decreased to 2.5. she is ordered 2lpm at home.

## 2024-02-14 NOTE — ED Nurses Note (Signed)
 Pt to er 7 for triage, pt placed on home O2 4 lit nc at this time and taken off portable O2 tank, O2 increased to 91% with O2 in use

## 2024-02-14 NOTE — Care Management Notes (Signed)
 Referral Information  ++++++ Placed Provider #1 ++++++  Case Manager: Bing Plume  Provider Type: Home Health  Provider Name: [facility]  Address:  [address]  [place], [place] [address]  Contact:  Admissions Administrator  Phone: [phone] x  Fax:   Fax: [phone]

## 2024-02-15 ENCOUNTER — Encounter (HOSPITAL_COMMUNITY): Payer: Self-pay

## 2024-02-15 DIAGNOSIS — Z79899 Other long term (current) drug therapy: Secondary | ICD-10-CM

## 2024-02-15 DIAGNOSIS — F17210 Nicotine dependence, cigarettes, uncomplicated: Secondary | ICD-10-CM

## 2024-02-15 DIAGNOSIS — J441 Chronic obstructive pulmonary disease with (acute) exacerbation: Principal | ICD-10-CM

## 2024-02-15 DIAGNOSIS — J449 Chronic obstructive pulmonary disease, unspecified: Secondary | ICD-10-CM | POA: Diagnosis present

## 2024-02-15 DIAGNOSIS — J9611 Chronic respiratory failure with hypoxia: Secondary | ICD-10-CM

## 2024-02-15 DIAGNOSIS — F329 Major depressive disorder, single episode, unspecified: Secondary | ICD-10-CM

## 2024-02-15 MED ORDER — ESCITALOPRAM 20 MG TABLET
20.0000 mg | ORAL_TABLET | Freq: Every day | ORAL | Status: DC
Start: 2024-02-15 — End: 2024-02-19
  Administered 2024-02-15 – 2024-02-19 (×5): 20 mg via ORAL
  Filled 2024-02-15 (×5): qty 1

## 2024-02-15 MED ORDER — ATORVASTATIN 40 MG TABLET
40.0000 mg | ORAL_TABLET | Freq: Every evening | ORAL | Status: DC
Start: 2024-02-15 — End: 2024-02-19
  Administered 2024-02-15 – 2024-02-18 (×4): 40 mg via ORAL
  Filled 2024-02-15 (×4): qty 1

## 2024-02-15 MED ORDER — SODIUM CHLORIDE 0.9 % INTRAVENOUS SOLUTION
4.5000 g | Freq: Three times a day (TID) | INTRAVENOUS | Status: DC
Start: 2024-02-15 — End: 2024-02-15
  Administered 2024-02-15: 0 g via INTRAVENOUS
  Administered 2024-02-15: 4.5 g via INTRAVENOUS
  Administered 2024-02-15: 0 g via INTRAVENOUS
  Administered 2024-02-15: 4.5 g via INTRAVENOUS
  Filled 2024-02-15 (×2): qty 20

## 2024-02-15 MED ORDER — POTASSIUM CHLORIDE ER 20 MEQ TABLET,EXTENDED RELEASE(PART/CRYST)
20.0000 meq | ORAL_TABLET | Freq: Every day | ORAL | Status: DC
Start: 2024-02-15 — End: 2024-02-19
  Administered 2024-02-15 – 2024-02-19 (×5): 20 meq via ORAL
  Filled 2024-02-15 (×5): qty 1

## 2024-02-15 MED ORDER — SENNOSIDES 8.6 MG-DOCUSATE SODIUM 50 MG TABLET
2.0000 | ORAL_TABLET | Freq: Two times a day (BID) | ORAL | Status: DC
Start: 2024-02-15 — End: 2024-02-19
  Administered 2024-02-15 – 2024-02-16 (×3): 2 via ORAL
  Administered 2024-02-16 – 2024-02-17 (×2): 0 via ORAL
  Administered 2024-02-17 – 2024-02-19 (×4): 2 via ORAL
  Filled 2024-02-15 (×7): qty 2

## 2024-02-15 MED ORDER — ASPIRIN 81 MG CHEWABLE TABLET
81.0000 mg | CHEWABLE_TABLET | Freq: Every day | ORAL | Status: DC
Start: 2024-02-15 — End: 2024-02-19
  Administered 2024-02-15 – 2024-02-19 (×5): 81 mg via ORAL
  Filled 2024-02-15 (×5): qty 1

## 2024-02-15 MED ORDER — FLUTICASONE FUR. 100 MCG-UMECLID 62.5 MCG-VILANT 25 MCG INHALAT.POWDER
1.0000 | DISK | Freq: Every day | RESPIRATORY_TRACT | Status: DC
Start: 2024-02-15 — End: 2024-02-15

## 2024-02-15 MED ORDER — METHYLPREDNISOLONE SOD SUCCINATE 40 MG/ML SOLUTION FOR INJ. WRAPPER
40.0000 mg | Freq: Three times a day (TID) | INTRAMUSCULAR | Status: DC
Start: 2024-02-15 — End: 2024-02-16
  Administered 2024-02-15 – 2024-02-16 (×4): 40 mg via INTRAVENOUS
  Filled 2024-02-15 (×5): qty 1

## 2024-02-15 MED ORDER — IPRATROPIUM 0.5 MG-ALBUTEROL 3 MG (2.5 MG BASE)/3 ML NEBULIZATION SOLN
3.0000 mL | INHALATION_SOLUTION | RESPIRATORY_TRACT | Status: DC | PRN
Start: 2024-02-15 — End: 2024-02-16

## 2024-02-15 MED ORDER — SODIUM CHLORIDE 0.9 % INTRAVENOUS SOLUTION
100.0000 mg | Freq: Two times a day (BID) | INTRAVENOUS | Status: DC
Start: 2024-02-15 — End: 2024-02-16
  Administered 2024-02-15 (×2): 100 mg via INTRAVENOUS
  Administered 2024-02-15 (×2): 0 mg via INTRAVENOUS
  Filled 2024-02-15 (×4): qty 10

## 2024-02-15 MED ORDER — GUAIFENESIN 100 MG/5 ML ORAL LIQUID
200.0000 mg | ORAL | Status: DC | PRN
Start: 2024-02-15 — End: 2024-02-19
  Administered 2024-02-16: 200 mg via ORAL
  Filled 2024-02-15: qty 10

## 2024-02-15 MED ORDER — FUROSEMIDE 40 MG TABLET
40.0000 mg | ORAL_TABLET | Freq: Every day | ORAL | Status: DC
Start: 2024-02-15 — End: 2024-02-19
  Administered 2024-02-15 – 2024-02-19 (×5): 40 mg via ORAL
  Filled 2024-02-15 (×6): qty 1

## 2024-02-15 MED ORDER — LORATADINE 10 MG TABLET
10.0000 mg | ORAL_TABLET | Freq: Every day | ORAL | Status: DC
Start: 2024-02-15 — End: 2024-02-19
  Administered 2024-02-15 – 2024-02-19 (×5): 10 mg via ORAL
  Filled 2024-02-15 (×5): qty 1

## 2024-02-15 MED ORDER — POLYETHYLENE GLYCOL 3350 17 GRAM ORAL POWDER PACKET
34.0000 g | Freq: Two times a day (BID) | ORAL | Status: DC
Start: 2024-02-15 — End: 2024-02-19
  Administered 2024-02-15: 0 g via ORAL
  Administered 2024-02-15 – 2024-02-16 (×2): 34 g via ORAL
  Administered 2024-02-16 – 2024-02-19 (×6): 0 g via ORAL
  Filled 2024-02-15 (×5): qty 2

## 2024-02-15 MED ORDER — IPRATROPIUM 0.5 MG-ALBUTEROL 3 MG (2.5 MG BASE)/3 ML NEBULIZATION SOLN
3.0000 mL | INHALATION_SOLUTION | Freq: Four times a day (QID) | RESPIRATORY_TRACT | Status: AC
Start: 2024-02-15 — End: 2024-02-16
  Administered 2024-02-15: 0 mL via RESPIRATORY_TRACT
  Administered 2024-02-15 (×3): 3 mL via RESPIRATORY_TRACT
  Filled 2024-02-15 (×2): qty 3

## 2024-02-15 MED ORDER — PANTOPRAZOLE 40 MG TABLET,DELAYED RELEASE
40.0000 mg | DELAYED_RELEASE_TABLET | Freq: Every day | ORAL | Status: DC
Start: 2024-02-15 — End: 2024-02-19
  Administered 2024-02-15 – 2024-02-19 (×5): 40 mg via ORAL
  Filled 2024-02-15 (×5): qty 1

## 2024-02-15 MED ORDER — ENOXAPARIN 40 MG/0.4 ML SUBCUTANEOUS SYRINGE
40.0000 mg | INJECTION | Freq: Every day | SUBCUTANEOUS | Status: DC
Start: 2024-02-15 — End: 2024-02-19
  Administered 2024-02-15 – 2024-02-19 (×5): 40 mg via SUBCUTANEOUS
  Filled 2024-02-15 (×5): qty 0.4

## 2024-02-15 MED ORDER — METOPROLOL SUCCINATE ER 25 MG TABLET,EXTENDED RELEASE 24 HR
25.0000 mg | ORAL_TABLET | Freq: Every day | ORAL | Status: DC
Start: 2024-02-15 — End: 2024-02-17
  Administered 2024-02-15: 0 mg via ORAL
  Administered 2024-02-16: 25 mg via ORAL
  Administered 2024-02-17: 0 mg via ORAL
  Filled 2024-02-15 (×3): qty 1

## 2024-02-15 NOTE — H&P (Signed)
 [facility] [place] [facility]  History and Physical    Date of Service:  02/15/2024  Misty Johnson, Misty Johnson, 73 y.o. female  Encounter Start Date:  02/14/2024  Inpatient Admission Date: 02/15/2024  Date of Birth:  08-22-1950  PCP: Manus Endo, MD       Chief Complaint:  Increased SOB    HPI: Misty Johnson is a 73 y.o., White female who presents to [facility] [place] [facility] from [facility] with complaints of increased shortness of breath.  Patient was seen and examined at bedside.  Patient was started on p.o. antibiotics as well as p.o. steroids earlier this week.  Patient states that the antibiotics started given her diarrhea so she decided to quit taking him.  Patient states she was not getting better so she came to the ED to be evaluated.  Patient was started on IV antibiotics in the ED. patient states that her breathing is doing better at this time.  Patient states that she was wheezing and very short of breath prior to arrival to the ED. patient states she normally wears O2 at 3 L via NC at home.  Patient is still currently at that at this time.  Patient denies any other complaints at this time.  Patient will be admitted to hospitalist services for further evaluation.    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     Enlarged heart     LVH (left ventricular hypertrophy)     Major depression              Medications Prior to Admission       Prescriptions    albuterol  sulfate (PROVENTIL ) 2.5 mg /3 mL (0.083 %) Inhalation nebulizer solution    Take 3 mL (2.5 mg total) by nebulization Once per day as needed for Wheezing    amoxicillin -pot clavulanate (AUGMENTIN ) 875-125 mg Oral Tablet    Take 1 Tablet by mouth Twice  daily for 7 days    aspirin  81 mg Oral Tablet, Chewable    Chew 1 Tablet (81 mg total) Daily    atorvastatin  (LIPITOR) 40 mg Oral Tablet    Take 1 Tablet (40 mg total) by mouth Every evening    ergocalciferol, vitamin D2, (DRISDOL) 1,250 mcg (50,000 unit) Oral Capsule    Take 1 Capsule (50,000 Units total) by mouth Every 7 days    escitalopram  oxalate (LEXAPRO ) 20 mg Oral Tablet    Take 1 Tablet (20 mg total) by mouth Daily    fluticasone -umeclidin-vilanter (TRELEGY ELLIPTA) 100-62.5-25 mcg Inhalation Disk with Device    Take 1 Inhalation by inhalation Daily    furosemide  (LASIX ) 40 mg Oral Tablet    Take 1 Tablet (40 mg total) by mouth Daily    ipratropium-albuterol  0.5 mg-3 mg(2.5 mg base)/3 mL Solution for Nebulization    Take 3 mL by nebulization Four times a day    Levocetirizine (XYZAL) 5 mg Oral Tablet    Take 1 Tablet (5 mg total) by mouth Every evening    metoprolol  succinate (TOPROL -XL) 25 mg Oral Tablet Sustained Release 24 hr    Take  1 Tablet (25 mg total) by mouth Daily    omeprazole (PRILOSEC) 20 mg Oral Capsule, Delayed Release(E.C.)    Take 1 Capsule (20 mg total) by mouth Daily    ondansetron  (ZOFRAN  ODT) 4 mg Oral Tablet, Rapid Dissolve    Take 1 Tablet (4 mg total) by mouth Every 8 hours as needed for Nausea/Vomiting    OXYGEN-AIR DELIVERY SYSTEMS DEACT    3L NC at home    polyethylene glycol (MIRALAX ) 17 gram Oral Powder in Packet    Take 2 Packets (34 g total) by mouth Twice daily    potassium chloride  (K-DUR) 20 mEq Oral Tab Sust.Rel. Particle/Crystal    Take 1 Tablet (20 mEq total) by mouth Daily    predniSONE  (DELTASONE ) 20 mg Oral Tablet    Take 1 Tablet (20 mg total) by mouth Daily for 5 days Indications: worsening chronic obstructive pulmonary disease    sennosides-docusate sodium  (SENOKOT-S) 8.6-50 mg Oral Tablet    Take 2 Tablets by mouth Twice daily          Allergies[1]    Past Surgical History:  Past Surgical History:   Procedure Laterality Date    NO PAST SURGERIES             Family  History:  Family Medical History:       Problem Relation (Age of Onset)    No Known Problems Mother, Father               Social History:  Social History[2]     Review of Systems:  All systems are reviewed and are negative except those mentioned in the HPI portion    Examination:  BP (!) 137/56   Pulse 63   Temp 36.4 C (97.5 F)   Resp (!) 22   Ht 1.676 m (5' 6)   Wt 73 kg (161 lb)   SpO2 94%   BMI 25.99 kg/m         General: Patient is alert and oriented to person, place and time.    HEENT: Pupils are of round shape, equal in size, and reactive to light bilaterally. Oral mucous membranes are moist.    Heart: S1 and S2 are present. No appreciable murmur.    Lungs: Breath sounds are diminished at all posterior lung fields, faint wheezing scattered rhonchi.    Gastrointestinal: Bowel sounds are appreciated at all 4 quadrants. Abdomen is soft, not appreciably distended, non-tender to palpation at all quadrants.    Extremities: Radial pulses are 3/4 bilaterally, dorsalis pedis pulses are 3/4 bilaterally. Capillary refill is less than 3 seconds at distal digits bilaterally. No appreciable edema of the lower extremities.    Genitourinary: No appreciable suprapubic tenderness.    Neurologic: Follows commands appropriately. No appreciable facial droop. No appreciable focal weakness of the bilateral upper or lower extremities.    Skin: Grossly intact at observable areas.    Labs:    Lab Results Today:    Results for orders placed or performed during the hospital encounter of 02/14/24 (from the past 24 hours)   ECG 12 LEAD   Result Value Ref Range    Ventricular rate 56 BPM    Atrial Rate 56 BPM    PR Interval 102 ms    QRS Duration 86 ms    QT Interval 396 ms    QTC Calculation 382 ms    Calculated P Axis 15 degrees    Calculated R Axis 10 degrees    Calculated  T Axis 208 degrees   BLOOD GAS Arterial   Result Value Ref Range    %FIO2 (ARTERIAL) 36 %    PH (ARTERIAL) 7.45 7.35 - 7.45    PCO2 (ARTERIAL) 51 (H) 35 -  45 mm/Hg    PO2 (ARTERIAL) 85 83 - 108 mm/Hg    BICARBONATE (ARTERIAL) 32.5 (H) 21.0 - 28.0 mmol/L    BASE EXCESS (ARTERIAL) 9.7 (H) 0.0 - 3.0 mmol/L    PAO2/FIO2 RATIO 236     O2 SATURATION (ARTERIAL) 96.9 94.0 - 98.0 %   PT/INR   Result Value Ref Range    PROTHROMBIN TIME 12.4 9.8 - 12.7 seconds    INR 1.10 0.84 - 1.10   PTT (PARTIAL THROMBOPLASTIN TIME)   Result Value Ref Range    APTT 29.1 25.0 - 38.0 seconds   D-DIMER   Result Value Ref Range    D-DIMER 601 (HH) 215 - 500 ng/mL FEU   COMPREHENSIVE METABOLIC PANEL, NON-FASTING   Result Value Ref Range    SODIUM 139 136 - 145 mmol/L    POTASSIUM 4.0 3.5 - 5.1 mmol/L    CHLORIDE 100 98 - 107 mmol/L    CO2 TOTAL 36 (H) 21 - 32 mmol/L    ANION GAP 3 (L) 4 - 13 mmol/L    BUN 20 (H) 7 - 18 mg/dL    CREATININE 8.92 (H) 0.55 - 1.02 mg/dL    BUN/CREA RATIO 19     ESTIMATED GFR 55 (L) >59 mL/min/1.16m^2    ALBUMIN 3.3 (L) 3.4 - 5.0 g/dL    CALCIUM  8.9 8.5 - 10.1 mg/dL    GLUCOSE 892 (H) 74 - 106 mg/dL    ALKALINE PHOSPHATASE 61 46 - 116 U/L    ALT (SGPT) 6 <=78 U/L    AST (SGOT) 14 (L) 15 - 37 U/L    BILIRUBIN TOTAL 0.7 0.2 - 1.0 mg/dL    PROTEIN TOTAL 6.3 (L) 6.4 - 8.2 g/dL    ALBUMIN/GLOBULIN RATIO 1.1 0.8 - 1.4    OSMOLALITY, CALCULATED 281 270 - 290 mOsm/kg    CALCIUM , CORRECTED 9.5 mg/dL    GLOBULIN 3.0    TROPONIN-I NOW   Result Value Ref Range    TROPONIN I 14 <15 ng/L   MAGNESIUM    Result Value Ref Range    MAGNESIUM  2.0 1.8 - 2.4 mg/dL   CBC WITH DIFF   Result Value Ref Range    WBC 7.3 3.8 - 11.8 x10^3/uL    RBC 3.69 3.63 - 4.92 x10^6/uL    HGB 9.8 (L) 10.9 - 14.3 g/dL    HCT 68.8 (L) 68.7 - 41.9 %    MCV 84.2 75.5 - 95.3 fL    MCH 26.4 24.7 - 32.8 pg    MCHC 31.4 (L) 32.3 - 35.6 g/dL    RDW 81.6 (H) 87.6 - 17.7 %    PLATELETS 145 140 - 440 x10^3/uL    MPV 10.2 7.9 - 10.8 fL    NEUTROPHIL % 77 43 - 77 %    LYMPHOCYTE % 13 (L) 16 - 46 %    MONOCYTE % 9 4 - 11 %    EOSINOPHIL % 1 1 - 7 %    BASOPHIL % 0 0 - 1 %    NEUTROPHIL # 5.59 1.90 - 8.20 x10^3/uL     LYMPHOCYTE # 0.95 (L) 1.10 - 3.10 x10^3/uL    MONOCYTE # 0.65 0.20 - 0.90 x10^3/uL    EOSINOPHIL # 0.08  0.00 - 0.50 x10^3/uL    BASOPHIL # 0.03 0.00 - 0.10 x10^3/uL   LACTIC ACID LEVEL W/ REFLEX FOR LEVEL >2.0   Result Value Ref Range    LACTIC ACID 1.2 0.4 - 2.0 mmol/L   NT-PROBNP   Result Value Ref Range    NT-PROBNP 2,411 (H) <=125 pg/mL   TROPONIN-I IN ONE HOUR   Result Value Ref Range    TROPONIN I 14 <15 ng/L   TROPONIN-I IN THREE HOURS   Result Value Ref Range    TROPONIN I 13 <15 ng/L       Imaging Studies:  Results for orders placed or performed during the hospital encounter of 02/14/24 (from the past 24 hours)   XR AP MOBILE CHEST     Status: None    Narrative    Icelyn Schear    RADIOLOGIST: Lang Speaks    XR AP MOBILE CHEST performed on 02/14/2024 6:41 PM    CLINICAL HISTORY: sob  SOB    TECHNIQUE: Frontal view of the chest.    COMPARISON:  Chest radiograph dated 11/10/2023    FINDINGS:    The heart size is normal.   There are chronic-appearing changes of both lungs.           Impression    NO ACUTE FINDINGS.        Radiologist location ID: [id number]     CT ANGIO CHEST W IV CONTRAST     Status: None    Narrative    Nichelle Kidd    RADIOLOGIST: Oneil LITTIE Raw, MD    CT ANGIO CHEST W IV CONTRAST performed on 02/14/2024 8:02 PM    CLINICAL HISTORY: cp sob hypoxia  sob/hypoxia, hx of copd/chf, smoker many years    TECHNIQUE: CTA imaging of the chest with intravenous contrast.  3D reconstructions.  CONTRAST:  100 ml's of Isovue  370    COMPARISON: 05/17/2021         FINDINGS:  Hardware:  None.    Lymph nodes:   No mediastinal, hilar, or axillary lymphadenopathy.    Heart:  Cardiomegaly.  No pericardial effusion. Coronary artery calcifications.        RV/LV Diameter Ratio: N/A    Thoracic Aorta:  Contrast timing is designed to evaluate the pulmonary arteries.  The thoracic aorta has a normal contour.  Contrast opacification of the aorta is suboptimal for evaluation of the lumen.    Pulmonary Vessels:  No  evidence of acute pulmonary emboli through the major subsegmental branches.     Most Proximal Level Of Embolus (if embolus present): N/A    Lungs and Airways:  Emphysematous changes are present. Bilateral bronchial wall thickening. Atelectasis in both lower lobes and the right middle lobe.    Pleura: No pleural effusion.  No pneumothorax.    Upper Abdomen: Visualized portions of the upper abdominal viscera are unremarkable.    Bones: Degenerative changes in the thoracic spine. Mid to lower thoracic vertebral body fracture unchanged from 05/27/2021.        Impression    1. NO PULMONARY EMBOLUS  2. BILATERAL BRONCHIAL WALL THICKENING  3. BILATERAL ATELECTASIS.          Radiologist location ID: [id number]          Assessment/Plan:   Active Hospital Problems    Diagnosis    Primary Problem: Acute exacerbation of chronic obstructive pulmonary disease (COPD) (CMS HCC)     Acute exacerbation of COPD  -IV doxy and IV Zosyn   will be continued for patient   -IV Solu-Medrol  40 mg q.8h   -DuoNebs and p.o. Robitussin will be ordered for increased shortness of breath and cough  -incentive spirometry PRN  -supplemental O2 titration PRN  -we will resume home medications    Plan to admit patient MIP.  Telemetry, continuous pulse ox, and supplemental oxygen titration p.r.n. will be ordered.  Will verify patient's home medications.  Plan repeat labs in a.m.SABRA  See attending addendum and orders for further information.    DVT/PE Prophylaxis: SCDs/ Venodynes/Impulse boots and Heparin    Sakai Heinle, FNP-BC    Contents of the document, in whole or in part, are completed utilizing M*Modal dictation technology, please forgive any typographical errors that may exist.         [1]   Allergies  Allergen Reactions    Hydrocodone Nausea/ Vomiting   [2]   Social History  Tobacco Use    Smoking status: Every Day     Current packs/day: 1.00     Types: Cigarettes    Smokeless tobacco: Never   Vaping Use    Vaping status: Never Used   Substance Use  Topics    Alcohol  use: Not Currently    Drug use: Never

## 2024-02-15 NOTE — Consults (Signed)
 [facility]  Palliative Care Nurse Practitioner  Consult Note    Misty Johnson, Misty Johnson, 73 y.o. female  Date of Birth:  [birth year]  MRN: [id number]  Admit Date: 02/14/2024   Attending: Hospitalist  Manus Endo, MD   Reason for Consult: communication with patient/family, other symptom management   Requesting provider: Eleanor Borrow, PA-C    Chief Complaint:  Shortness of breath    HPI:  Misty Johnson is a 73 y.o. female who presented to the ED with complaints of increased shortness of breath.  Pt had been started on antibiotics and po steroids earlier this week but that the antibiotics had given her diarrhea so she quit taking them.  Chest x-ray no acute findings. CT angio chest no PE, bilateral bronchial wall thickening, bilateral atelectasis.  Pt was admitted with COPD exacerbation.  She is being treated with iv antibiotics, iv steroids, nebulizers, oxygen.        Subjective:  PT lying in bed.  She states her breathing is getting better and she feels better today than yesterday.  Currently on 2.5L oxygen.    Review of Systems:  ROS: Other than ROS in the HPI, all other systems were negative.    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     Enlarged heart     LVH (left ventricular hypertrophy)     Major depression          Past Surgical History:   Procedure Laterality Date    NO PAST SURGERIES            Family Medical History:       Problem Relation (Age of Onset)    No Known Problems Mother, Father            Social History     Socioeconomic History    Marital status: Widowed   Tobacco Use    Smoking status: Every Day     Current packs/day: 1.00     Types: Cigarettes    Smokeless tobacco: Never    Vaping Use    Vaping status: Never Used   Substance and Sexual Activity    Alcohol  use: Not Currently    Drug use: Never    Sexual activity: Not Currently     Social Determinants of Health     Social Connections: Low Risk  (02/15/2024)    Social Connections     SDOH Social Isolation: 5 or more times a week       Current Outpatient Medications   Medication Instructions    albuterol  sulfate (PROVENTIL ) 2.5 mg, DAILY PRN    amoxicillin -pot clavulanate (AUGMENTIN ) 875-125 mg Oral Tablet 1 Tablet, Oral, 2 TIMES DAILY    aspirin  81 mg, Daily    atorvastatin  (LIPITOR) 40 mg, EVERY EVENING    ergocalciferol (vitamin D2) (DRISDOL) 50,000 Units, EVERY 7 DAYS    escitalopram  oxalate (LEXAPRO ) 20 mg, Daily    fluticasone -umeclidin-vilanter (TRELEGY ELLIPTA) 100-62.5-25 mcg Inhalation Disk with Device 1 Inhalation, Daily    furosemide  (LASIX ) 40 mg, Daily    ipratropium-albuterol  0.5 mg-3 mg(2.5 mg base)/3 mL Solution for Nebulization  3 mL, Nebulization, 4 TIMES DAILY    Levocetirizine (XYZAL) 5 mg, EVERY EVENING    metoprolol  succinate (TOPROL -XL) 25 mg, Daily    omeprazole (PRILOSEC) 20 mg, Daily    ondansetron  (ZOFRAN  ODT) 4 mg, Oral, EVERY 8 HOURS PRN    OXYGEN-AIR DELIVERY SYSTEMS DEACT 3L NC at home    polyethylene glycol (MIRALAX ) 34 g, Oral, 2 TIMES DAILY    potassium chloride  (K-DUR) 20 mEq Oral Tab Sust.Rel. Particle/Crystal 20 mEq, Daily    sennosides-docusate sodium  (SENOKOT-S) 8.6-50 mg Oral Tablet 2 Tablets, Oral, 2 TIMES DAILY      Allergies   Allergen Reactions    Hydrocodone Nausea/ Vomiting         Physical Exam:  Constitutional: no distress  Respiratory: wheezes bilaterally  Cardiovascular: S1, S2 normal  Gastrointestinal: non-distended, Soft, non-tender, Bowel sounds normal  Extremities: no edema  Integumentary:  Skin warm and dry  Neurologic: Alert and oriented x3    BP (!) 138/49   Pulse 61   Temp 36.6 C (97.9 F)   Resp (!) 21   Ht 1.626 m (5' 4)   Wt 78.5 kg (173 lb 1.6 oz)   SpO2 99%   BMI  29.71 kg/m      Pain: Numeric 0     Labs:  Lab Results Today:    Results for orders placed or performed during the hospital encounter of 02/14/24 (from the past 24 hours)   ECG 12 LEAD   Result Value Ref Range    Ventricular rate 56 BPM    Atrial Rate 56 BPM    PR Interval 102 ms    QRS Duration 86 ms    QT Interval 396 ms    QTC Calculation 382 ms    Calculated P Axis 15 degrees    Calculated R Axis 10 degrees    Calculated T Axis 208 degrees   BLOOD GAS Arterial   Result Value Ref Range    %FIO2 (ARTERIAL) 36 %    PH (ARTERIAL) 7.45 7.35 - 7.45    PCO2 (ARTERIAL) 51 (H) 35 - 45 mm/Hg    PO2 (ARTERIAL) 85 83 - 108 mm/Hg    BICARBONATE (ARTERIAL) 32.5 (H) 21.0 - 28.0 mmol/L    BASE EXCESS (ARTERIAL) 9.7 (H) 0.0 - 3.0 mmol/L    PAO2/FIO2 RATIO 236     O2 SATURATION (ARTERIAL) 96.9 94.0 - 98.0 %   PT/INR   Result Value Ref Range    PROTHROMBIN TIME 12.4 9.8 - 12.7 seconds    INR 1.10 0.84 - 1.10   PTT (PARTIAL THROMBOPLASTIN TIME)   Result Value Ref Range    APTT 29.1 25.0 - 38.0 seconds   D-DIMER   Result Value Ref Range    D-DIMER 601 (HH) 215 - 500 ng/mL FEU   COMPREHENSIVE METABOLIC PANEL, NON-FASTING   Result Value Ref Range    SODIUM 139 136 - 145 mmol/L    POTASSIUM 4.0 3.5 - 5.1 mmol/L    CHLORIDE 100 98 - 107 mmol/L    CO2 TOTAL 36 (H) 21 - 32 mmol/L    ANION GAP 3 (L) 4 - 13 mmol/L    BUN 20 (H) 7 - 18 mg/dL    CREATININE 8.92 (H) 0.55 - 1.02 mg/dL    BUN/CREA RATIO 19     ESTIMATED GFR 55 (L) >59 mL/min/1.28m^2    ALBUMIN 3.3 (L) 3.4 - 5.0 g/dL    CALCIUM  8.9 8.5 - 10.1 mg/dL  GLUCOSE 107 (H) 74 - 106 mg/dL    ALKALINE PHOSPHATASE 61 46 - 116 U/L    ALT (SGPT) 6 <=78 U/L    AST (SGOT) 14 (L) 15 - 37 U/L    BILIRUBIN TOTAL 0.7 0.2 - 1.0 mg/dL    PROTEIN TOTAL 6.3 (L) 6.4 - 8.2 g/dL    ALBUMIN/GLOBULIN RATIO 1.1 0.8 - 1.4    OSMOLALITY, CALCULATED 281 270 - 290 mOsm/kg    CALCIUM , CORRECTED 9.5 mg/dL    GLOBULIN 3.0    TROPONIN-I NOW   Result Value Ref Range    TROPONIN I 14 <15 ng/L   MAGNESIUM    Result  Value Ref Range    MAGNESIUM  2.0 1.8 - 2.4 mg/dL   CBC WITH DIFF   Result Value Ref Range    WBC 7.3 3.8 - 11.8 x10^3/uL    RBC 3.69 3.63 - 4.92 x10^6/uL    HGB 9.8 (L) 10.9 - 14.3 g/dL    HCT 68.8 (L) 68.7 - 41.9 %    MCV 84.2 75.5 - 95.3 fL    MCH 26.4 24.7 - 32.8 pg    MCHC 31.4 (L) 32.3 - 35.6 g/dL    RDW 81.6 (H) 87.6 - 17.7 %    PLATELETS 145 140 - 440 x10^3/uL    MPV 10.2 7.9 - 10.8 fL    NEUTROPHIL % 77 43 - 77 %    LYMPHOCYTE % 13 (L) 16 - 46 %    MONOCYTE % 9 4 - 11 %    EOSINOPHIL % 1 1 - 7 %    BASOPHIL % 0 0 - 1 %    NEUTROPHIL # 5.59 1.90 - 8.20 x10^3/uL    LYMPHOCYTE # 0.95 (L) 1.10 - 3.10 x10^3/uL    MONOCYTE # 0.65 0.20 - 0.90 x10^3/uL    EOSINOPHIL # 0.08 0.00 - 0.50 x10^3/uL    BASOPHIL # 0.03 0.00 - 0.10 x10^3/uL   LACTIC ACID LEVEL W/ REFLEX FOR LEVEL >2.0   Result Value Ref Range    LACTIC ACID 1.2 0.4 - 2.0 mmol/L   NT-PROBNP   Result Value Ref Range    NT-PROBNP 2,411 (H) <=125 pg/mL   TROPONIN-I IN ONE HOUR   Result Value Ref Range    TROPONIN I 14 <15 ng/L   TROPONIN-I IN THREE HOURS   Result Value Ref Range    TROPONIN I 13 <15 ng/L   COMPREHENSIVE METABOLIC PANEL, NON-FASTING   Result Value Ref Range    SODIUM 138 136 - 145 mmol/L    POTASSIUM 4.4 3.5 - 5.1 mmol/L    CHLORIDE 101 98 - 107 mmol/L    CO2 TOTAL 32 (H) 21 - 31 mmol/L    ANION GAP 5 4 - 13 mmol/L    BUN 18 7 - 25 mg/dL    CREATININE 9.27 9.39 - 1.30 mg/dL    BUN/CREA RATIO 25 (H) 6 - 22    ESTIMATED GFR 88 >59 mL/min/1.41m^2    ALBUMIN 3.6 3.5 - 5.7 g/dL    CALCIUM  8.7 8.6 - 10.3 mg/dL    GLUCOSE 852 (H) 74 - 109 mg/dL    ALKALINE PHOSPHATASE 46 34 - 104 U/L    ALT (SGPT) 7 7 - 52 U/L    AST (SGOT) 12 (L) 13 - 39 U/L    BILIRUBIN TOTAL 0.7 0.3 - 1.0 mg/dL    PROTEIN TOTAL 5.8 (L) 6.4 - 8.9 g/dL    ALBUMIN/GLOBULIN RATIO 1.6 (H) 0.8 -  1.4    OSMOLALITY, CALCULATED 280 270 - 290 mOsm/kg    CALCIUM , CORRECTED 9.0 8.9 - 10.8 mg/dL    GLOBULIN 2.2 2.0 - 3.5   MAGNESIUM    Result Value Ref Range    MAGNESIUM  2.1 1.9 - 2.7 mg/dL   CBC  WITH DIFF   Result Value Ref Range    WBC 5.9 3.8 - 11.8 x10^3/uL    RBC 3.81 3.63 - 4.92 x10^6/uL    HGB 9.6 (L) 10.9 - 14.3 g/dL    HCT 69.4 (L) 68.7 - 41.9 %    MCV 79.9 75.5 - 95.3 fL    MCH 25.3 24.7 - 32.8 pg    MCHC 31.6 (L) 32.3 - 35.6 g/dL    RDW 82.0 (H) 87.6 - 17.7 %    PLATELETS 128 (L) 140 - 440 x10^3/uL    MPV 9.6 7.9 - 10.8 fL    NEUTROPHIL % 92 (H) 43 - 77 %    LYMPHOCYTE % 6 (L) 16 - 46 %    MONOCYTE % 1 (L) 4 - 11 %    EOSINOPHIL % 0 (L) 1 - 7 %    BASOPHIL % 0 0 - 1 %    NEUTROPHIL # 5.40 1.90 - 8.20 x10^3/uL    LYMPHOCYTE # 0.40 (L) 1.10 - 3.10 x10^3/uL    MONOCYTE # 0.10 (L) 0.20 - 0.90 x10^3/uL    EOSINOPHIL # 0.00 0.00 - 0.50 x10^3/uL    BASOPHIL # 0.00 0.00 - 0.10 x10^3/uL   SCAN DIFFERENTIAL   Result Value Ref Range    ANISOCYTOSIS 2+/Moderate     SCHISTOCYTES Absent     PLATELET MORPHOLOGY COMMENT Adequate        Imaging Studies:    Images and Reports reviewed to current date.      ACP:  Advance directives discussed and information provided.    Code Status:  Currently a full code and wishes to remain a full code.     GOC:  Patient resides at home with her son.  She states her daughter helped her with her ADL's due to shortness of breath making it difficult to complete her activities. Pt reports that she also doesn't walk to far because of weakness in her legs and shortness of breath.  She states she has a walker and cane at home if needed.  We reviewed her current condition and plan of care.  Pt states she has an appointment scheduled with Ronal Kerns in pulmonary here at the hospital.  She uses nebulizers at home, has inhalers but doesn't use them, wears oxygen @ 3L at home.  Pt follows with Dr. Sheralyn for cardiology and talked diuretics for her CHF.  Pt states she doesn't follow any special diet.  Nutrition and CHF discussed.  Pt smokes 1/2 PPD.  Overall effects smoking can have on her health was discussed and smoking cessation advised. Pt states she isn't interested in quitting.  Education  on the disease trajectories of chronic health conditions and impact on overall health discussed, patient voiced understanding and had no questions.  Palliative care discussed and patient would like a referral to a palliative agency that services her area.      PPS prior to hospitalization: 50%    PPS currently: 50%      Persons present and participating in discussion: Patient      Was the conversation voluntary? Yes      Plan:  A referral will be made at discharge to  a community palliative agency that services the county of patient's residence.    Patient Disposition/Discharge needs:  Home    ACP/GOC time:     18 minutes    Total visit time:     38 minutes including ACP/GOC time, Face to face, reviewing medical records, and documentation time.    Thank you for this consult.    Darice KATHEE Potters, APRN,NP-C  Palliative Care Nurse Practitioner    This note may have been partially generated using Mmodal Fluency Direct system and there may be some incorrect words, spellings, and punctuation that were not noted in checking the note before saving.

## 2024-02-15 NOTE — Care Management Notes (Signed)
 [facility]  Care Management Initial Evaluation    Patient Name: Misty Johnson  Date of Birth: June 12, 1951  Sex: female  Date/Time of Admission: 02/14/2024  6:19 PM  Room/Bed: 322/A  Payor: HUMANA MEDICARE / Plan: MYLENE MEDICARE / Product Type: MEDICARE [facility] /   Primary Care Providers:  Andra Pastor, MD, MD (General)    Pharmacy Info:   Preferred Pharmacy       [facility] [address] - [place], [place] - [address]    [address] [place] [address]    Phone: [phone] Fax: [phone]    Hours: Not open 24 hours    [facility] [address] [place] - [address]    [address] [place] [address]    Phone: [phone] Fax: [phone]    Hours: Not open 24 hours          Emergency Contact Info:   Extended Emergency Contact Information  Primary Emergency Contact: Pelzer,Misty Johnson  Mobile Phone: [phone]  Relation: Daughter  Interpreter needed? No    History:   Misty Johnson is a 73 y.o., female, admitted     Height/Weight: 162.6 cm (5' 4) / 78.5 kg (173 lb 1.6 oz)     LOS: 0 days   Admitting Diagnosis: Acute exacerbation of chronic obstructive pulmonary disease (COPD) (CMS HCC) [J44.1]    Assessment:      02/15/24 1458   Assessment Details   Assessment Type Admission   Date of Care Management Update 02/15/24   Readmission   Is this a readmission? No   Insurance Information/Type   Insurance type Medicare   Employment/Financial   Patient has Prescription Coverage?  Yes        Name of Insurance Coverage for Medications HUMANA MEDICARE   Financial/Environmental Concerns none   Living Environment   Select an age group to open lives with row.  Adult   Lives With child(ren), adult   Living Arrangements house   Able to Return to Prior Arrangements yes   Home Safety   Home Assessment: No Problems Identified   Home Accessibility stairs to enter home;stairs (2 railings present)   Custody and Legal Status   Do you have a court appointed  guardian/conservator? No   Are you an emancipated minor? No   Custody Issues? No   Paternity Affidavit Requested? No   Care Management Plan   Discharge Planning Status initial meeting   Discharge plan discussed with: Patient   CM will evaluate for rehabilitation potential yes   Patient choice offered to patient/family Yes   Form for patient choice reviewed/signed and on chart yes   Facility or Agency Preferences [facility] FOR [facility] PLUS ADD AIDE.   Discharge Needs Assessment   Equipment Currently Used at Home oxygen;cane, straight;commode;walker, front wheeled;wheelchair;shower chair   Equipment Needed After Discharge none   Discharge Facility/Level of Care Needs Home with Home Health (code 6)   Transportation Available family or friend will provide   Discharge Information   Discharge Disposition home health;home or self-care   Referral Information   Admission Type inpatient   Address Verified verified-no changes   Arrived From home or self-care   ADVANCE DIRECTIVES   Does the Patient have an Advance Directive? No, Information Offered and Refused   Home Main Entrance   Number of Stairs, Main Entrance six   Surface of Stairs, Main Entrance concrete   Stair Railings, Main Entrance railings on both sides of stairs  Discharge Plan:  Home with Home Health (code 6)  INITIAL ASSESSMENT: CM SPOKE WITH THE PATIENT REGARDING D/C GOALS. THE PATIENT STATES THAT She RESIDES WITH HER ADULT SON AND THAT HER DAUGHTER TRANSPORTS HER AS NEEDED. THE PATIENT STATES THAT She HAS THE DME THAT She NEEDS INCLUDING HOME 02, 3 LITERS, THROUGH [facility]. THE PATIENT STATES THAT A HH AGENCY JUST STARTED SERVICES AND She IS UNSURE OF THE NAME OF THE AGENCY. THE PATIENT STATES THAT CM CAN CONTACT HER DAUGHTER TO ASK THE NAME OF THE HH AGENCY. CM SPOKE WITH DAUGHTER Misty Johnson WHO STATES THAT THE HH IS [facility]. THE DAUGHTER STATES THAT HER BROTHER IS INTELLECTUALLY DISABLED AND THAT She HELPS WITH HIM AND ALSO HAS BEEN COOKING, CLEANING, AND DOING  LAUNDRY FOR THE PATIENT. THE DAUGHTER ASKED IF [facility] HAS AIDES. CM SAID CM WILL SPEAK WITH [facility] AND FIND OUT. CM SPOKE WITH DANIELLE AT [facility] AND WAS INFORMED THAT THEY DO OFFER AIDES. DANIELLE SAID THAT THE PATIENT HAS AN ORDER FOR NURSING, PT, AND OT. CM WILL ADD AIDE TO THE [facility] ORDER.     The patient will continue to be evaluated for developing discharge needs.     Case Manager: Orie Louder, SOCIAL WORKER  Phone: [phone]

## 2024-02-15 NOTE — ED Nurses Note (Signed)
[facility] here for transport to [facility]

## 2024-02-15 NOTE — Care Plan (Signed)
 Problem: Adult Inpatient Plan of Care  Goal: Plan of Care Review  Outcome: Ongoing (see interventions/notes)  Goal: Patient-Specific Goal (Individualized)  Outcome: Ongoing (see interventions/notes)  Goal: Absence of Hospital-Acquired Illness or Injury  Outcome: Ongoing (see interventions/notes)  Goal: Optimal Comfort and Wellbeing  Outcome: Ongoing (see interventions/notes)  Goal: Rounds/Family Conference  Outcome: Ongoing (see interventions/notes)

## 2024-02-15 NOTE — Progress Notes (Signed)
 [facility]               IP PROGRESS NOTE      Sundeep, Cary  Date of Admission:  02/14/2024  Date of Birth:  1950-10-12  Date of Service:  02/15/2024    Hospital Day:  LOS: 0 days     Subjective:   Misty Johnson is a 73 y/o female who is being seen in f/u for COPDAE. Patient states she is still wheezing but feels she is breathing some better. Currently on 2.5 L O2 via NC.  Uses 3L at home.  Wants her daughter to come visit her today and has asked if I could call her.  No other complaints voiced.       Vital Signs:  Temp (24hrs) Max:36.6 C (97.8 F)      Temperature: 36.3 C (97.3 F)  BP (Non-Invasive): (!) 126/45  MAP (Non-Invasive): 67 mmHG  Heart Rate: 60  Respiratory Rate: (!) 24  SpO2: 96 %    Current Medications:  aspirin  chewable tablet 81 mg, 81 mg, Oral, Daily  atorvastatin  (LIPITOR) tablet, 40 mg, Oral, QPM  budesonide  (PULMICORT  RESPULES) 0.5 mg/2 mL nebulizer suspension, 1 mg, Nebulization, 2x/day  doxycycline  hyclate 100 mg in NS 100 mL IVPB with adaptor, 100 mg, Intravenous, Q12H  enoxaparin  PF (LOVENOX ) 40 mg/0.4 mL SubQ injection, 40 mg, Subcutaneous, Daily  escitalopram  (LEXAPRO ) tablet, 20 mg, Oral, Daily  furosemide  (LASIX ) tablet, 40 mg, Oral, Daily  guaiFENesin  100mg  per 5mL oral liquid - for cough (expectorant), 200 mg, Oral, Q4H PRN  ipratropium-albuterol  0.5 mg-3 mg(2.5 mg base)/3 mL Solution for Nebulization, 3 mL, Nebulization, 4x/day  ipratropium-albuterol  0.5 mg-3 mg(2.5 mg base)/3 mL Solution for Nebulization, 3 mL, Nebulization, Q4H PRN  loratadine  (CLARITIN ) tablet, 10 mg, Oral, Daily  methylPREDNISolone  sod succ (SOLU-medrol ) 40 mg/mL injection, 40 mg, Intravenous, Q8H  metoprolol  succinate (TOPROL -XL) 24 hr extended release tablet, 25 mg, Oral, Daily  NS flush syringe, 3 mL, Intracatheter, Q8HRS  NS flush syringe, 3 mL, Intracatheter, Q1H PRN  pantoprazole  (PROTONIX ) delayed release tablet, 40 mg, Oral, Daily  piperacillin -tazobactam (ZOSYN ) 4.5 g in NS 100 mL  IVPB with adaptor, 4.5 g, Intravenous, Q8H  polyethylene glycol (MIRALAX ) oral packet, 34 g, Oral, 2x/day  potassium chloride  (K-DUR) extended release tablet, 20 mEq, Oral, Daily  sennosides-docusate sodium  (SENOKOT-S) 8.6-50mg  per tablet, 2 Tablet, Oral, 2x/day        Current Orders:  Active Orders   Lab    CBC     Frequency: 0530 - AM DRAW     Number of Occurrences: 1 Occurrences    COMPREHENSIVE METABOLIC PANEL, NON-FASTING     Frequency: 0530 - AM DRAW     Number of Occurrences: 1 Occurrences    MAGNESIUM  - AM ONCE     Frequency: 0530 - AM DRAW     Number of Occurrences: 1 Occurrences   Diet    DIET CARDIAC (2G NA, LOWFAT, LOW CHOL) Do you want to initiate MNT Protocol? Yes     Frequency: All Meals     Number of Occurrences: 1 Occurrences   Nursing    CONTINUOUS CARDIAC MONITORING (ED USE ONLY)     Frequency: ONE TIME     Number of Occurrences: 1 Occurrences    INCENTIVE SPIROMETRY NURSING     Frequency: Q1H WA     Number of Occurrences: 250 Occurrences    PULSE OXIMETRY CONTINUOUS     Frequency: CONTINUOUS     Number of  Occurrences: Until Specified   Consult    IP CONSULT TO PALLIATIVE CARE     Frequency: ONE TIME     Number of Occurrences: 1 Occurrences   Respiratory Care    INCENTIVE SPIROMETRY - RT INSTRUCT     Frequency: ONE TIME     Number of Occurrences: 1 Occurrences    OXYGEN - NASAL CANNULA     Frequency: CONTINUOUS     Number of Occurrences: Until Specified     Order Comments: Flowrate should not exeed 6L/min except [facility] facility.          IV    INSERT & MAINTAIN PERIPHERAL IV ACCESS     Frequency: UNTIL DISCONTINUED     Number of Occurrences: Until Specified    INSERT & MAINTAIN PERIPHERAL IV ACCESS     Frequency: UNTIL DISCONTINUED     Number of Occurrences: Until Specified    PERIPHERAL IV DRESSING CHANGE     Frequency: PRN     Number of Occurrences: Until Specified   Admission    PATIENT CLASS/LEVEL OF CARE DESIGNATION - PRN     Frequency: ONE TIME     Number of Occurrences: 1 Occurrences     Order  Comments: I certify hospital services are required for this patient based on their diagnosis, age, co-morbidities, and risk of adverse event if discharged.  All hospital services provided to the patient will be in accordance to 42 CFR 412.3.  Information regarding the patients diagnosis, testing, and treatment may be found in the H&P and subsequent progress notes.       Medications    aspirin  chewable tablet 81 mg     Frequency: Daily     Dose: 81 mg     Route: Oral    atorvastatin  (LIPITOR) tablet     Frequency: QPM     Dose: 40 mg     Route: Oral    budesonide  (PULMICORT  RESPULES) 0.5 mg/2 mL nebulizer suspension     Frequency: 2x/day     Dose: 1 mg     Route: Nebulization    doxycycline  hyclate 100 mg in NS 100 mL IVPB with adaptor     Frequency: Q12H     Dose: 100 mg     Route: Intravenous    enoxaparin  PF (LOVENOX ) 40 mg/0.4 mL SubQ injection     Frequency: Daily     Dose: 40 mg     Route: Subcutaneous    escitalopram  (LEXAPRO ) tablet     Frequency: Daily     Dose: 20 mg     Route: Oral    furosemide  (LASIX ) tablet     Frequency: Daily     Dose: 40 mg     Route: Oral    guaiFENesin  100mg  per 5mL oral liquid - for cough (expectorant)     Frequency: Q4H PRN     Dose: 200 mg     Route: Oral    ipratropium-albuterol  0.5 mg-3 mg(2.5 mg base)/3 mL Solution for Nebulization     Frequency: 4x/day     Dose: 3 mL     Route: Nebulization    ipratropium-albuterol  0.5 mg-3 mg(2.5 mg base)/3 mL Solution for Nebulization     Frequency: Q4H PRN     Dose: 3 mL     Route: Nebulization    loratadine  (CLARITIN ) tablet     Frequency: Daily     Dose: 10 mg     Route: Oral    methylPREDNISolone  sod succ (  SOLU-medrol ) 40 mg/mL injection     Frequency: Q8H     Dose: 40 mg     Route: Intravenous    metoprolol  succinate (TOPROL -XL) 24 hr extended release tablet     Frequency: Daily     Dose: 25 mg     Route: Oral    NS flush syringe     Frequency: Q8HRS     Dose: 3 mL     Route: Intracatheter    NS flush syringe     Frequency: Q1H PRN      Dose: 3 mL     Route: Intracatheter    pantoprazole  (PROTONIX ) delayed release tablet     Frequency: Daily     Dose: 40 mg     Route: Oral    piperacillin -tazobactam (ZOSYN ) 4.5 g in NS 100 mL IVPB with adaptor     Frequency: Q8H     Dose: 4.5 g     Route: Intravenous    polyethylene glycol (MIRALAX ) oral packet     Frequency: 2x/day     Dose: 34 g     Route: Oral    potassium chloride  (K-DUR) extended release tablet     Frequency: Daily     Dose: 20 mEq     Route: Oral    sennosides-docusate sodium  (SENOKOT-S) 8.6-50mg  per tablet     Frequency: 2x/day     Dose: 2 Tablet     Route: Oral        Review of Systems:  Focused review of system was completed. Refer to the HPI for ROS details.     Today's Physical Exam:  Physical Exam  Constitutional:       General: She is not in acute distress.     Appearance: She is not ill-appearing.   HENT:      Head: Normocephalic and atraumatic.   Cardiovascular:      Rate and Rhythm: Normal rate and regular rhythm.   Pulmonary:      Effort: Pulmonary effort is normal.      Breath sounds: Wheezing present.   Abdominal:      General: Bowel sounds are normal.      Palpations: Abdomen is soft.   Musculoskeletal:      Right lower leg: No edema.      Left lower leg: No edema.   Skin:     General: Skin is warm.   Neurological:      General: No focal deficit present.      Mental Status: She is alert and oriented to person, place, and time.   Psychiatric:         Mood and Affect: Mood normal.         Thought Content: Thought content normal.         Judgment: Judgment normal.        I/O:  I/O last 24 hours:    Intake/Output Summary (Last 24 hours) at 02/15/2024 1041  Last data filed at 02/15/2024 0902  Gross per 24 hour   Intake 1421 ml   Output --   Net 1421 ml     I/O current shift:  08/14 0700 - 08/14 1859  In: 107   Out: -   Labs:  Reviewed: I have reviewed all lab results.  Lab Results Today:    Results for orders placed or performed during the hospital encounter of 02/14/24 (from the past  24 hours)   ECG 12 LEAD   Result Value Ref Range  Ventricular rate 56 BPM    Atrial Rate 56 BPM    PR Interval 102 ms    QRS Duration 86 ms    QT Interval 396 ms    QTC Calculation 382 ms    Calculated P Axis 15 degrees    Calculated R Axis 10 degrees    Calculated T Axis 208 degrees   BLOOD GAS Arterial   Result Value Ref Range    %FIO2 (ARTERIAL) 36 %    PH (ARTERIAL) 7.45 7.35 - 7.45    PCO2 (ARTERIAL) 51 (H) 35 - 45 mm/Hg    PO2 (ARTERIAL) 85 83 - 108 mm/Hg    BICARBONATE (ARTERIAL) 32.5 (H) 21.0 - 28.0 mmol/L    BASE EXCESS (ARTERIAL) 9.7 (H) 0.0 - 3.0 mmol/L    PAO2/FIO2 RATIO 236     O2 SATURATION (ARTERIAL) 96.9 94.0 - 98.0 %   PT/INR   Result Value Ref Range    PROTHROMBIN TIME 12.4 9.8 - 12.7 seconds    INR 1.10 0.84 - 1.10   PTT (PARTIAL THROMBOPLASTIN TIME)   Result Value Ref Range    APTT 29.1 25.0 - 38.0 seconds   D-DIMER   Result Value Ref Range    D-DIMER 601 (HH) 215 - 500 ng/mL FEU   COMPREHENSIVE METABOLIC PANEL, NON-FASTING   Result Value Ref Range    SODIUM 139 136 - 145 mmol/L    POTASSIUM 4.0 3.5 - 5.1 mmol/L    CHLORIDE 100 98 - 107 mmol/L    CO2 TOTAL 36 (H) 21 - 32 mmol/L    ANION GAP 3 (L) 4 - 13 mmol/L    BUN 20 (H) 7 - 18 mg/dL    CREATININE 8.92 (H) 0.55 - 1.02 mg/dL    BUN/CREA RATIO 19     ESTIMATED GFR 55 (L) >59 mL/min/1.41m^2    ALBUMIN 3.3 (L) 3.4 - 5.0 g/dL    CALCIUM  8.9 8.5 - 10.1 mg/dL    GLUCOSE 892 (H) 74 - 106 mg/dL    ALKALINE PHOSPHATASE 61 46 - 116 U/L    ALT (SGPT) 6 <=78 U/L    AST (SGOT) 14 (L) 15 - 37 U/L    BILIRUBIN TOTAL 0.7 0.2 - 1.0 mg/dL    PROTEIN TOTAL 6.3 (L) 6.4 - 8.2 g/dL    ALBUMIN/GLOBULIN RATIO 1.1 0.8 - 1.4    OSMOLALITY, CALCULATED 281 270 - 290 mOsm/kg    CALCIUM , CORRECTED 9.5 mg/dL    GLOBULIN 3.0    TROPONIN-I NOW   Result Value Ref Range    TROPONIN I 14 <15 ng/L   MAGNESIUM    Result Value Ref Range    MAGNESIUM  2.0 1.8 - 2.4 mg/dL   CBC WITH DIFF   Result Value Ref Range    WBC 7.3 3.8 - 11.8 x10^3/uL    RBC 3.69 3.63 - 4.92 x10^6/uL    HGB  9.8 (L) 10.9 - 14.3 g/dL    HCT 68.8 (L) 68.7 - 41.9 %    MCV 84.2 75.5 - 95.3 fL    MCH 26.4 24.7 - 32.8 pg    MCHC 31.4 (L) 32.3 - 35.6 g/dL    RDW 81.6 (H) 87.6 - 17.7 %    PLATELETS 145 140 - 440 x10^3/uL    MPV 10.2 7.9 - 10.8 fL    NEUTROPHIL % 77 43 - 77 %    LYMPHOCYTE % 13 (L) 16 - 46 %    MONOCYTE % 9 4 - 11 %  EOSINOPHIL % 1 1 - 7 %    BASOPHIL % 0 0 - 1 %    NEUTROPHIL # 5.59 1.90 - 8.20 x10^3/uL    LYMPHOCYTE # 0.95 (L) 1.10 - 3.10 x10^3/uL    MONOCYTE # 0.65 0.20 - 0.90 x10^3/uL    EOSINOPHIL # 0.08 0.00 - 0.50 x10^3/uL    BASOPHIL # 0.03 0.00 - 0.10 x10^3/uL   LACTIC ACID LEVEL W/ REFLEX FOR LEVEL >2.0   Result Value Ref Range    LACTIC ACID 1.2 0.4 - 2.0 mmol/L   NT-PROBNP   Result Value Ref Range    NT-PROBNP 2,411 (H) <=125 pg/mL   TROPONIN-I IN ONE HOUR   Result Value Ref Range    TROPONIN I 14 <15 ng/L   TROPONIN-I IN THREE HOURS   Result Value Ref Range    TROPONIN I 13 <15 ng/L   COMPREHENSIVE METABOLIC PANEL, NON-FASTING   Result Value Ref Range    SODIUM 138 136 - 145 mmol/L    POTASSIUM 4.4 3.5 - 5.1 mmol/L    CHLORIDE 101 98 - 107 mmol/L    CO2 TOTAL 32 (H) 21 - 31 mmol/L    ANION GAP 5 4 - 13 mmol/L    BUN 18 7 - 25 mg/dL    CREATININE 9.27 9.39 - 1.30 mg/dL    BUN/CREA RATIO 25 (H) 6 - 22    ESTIMATED GFR 88 >59 mL/min/1.90m^2    ALBUMIN 3.6 3.5 - 5.7 g/dL    CALCIUM  8.7 8.6 - 10.3 mg/dL    GLUCOSE 852 (H) 74 - 109 mg/dL    ALKALINE PHOSPHATASE 46 34 - 104 U/L    ALT (SGPT) 7 7 - 52 U/L    AST (SGOT) 12 (L) 13 - 39 U/L    BILIRUBIN TOTAL 0.7 0.3 - 1.0 mg/dL    PROTEIN TOTAL 5.8 (L) 6.4 - 8.9 g/dL    ALBUMIN/GLOBULIN RATIO 1.6 (H) 0.8 - 1.4    OSMOLALITY, CALCULATED 280 270 - 290 mOsm/kg    CALCIUM , CORRECTED 9.0 8.9 - 10.8 mg/dL    GLOBULIN 2.2 2.0 - 3.5   MAGNESIUM    Result Value Ref Range    MAGNESIUM  2.1 1.9 - 2.7 mg/dL   CBC WITH DIFF   Result Value Ref Range    WBC 5.9 3.8 - 11.8 x10^3/uL    RBC 3.81 3.63 - 4.92 x10^6/uL    HGB 9.6 (L) 10.9 - 14.3 g/dL    HCT 69.4 (L) 68.7 - 41.9 %     MCV 79.9 75.5 - 95.3 fL    MCH 25.3 24.7 - 32.8 pg    MCHC 31.6 (L) 32.3 - 35.6 g/dL    RDW 82.0 (H) 87.6 - 17.7 %    PLATELETS 128 (L) 140 - 440 x10^3/uL    MPV 9.6 7.9 - 10.8 fL    NEUTROPHIL % 92 (H) 43 - 77 %    LYMPHOCYTE % 6 (L) 16 - 46 %    MONOCYTE % 1 (L) 4 - 11 %    EOSINOPHIL % 0 (L) 1 - 7 %    BASOPHIL % 0 0 - 1 %    NEUTROPHIL # 5.40 1.90 - 8.20 x10^3/uL    LYMPHOCYTE # 0.40 (L) 1.10 - 3.10 x10^3/uL    MONOCYTE # 0.10 (L) 0.20 - 0.90 x10^3/uL    EOSINOPHIL # 0.00 0.00 - 0.50 x10^3/uL    BASOPHIL # 0.00 0.00 - 0.10 x10^3/uL   SCAN DIFFERENTIAL  Result Value Ref Range    ANISOCYTOSIS 2+/Moderate     SCHISTOCYTES Absent     PLATELET MORPHOLOGY COMMENT Adequate      Micro Results: No results found for any visits on 02/14/24 (from the past 24 hours).  Images:   No results found.   CT ANGIO CHEST W IV CONTRAST   Final Result   1. NO PULMONARY EMBOLUS   2. BILATERAL BRONCHIAL WALL THICKENING   3. BILATERAL ATELECTASIS.               Radiologist location ID: [id number]         XR AP MOBILE CHEST   Final Result   NO ACUTE FINDINGS.            Radiologist location ID: [id number]              Problem List:  Active Hospital Problems   (*Primary Problem)    Diagnosis    *Acute exacerbation of chronic obstructive pulmonary disease (COPD) (CMS HCC)       Nutrition:    DIET CARDIAC (2G NA, LOWFAT, LOW CHOL) Do you want to initiate MNT Protocol? Yes    Additional clinical characteristics related to nutrition:    - monitor for weight changes   - monitor intake and output    - monitor bowel functions                   Assessment/ Plan:   Acute exacerbation of COPD  Chronic hypoxic respiratory failure   -IV doxy and IV Zosyn  will be continued for patient   -IV Solu-Medrol  40 mg q.8h   -DuoNebs and p.o. Robitussin will be ordered for increased shortness of breath and cough  -incentive spirometry PRN  -supplemental O2 titration PRN  -we will resume home medications    Major Depression   -restart home Lexapro       -further treatment adjustments will be made based off clinical course.  See orders for further details.  -Hospitalist personally evaluated and examined the patient in conjunction with the MLP and agree with the assessments, treatment plan and disposition of the patient as recorded by the [facility].       DVT/PE Prophylaxis: Lovenox            Raima Geathers D Loveda Colaizzi, PA-C

## 2024-02-15 NOTE — ED Nurses Note (Signed)
 Attempted to call daughter to inform of admission room #. No answer and mailbox full.

## 2024-02-15 NOTE — ED Nurses Note (Signed)
 Report called to Melissa at [facility]

## 2024-02-16 DIAGNOSIS — Z7951 Long term (current) use of inhaled steroids: Secondary | ICD-10-CM

## 2024-02-16 DIAGNOSIS — Z7952 Long term (current) use of systemic steroids: Secondary | ICD-10-CM

## 2024-02-16 LAB — CBC
HCT: 30.1 % — ABNORMAL LOW (ref 31.2–41.9)
HGB: 9.7 g/dL — ABNORMAL LOW (ref 10.9–14.3)
MCH: 25.5 pg (ref 24.7–32.8)
MCHC: 32.1 g/dL — ABNORMAL LOW (ref 32.3–35.6)
MCV: 79.4 fL (ref 75.5–95.3)
MPV: 9.4 fL (ref 7.9–10.8)
PLATELETS: 140 x10ˆ3/uL (ref 140–440)
RBC: 3.79 x10ˆ6/uL (ref 3.63–4.92)
RDW: 18 % — ABNORMAL HIGH (ref 12.3–17.7)
WBC: 9.7 x10ˆ3/uL (ref 3.8–11.8)

## 2024-02-16 LAB — COMPREHENSIVE METABOLIC PANEL, NON-FASTING
ALBUMIN/GLOBULIN RATIO: 1.5 — ABNORMAL HIGH (ref 0.8–1.4)
ALBUMIN: 3.7 g/dL (ref 3.5–5.7)
ALKALINE PHOSPHATASE: 45 U/L (ref 34–104)
ALT (SGPT): 7 U/L (ref 7–52)
ANION GAP: 6 mmol/L (ref 4–13)
AST (SGOT): 11 U/L — ABNORMAL LOW (ref 13–39)
BILIRUBIN TOTAL: 0.7 mg/dL (ref 0.3–1.0)
BUN/CREA RATIO: 25 — ABNORMAL HIGH (ref 6–22)
BUN: 19 mg/dL (ref 7–25)
CALCIUM, CORRECTED: 9.3 mg/dL (ref 8.9–10.8)
CALCIUM: 9.1 mg/dL (ref 8.6–10.3)
CHLORIDE: 100 mmol/L (ref 98–107)
CO2 TOTAL: 32 mmol/L — ABNORMAL HIGH (ref 21–31)
CREATININE: 0.76 mg/dL (ref 0.60–1.30)
ESTIMATED GFR: 82 mL/min/1.73mˆ2 (ref >59)
GLOBULIN: 2.4 (ref 2.0–3.5)
GLUCOSE: 129 mg/dL — ABNORMAL HIGH (ref 74–109)
OSMOLALITY, CALCULATED: 280 mosm/kg (ref 270–290)
POTASSIUM: 4.5 mmol/L (ref 3.5–5.1)
PROTEIN TOTAL: 6.1 g/dL — ABNORMAL LOW (ref 6.4–8.9)
SODIUM: 138 mmol/L (ref 136–145)

## 2024-02-16 LAB — MAGNESIUM: MAGNESIUM: 2.1 mg/dL (ref 1.9–2.7)

## 2024-02-16 MED ORDER — PREDNISONE 20 MG TABLET
40.0000 mg | ORAL_TABLET | Freq: Every morning | ORAL | Status: DC
Start: 2024-02-17 — End: 2024-02-22
  Administered 2024-02-17: 40 mg via ORAL
  Filled 2024-02-16: qty 2

## 2024-02-16 MED ORDER — IPRATROPIUM 0.5 MG-ALBUTEROL 3 MG (2.5 MG BASE)/3 ML NEBULIZATION SOLN
3.0000 mL | INHALATION_SOLUTION | Freq: Four times a day (QID) | RESPIRATORY_TRACT | Status: DC
Start: 2024-02-16 — End: 2024-02-17
  Administered 2024-02-16: 0 mL via RESPIRATORY_TRACT
  Administered 2024-02-16: 3 mL via RESPIRATORY_TRACT
  Administered 2024-02-17: 0 mL via RESPIRATORY_TRACT
  Administered 2024-02-17: 3 mL via RESPIRATORY_TRACT
  Filled 2024-02-16: qty 3

## 2024-02-16 MED ORDER — DOXYCYCLINE HYCLATE 100 MG TABLET
100.0000 mg | ORAL_TABLET | Freq: Two times a day (BID) | ORAL | Status: AC
Start: 2024-02-16 — End: 2024-02-19
  Administered 2024-02-16 – 2024-02-19 (×7): 100 mg via ORAL
  Filled 2024-02-16 (×7): qty 1

## 2024-02-16 MED ORDER — NICOTINE 21 MG/24 HR DAILY TRANSDERMAL PATCH
21.0000 mg | MEDICATED_PATCH | Freq: Every day | TRANSDERMAL | Status: DC
Start: 2024-02-16 — End: 2024-02-19
  Administered 2024-02-16: 21 mg via TRANSDERMAL
  Administered 2024-02-17 – 2024-02-19 (×3): 0 mg via TRANSDERMAL
  Filled 2024-02-16 (×3): qty 1

## 2024-02-16 MED ORDER — RAMELTEON 8 MG TABLET
8.0000 mg | ORAL_TABLET | Freq: Every evening | ORAL | Status: DC
Start: 2024-02-16 — End: 2024-02-19
  Administered 2024-02-16 – 2024-02-18 (×3): 8 mg via ORAL
  Filled 2024-02-16 (×3): qty 1

## 2024-02-16 NOTE — Progress Notes (Signed)
 [facility]               IP PROGRESS NOTE      [name], [name]  Date of Admission:  02/14/2024  Date of Birth:  [birth year]  Date of Service:  02/16/2024    Hospital Day:  LOS: 0 days     Subjective:   Misty Johnson is a 73 y/o female who is being seen in f/u for COPDAE. Patient states she is still wheezing but feels she is breathing some better. Currently on 2.5 L O2 via NC.  Uses 3L at home.  Wants her daughter to come visit her today and has asked if I could call her.  No other complaints voiced.     02/16/2024 Patient seen and examined at bedside.  States she wants to go home. Still wheezing.  Has not walked with PT.   On 3L O2 .       Vital Signs:  Temp (24hrs) Max:36.7 C (98 F)      Temperature: 36.7 C (98 F)  BP (Non-Invasive): (!) 134/51  MAP (Non-Invasive): 75 mmHG  Heart Rate: 63  Respiratory Rate: (!) 21  SpO2: 99 %    Current Medications:  aspirin  chewable tablet 81 mg, 81 mg, Oral, Daily  atorvastatin  (LIPITOR) tablet, 40 mg, Oral, QPM  budesonide  (PULMICORT  RESPULES) 0.5 mg/2 mL nebulizer suspension, 1 mg, Nebulization, 2x/day  doxycycline  tablet, 100 mg, Oral, 2x/day  enoxaparin  PF (LOVENOX ) 40 mg/0.4 mL SubQ injection, 40 mg, Subcutaneous, Daily  escitalopram  (LEXAPRO ) tablet, 20 mg, Oral, Daily  furosemide  (LASIX ) tablet, 40 mg, Oral, Daily  guaiFENesin  100mg  per 5mL oral liquid - for cough (expectorant), 200 mg, Oral, Q4H PRN  ipratropium-albuterol  0.5 mg-3 mg(2.5 mg base)/3 mL Solution for Nebulization, 3 mL, Nebulization, Q6H  loratadine  (CLARITIN ) tablet, 10 mg, Oral, Daily  metoprolol  succinate (TOPROL -XL) 24 hr extended release tablet, 25 mg, Oral, Daily  nicotine  (NICODERM CQ ) transdermal patch (mg/24 hr), 21 mg, Transdermal, Daily  NS flush syringe, 3 mL, Intracatheter, Q8HRS  NS flush syringe, 3 mL, Intracatheter, Q1H PRN  pantoprazole  (PROTONIX ) delayed release tablet, 40 mg, Oral, Daily  polyethylene glycol (MIRALAX ) oral packet, 34 g, Oral, 2x/day  potassium chloride   (K-DUR) extended release tablet, 20 mEq, Oral, Daily  [START ON 02/17/2024] predniSONE  (DELTASONE ) tablet, 40 mg, Oral, Daily with Breakfast  ramelteon  (ROZEREM ) tablet, 8 mg, Oral, NIGHTLY  sennosides-docusate sodium  (SENOKOT-S) 8.6-50mg  per tablet, 2 Tablet, Oral, 2x/day        Current Orders:  Active Orders   Diet    DIET CARDIAC (2G NA, LOWFAT, LOW CHOL) Do you want to initiate MNT Protocol? Yes     Frequency: All Meals     Number of Occurrences: 1 Occurrences   Nursing    CONTINUOUS CARDIAC MONITORING (ED USE ONLY)     Frequency: ONE TIME     Number of Occurrences: 1 Occurrences    INCENTIVE SPIROMETRY NURSING     Frequency: Q1H WA     Number of Occurrences: 250 Occurrences    PULSE OXIMETRY CONTINUOUS     Frequency: CONTINUOUS     Number of Occurrences: Until Specified   Code Status    FULL CODE: ATTEMPT RESUSCITATION / CPR     Frequency: CONTINUOUS     Number of Occurrences: Until Specified     Order Comments: Patient wishes for full ICU level care including advanced airway interventions / mechanical ventilation.     In the event of pulseless cardiac arrest, patient  consents to ACLS (advanced cardiac life support) to attempt resuscitation.  IE - Consents to chest compressions, life support including intubation, mechanical ventilation, defibrillation/cardioversion as indicated.         Consult    IP CONSULT TO PALLIATIVE CARE     Frequency: ONE TIME     Number of Occurrences: 1 Occurrences   Respiratory Care    INCENTIVE SPIROMETRY - RT INSTRUCT     Frequency: ONE TIME     Number of Occurrences: 1 Occurrences    OXYGEN - NASAL CANNULA     Frequency: CONTINUOUS     Number of Occurrences: Until Specified     Order Comments: Flowrate should not exeed 6L/min except [facility] facility.          IV    INSERT & MAINTAIN PERIPHERAL IV ACCESS     Frequency: UNTIL DISCONTINUED     Number of Occurrences: Until Specified    INSERT & MAINTAIN PERIPHERAL IV ACCESS     Frequency: UNTIL DISCONTINUED     Number of Occurrences: Until  Specified    PERIPHERAL IV DRESSING CHANGE     Frequency: PRN     Number of Occurrences: Until Specified   Admission    PATIENT CLASS/LEVEL OF CARE DESIGNATION - PRN     Frequency: ONE TIME     Number of Occurrences: 1 Occurrences     Order Comments: I certify hospital services are required for this patient based on their diagnosis, age, co-morbidities, and risk of adverse event if discharged.  All hospital services provided to the patient will be in accordance to 42 CFR 412.3.  Information regarding the patients diagnosis, testing, and treatment may be found in the H&P and subsequent progress notes.       Medications    aspirin  chewable tablet 81 mg     Frequency: Daily     Dose: 81 mg     Route: Oral    atorvastatin  (LIPITOR) tablet     Frequency: QPM     Dose: 40 mg     Route: Oral    budesonide  (PULMICORT  RESPULES) 0.5 mg/2 mL nebulizer suspension     Frequency: 2x/day     Dose: 1 mg     Route: Nebulization    doxycycline  tablet     Frequency: 2x/day     Dose: 100 mg     Route: Oral    enoxaparin  PF (LOVENOX ) 40 mg/0.4 mL SubQ injection     Frequency: Daily     Dose: 40 mg     Route: Subcutaneous    escitalopram  (LEXAPRO ) tablet     Frequency: Daily     Dose: 20 mg     Route: Oral    furosemide  (LASIX ) tablet     Frequency: Daily     Dose: 40 mg     Route: Oral    guaiFENesin  100mg  per 5mL oral liquid - for cough (expectorant)     Frequency: Q4H PRN     Dose: 200 mg     Route: Oral    ipratropium-albuterol  0.5 mg-3 mg(2.5 mg base)/3 mL Solution for Nebulization     Frequency: 4x/day     Dose: 3 mL     Route: Nebulization    ipratropium-albuterol  0.5 mg-3 mg(2.5 mg base)/3 mL Solution for Nebulization     Frequency: Q6H     Dose: 3 mL     Route: Nebulization    loratadine  (CLARITIN ) tablet     Frequency: Daily  Dose: 10 mg     Route: Oral    metoprolol  succinate (TOPROL -XL) 24 hr extended release tablet     Frequency: Daily     Dose: 25 mg     Route: Oral    nicotine  (NICODERM CQ ) transdermal patch (mg/24 hr)      Frequency: Daily     Dose: 21 mg     Route: Transdermal    NS flush syringe     Frequency: Q8HRS     Dose: 3 mL     Route: Intracatheter    NS flush syringe     Frequency: Q1H PRN     Dose: 3 mL     Route: Intracatheter    pantoprazole  (PROTONIX ) delayed release tablet     Frequency: Daily     Dose: 40 mg     Route: Oral    polyethylene glycol (MIRALAX ) oral packet     Frequency: 2x/day     Dose: 34 g     Route: Oral    potassium chloride  (K-DUR) extended release tablet     Frequency: Daily     Dose: 20 mEq     Route: Oral    predniSONE  (DELTASONE ) tablet     Frequency: Daily with Breakfast     Dose: 40 mg     Route: Oral    ramelteon  (ROZEREM ) tablet     Frequency: NIGHTLY     Dose: 8 mg     Route: Oral    sennosides-docusate sodium  (SENOKOT-S) 8.6-50mg  per tablet     Frequency: 2x/day     Dose: 2 Tablet     Route: Oral        Review of Systems:  Focused review of system was completed. Refer to the HPI for ROS details.     Today's Physical Exam:  Physical Exam  Constitutional:       General: She is not in acute distress.     Appearance: She is not ill-appearing.   HENT:      Head: Normocephalic and atraumatic.   Cardiovascular:      Rate and Rhythm: Normal rate and regular rhythm.   Pulmonary:      Effort: Pulmonary effort is normal.      Breath sounds: Wheezing present.   Abdominal:      General: Bowel sounds are normal.      Palpations: Abdomen is soft.   Musculoskeletal:      Right lower leg: No edema.      Left lower leg: No edema.   Skin:     General: Skin is warm.   Neurological:      General: No focal deficit present.      Mental Status: She is alert and oriented to person, place, and time.   Psychiatric:         Mood and Affect: Mood normal.         Thought Content: Thought content normal.         Judgment: Judgment normal.        I/O:  I/O last 24 hours:    Intake/Output Summary (Last 24 hours) at 02/16/2024 1312  Last data filed at 02/15/2024 2143  Gross per 24 hour   Intake 347 ml   Output --   Net 347  ml     I/O current shift:  No intake/output data recorded.  Labs:  Reviewed: I have reviewed all lab results.  Lab Results Today:    Results for orders placed  or performed during the hospital encounter of 02/14/24 (from the past 24 hours)   CBC   Result Value Ref Range    WBC 9.7 3.8 - 11.8 x10^3/uL    RBC 3.79 3.63 - 4.92 x10^6/uL    HGB 9.7 (L) 10.9 - 14.3 g/dL    HCT 69.8 (L) 68.7 - 41.9 %    MCV 79.4 75.5 - 95.3 fL    MCH 25.5 24.7 - 32.8 pg    MCHC 32.1 (L) 32.3 - 35.6 g/dL    RDW 81.9 (H) 87.6 - 17.7 %    PLATELETS 140 140 - 440 x10^3/uL    MPV 9.4 7.9 - 10.8 fL   COMPREHENSIVE METABOLIC PANEL, NON-FASTING   Result Value Ref Range    SODIUM 138 136 - 145 mmol/L    POTASSIUM 4.5 3.5 - 5.1 mmol/L    CHLORIDE 100 98 - 107 mmol/L    CO2 TOTAL 32 (H) 21 - 31 mmol/L    ANION GAP 6 4 - 13 mmol/L    BUN 19 7 - 25 mg/dL    CREATININE 9.23 9.39 - 1.30 mg/dL    BUN/CREA RATIO 25 (H) 6 - 22    ESTIMATED GFR 82 >59 mL/min/1.20m^2    ALBUMIN 3.7 3.5 - 5.7 g/dL    CALCIUM  9.1 8.6 - 10.3 mg/dL    GLUCOSE 870 (H) 74 - 109 mg/dL    ALKALINE PHOSPHATASE 45 34 - 104 U/L    ALT (SGPT) 7 7 - 52 U/L    AST (SGOT) 11 (L) 13 - 39 U/L    BILIRUBIN TOTAL 0.7 0.3 - 1.0 mg/dL    PROTEIN TOTAL 6.1 (L) 6.4 - 8.9 g/dL    ALBUMIN/GLOBULIN RATIO 1.5 (H) 0.8 - 1.4    OSMOLALITY, CALCULATED 280 270 - 290 mOsm/kg    CALCIUM , CORRECTED 9.3 8.9 - 10.8 mg/dL    GLOBULIN 2.4 2.0 - 3.5   MAGNESIUM  - AM ONCE   Result Value Ref Range    MAGNESIUM  2.1 1.9 - 2.7 mg/dL     Micro Results: No results found for any visits on 02/14/24 (from the past 24 hours).  Images:   CT ANGIO CHEST W IV CONTRAST  Result Date: 02/14/2024  Impression 1. NO PULMONARY EMBOLUS 2. BILATERAL BRONCHIAL WALL THICKENING 3. BILATERAL ATELECTASIS. Radiologist location ID: [id number]     XR AP MOBILE CHEST  Result Date: 02/14/2024  Impression NO ACUTE FINDINGS. Radiologist location ID: [id number]      CT ANGIO CHEST W IV CONTRAST   Final Result   1. NO PULMONARY EMBOLUS    2. BILATERAL BRONCHIAL WALL THICKENING   3. BILATERAL ATELECTASIS.               Radiologist location ID: [id number]         XR AP MOBILE CHEST   Final Result   NO ACUTE FINDINGS.            Radiologist location ID: [id number]              Problem List:  Active Hospital Problems   (*Primary Problem)    Diagnosis    *Acute exacerbation of chronic obstructive pulmonary disease (COPD) (CMS HCC)       Nutrition:    DIET CARDIAC (2G NA, LOWFAT, LOW CHOL) Do you want to initiate MNT Protocol? Yes    Additional clinical characteristics related to nutrition:    - monitor for weight changes   - monitor intake and output    -  monitor bowel functions                   Assessment/ Plan:   Acute exacerbation of COPD  Chronic hypoxic respiratory failure   -po doxycycline    -Prednisone  40 mg daily   -DuoNebs   -incentive spirometry PRN  -supplemental O2 titration PRN  -Will most likely need another day     Major Depression   -restart home Lexapro      -further treatment adjustments will be made based off clinical course.  See orders for further details.  -Hospitalist personally evaluated and examined the patient in conjunction with the MLP and agree with the assessments, treatment plan and disposition of the patient as recorded by the [facility].       DVT/PE Prophylaxis: Lovenox            Leary Mcnulty D Tametra Ahart, PA-C

## 2024-02-16 NOTE — Care Plan (Signed)
 [facility]  Care Plan Note    Situation: Acute COPD exacerbation    Intervention: Patient continues on IV antibiotics. Patient intermittently confused overnight but quickly re-oriented. Patient history of smoking 1/2-1pack daily. PRN cough medication administered once this shift. Patient SPO2 dropped into mid 80's with coughing spells and activity.    Response: Coughing decreased after PRN cough medication.      M'Kayla Froylan, RN    Problem: Adult Inpatient Plan of Care  Goal: Plan of Care Review  Outcome: Ongoing (see interventions/notes)  Goal: Patient-Specific Goal (Individualized)  Outcome: Ongoing (see interventions/notes)  Goal: Absence of Hospital-Acquired Illness or Injury  Outcome: Ongoing (see interventions/notes)  Goal: Optimal Comfort and Wellbeing  Outcome: Ongoing (see interventions/notes)  Goal: Rounds/Family Conference  Outcome: Ongoing (see interventions/notes)     Problem: Fall Injury Risk  Goal: Absence of Fall and Fall-Related Injury  Outcome: Ongoing (see interventions/notes)     Problem: Gas Exchange Impaired  Goal: Optimal Gas Exchange  Outcome: Ongoing (see interventions/notes)

## 2024-02-16 NOTE — Care Management Notes (Signed)
 [facility]  Care Management Note    Patient Name: Misty Johnson  Date of Birth: 1950/07/25  Sex: female  Date/Time of Admission: 02/14/2024  6:19 PM  Room/Bed: 322/A  Payor: HU[employer]NA MEDICARE / Plan: HUMANA MEDICARE / Product Type: MEDICARE [facility] /    LOS: 0 days   Primary Care Providers:  Andra Pastor, MD, MD (General)    Admitting Diagnosis:  Acute exacerbation of chronic obstructive pulmonary disease (COPD) (CMS HCC) [J44.1]    Assessment:       Discharge Plan:  Home with Home Health (code 6)  CM PLACED REFERRAL WITH [facility] ORDER IN CAREPORT FOR [facility] (PT/OT/SKILLED NURSING, AIDE).    The patient will continue to be evaluated for developing discharge needs.     Case Manager: Orie Louder, SOCIAL WORKER  Phone: [phone]

## 2024-02-17 DIAGNOSIS — R443 Hallucinations, unspecified: Secondary | ICD-10-CM

## 2024-02-17 MED ORDER — OLANZAPINE 5 MG DISINTEGRATING TABLET
5.0000 mg | ORAL_TABLET | Freq: Every day | ORAL | Status: DC | PRN
Start: 2024-02-17 — End: 2024-02-19

## 2024-02-17 MED ORDER — METOPROLOL SUCCINATE ER 25 MG TABLET,EXTENDED RELEASE 24 HR
12.5000 mg | ORAL_TABLET | Freq: Every day | ORAL | Status: DC
Start: 2024-02-18 — End: 2024-02-19
  Administered 2024-02-18: 12.5 mg via ORAL
  Administered 2024-02-19: 0 mg via ORAL
  Filled 2024-02-17 (×2): qty 1

## 2024-02-17 MED ORDER — METHYLPREDNISOLONE SOD SUCCINATE 40 MG/ML SOLUTION FOR INJ. WRAPPER
40.0000 mg | Freq: Two times a day (BID) | INTRAMUSCULAR | Status: DC
Start: 2024-02-17 — End: 2024-02-22
  Administered 2024-02-17 – 2024-02-19 (×5): 40 mg via INTRAVENOUS
  Filled 2024-02-17 (×5): qty 1

## 2024-02-17 MED ORDER — HYDROCODONE 5 MG-ACETAMINOPHEN 325 MG TABLET
1.0000 | ORAL_TABLET | Freq: Four times a day (QID) | ORAL | Status: DC | PRN
Start: 2024-02-17 — End: 2024-02-19
  Administered 2024-02-17: 1 via ORAL
  Filled 2024-02-17: qty 1

## 2024-02-17 MED ORDER — IPRATROPIUM 0.5 MG-ALBUTEROL 3 MG (2.5 MG BASE)/3 ML NEBULIZATION SOLN
3.0000 mL | INHALATION_SOLUTION | Freq: Four times a day (QID) | RESPIRATORY_TRACT | Status: DC
Start: 2024-02-17 — End: 2024-02-18
  Administered 2024-02-17 (×3): 3 mL via RESPIRATORY_TRACT
  Administered 2024-02-18: 0 mL via RESPIRATORY_TRACT
  Administered 2024-02-18: 3 mL via RESPIRATORY_TRACT
  Administered 2024-02-18: 0 mL via RESPIRATORY_TRACT

## 2024-02-17 NOTE — H&P (Signed)
 [facility]     New Patient Psychiatric Admission     Patient's Full Name: Misty Johnson   Patient's Date of Birth: Mar 15, 1951   Patient's Age: 73 y.o.   Patient's Legal Sex: female   Patient's MRN: [id number]   Patient's Date of Admission: 02/14/2024   Current Date: 02/17/2024 12:45     Patient's Room/Bed: 322/A     Legal Status: Voluntary      Chief Complaint: Hallucinations      History of Present Illness: Patient on med-surg floor for COPD exacerbation. Reported to staff that she thought I saw a man last night and yesterday saw some kids playing on the hamper. Patient denies hallucinations at home. Per chart- had similar incident during last hospital stay. No medications were given. Patient describes the recent visual hallucinations as silly and denies them causing her worry. Paitent denies auditory and tactile hallucinations.        Psychiatric History:    Past Diagnoses: Depression and anxiety     Treatment History:   History of Inpatient Psychiatric Treatment? N  History of Outpatient Psychiatric Treatment? N  History of detox/rehab? N  History of ECT? N  History of IOP/PHP? N  History Clozaril Treatment? 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 N    Medications and Allergies:    Allergies:  Allergies[1]       Past Psychiatric Medications: Lexapro  for a long time     Current Psychiatric Medications: Lexapro     Social History:    Living Situation: Home with Son    Marital Status: Widowed    Highest Level of Education: HS    State of Employment: Retired    Animal nutritionist History:N    Abuse History:N    Other Social History: Supportive daughter as well.    Vital Signs:    Filed Vitals:    02/17/24 0800 02/17/24 0859 02/17/24 1029 02/17/24 1137   BP:  (!) 128/47  (!) (P) 128/58   Pulse:  59 56 (P) 63   Resp:    (P) 18   Temp:    (P) 37.2 C (98.9 F)   SpO2: 99%  98%         Labs:    No results found for this  or any previous visit (from the past 24 hours).     Physical Exam:     Please see dictated hospitalist note    Current Facility-Administered Medications   Medication Dose Route Frequency    aspirin  chewable tablet 81 mg  81 mg Oral Daily    atorvastatin  (LIPITOR) tablet  40 mg Oral QPM    budesonide  (PULMICORT  RESPULES) 0.5 mg/2 mL nebulizer suspension  1 mg Nebulization 2x/day    doxycycline  tablet  100 mg Oral 2x/day    enoxaparin  PF (LOVENOX ) 40 mg/0.4 mL SubQ injection  40 mg Subcutaneous Daily    escitalopram  (LEXAPRO ) tablet  20 mg Oral Daily    furosemide  (LASIX ) tablet  40 mg Oral Daily    guaiFENesin  100mg  per 5mL oral liquid - for cough (expectorant)  200 mg Oral Q4H PRN    ipratropium-albuterol  0.5 mg-3 mg(2.5 mg base)/3 mL Solution for Nebulization  3 mL Nebulization Q6H    loratadine  (CLARITIN ) tablet  10 mg Oral Daily    metoprolol  succinate (TOPROL -XL) 24 hr extended release tablet  25 mg Oral Daily    nicotine  (NICODERM CQ ) transdermal patch (mg/24 hr)  21 mg Transdermal Daily    NS flush syringe  3 mL Intracatheter Q8HRS    NS flush syringe  3 mL Intracatheter Q1H PRN    pantoprazole  (PROTONIX ) delayed release tablet  40 mg Oral Daily    polyethylene glycol (MIRALAX ) oral packet  34 g Oral  2x/day    potassium chloride  (K-DUR) extended release tablet  20 mEq Oral Daily    predniSONE  (DELTASONE ) tablet  40 mg Oral Daily with Breakfast    ramelteon  (ROZEREM ) tablet  8 mg Oral NIGHTLY    sennosides-docusate sodium  (SENOKOT-S) 8.6-50mg  per tablet  2 Tablet Oral 2x/day        Mental Status Examination:    Sensorium/Alertness: Alert, Awake,   Orientation:Person, Place, Situation  Appearance:Appears stated age  Psychomotor Activity: Normal,   Abnormal Behaviors: None,   Attitude Towards Examiner: Attentive, Cooperative  Eye Contact: Normal,  Speech: Normal/Spontaneous, Mumbled,   Mood:  Ok  Affect: Appropriate  Perception: Hallucinations Visual,   Though Process: Logical/Clear/Goal Oriented,  Thought Content: Suicidal? N  Thought Content: Homicidal? N  Thought Content: Delusions? N  Impulse Control: Within Normal Limits,   Concentration/Calculation/Attention Span: WNL,   How was the patient's Concentration/Calculation/Attention tested/assessed? Per observation and interview with patient   Recent Memory: WNL,   Remote Memory: WNL,  How was the patient's Remote Memory Tested/Assessed? Past Events, as it relates to history  Intelligence/Fund of Knowledge: Average  How was the patient's Intelligence/Fund of Knowledge Tested/Assessed? Based on history, Based on vocabulary, syntax, grammar, and content  Judgement: Fair,   How was the patient's Judgement Tested/Assessed? Per patient's behavior/history of present illness  Insight: Fair,   How was the patient's Insight Tested/Assessed? Understanding of severity of illness/history of present illness     Suicide Risk Assessment:    Suicide Risk Factors:  none    Suicide Protective Factors:  social support)    Suicide Other Protective Factors:    Ideation (frequency, intensity, duration): DENIES    Plan (timing, location, lethality, availability, preparatory acts) DENIES    Behaviors (past attempts, aborted attempts, rehearsals vs. Non suicidal self-injurious behavior)  DENIES    Intent (extent to which patient expects to carry out plan and believes the plan/act to be lethal vs self injurious) DENIES    Homicide Risk Assessment:    Homicide Risk Factors:  Homicide Protective Factors:(therapeutic relationships, social support,    Strength and Limitations:    Patient Strengths: (able to vocalize needsy; interpersonal relationships and supports available-family, r    Patient Limitations: lack of motivation no interests; )    Discussed with family/guardian/significant other:      Clinical Conclusion:    Overall Impression: Patient is 73 year old female being treated for COPD exacerbation. Two reported instances of visual hallucinations. No current distress. Calm and cooperative. Open to taking PRN if symptoms persist/worsen. Denies depression. Denies anxiety. Slept better last night after taking Rozerem .     Diagnoses:   Active Hospital Problems    Diagnosis    Acute exacerbation of chronic obstructive pulmonary disease (COPD) (CMS HCC) [J44.1]       Differential Diagnoses: Dementia    Treatment and Medication Recommendation:  -The patient will be admitted for stabilization.  Medicines will be instituted and adjusted.  We have discussed medicine side-effects, treatment options, and pregnancy precautions if applicable.  The patient will be encouraged to be active in groups and milieu treatment.  Patient voices understanding and agrees with the plan.  Patient aware of unit rules and regulations and will be seen by the hospitalist for medical issues.  Further changes will be dictated by clinical course.   I estimate patient length of stay will be 3-5 days  -  -regular diet, full code  -provisional discharge disposition: Home with son      8/16; Zyprexa  Zydis 5mg  PRN for agitation/hallucinations.     Portions of this evaluation were not assessed due to the following:        I certify that the inpatient psychiatric admission is medically necessary for (1) treatment which is reasonably  expected to improve the patient's condition, and/or (2) for diagnostic studies    Interaction Attestation: Clinical telemedicine services delivered using HIPAA-compliant interactive video-audio telecommunications while the patient and the rendering provider were not in the same physical location. Patient agreeable to telecommunication.    TELEMEDICINE DOCUMENTATION:    Patient Location:  [facility], [address], [place], [place] [address]  Provider Location: Remote  Patient/family aware of provider location:  yes  Patient/family consent for telemedicine:  yes  Examination observed and performed by:  Evalene Donath, APRN,PMHNP-BC      Jhovanny Guinta, APRN,PMHNP-BC            [1]   Allergies  Allergen Reactions    Hydrocodone  Nausea/ Vomiting

## 2024-02-17 NOTE — Progress Notes (Signed)
 [facility]               IP PROGRESS NOTE      Keishana, Klinger  Date of Admission:  02/14/2024  Date of Birth:  [birth year]  Date of Service:  02/17/2024    Hospital Day:  LOS: 0 days     Subjective:   Misty Johnson is a 73 y/o female who is being seen in f/u for COPDAE. Patient states she is still wheezing but feels she is breathing some better. Currently on 2.5 L O2 via NC.  Uses 3L at home.  Wants her daughter to come visit her today and has asked if I could call her.  No other complaints voiced.     02/16/2024 Patient seen and examined at bedside.  States she wants to go home. Still wheezing.  Has not walked with PT.   On 3L O2 .     02/17/2024   Patient seen and examined at bedside. Nursing reports intermittent confusion , patient seeing objects that aren't there.  Will have psych evaluate.   Remains on 3L O2. Still wheezing. Lost IV site yesterday . On PO steroids now but will likely switch back to IV.       Vital Signs:  Temp (24hrs) Max:37.2 C (98.9 F)      Temperature: (P) 37.2 C (98.9 F)  BP (Non-Invasive): (!) (P) 128/58  MAP (Non-Invasive): 71 mmHG  Heart Rate: (P) 63  Respiratory Rate: (P) 18  SpO2: 98 %    Current Medications:  aspirin  chewable tablet 81 mg, 81 mg, Oral, Daily  atorvastatin  (LIPITOR) tablet, 40 mg, Oral, QPM  budesonide  (PULMICORT  RESPULES) 0.5 mg/2 mL nebulizer suspension, 1 mg, Nebulization, 2x/day  doxycycline  tablet, 100 mg, Oral, 2x/day  enoxaparin  PF (LOVENOX ) 40 mg/0.4 mL SubQ injection, 40 mg, Subcutaneous, Daily  escitalopram  (LEXAPRO ) tablet, 20 mg, Oral, Daily  furosemide  (LASIX ) tablet, 40 mg, Oral, Daily  guaiFENesin  100mg  per 5mL oral liquid - for cough (expectorant), 200 mg, Oral, Q4H PRN  ipratropium-albuterol  0.5 mg-3 mg(2.5 mg base)/3 mL Solution for Nebulization, 3 mL, Nebulization, Q6H  loratadine  (CLARITIN ) tablet, 10 mg, Oral, Daily  metoprolol  succinate (TOPROL -XL) 24 hr extended release tablet, 25 mg, Oral, Daily  nicotine  (NICODERM CQ )  transdermal patch (mg/24 hr), 21 mg, Transdermal, Daily  NS flush syringe, 3 mL, Intracatheter, Q8HRS  NS flush syringe, 3 mL, Intracatheter, Q1H PRN  pantoprazole  (PROTONIX ) delayed release tablet, 40 mg, Oral, Daily  polyethylene glycol (MIRALAX ) oral packet, 34 g, Oral, 2x/day  potassium chloride  (K-DUR) extended release tablet, 20 mEq, Oral, Daily  predniSONE  (DELTASONE ) tablet, 40 mg, Oral, Daily with Breakfast  ramelteon  (ROZEREM ) tablet, 8 mg, Oral, NIGHTLY  sennosides-docusate sodium  (SENOKOT-S) 8.6-50mg  per tablet, 2 Tablet, Oral, 2x/day        Current Orders:  Active Orders   Diet    DIET CARDIAC (2G NA, LOWFAT, LOW CHOL) Do you want to initiate MNT Protocol? Yes     Frequency: All Meals     Number of Occurrences: 1 Occurrences   Nursing    ACTIVITY     Frequency: UNTIL DISCONTINUED     Number of Occurrences: Until Specified    CONTINUOUS CARDIAC MONITORING (ED USE ONLY)     Frequency: ONE TIME     Number of Occurrences: 1 Occurrences    INCENTIVE SPIROMETRY NURSING     Frequency: Q1H WA     Number of Occurrences: 250 Occurrences    PULSE OXIMETRY CONTINUOUS  Frequency: CONTINUOUS     Number of Occurrences: Until Specified   Code Status    FULL CODE: ATTEMPT RESUSCITATION / CPR     Frequency: CONTINUOUS     Number of Occurrences: Until Specified     Order Comments: Patient wishes for full ICU level care including advanced airway interventions / mechanical ventilation.     In the event of pulseless cardiac arrest, patient consents to ACLS (advanced cardiac life support) to attempt resuscitation.  IE - Consents to chest compressions, life support including intubation, mechanical ventilation, defibrillation/cardioversion as indicated.         Consult    IP CONSULT TO PALLIATIVE CARE     Frequency: ONE TIME     Number of Occurrences: 1 Occurrences    IP CONSULT TO PSYCHIATRY On-Call Provider (nurse/clerk to determine)     Frequency: ONE TIME     Number of Occurrences: 1 Occurrences   PT    PT EVALUATE AND  TREAT     Frequency: Per Therapist Discretion     Number of Occurrences: 1 Occurrences     Order Comments: If patient's O2 level drops, PT may increase O2 until sats are > 93%.       Scheduling Instructions:               Respiratory Care    ACCUPAP SYSTEM     Frequency: Q4H WHILE AWAKE     Number of Occurrences: Until Specified    INCENTIVE SPIROMETRY - RT INSTRUCT     Frequency: ONE TIME     Number of Occurrences: 1 Occurrences    OXYGEN - NASAL CANNULA     Frequency: CONTINUOUS     Number of Occurrences: Until Specified     Order Comments: Flowrate should not exeed 6L/min except [facility] facility.          IV    INSERT & MAINTAIN PERIPHERAL IV ACCESS     Frequency: UNTIL DISCONTINUED     Number of Occurrences: Until Specified    INSERT & MAINTAIN PERIPHERAL IV ACCESS     Frequency: UNTIL DISCONTINUED     Number of Occurrences: Until Specified    PERIPHERAL IV DRESSING CHANGE     Frequency: PRN     Number of Occurrences: Until Specified   Admission    PATIENT CLASS/LEVEL OF CARE DESIGNATION - PRN     Frequency: ONE TIME     Number of Occurrences: 1 Occurrences     Order Comments: I certify hospital services are required for this patient based on their diagnosis, age, co-morbidities, and risk of adverse event if discharged.  All hospital services provided to the patient will be in accordance to 42 CFR 412.3.  Information regarding the patients diagnosis, testing, and treatment may be found in the H&P and subsequent progress notes.       Medications    aspirin  chewable tablet 81 mg     Frequency: Daily     Dose: 81 mg     Route: Oral    atorvastatin  (LIPITOR) tablet     Frequency: QPM     Dose: 40 mg     Route: Oral    budesonide  (PULMICORT  RESPULES) 0.5 mg/2 mL nebulizer suspension     Frequency: 2x/day     Dose: 1 mg     Route: Nebulization    doxycycline  tablet     Frequency: 2x/day     Dose: 100 mg     Route: Oral  enoxaparin  PF (LOVENOX ) 40 mg/0.4 mL SubQ injection     Frequency: Daily     Dose: 40 mg     Route:  Subcutaneous    escitalopram  (LEXAPRO ) tablet     Frequency: Daily     Dose: 20 mg     Route: Oral    furosemide  (LASIX ) tablet     Frequency: Daily     Dose: 40 mg     Route: Oral    guaiFENesin  100mg  per 5mL oral liquid - for cough (expectorant)     Frequency: Q4H PRN     Dose: 200 mg     Route: Oral    ipratropium-albuterol  0.5 mg-3 mg(2.5 mg base)/3 mL Solution for Nebulization     Frequency: Q6H     Dose: 3 mL     Route: Nebulization    loratadine  (CLARITIN ) tablet     Frequency: Daily     Dose: 10 mg     Route: Oral    metoprolol  succinate (TOPROL -XL) 24 hr extended release tablet     Frequency: Daily     Dose: 25 mg     Route: Oral    nicotine  (NICODERM CQ ) transdermal patch (mg/24 hr)     Frequency: Daily     Dose: 21 mg     Route: Transdermal    NS flush syringe     Frequency: Q8HRS     Dose: 3 mL     Route: Intracatheter    NS flush syringe     Frequency: Q1H PRN     Dose: 3 mL     Route: Intracatheter    pantoprazole  (PROTONIX ) delayed release tablet     Frequency: Daily     Dose: 40 mg     Route: Oral    polyethylene glycol (MIRALAX ) oral packet     Frequency: 2x/day     Dose: 34 g     Route: Oral    potassium chloride  (K-DUR) extended release tablet     Frequency: Daily     Dose: 20 mEq     Route: Oral    predniSONE  (DELTASONE ) tablet     Frequency: Daily with Breakfast     Dose: 40 mg     Route: Oral    ramelteon  (ROZEREM ) tablet     Frequency: NIGHTLY     Dose: 8 mg     Route: Oral    sennosides-docusate sodium  (SENOKOT-S) 8.6-50mg  per tablet     Frequency: 2x/day     Dose: 2 Tablet     Route: Oral        Review of Systems:  Focused review of system was completed. Refer to the HPI for ROS details.     Today's Physical Exam:  Physical Exam  Constitutional:       General: She is not in acute distress.     Appearance: She is not ill-appearing.   HENT:      Head: Normocephalic and atraumatic.   Cardiovascular:      Rate and Rhythm: Normal rate and regular rhythm.   Pulmonary:      Effort: Pulmonary effort  is normal.      Breath sounds: Wheezing present.   Abdominal:      General: Bowel sounds are normal.      Palpations: Abdomen is soft.   Musculoskeletal:      Right lower leg: No edema.      Left lower leg: No edema.   Skin:  General: Skin is warm.   Neurological:      General: No focal deficit present.      Mental Status: She is alert and oriented to person, place, and time.   Psychiatric:         Mood and Affect: Mood normal.         Thought Content: Thought content normal.         Judgment: Judgment normal.        I/O:  I/O last 24 hours:    Intake/Output Summary (Last 24 hours) at 02/17/2024 1255  Last data filed at 02/17/2024 0600  Gross per 24 hour   Intake 140 ml   Output 500 ml   Net -360 ml     I/O current shift:  No intake/output data recorded.  Labs:  Reviewed: I have reviewed all lab results.  Lab Results Today:    No results found for any visits on 02/14/24 (from the past 24 hours).    Micro Results: No results found for any visits on 02/14/24 (from the past 24 hours).  Images:   No results found.     CT ANGIO CHEST W IV CONTRAST   Final Result   1. NO PULMONARY EMBOLUS   2. BILATERAL BRONCHIAL WALL THICKENING   3. BILATERAL ATELECTASIS.               Radiologist location ID: [id number]         XR AP MOBILE CHEST   Final Result   NO ACUTE FINDINGS.            Radiologist location ID: [id number]              Problem List:  Active Hospital Problems   (*Primary Problem)    Diagnosis    *Acute exacerbation of chronic obstructive pulmonary disease (COPD) (CMS HCC)       Nutrition:    DIET CARDIAC (2G NA, LOWFAT, LOW CHOL) Do you want to initiate MNT Protocol? Yes    Additional clinical characteristics related to nutrition:    - monitor for weight changes   - monitor intake and output    - monitor bowel functions                   Assessment/ Plan:   Acute exacerbation of COPD  Chronic hypoxic respiratory failure   -po doxycycline    -Prednisone  40 mg discontinued and IV restarted due to wheezing    -DuoNebs   -incentive spirometry PRN  -supplemental O2 titration PRN  -Was unsteady walking with nursing yesterday. Desatted into the 80's on 3L.  Await PT eval.  - Daughter is wanting CM to call her on Monday concerning home health and other resources to help mother bath, fix meals.   -OOB to chair     Major Depression   -restart home Lexapro    Nursing reports that patient is seeing things and is intermittently confused.  Spoke with daughter who states this occurs whenever she is in the hospital. She is fine at home.  Zyprexa  5mg  daily as needed started.   Psych consult for hallucinations.    -further treatment adjustments will be made based off clinical course.  See orders for further details.  -Hospitalist personally evaluated and examined the patient in conjunction with the MLP and agree with the assessments, treatment plan and disposition of the patient as recorded by the [facility].       DVT/PE Prophylaxis: Lovenox   Joleen Stuckert D Chas Axel, PA-C

## 2024-02-17 NOTE — Care Plan (Signed)
 [facility]  Care Plan Note    Situation: Acute COPD exacerbation    Intervention: Patient alert but intermittently confused. Patient daughter reports this happens when she is in the hospital. Patient denies pain this shift. Patient not coughing as much. Patient oxygen saturations drop to 84% on room air. Patient removes oxygen while sleeping at times. Patient currently on 4 L NC and SPO2 100%. Patient medicated with ramelteon . Fall precautions in place. Patient incontinent at times and incontinence care provided.      Response: Patient continues to be intermittently confused.       M'Kayla Froylan, RN          Problem: Adult Inpatient Plan of Care  Goal: Plan of Care Review  Outcome: Ongoing (see interventions/notes)  Goal: Patient-Specific Goal (Individualized)  Outcome: Ongoing (see interventions/notes)  Flowsheets (Taken 02/16/2024 1945)  Individualized Care Needs: assist with ADLs  Anxieties, Fears or Concerns: none voiced at this  Goal: Absence of Hospital-Acquired Illness or Injury  Outcome: Ongoing (see interventions/notes)  Intervention: Identify and Manage Fall Risk  Recent Flowsheet Documentation  Taken 02/16/2024 1945 by Mare ORN, RN  Safety Promotion/Fall Prevention:   activity supervised   fall prevention program maintained   motion sensor pad activated   nonskid shoes/slippers when out of bed   safety round/check completed  Intervention: Prevent Skin Injury  Recent Flowsheet Documentation  Taken 02/17/2024 0000 by Mare ORN, RN  Body Position: side lying, right  Taken 02/16/2024 2200 by M'Kayla W, RN  Body Position: supine, head elevated  Taken 02/16/2024 1945 by M'Kayla W, RN  Body Position: supine, head elevated  Skin Protection:   adhesive use limited   transparent dressing maintained   tubing/devices free from skin contact  Intervention: Prevent and Manage VTE (Venous Thromboembolism) Risk  Recent Flowsheet Documentation  Taken 02/16/2024 1945 by F'Xjboj W, RN  VTE  Prevention/Management:   dorsiflexion/plantar flexion performed   ambulation promoted  Goal: Optimal Comfort and Wellbeing  Outcome: Ongoing (see interventions/notes)  Intervention: Provide Person-Centered Care  Recent Flowsheet Documentation  Taken 02/16/2024 1945 by Mare ORN, RN  Trust Relationship/Rapport:   care explained   choices provided   questions answered   questions encouraged  Goal: Rounds/Family Conference  Outcome: Ongoing (see interventions/notes)     Problem: Fall Injury Risk  Goal: Absence of Fall and Fall-Related Injury  Outcome: Ongoing (see interventions/notes)  Intervention: Identify and Manage Contributors  Recent Flowsheet Documentation  Taken 02/16/2024 1945 by Mare ORN, RN  Self-Care Promotion:   independence encouraged   BADL personal objects within reach   BADL personal routines maintained  Medication Review/Management: medications reviewed  Intervention: Promote Injury-Free Environment  Recent Flowsheet Documentation  Taken 02/16/2024 1945 by Mare ORN, RN  Safety Promotion/Fall Prevention:   activity supervised   fall prevention program maintained   motion sensor pad activated   nonskid shoes/slippers when out of bed   safety round/check completed     Problem: Gas Exchange Impaired  Goal: Optimal Gas Exchange  Outcome: Ongoing (see interventions/notes)  Intervention: Optimize Oxygenation and Ventilation  Recent Flowsheet Documentation  Taken 02/16/2024 1945 by Mare ORN, RN  Head of Bed (HOB) Positioning: HOB at 30-45 degrees     Problem: Skin Injury Risk Increased  Goal: Skin Health and Integrity  Outcome: Ongoing (see interventions/notes)  Intervention: Optimize Skin Protection  Recent Flowsheet Documentation  Taken 02/16/2024 1945 by M'Kayla W, RN  Pressure Reduction Techniques:   Mobility is maximized   Moisture, shear and  nutrition are maximized  Skin Protection:   adhesive use limited   transparent dressing maintained   tubing/devices free from skin contact  Activity Management: ROM,  active encouraged  Head of Bed (HOB) Positioning: HOB at 30-45 degrees

## 2024-02-18 LAB — SCAN DIFFERENTIAL
PLATELET MORPHOLOGY COMMENT: NORMAL
RBC MORPHOLOGY COMMENT: NORMAL
SCHISTOCYTES: ABSENT

## 2024-02-18 LAB — COMPREHENSIVE METABOLIC PANEL, NON-FASTING
ALBUMIN/GLOBULIN RATIO: 1.7 — ABNORMAL HIGH (ref 0.8–1.4)
ALBUMIN: 3.7 g/dL (ref 3.5–5.7)
ALKALINE PHOSPHATASE: 41 U/L (ref 34–104)
ALT (SGPT): 7 U/L (ref 7–52)
ANION GAP: 4 mmol/L (ref 4–13)
AST (SGOT): 13 U/L (ref 13–39)
BILIRUBIN TOTAL: 0.6 mg/dL (ref 0.3–1.0)
BUN/CREA RATIO: 40 — ABNORMAL HIGH (ref 6–22)
BUN: 26 mg/dL — ABNORMAL HIGH (ref 7–25)
CALCIUM, CORRECTED: 9.3 mg/dL (ref 8.9–10.8)
CALCIUM: 9.1 mg/dL (ref 8.6–10.3)
CHLORIDE: 102 mmol/L (ref 98–107)
CO2 TOTAL: 34 mmol/L — ABNORMAL HIGH (ref 21–31)
CREATININE: 0.65 mg/dL (ref 0.60–1.30)
ESTIMATED GFR: 92 mL/min/1.73mˆ2 (ref >59)
GLOBULIN: 2.2 (ref 2.0–3.5)
GLUCOSE: 183 mg/dL — ABNORMAL HIGH (ref 74–109)
OSMOLALITY, CALCULATED: 289 mosm/kg (ref 270–290)
POTASSIUM: 4.9 mmol/L (ref 3.5–5.1)
PROTEIN TOTAL: 5.9 g/dL — ABNORMAL LOW (ref 6.4–8.9)
SODIUM: 140 mmol/L (ref 136–145)

## 2024-02-18 LAB — CBC WITH DIFF
BASOPHIL #: 0 x10ˆ3/uL (ref 0.00–0.10)
BASOPHIL %: 0 % (ref 0–1)
EOSINOPHIL #: 0 x10ˆ3/uL (ref 0.00–0.50)
EOSINOPHIL %: 0 % — ABNORMAL LOW (ref 1–7)
HCT: 32.4 % (ref 31.2–41.9)
HGB: 10.4 g/dL — ABNORMAL LOW (ref 10.9–14.3)
LYMPHOCYTE #: 0.4 x10ˆ3/uL — ABNORMAL LOW (ref 1.10–3.10)
LYMPHOCYTE %: 4 % — ABNORMAL LOW (ref 16–46)
MCH: 25.6 pg (ref 24.7–32.8)
MCHC: 32 g/dL — ABNORMAL LOW (ref 32.3–35.6)
MCV: 80.1 fL (ref 75.5–95.3)
MONOCYTE #: 0.2 x10ˆ3/uL (ref 0.20–0.90)
MONOCYTE %: 3 % — ABNORMAL LOW (ref 4–11)
MPV: 9.4 fL (ref 7.9–10.8)
NEUTROPHIL #: 8 x10ˆ3/uL (ref 1.90–8.20)
NEUTROPHIL %: 93 % — ABNORMAL HIGH (ref 43–77)
PLATELETS: 148 x10ˆ3/uL (ref 140–440)
RBC: 4.04 x10ˆ6/uL (ref 3.63–4.92)
RDW: 18.1 % — ABNORMAL HIGH (ref 12.3–17.7)
WBC: 8.5 x10ˆ3/uL (ref 3.8–11.8)

## 2024-02-18 LAB — MAGNESIUM: MAGNESIUM: 2 mg/dL (ref 1.9–2.7)

## 2024-02-18 MED ORDER — IPRATROPIUM 0.5 MG-ALBUTEROL 3 MG (2.5 MG BASE)/3 ML NEBULIZATION SOLN
3.0000 mL | INHALATION_SOLUTION | Freq: Four times a day (QID) | RESPIRATORY_TRACT | Status: DC
Start: 2024-02-18 — End: 2024-02-19
  Administered 2024-02-18 – 2024-02-19 (×3): 3 mL via RESPIRATORY_TRACT

## 2024-02-18 NOTE — Progress Notes (Signed)
 [facility]               IP PROGRESS NOTE      Keeana, Pieratt  Date of Admission:  02/14/2024  Date of Birth:  May 26, 1951  Date of Service:  02/18/2024    Hospital Day:  LOS: 0 days     Subjective:   Misty Johnson is a 73 y/o female who is being seen in f/u for COPDAE. Patient states she is still wheezing but feels she is breathing some better. Currently on 2.5 L O2 via NC.  Uses 3L at home.  Wants her daughter to come visit her today and has asked if I could call her.  No other complaints voiced.     02/16/2024 Patient seen and examined at bedside.  States she wants to go home. Still wheezing.  Has not walked with PT.   On 3L O2 .     02/17/2024   Patient seen and examined at bedside. Nursing reports intermittent confusion , patient seeing objects that aren't there.  Will have psych evaluate.   Remains on 3L O2. Still wheezing. Lost IV site yesterday . On PO steroids now but will likely switch back to IV.     02/18/2024  Patient seen and examined at bedside. States she wants to go home. Has not been up today.  Wheezing less.  Looks more comfortable. Patient slept well.  No hallucinations throughout the night.       Vital Signs:  Temp (24hrs) Max:37.2 C (98.9 F)      Temperature: 36.2 C (97.1 F)  BP (Non-Invasive): (!) 129/47  MAP (Non-Invasive): 71 mmHG  Heart Rate: 65  Respiratory Rate: 20  SpO2: 98 %    Current Medications:  aspirin  chewable tablet 81 mg, 81 mg, Oral, Daily  atorvastatin  (LIPITOR) tablet, 40 mg, Oral, QPM  budesonide  (PULMICORT  RESPULES) 0.5 mg/2 mL nebulizer suspension, 1 mg, Nebulization, 2x/day  doxycycline  tablet, 100 mg, Oral, 2x/day  enoxaparin  PF (LOVENOX ) 40 mg/0.4 mL SubQ injection, 40 mg, Subcutaneous, Daily  escitalopram  (LEXAPRO ) tablet, 20 mg, Oral, Daily  furosemide  (LASIX ) tablet, 40 mg, Oral, Daily  guaiFENesin  100mg  per 5mL oral liquid - for cough (expectorant), 200 mg, Oral, Q4H PRN  HYDROcodone -acetaminophen  (NORCO) 5-325 mg per tablet, 1 Tablet, Oral, Q6H  PRN  ipratropium-albuterol  0.5 mg-3 mg(2.5 mg base)/3 mL Solution for Nebulization, 3 mL, Nebulization, Q6H  loratadine  (CLARITIN ) tablet, 10 mg, Oral, Daily  methylPREDNISolone  sod succ (SOLU-medrol ) 40 mg/mL injection, 40 mg, Intravenous, Q12H  metoprolol  succinate (TOPROL -XL) 24 hr extended release tablet, 12.5 mg, Oral, Daily  nicotine  (NICODERM CQ ) transdermal patch (mg/24 hr), 21 mg, Transdermal, Daily  NS flush syringe, 3 mL, Intracatheter, Q8HRS  NS flush syringe, 3 mL, Intracatheter, Q1H PRN  OLANZapine  (zyPREXA  ZYDIS) rapid dissolve tablet, 5 mg, Oral, Daily PRN  pantoprazole  (PROTONIX ) delayed release tablet, 40 mg, Oral, Daily  polyethylene glycol (MIRALAX ) oral packet, 34 g, Oral, 2x/day  potassium chloride  (K-DUR) extended release tablet, 20 mEq, Oral, Daily  ramelteon  (ROZEREM ) tablet, 8 mg, Oral, NIGHTLY  sennosides-docusate sodium  (SENOKOT-S) 8.6-50mg  per tablet, 2 Tablet, Oral, 2x/day        Current Orders:  Active Orders   Diet    DIET CARDIAC (2G NA, LOWFAT, LOW CHOL) Do you want to initiate MNT Protocol? Yes     Frequency: All Meals     Number of Occurrences: 1 Occurrences   Nursing    ACTIVITY     Frequency: UNTIL DISCONTINUED  Number of Occurrences: Until Specified    CONTINUOUS CARDIAC MONITORING (ED USE ONLY)     Frequency: ONE TIME     Number of Occurrences: 1 Occurrences    INCENTIVE SPIROMETRY NURSING     Frequency: Q1H WA     Number of Occurrences: 250 Occurrences    PULSE OXIMETRY CONTINUOUS     Frequency: CONTINUOUS     Number of Occurrences: Until Specified   Code Status    FULL CODE: ATTEMPT RESUSCITATION / CPR     Frequency: CONTINUOUS     Number of Occurrences: Until Specified     Order Comments: Patient wishes for full ICU level care including advanced airway interventions / mechanical ventilation.     In the event of pulseless cardiac arrest, patient consents to ACLS (advanced cardiac life support) to attempt resuscitation.  IE - Consents to chest compressions, life support  including intubation, mechanical ventilation, defibrillation/cardioversion as indicated.         Consult    IP CONSULT TO PALLIATIVE CARE     Frequency: ONE TIME     Number of Occurrences: 1 Occurrences    IP CONSULT TO PSYCHIATRY On-Call Provider (nurse/clerk to determine)     Frequency: ONE TIME     Number of Occurrences: 1 Occurrences   PT    PT EVALUATE AND TREAT     Frequency: Per Therapist Discretion     Number of Occurrences: 1 Occurrences     Order Comments: If patient's O2 level drops, PT may increase O2 until sats are > 93%.       Scheduling Instructions:               Respiratory Care    ACCUPAP SYSTEM     Frequency: Q4H WHILE AWAKE     Number of Occurrences: Until Specified    INCENTIVE SPIROMETRY - RT INSTRUCT     Frequency: ONE TIME     Number of Occurrences: 1 Occurrences    OXYGEN - NASAL CANNULA     Frequency: CONTINUOUS     Number of Occurrences: Until Specified     Order Comments: Flowrate should not exeed 6L/min except [facility] facility.          IV    INSERT & MAINTAIN PERIPHERAL IV ACCESS     Frequency: UNTIL DISCONTINUED     Number of Occurrences: Until Specified    INSERT & MAINTAIN PERIPHERAL IV ACCESS     Frequency: UNTIL DISCONTINUED     Number of Occurrences: Until Specified    PERIPHERAL IV DRESSING CHANGE     Frequency: PRN     Number of Occurrences: Until Specified   Admission    PATIENT CLASS/LEVEL OF CARE DESIGNATION - PRN     Frequency: ONE TIME     Number of Occurrences: 1 Occurrences     Order Comments: I certify hospital services are required for this patient based on their diagnosis, age, co-morbidities, and risk of adverse event if discharged.  All hospital services provided to the patient will be in accordance to 42 CFR 412.3.  Information regarding the patients diagnosis, testing, and treatment may be found in the H&P and subsequent progress notes.       Medications    aspirin  chewable tablet 81 mg     Frequency: Daily     Dose: 81 mg     Route: Oral    atorvastatin  (LIPITOR)  tablet     Frequency: QPM     Dose:  40 mg     Route: Oral    budesonide  (PULMICORT  RESPULES) 0.5 mg/2 mL nebulizer suspension     Frequency: 2x/day     Dose: 1 mg     Route: Nebulization    doxycycline  tablet     Frequency: 2x/day     Dose: 100 mg     Route: Oral    enoxaparin  PF (LOVENOX ) 40 mg/0.4 mL SubQ injection     Frequency: Daily     Dose: 40 mg     Route: Subcutaneous    escitalopram  (LEXAPRO ) tablet     Frequency: Daily     Dose: 20 mg     Route: Oral    furosemide  (LASIX ) tablet     Frequency: Daily     Dose: 40 mg     Route: Oral    guaiFENesin  100mg  per 5mL oral liquid - for cough (expectorant)     Frequency: Q4H PRN     Dose: 200 mg     Route: Oral    HYDROcodone -acetaminophen  (NORCO) 5-325 mg per tablet     Frequency: Q6H PRN     Dose: 1 Tablet     Route: Oral    ipratropium-albuterol  0.5 mg-3 mg(2.5 mg base)/3 mL Solution for Nebulization     Frequency: Q6H     Dose: 3 mL     Route: Nebulization    loratadine  (CLARITIN ) tablet     Frequency: Daily     Dose: 10 mg     Route: Oral    methylPREDNISolone  sod succ (SOLU-medrol ) 40 mg/mL injection     Frequency: Q12H     Dose: 40 mg     Route: Intravenous    metoprolol  succinate (TOPROL -XL) 24 hr extended release tablet     Frequency: Daily     Dose: 12.5 mg     Route: Oral    nicotine  (NICODERM CQ ) transdermal patch (mg/24 hr)     Frequency: Daily     Dose: 21 mg     Route: Transdermal    NS flush syringe     Frequency: Q8HRS     Dose: 3 mL     Route: Intracatheter    NS flush syringe     Frequency: Q1H PRN     Dose: 3 mL     Route: Intracatheter    OLANZapine  (zyPREXA  ZYDIS) rapid dissolve tablet     Frequency: Daily PRN     Dose: 5 mg     Route: Oral    pantoprazole  (PROTONIX ) delayed release tablet     Frequency: Daily     Dose: 40 mg     Route: Oral    polyethylene glycol (MIRALAX ) oral packet     Frequency: 2x/day     Dose: 34 g     Route: Oral    potassium chloride  (K-DUR) extended release tablet     Frequency: Daily     Dose: 20 mEq     Route: Oral     ramelteon  (ROZEREM ) tablet     Frequency: NIGHTLY     Dose: 8 mg     Route: Oral    sennosides-docusate sodium  (SENOKOT-S) 8.6-50mg  per tablet     Frequency: 2x/day     Dose: 2 Tablet     Route: Oral        Review of Systems:  Focused review of system was completed. Refer to the HPI for ROS details.     Today's Physical Exam:  Physical Exam  Constitutional:       General: She is not in acute distress.     Appearance: She is not ill-appearing.   HENT:      Head: Normocephalic and atraumatic.   Cardiovascular:      Rate and Rhythm: Normal rate and regular rhythm.   Pulmonary:      Effort: Pulmonary effort is normal.      Breath sounds: Wheezing present.   Abdominal:      General: Bowel sounds are normal.      Palpations: Abdomen is soft.   Musculoskeletal:      Right lower leg: No edema.      Left lower leg: No edema.   Skin:     General: Skin is warm.   Neurological:      General: No focal deficit present.      Mental Status: She is alert and oriented to person, place, and time.   Psychiatric:         Mood and Affect: Mood normal.         Thought Content: Thought content normal.         Judgment: Judgment normal.        I/O:  I/O last 24 hours:    Intake/Output Summary (Last 24 hours) at 02/18/2024 1020  Last data filed at 02/17/2024 2019  Gross per 24 hour   Intake 120 ml   Output 300 ml   Net -180 ml     I/O current shift:  No intake/output data recorded.  Labs:  Reviewed: I have reviewed all lab results.  Lab Results Today:    Results for orders placed or performed during the hospital encounter of 02/14/24 (from the past 24 hours)   COMPREHENSIVE METABOLIC PANEL, NON-FASTING   Result Value Ref Range    SODIUM 140 136 - 145 mmol/L    POTASSIUM 4.9 3.5 - 5.1 mmol/L    CHLORIDE 102 98 - 107 mmol/L    CO2 TOTAL 34 (H) 21 - 31 mmol/L    ANION GAP 4 4 - 13 mmol/L    BUN 26 (H) 7 - 25 mg/dL    CREATININE 9.34 9.39 - 1.30 mg/dL    BUN/CREA RATIO 40 (H) 6 - 22    ESTIMATED GFR 92 >59 mL/min/1.55m^2    ALBUMIN 3.7 3.5 -  5.7 g/dL    CALCIUM  9.1 8.6 - 10.3 mg/dL    GLUCOSE 816 (H) 74 - 109 mg/dL    ALKALINE PHOSPHATASE 41 34 - 104 U/L    ALT (SGPT) 7 7 - 52 U/L    AST (SGOT) 13 13 - 39 U/L    BILIRUBIN TOTAL 0.6 0.3 - 1.0 mg/dL    PROTEIN TOTAL 5.9 (L) 6.4 - 8.9 g/dL    ALBUMIN/GLOBULIN RATIO 1.7 (H) 0.8 - 1.4    OSMOLALITY, CALCULATED 289 270 - 290 mOsm/kg    CALCIUM , CORRECTED 9.3 8.9 - 10.8 mg/dL    GLOBULIN 2.2 2.0 - 3.5   MAGNESIUM    Result Value Ref Range    MAGNESIUM  2.0 1.9 - 2.7 mg/dL   CBC WITH DIFF   Result Value Ref Range    WBC 8.5 3.8 - 11.8 x10^3/uL    RBC 4.04 3.63 - 4.92 x10^6/uL    HGB 10.4 (L) 10.9 - 14.3 g/dL    HCT 67.5 68.7 - 58.0 %    MCV 80.1 75.5 - 95.3 fL    MCH 25.6 24.7 - 32.8 pg    MCHC 32.0 (  L) 32.3 - 35.6 g/dL    RDW 81.8 (H) 87.6 - 17.7 %    PLATELETS 148 140 - 440 x10^3/uL    MPV 9.4 7.9 - 10.8 fL    NEUTROPHIL % 93 (H) 43 - 77 %    LYMPHOCYTE % 4 (L) 16 - 46 %    MONOCYTE % 3 (L) 4 - 11 %    EOSINOPHIL % 0 (L) 1 - 7 %    BASOPHIL % 0 0 - 1 %    NEUTROPHIL # 8.00 1.90 - 8.20 x10^3/uL    LYMPHOCYTE # 0.40 (L) 1.10 - 3.10 x10^3/uL    MONOCYTE # 0.20 0.20 - 0.90 x10^3/uL    EOSINOPHIL # 0.00 0.00 - 0.50 x10^3/uL    BASOPHIL # 0.00 0.00 - 0.10 x10^3/uL   SCAN DIFFERENTIAL   Result Value Ref Range    SCHISTOCYTES Absent     RBC MORPHOLOGY COMMENT Normal     PLATELET MORPHOLOGY COMMENT Normal        Micro Results: No results found for any visits on 02/14/24 (from the past 24 hours).  Images:   No results found.     CT ANGIO CHEST W IV CONTRAST   Final Result   1. NO PULMONARY EMBOLUS   2. BILATERAL BRONCHIAL WALL THICKENING   3. BILATERAL ATELECTASIS.               Radiologist location ID: [id number]         XR AP MOBILE CHEST   Final Result   NO ACUTE FINDINGS.            Radiologist location ID: [id number]              Problem List:  Active Hospital Problems   (*Primary Problem)    Diagnosis    *Acute exacerbation of chronic obstructive pulmonary disease (COPD) (CMS HCC)       Nutrition:    DIET  CARDIAC (2G NA, LOWFAT, LOW CHOL) Do you want to initiate MNT Protocol? Yes    Additional clinical characteristics related to nutrition:    - monitor for weight changes   - monitor intake and output    - monitor bowel functions                   Assessment/ Plan:   Acute exacerbation of COPD  Chronic hypoxic respiratory failure   -po doxycycline    -Prednisone  40 mg discontinued and IV restarted due to wheezing   -DuoNebs   -incentive spirometry PRN  -supplemental O2 titration PRN  -Was unsteady walking with nursing 8/15. Desatted into the 80's on 3L.  Await PT eval.  - Daughter is wanting CM to call her on Monday concerning home health and other resources to help mother bath, fix meals.   -OOB to chair   -Await PT evaluation for d/c     Major Depression   -restart home Lexapro    No intermittent confusion or hallucinations reported last night.    -further treatment adjustments will be made based off clinical course.  See orders for further details.  -Hospitalist personally evaluated and examined the patient in conjunction with the MLP and agree with the assessments, treatment plan and disposition of the patient as recorded by the [facility].       DVT/PE Prophylaxis: Lovenox            Omunique Pederson D Deidre Carino, PA-C

## 2024-02-18 NOTE — Care Plan (Signed)
 [facility]  Care Plan Note    Situation: Acute COPD exacerbation    Intervention: Patient started back on IV steroids q12h. Patient continues on breathing treatment. Incontinence care provided through out the shift. External catheter changed. CHG complete bath performed. Frequent turns encouraged and assistance provided. Patient remains on 3 liter oxygen. Psych consult and PRN Zyprexa  ordered. Patient has not needed Zyprexa  this shift.     Response: Patient repositioned through out shift. Patient continues to have dyspnea with activity.       M'Kayla Froylan, RN          Problem: Adult Inpatient Plan of Care  Goal: Plan of Care Review  Outcome: Ongoing (see interventions/notes)  Goal: Patient-Specific Goal (Individualized)  Outcome: Ongoing (see interventions/notes)  Flowsheets (Taken 02/17/2024 1900)  Individualized Care Needs: monitor O2 status and hallucinations  Anxieties, Fears or Concerns: none voiced at this time  Goal: Absence of Hospital-Acquired Illness or Injury  Outcome: Ongoing (see interventions/notes)  Intervention: Identify and Manage Fall Risk  Recent Flowsheet Documentation  Taken 02/17/2024 1900 by Mare ORN, RN  Safety Promotion/Fall Prevention:   activity supervised   fall prevention program maintained   motion sensor pad activated   nonskid shoes/slippers when out of bed   safety round/check completed  Intervention: Prevent Skin Injury  Recent Flowsheet Documentation  Taken 02/18/2024 0200 by M'Kayla W, RN  Body Position: side lying, left  Taken 02/18/2024 0000 by M'Kayla W, RN  Body Position: supine, head elevated  Taken 02/17/2024 2000 by Mare ORN, RN  Body Position: supine, head elevated  Taken 02/17/2024 1900 by M'Kayla W, RN  Body Position: supine, head elevated  Skin Protection:   adhesive use limited   tubing/devices free from skin contact   transparent dressing maintained  Intervention: Prevent and Manage VTE (Venous Thromboembolism) Risk  Recent Flowsheet  Documentation  Taken 02/17/2024 1900 by M'Kayla W, RN  VTE Prevention/Management: dorsiflexion/plantar flexion performed  Goal: Optimal Comfort and Wellbeing  Outcome: Ongoing (see interventions/notes)  Intervention: Provide Person-Centered Care  Recent Flowsheet Documentation  Taken 02/17/2024 1900 by Mare ORN, RN  Trust Relationship/Rapport:   care explained   choices provided   questions answered   questions encouraged   thoughts/feelings acknowledged  Goal: Rounds/Family Conference  Outcome: Ongoing (see interventions/notes)     Problem: Fall Injury Risk  Goal: Absence of Fall and Fall-Related Injury  Outcome: Ongoing (see interventions/notes)  Intervention: Identify and Manage Contributors  Recent Flowsheet Documentation  Taken 02/17/2024 1900 by Mare ORN, RN  Self-Care Promotion:   BADL personal objects within reach   independence encouraged   BADL personal routines maintained  Medication Review/Management: medications reviewed  Intervention: Promote Injury-Free Environment  Recent Flowsheet Documentation  Taken 02/17/2024 1900 by Mare ORN, RN  Safety Promotion/Fall Prevention:   activity supervised   fall prevention program maintained   motion sensor pad activated   nonskid shoes/slippers when out of bed   safety round/check completed     Problem: Gas Exchange Impaired  Goal: Optimal Gas Exchange  Outcome: Ongoing (see interventions/notes)  Intervention: Optimize Oxygenation and Ventilation  Recent Flowsheet Documentation  Taken 02/17/2024 1900 by M'Kayla W, RN  Head of Bed (HOB) Positioning: HOB at 30-45 degrees     Problem: Skin Injury Risk Increased  Goal: Skin Health and Integrity  Outcome: Ongoing (see interventions/notes)  Intervention: Optimize Skin Protection  Recent Flowsheet Documentation  Taken 02/17/2024 1900 by M'Kayla W, RN  Pressure Reduction Techniques: Moisture, shear and nutrition are maximized  Skin Protection:   adhesive use limited   tubing/devices free from skin contact   transparent  dressing maintained  Activity Management: ROM, active encouraged  Head of Bed (HOB) Positioning: HOB at 30-45 degrees

## 2024-02-18 NOTE — Respiratory Therapy (Signed)
 02/18/24 2108   AccuPAP   Start Time 2111   $AccuPAP S   Status  NG   Reason Not Given Patient Refused  (pt states she has had it once today, doesnt want to do it no more today. no distress noted)   Review of Necessity  completed   With Medication? No   Stop Time 2113   Duration (minutes) 2 minutes

## 2024-02-19 LAB — ADULT ROUTINE BLOOD CULTURE, SET OF 2 BOTTLES (BACTERIA AND YEAST)
BLOOD CULTURE, ROUTINE: NO GROWTH
BLOOD CULTURE, ROUTINE: NO GROWTH

## 2024-02-19 MED ORDER — PREDNISONE 20 MG TABLET
20.0000 mg | ORAL_TABLET | Freq: Every day | ORAL | 0 refills | Status: AC
Start: 2024-02-19 — End: 2024-02-23

## 2024-02-19 NOTE — Nurses Notes (Signed)
 Resting O2 on 3 lnc was 96%, ambulated 200 ft in hall on 3 lnc, desated to 88%, recovered to 96%

## 2024-02-19 NOTE — Nurses Notes (Signed)
 Attempted to call dtr for d/c for pt, no answer and mail box was full so no msg left. Will continue to try.

## 2024-02-19 NOTE — Care Plan (Signed)
 [facility]  Care Plan Note    Situation: Acute COPD exacerbation    Intervention: Patient continues on 3 L NC. Patient diminished with expiratory wheeze. Patient voice hoarse. Patient denies any pain at this present time. Patient ambulated approximately 40 feet in hallway with standby assistance and walker. Patient report dyspnea with exertion. Incontinence care provided. Patient continues on IV steroids.     Response: Patient reports able to walk further than earlier in the day. Patient still reports feeling weak and short of breath with activity.       M'Kayla Froylan, RN          Problem: Adult Inpatient Plan of Care  Goal: Plan of Care Review  Outcome: Ongoing (see interventions/notes)  Goal: Patient-Specific Goal (Individualized)  Outcome: Ongoing (see interventions/notes)  Flowsheets (Taken 02/18/2024 1905)  Individualized Care Needs: monitor oxygen  Anxieties, Fears or Concerns: losing voice  Goal: Absence of Hospital-Acquired Illness or Injury  Outcome: Ongoing (see interventions/notes)  Intervention: Identify and Manage Fall Risk  Recent Flowsheet Documentation  Taken 02/18/2024 1905 by Mare ORN, RN  Safety Promotion/Fall Prevention:   activity supervised   fall prevention program maintained   motion sensor pad activated   nonskid shoes/slippers when out of bed   safety round/check completed  Intervention: Prevent Skin Injury  Recent Flowsheet Documentation  Taken 02/19/2024 0000 by Mare ORN, RN  Body Position: supine, head elevated  Taken 02/18/2024 2200 by M'Kayla W, RN  Body Position: side lying, right  Taken 02/18/2024 1905 by Mare ORN, RN  Body Position: supine, head elevated  Skin Protection:   adhesive use limited   tubing/devices free from skin contact   transparent dressing maintained  Intervention: Prevent and Manage VTE (Venous Thromboembolism) Risk  Recent Flowsheet Documentation  Taken 02/18/2024 1905 by Mare ORN, RN  VTE Prevention/Management: dorsiflexion/plantar flexion  performed  Goal: Optimal Comfort and Wellbeing  Outcome: Ongoing (see interventions/notes)  Intervention: Provide Person-Centered Care  Recent Flowsheet Documentation  Taken 02/18/2024 1905 by Mare ORN, RN  Trust Relationship/Rapport:   care explained   choices provided   questions answered   questions encouraged   thoughts/feelings acknowledged  Goal: Rounds/Family Conference  Outcome: Ongoing (see interventions/notes)     Problem: Fall Injury Risk  Goal: Absence of Fall and Fall-Related Injury  Outcome: Ongoing (see interventions/notes)  Intervention: Identify and Manage Contributors  Recent Flowsheet Documentation  Taken 02/18/2024 1905 by Mare ORN, RN  Self-Care Promotion:   independence encouraged   BADL personal objects within reach   BADL personal routines maintained  Medication Review/Management: medications reviewed  Intervention: Promote Injury-Free Environment  Recent Flowsheet Documentation  Taken 02/18/2024 1905 by Mare ORN, RN  Safety Promotion/Fall Prevention:   activity supervised   fall prevention program maintained   motion sensor pad activated   nonskid shoes/slippers when out of bed   safety round/check completed     Problem: Gas Exchange Impaired  Goal: Optimal Gas Exchange  Outcome: Ongoing (see interventions/notes)  Intervention: Optimize Oxygenation and Ventilation  Recent Flowsheet Documentation  Taken 02/18/2024 1905 by Mare ORN, RN  Head of Bed (HOB) Positioning: HOB at 30-45 degrees     Problem: Skin Injury Risk Increased  Goal: Skin Health and Integrity  Outcome: Ongoing (see interventions/notes)  Intervention: Optimize Skin Protection  Recent Flowsheet Documentation  Taken 02/18/2024 2200 by M'Kayla W, RN  Activity Management: ambulated in hall  Taken 02/18/2024 1905 by M'Kayla W, RN  Pressure Reduction Techniques:   Mobility is maximized  Patient turned q 2 hours   Frequent weight shifting encouraged  Pressure Reduction Devices: Repositioning wedges/pillows utilized  Skin Protection:    adhesive use limited   tubing/devices free from skin contact   transparent dressing maintained  Activity Management: ROM, active encouraged  Head of Bed (HOB) Positioning: HOB at 30-45 degrees

## 2024-02-19 NOTE — Care Management Notes (Addendum)
 [facility]  Care Management Note    Patient Name: Misty Johnson  Date of Birth: April 19, 1951  Sex: female  Date/Time of Admission: 02/14/2024  6:19 PM  Room/Bed: 322/A  Payor: HU[employer]NA MEDICARE / Plan: HUMANA MEDICARE / Product Type: MEDICARE [facility] /    LOS: 0 days   Primary Care Providers:  Andra Pastor, MD, MD (General)    Admitting Diagnosis:  Acute exacerbation of chronic obstructive pulmonary disease (COPD) (CMS HCC) [J44.1]    Assessment:       Discharge Plan:  Home with Home Health (code 6)  [facility] ACCEPTED [facility] ORDER FOR NURSING/PT/OT/AIDE VIA CAREPORT.     CM NOTIFIED THE DAUGHTER THAT [facility] WILL BE ABLE TO PROVIDE AN AIDE AT D/C HOME. CM PROVIDED THE DAUGHTER WITH THE NAME OF THE [facility] SO She CAN ASK IF THEY PROVIDE ANY SERVICES FOR PATIENT WITH MEDICARE BUT NOT MEDICAID.    CM SPOKE WITH ALICIA AT [facility], SHE STATES THAT THEY DON'T PROVIDE SERVICES IN [place] UNLESS THE PATIENT PRIVATE PAYS AND THEY HAVE A PROVIDER IN THE AREA.     The patient will continue to be evaluated for developing discharge needs.     Case Manager: Orie Louder, SOCIAL WORKER  Phone: [phone]

## 2024-02-19 NOTE — Discharge Summary (Signed)
 [facility]     DISCHARGE SUMMARY      PATIENT NAME:  Misty Johnson   MRN:  [id number]  DOB:  1950-12-15    INPATIENT ADMISSION DATE: 02/14/2024   DATE OF DISCHARGE:  02/19/24     ATTENDING PHYSICIAN: Misty JONETTA Borrow, PA-C     PRIMARY CARE PHYSICIAN: Misty Endo, MD     HOSPITAL PRESENTATION:    Please see full admission H&P for details.      As per HPI:   Misty Johnson is a 73 y.o., White female who presents to [facility] from [facility] with complaints of increased shortness of breath.  Patient was seen and examined at bedside.  Patient was started on p.o. antibiotics as well as p.o. steroids earlier this week.  Patient states that the antibiotics started given her diarrhea so she decided to quit taking him.  Patient states she was not getting better so she came to the ED to be evaluated.  Patient was started on IV antibiotics in the ED. patient states that her breathing is doing better at this time.  Patient states that she was wheezing and very short of breath prior to arrival to the ED. patient states she normally wears O2 at 3 L via NC at home.  Patient is still currently at that at this time.  Patient denies any other complaints at this time.  Patient will be admitted to hospitalist services for further evaluation.         FURTHER HOSPITAL COURSE WITH DISCHARGE DIAGNOSES:  Patient admitted and started on IV solumedrol, duonebs and continued on supplemental O2. Palliative care spoke with patient and she would like a referral on d/c. Nursing reported hallucinations. Daughter called and stated that this happens when she is in the hospital . Psych consulted and added Zyprexa  as needed.  Patient's symptoms have improved. She is no longer wheezing She has walked around the floor with nursing. She can be discharged home today to f/u with her PCP. The Hospitalist personally evaluated and examined the patient in conjunction with the MLP and agree with the assessments, treatment plan and  disposition of the patient as recorded by the [facility].            Problem List:  Active Hospital Problems   (*Primary Problem)    Diagnosis    *Acute exacerbation of chronic obstructive pulmonary disease (COPD) (CMS HCC)         Major depression     Nutrition:    DIET CARDIAC (2G NA, LOWFAT, LOW CHOL) Do you want to initiate MNT Protocol? Yes    Additional clinical characteristics related to nutrition:    - monitor for weight changes   - monitor intake and output    - monitor bowel functions                    PHYSICAL EXAM   DAY OF DISCHARGE:    BP (!) 131/54   Pulse 63   Temp 36.7 C (98 F)   Resp 18   Ht 1.626 m (5' 4)   Wt 78.5 kg (173 lb 1.6 oz)   SpO2 99%   BMI 29.71 kg/m          General:  Patient is resting in bed, no acute distress, alert and oriented x3   Eyes:  PERRL, no scleral icterus   HENT:  Normocephalic, atraumatic, oral mucosa is moist and pink, no nasal discharge   Heart:  RRR,  S1 and S2 auscultated, no murmurs appreciated   Lungs:  Unlabored respirations.  Lungs are clear to auscultation bilaterally, no wheezes, no rales  Abdomen:  Soft, active bowel sounds, non-tender to palpation, non-distended  Extremities:  No edema in lower extremities bilaterally   Skin:  Warm and dry.  Not diaphoretic  Neuro:  A&O x 3.  No focal deficits.  Speech intact.  Not tremulous  Psych:  Cooperative, not agitated    LABS:  CBC with Diff (Last 48 Hours):    Recent Results in last 48 hours     02/18/24  0835   WBC 8.5   HGB 10.4*   HCT 32.4   MCV 80.1   PLTCNT 148   PMNS 93*   LYMPHOCYTES 4*   MONOCYTES 3*   EOSINOPHIL 0*   BASOPHILS 0  0.00          Last BMP  (Last result in the past 48 hours)        Na   K   Cl   CO2   BUN   Cr   Calcium    Glucose   Glucose-Fasting        02/18/24 0835 140   4.9   102   34   26   0.65   9.1   183               Last Hepatic Panel  (Last result in the past 48 hours)        Albumin   Total PTN   Total Bili   Direct Bili   Ast/SGOT   Alt/SGPT   Alk Phos        02/18/24 0835 3.7    5.9   0.6     13   7    41                BMP (Last 48 Hours):    Recent Results in last 48 hours     02/18/24  0835   SODIUM 140   POTASSIUM 4.9   CHLORIDE 102   CO2 34*   BUN 26*   CREATININE 0.65   CALCIUM  9.1   GLUCOSENF 183*          DISCHARGE MEDICATIONS:     Current Discharge Medication List        CONTINUE these medications - NO CHANGES were made during your visit.        Details   albuterol  sulfate 2.5 mg /3 mL (0.083 %) nebulizer solution  Commonly known as: PROVENTIL    2.5 mg, DAILY PRN  Refills: 0     atorvastatin  40 mg Tablet  Commonly known as: LIPITOR   40 mg, EVERY EVENING  Refills: 0     ergocalciferol (vitamin D2) 1,250 mcg (50,000 unit) Capsule  Commonly known as: DRISDOL   50,000 Units, EVERY 7 DAYS  Refills: 0     escitalopram  oxalate 20 mg Tablet  Commonly known as: LEXAPRO    20 mg, Daily  Refills: 0     furosemide  40 mg Tablet  Commonly known as: LASIX    40 mg, Daily  Refills: 0     ipratropium-albuteroL  0.5 mg-3 mg(2.5 mg base)/3 mL nebulizer solution  Commonly known as: DUONEB   3 mL, Nebulization, 4 TIMES DAILY  Refills: 0     Levocetirizine 5 mg Tablet  Commonly known as: XYZAL   5 mg, EVERY EVENING  Refills: 0     metoprolol  succinate 25 mg Tablet  Sustained Release 24 hr  Commonly known as: TOPROL -XL   25 mg, Daily  Refills: 0     omeprazole 20 mg Capsule, Delayed Release(E.C.)  Commonly known as: PRILOSEC   20 mg, Daily  Refills: 0     ondansetron  4 mg Tablet, Rapid Dissolve  Commonly known as: ZOFRAN  ODT   4 mg, Oral, EVERY 8 HOURS PRN  Qty: 12 Tablet  Refills: 0     OXYGEN-AIR DELIVERY SYSTEMS DEACT   3L NC at home  Refills: 0     polyethylene glycol 17 gram Powder in Packet  Commonly known as: MIRALAX    34 g, Oral, 2 TIMES DAILY  Refills: 0     potassium chloride  20 mEq Tab Sust.Rel. Particle/Crystal  Commonly known as: K-DUR   20 mEq, Daily  Refills: 0     predniSONE  20 mg Tablet  Commonly known as: DELTASONE    20 mg, Oral, Daily  Qty: 4 Tablet  Refills: 0     Trelegy Ellipta  100-62.5-25 mcg Disk with Device  Generic drug: fluticasone -umeclidin-vilanter   1 Inhalation, Daily  Refills: 0            STOP taking these medications.      amoxicillin -pot clavulanate 875-125 mg Tablet  Commonly known as: AUGMENTIN      aspirin  81 mg Tablet, Chewable     sennosides-docusate sodium  8.6-50 mg Tablet  Commonly known as: SENOKOT-S                 DISCHARGE DISPOSITION:  home with home health     DISCHARGE INSTRUCTIONS:        Refer to Home Health - EXTERNAL                Copies sent to Care Team         Relationship Specialty Notifications Start End    Andra Pastor, MD PCP - General FAMILY MEDICINE  04/13/22     Phone: [phone] Fax: [phone]         [address] [place] [address]            >30 minutes total were spent coordinating discharge day today      Lydiah Pong D Maleigha Colvard, PA-C  [place] [facility] HOSPITALIST

## 2024-02-21 NOTE — H&P (Deleted)
 PULMONARY, [facility]  [address]  [place] [address]  Operated by [facility] [place]     New Patient    Patient Name: Misty Johnson  Date: 02/22/2024  Department:  PULMONARY, [facility]  MRN: [id number]  DOB: 1951-04-29  Primary Care Provider:  Manus Endo, MD  Referring Provider:  No ref. provider found      Chief Complaint: No chief complaint on file.        History of Present Illness     Referred from ER physician for recurrent infections, COPD, and CHF.    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     Enlarged heart     LVH (left ventricular hypertrophy)     Major depression      Past Surgical History  Past Surgical History:   Procedure Laterality Date    NO PAST SURGERIES           Medication List  Current Outpatient Medications   Medication Sig    albuterol  sulfate (PROVENTIL ) 2.5 mg /3 mL (0.083 %) Inhalation nebulizer solution Take 3 mL (2.5 mg total) by nebulization Once per day as needed for Wheezing    atorvastatin  (LIPITOR) 40 mg Oral Tablet Take 1 Tablet (40 mg total) by mouth Every evening    ergocalciferol, vitamin D2, (DRISDOL) 1,250 mcg (50,000 unit) Oral Capsule Take 1 Capsule (50,000 Units total) by mouth Every 7 days    escitalopram  oxalate (LEXAPRO ) 20 mg Oral Tablet Take 1 Tablet (20 mg total) by mouth Daily    fluticasone -umeclidin-vilanter (TRELEGY ELLIPTA) 100-62.5-25 mcg Inhalation Disk with Device Take 1 Inhalation by inhalation Daily    furosemide  (LASIX ) 40 mg Oral Tablet Take 1 Tablet (40 mg total) by mouth Daily    ipratropium-albuterol  0.5 mg-3 mg(2.5 mg base)/3 mL Solution  for Nebulization Take 3 mL by nebulization Four times a day    Levocetirizine (XYZAL) 5 mg Oral Tablet Take 1 Tablet (5 mg total) by mouth Every evening    metoprolol  succinate (TOPROL -XL) 25 mg Oral Tablet Sustained Release 24 hr Take 1 Tablet (25 mg total) by mouth Daily    omeprazole (PRILOSEC) 20 mg Oral Capsule, Delayed Release(E.C.) Take 1 Capsule (20 mg total) by mouth Daily    ondansetron  (ZOFRAN  ODT) 4 mg Oral Tablet, Rapid Dissolve Take 1 Tablet (4 mg total) by mouth Every 8 hours as needed for Nausea/Vomiting    OXYGEN-AIR DELIVERY SYSTEMS DEACT 3L NC at home    polyethylene glycol (MIRALAX ) 17 gram Oral Powder in Packet Take 2 Packets (34 g total) by mouth Twice daily    potassium chloride  (K-DUR) 20 mEq Oral Tab Sust.Rel. Particle/Crystal Take 1 Tablet (20 mEq total) by mouth Daily    predniSONE  (DELTASONE ) 20 mg Oral Tablet Take 1 Tablet (20 mg total) by mouth Daily for 4 days     Allergy List  Allergy History as of 02/21/24       HYDROCODONE          Noted Status  Severity Type Reaction    12/17/21 1234 Milta Service, RN 12/17/21 Active Low  Nausea/ Vomiting                  Family History   Family Medical History:       Problem Relation (Age of Onset)    No Known Problems Mother, Father            Social History  Social History     Socioeconomic History    Marital status: Widowed   Tobacco Use    Smoking status: Every Day     Current packs/day: 1.00     Types: Cigarettes    Smokeless tobacco: Never   Vaping Use    Vaping status: Never Used   Substance and Sexual Activity    Alcohol  use: Not Currently    Drug use: Never    Sexual activity: Not Currently     Social Determinants of Health     Social Connections: Low Risk  (02/15/2024)    Social Connections     SDOH Social Isolation: 5 or more times a week        Review of system  General:  Denies fever, chills, night sweats, loss of appetite.  Neurological:  Denies dizziness or seizures.  Ears, Nose, Throat: Denies ear pain, nasal/sinus congestion or pain,  or sore throat.   Gastrointestinal:  Denies uncontrolled reflux, heartburn, nausea, or diarrhea.  Cardiovascular:  Denies chest pain or irregular heartbeats.  Respiratory: see HPI  Musculoskeletal:  Denies uncontrolled joint pain or restless legs.  Endocrine/Metabolic:  Denies weight gain or weight loss.  Mental Status/Psychiatric:  Denies uncontrolled anxiety or depression.  Integumentary:  Denies rash or new abnormal skin lesions.    Vital Signs  There were no vitals filed for this visit.           Physical Exam      No diagnosis found.   Assessment & Plan      No orders of the defined types were placed in this encounter.      Continue medications as prescribed/directed unless changed by provider.  Plan of care discussed with patient.    No follow-ups on file.. Patient was advised to come back earlier if any symptoms get worse.  Also advised to come back for results of any test done.  If unable to keep regular appointment, patient was advised to schedule another appointment as soon as possible.     The patient was given the opportunity to ask questions and those questions were answered to the patient's satisfaction. The patient was encouraged to call with any additional questions or concerns. Discussed with the patient effects and side effects of medications. Medication safety was discussed.  The patient was informed to contact the office within 7 business days if a message/lab results/referral/imaging results have not been conveyed to the patient.    Electronically signed by Ronal Kerns, FNP-BC   Pulmonary and Critical care    This note was created with assistance from [employer] via capture of conversational audio. Consent was obtained from the patient and all parties present prior to recording.      This note may have been partially generated using MModal Fluency Direct system, and there may be some incorrect words, spellings, and punctuation that were not noted in checking the note before saving.

## 2024-02-21 NOTE — Care Management Notes (Signed)
 Referral Information  ++++++ Placed Provider #1 ++++++  Case Manager: Luiz Ochoa  Provider Type: Home Health - Return  Provider Name: [facility]  Address:  [address]  [place], [place] [address]  Contact:  Admissions Administrator  Phone: [phone] x  Fax:   Fax: [phone]

## 2024-02-22 ENCOUNTER — Ambulatory Visit (HOSPITAL_COMMUNITY): Payer: Self-pay | Admitting: SLEEP MEDICINE

## 2024-06-21 ENCOUNTER — Inpatient Hospital Stay (HOSPITAL_COMMUNITY): Payer: Medicare (Managed Care) | Admitting: Internal Medicine

## 2024-06-21 ENCOUNTER — Encounter (HOSPITAL_COMMUNITY): Payer: Self-pay | Admitting: Internal Medicine

## 2024-06-21 ENCOUNTER — Emergency Department (HOSPITAL_COMMUNITY): Payer: Medicare (Managed Care)

## 2024-06-21 ENCOUNTER — Inpatient Hospital Stay (HOSPITAL_COMMUNITY): Payer: Medicare (Managed Care)

## 2024-06-21 ENCOUNTER — Inpatient Hospital Stay
Admission: EM | Admit: 2024-06-21 | Discharge: 2024-06-29 | DRG: 193 | Disposition: A | Discharge status: Home / Self Care | Payer: Medicare (Managed Care)

## 2024-06-21 ENCOUNTER — Other Ambulatory Visit: Payer: Self-pay

## 2024-06-21 DIAGNOSIS — J159 Unspecified bacterial pneumonia: Principal | ICD-10-CM | POA: Diagnosis present

## 2024-06-21 DIAGNOSIS — Z79899 Other long term (current) drug therapy: Secondary | ICD-10-CM

## 2024-06-21 DIAGNOSIS — F329 Major depressive disorder, single episode, unspecified: Secondary | ICD-10-CM | POA: Diagnosis present

## 2024-06-21 DIAGNOSIS — D649 Anemia, unspecified: Secondary | ICD-10-CM | POA: Insufficient documentation

## 2024-06-21 DIAGNOSIS — J9811 Atelectasis: Secondary | ICD-10-CM | POA: Diagnosis present

## 2024-06-21 DIAGNOSIS — Z8709 Personal history of other diseases of the respiratory system: Principal | ICD-10-CM

## 2024-06-21 DIAGNOSIS — K921 Melena: Secondary | ICD-10-CM | POA: Diagnosis present

## 2024-06-21 DIAGNOSIS — F32A Depression, unspecified: Secondary | ICD-10-CM

## 2024-06-21 DIAGNOSIS — I509 Heart failure, unspecified: Secondary | ICD-10-CM | POA: Diagnosis present

## 2024-06-21 DIAGNOSIS — D509 Iron deficiency anemia, unspecified: Secondary | ICD-10-CM | POA: Diagnosis present

## 2024-06-21 DIAGNOSIS — K2091 Esophagitis, unspecified with bleeding: Secondary | ICD-10-CM | POA: Diagnosis present

## 2024-06-21 DIAGNOSIS — Z860100 Personal history of colon polyps, unspecified: Secondary | ICD-10-CM

## 2024-06-21 DIAGNOSIS — J449 Chronic obstructive pulmonary disease, unspecified: Secondary | ICD-10-CM | POA: Diagnosis present

## 2024-06-21 DIAGNOSIS — J9621 Acute and chronic respiratory failure with hypoxia: Secondary | ICD-10-CM | POA: Diagnosis present

## 2024-06-21 DIAGNOSIS — M4854XD Collapsed vertebra, not elsewhere classified, thoracic region, subsequent encounter for fracture with routine healing: Secondary | ICD-10-CM | POA: Diagnosis present

## 2024-06-21 DIAGNOSIS — Z7951 Long term (current) use of inhaled steroids: Secondary | ICD-10-CM

## 2024-06-21 DIAGNOSIS — Z87891 Personal history of nicotine dependence: Secondary | ICD-10-CM

## 2024-06-21 DIAGNOSIS — F17210 Nicotine dependence, cigarettes, uncomplicated: Secondary | ICD-10-CM | POA: Diagnosis present

## 2024-06-21 DIAGNOSIS — K449 Diaphragmatic hernia without obstruction or gangrene: Secondary | ICD-10-CM | POA: Diagnosis present

## 2024-06-21 DIAGNOSIS — K2971 Gastritis, unspecified, with bleeding: Secondary | ICD-10-CM | POA: Diagnosis present

## 2024-06-21 DIAGNOSIS — I2510 Atherosclerotic heart disease of native coronary artery without angina pectoris: Secondary | ICD-10-CM | POA: Diagnosis present

## 2024-06-21 DIAGNOSIS — J189 Pneumonia, unspecified organism: Secondary | ICD-10-CM | POA: Diagnosis present

## 2024-06-21 DIAGNOSIS — K5900 Constipation, unspecified: Secondary | ICD-10-CM | POA: Diagnosis not present

## 2024-06-21 DIAGNOSIS — Z8719 Personal history of other diseases of the digestive system: Secondary | ICD-10-CM

## 2024-06-21 DIAGNOSIS — Z5329 Procedure and treatment not carried out because of patient's decision for other reasons: Secondary | ICD-10-CM | POA: Diagnosis not present

## 2024-06-21 DIAGNOSIS — Z1152 Encounter for screening for COVID-19: Secondary | ICD-10-CM

## 2024-06-21 DIAGNOSIS — R0602 Shortness of breath: Secondary | ICD-10-CM

## 2024-06-21 DIAGNOSIS — J440 Chronic obstructive pulmonary disease with acute lower respiratory infection: Secondary | ICD-10-CM | POA: Diagnosis present

## 2024-06-21 DIAGNOSIS — Z9981 Dependence on supplemental oxygen: Secondary | ICD-10-CM

## 2024-06-21 LAB — BLOOD GAS W/ CO-OX, LYTES, LACTATE REFLEX
%FIO2 (VENOUS): 32 %
%FIO2 (VENOUS): 32 %
BASE EXCESS: 8.2 mmol/L — ABNORMAL HIGH (ref 0.0–3.0)
BASE EXCESS: 8.1 mmol/L — ABNORMAL HIGH (ref 0.0–3.0)
BICARBONATE (VENOUS): 31.2 mmol/L — ABNORMAL HIGH (ref 22.0–29.0)
BICARBONATE (VENOUS): 31.2 mmol/L — ABNORMAL HIGH (ref 22.0–29.0)
CARBOXYHEMOGLOBIN: 4.4 % — ABNORMAL HIGH (ref <=3.0)
CARBOXYHEMOGLOBIN: 3.8 % — ABNORMAL HIGH (ref <=3.0)
CHLORIDE: 101 mmol/L (ref 98–107)
CHLORIDE: 101 mmol/L (ref 98–107)
GLUCOSE: 89 mg/dL (ref 65–125)
GLUCOSE: 168 mg/dL — ABNORMAL HIGH (ref 65–125)
HEMATOCRITRT: 28 % — ABNORMAL LOW (ref 37–50)
HEMATOCRITRT: 29 % — ABNORMAL LOW (ref 37–50)
HEMOGLOBIN: 9.2 g/dL — ABNORMAL LOW (ref 12.0–18.0)
HEMOGLOBIN: 9.5 g/dL — ABNORMAL LOW (ref 12.0–18.0)
IONIZED CALCIUM: 1.05 mmol/L — ABNORMAL LOW (ref 1.15–1.33)
IONIZED CALCIUM: 1.05 mmol/L — ABNORMAL LOW (ref 1.15–1.33)
LACTATE: 0.9 mmol/L (ref <=1.9)
LACTATE: 1.2 mmol/L (ref <=1.9)
MET-HEMOGLOBIN: <0.7 % (ref <=1.5)
MET-HEMOGLOBIN: 0.9 % (ref <=1.5)
O2 SATURATION (VENOUS): 91.1 % (ref 40.0–85.0)
O2 SATURATION (VENOUS): 96.8 % (ref 40.0–85.0)
O2CT: 11.3 %
O2CT: 12.5 %
OXYHEMOGLOBIN: 87.2 % (ref 40.0–80.0)
OXYHEMOGLOBIN: 92.4 % (ref 40.0–80.0)
PCO2 (VENOUS): 45 mmHg (ref 41–51)
PCO2 (VENOUS): 55 mmHg — ABNORMAL HIGH (ref 41–51)
PH (VENOUS): 7.47 — ABNORMAL HIGH (ref 7.32–7.43)
PH (VENOUS): 7.4 (ref 7.32–7.43)
PO2 (VENOUS): 57 mmHg (ref 35–50)
PO2 (VENOUS): 89 mmHg (ref 35–50)
SODIUM: 138 mmol/L (ref 136–145)
SODIUM: 137 mmol/L (ref 136–145)
WHOLE BLOOD POTASSIUM: 4.3 mmol/L (ref 3.5–5.1)
WHOLE BLOOD POTASSIUM: 4.3 mmol/L (ref 3.5–5.1)

## 2024-06-21 LAB — TYPE AND SCREEN
ABO/RH(D): B NEG
ANTIBODY SCREEN: NEGATIVE

## 2024-06-21 LAB — COMPREHENSIVE METABOLIC PANEL, NON-FASTING
ALBUMIN/GLOBULIN RATIO: 1.7 — ABNORMAL HIGH (ref 0.8–1.4)
ALBUMIN: 4 g/dL (ref 3.5–5.7)
ALKALINE PHOSPHATASE: 56 U/L (ref 34–104)
ALT (SGPT): 7 U/L (ref 7–52)
ANION GAP: 7 mmol/L (ref 4–13)
AST (SGOT): 17 U/L (ref 13–39)
BILIRUBIN TOTAL: 0.7 mg/dL (ref 0.3–1.0)
BUN/CREA RATIO: 20 (ref 6–22)
BUN: 17 mg/dL (ref 7–25)
CALCIUM, CORRECTED: 8.8 mg/dL — ABNORMAL LOW (ref 8.9–10.8)
CALCIUM: 8.8 mg/dL (ref 8.6–10.3)
CHLORIDE: 101 mmol/L (ref 98–107)
CO2 TOTAL: 33 mmol/L — ABNORMAL HIGH (ref 21–31)
CREATININE: 0.83 mg/dL (ref 0.60–1.30)
ESTIMATED GFR: 73 mL/min/1.73mˆ2 (ref >59)
GLOBULIN: 2.4 (ref 2.0–3.5)
GLUCOSE: 94 mg/dL (ref 74–109)
OSMOLALITY, CALCULATED: 283 mosm/kg (ref 270–290)
POTASSIUM: 4.4 mmol/L (ref 3.5–5.1)
PROTEIN TOTAL: 6.4 g/dL (ref 6.4–8.9)
SODIUM: 141 mmol/L (ref 136–145)

## 2024-06-21 LAB — CBC WITH DIFF
BASOPHIL #: 0.1 x10ˆ3/uL (ref 0.00–0.10)
BASOPHIL %: 1 % (ref 0–1)
EOSINOPHIL #: 0 x10ˆ3/uL (ref 0.00–0.50)
EOSINOPHIL %: 1 % (ref 1–7)
HCT: 28.7 % — ABNORMAL LOW (ref 31.2–41.9)
HGB: 9.1 g/dL — ABNORMAL LOW (ref 10.9–14.3)
LYMPHOCYTE #: 1 x10ˆ3/uL — ABNORMAL LOW (ref 1.10–3.10)
LYMPHOCYTE %: 18 % (ref 16–46)
MCH: 25 pg (ref 24.7–32.8)
MCHC: 31.8 g/dL — ABNORMAL LOW (ref 32.3–35.6)
MCV: 78.7 fL (ref 75.5–95.3)
MONOCYTE #: 0.6 x10ˆ3/uL (ref 0.20–0.90)
MONOCYTE %: 10 % (ref 4–11)
MPV: 10.1 fL (ref 7.9–10.8)
NEUTROPHIL #: 3.9 x10ˆ3/uL (ref 1.90–8.20)
NEUTROPHIL %: 70 % (ref 43–77)
PLATELETS: 145 x10ˆ3/uL (ref 140–440)
RBC: 3.65 x10ˆ6/uL (ref 3.63–4.92)
RDW: 18.6 % — ABNORMAL HIGH (ref 12.3–17.7)
WBC: 5.5 x10ˆ3/uL (ref 3.8–11.8)

## 2024-06-21 LAB — RETICULOCYTE COUNT
IMMATURE RETIC FRACTION: 0.52 (ref 0.26–0.52)
RETICULOCYTE % AUTOMATED: 2.02 % (ref 0.51–2.17)

## 2024-06-21 LAB — FERRITIN: FERRITIN: 7 ng/mL — ABNORMAL LOW (ref 11–307)

## 2024-06-21 LAB — LACTIC ACID LEVEL W/ REFLEX FOR LEVEL >2.0: LACTIC ACID: 0.9 mmol/L (ref 0.5–2.2)

## 2024-06-21 LAB — IRON TRANSFERRIN AND TIBC
IRON (TRANSFERRIN) SATURATION: 5 % — ABNORMAL LOW (ref 15–50)
IRON: 23 ug/dL — ABNORMAL LOW (ref 50–212)
TOTAL IRON BINDING CAPACITY: 456 ug/dL — ABNORMAL HIGH (ref 250–450)
TRANSFERRIN: 326 mg/dL (ref 203–362)
UIBC: 433 ug/dL — ABNORMAL HIGH (ref 130–375)

## 2024-06-21 LAB — PTT (PARTIAL THROMBOPLASTIN TIME): APTT: 30.7 s (ref 25.0–38.0)

## 2024-06-21 LAB — TROPONIN-I
TROPONIN I: 12 ng/L (ref <15)
TROPONIN I: 13 ng/L (ref <15)
TROPONIN I: 12 ng/L (ref <15)
TROPONIN I: 11 ng/L (ref <15)

## 2024-06-21 LAB — PT/INR
INR: 1.09 (ref 0.84–1.10)
PROTHROMBIN TIME: 12.3 s (ref 9.8–12.7)

## 2024-06-21 LAB — LIPASE: LIPASE: 65 U/L (ref 11–82)

## 2024-06-21 LAB — MAGNESIUM: MAGNESIUM: 2 mg/dL (ref 1.9–2.7)

## 2024-06-21 LAB — B-TYPE NATRIURETIC PEPTIDE (BNP),PLASMA: BNP: 700 pg/mL — ABNORMAL HIGH (ref 1–100)

## 2024-06-21 MED ORDER — IPRATROPIUM 0.5 MG-ALBUTEROL 3 MG (2.5 MG BASE)/3 ML NEBULIZATION SOLN
3.0000 mL | INHALATION_SOLUTION | RESPIRATORY_TRACT | Status: DC | PRN
  Administered 2024-06-23 – 2024-06-28 (×4): 3 mL via RESPIRATORY_TRACT
  Filled 2024-06-21: qty 3

## 2024-06-21 MED ORDER — IOHEXOL 350 MG IODINE/ML INTRAVENOUS SOLUTION
75.0000 mL | INTRAVENOUS | Status: AC
  Administered 2024-06-21: 75 mL via INTRAVENOUS

## 2024-06-21 MED ORDER — DOXYCYCLINE HYCLATE 100 MG TABLET
ORAL_TABLET | ORAL | Status: AC
  Filled 2024-06-21: qty 1

## 2024-06-21 MED ORDER — SODIUM CHLORIDE 0.9 % INTRAVENOUS SOLUTION
200.0000 mg | Freq: Every day | INTRAVENOUS | Status: AC
  Administered 2024-06-21: 0 mg via INTRAVENOUS
  Administered 2024-06-21 – 2024-06-22 (×2): 200 mg via INTRAVENOUS
  Administered 2024-06-22: 0 mg via INTRAVENOUS
  Administered 2024-06-23: 200 mg via INTRAVENOUS
  Administered 2024-06-23: 0 mg via INTRAVENOUS
  Filled 2024-06-21 (×4): qty 10

## 2024-06-21 MED ORDER — DEXTROSE 5% IN WATER (D5W) FLUSH BAG - 250 ML: INTRAVENOUS | Status: DC | PRN

## 2024-06-21 MED ORDER — DOXYCYCLINE HYCLATE 100 MG TABLET
100.0000 mg | ORAL_TABLET | Freq: Two times a day (BID) | ORAL | Status: DC
  Administered 2024-06-21 – 2024-06-24 (×6): 100 mg via ORAL
  Administered 2024-06-24: 0 mg via ORAL
  Administered 2024-06-25 – 2024-06-29 (×9): 100 mg via ORAL
  Filled 2024-06-21 (×15): qty 1

## 2024-06-21 MED ORDER — GUAIFENESIN ER 600 MG TABLET, EXTENDED RELEASE 12 HR
600.0000 mg | EXTENDED_RELEASE_TABLET | Freq: Two times a day (BID) | ORAL | Status: DC
  Administered 2024-06-21 – 2024-06-24 (×6): 600 mg via ORAL
  Administered 2024-06-24: 0 mg via ORAL
  Administered 2024-06-25 – 2024-06-29 (×9): 600 mg via ORAL
  Filled 2024-06-21 (×15): qty 1

## 2024-06-21 MED ORDER — METHYLPREDNISOLONE SOD SUCC 125 MG SOLUTION FOR INJECTION WRAPPER
125.0000 mg | INTRAVENOUS | Status: AC
  Administered 2024-06-21: 125 mg via INTRAVENOUS

## 2024-06-21 MED ORDER — SODIUM CHLORIDE 0.9 % INTRAVENOUS SOLUTION
INTRAVENOUS | Status: AC
  Filled 2024-06-21: qty 100

## 2024-06-21 MED ORDER — SODIUM CHLORIDE 0.9 % (FLUSH) INJECTION SYRINGE
3.0000 mL | INJECTION | Freq: Three times a day (TID) | INTRAMUSCULAR | Status: DC
  Administered 2024-06-21 – 2024-06-22 (×2): 0 mL

## 2024-06-21 MED ORDER — SODIUM CHLORIDE 0.9 % (FLUSH) INJECTION SYRINGE: 3.0000 mL | INJECTION | INTRAMUSCULAR | Status: DC | PRN

## 2024-06-21 MED ORDER — RAMELTEON 8 MG TABLET
ORAL_TABLET | ORAL | Status: AC
  Filled 2024-06-21: qty 1

## 2024-06-21 MED ORDER — IPRATROPIUM 0.5 MG-ALBUTEROL 3 MG (2.5 MG BASE)/3 ML NEBULIZATION SOLN
9.0000 mL | INHALATION_SOLUTION | RESPIRATORY_TRACT | Status: AC
  Administered 2024-06-21: 9 mL via RESPIRATORY_TRACT

## 2024-06-21 MED ORDER — SODIUM CHLORIDE 0.9% FLUSH BAG - 250 ML: INTRAVENOUS | Status: DC | PRN

## 2024-06-21 MED ORDER — ENOXAPARIN 40 MG/0.4 ML SUBCUTANEOUS SYRINGE
INJECTION | SUBCUTANEOUS | Status: AC
  Filled 2024-06-21: qty 0.4

## 2024-06-21 MED ORDER — SODIUM CHLORIDE 0.9 % INTRAVENOUS SOLUTION
3.0000 g | INTRAVENOUS | Status: DC
  Administered 2024-06-21: 0 g via INTRAVENOUS
  Administered 2024-06-21: 3 g via INTRAVENOUS

## 2024-06-21 MED ORDER — ENOXAPARIN 40 MG/0.4 ML SUBCUTANEOUS SYRINGE
40.0000 mg | INJECTION | SUBCUTANEOUS | Status: DC
  Administered 2024-06-21 – 2024-06-23 (×3): 40 mg via SUBCUTANEOUS
  Administered 2024-06-24: 0 mg via SUBCUTANEOUS
  Administered 2024-06-25 – 2024-06-28 (×4): 40 mg via SUBCUTANEOUS
  Filled 2024-06-21 (×7): qty 0.4

## 2024-06-21 MED ORDER — GUAIFENESIN ER 600 MG TABLET, EXTENDED RELEASE 12 HR
EXTENDED_RELEASE_TABLET | ORAL | Status: AC
  Filled 2024-06-21: qty 1

## 2024-06-21 MED ORDER — METHYLPREDNISOLONE SOD SUCC 125 MG SOLUTION FOR INJECTION WRAPPER
INTRAVENOUS | Status: AC
  Filled 2024-06-21: qty 2

## 2024-06-21 MED ORDER — AMPICILLIN-SULBACTAM 3 GRAM SOLUTION FOR INJECTION
INTRAMUSCULAR | Status: AC
  Filled 2024-06-21: qty 8

## 2024-06-21 MED ORDER — SODIUM CHLORIDE 0.9 % INTRAVENOUS SOLUTION
INTRAVENOUS | Status: DC
  Administered 2024-06-22: 0 mL via INTRAVENOUS

## 2024-06-21 MED ORDER — RAMELTEON 8 MG TABLET
8.0000 mg | ORAL_TABLET | Freq: Every evening | ORAL | Status: DC
  Administered 2024-06-21 – 2024-06-28 (×8): 8 mg via ORAL
  Filled 2024-06-21 (×7): qty 1

## 2024-06-21 MED ORDER — SENNOSIDES 8.6 MG-DOCUSATE SODIUM 50 MG TABLET
1.0000 | ORAL_TABLET | Freq: Two times a day (BID) | ORAL | Status: DC | PRN
  Administered 2024-06-29: 1 via ORAL
  Filled 2024-06-21: qty 1

## 2024-06-21 MED ORDER — SODIUM CHLORIDE 0.9 % (FLUSH) INJECTION SYRINGE
3.0000 mL | INJECTION | Freq: Three times a day (TID) | INTRAMUSCULAR | Status: DC
  Administered 2024-06-21: 0 mL
  Administered 2024-06-21 – 2024-06-22 (×2): 3 mL
  Administered 2024-06-22 (×2): 0 mL
  Administered 2024-06-23 (×2): 3 mL
  Administered 2024-06-23: 0 mL
  Administered 2024-06-24 – 2024-06-25 (×4): 3 mL
  Administered 2024-06-25: 0 mL
  Administered 2024-06-25: 3 mL
  Administered 2024-06-26: 0 mL
  Administered 2024-06-26 – 2024-06-29 (×9): 3 mL

## 2024-06-21 MED ORDER — DOXYCYCLINE HYCLATE 100 MG TABLET
100.0000 mg | ORAL_TABLET | ORAL | Status: AC
  Administered 2024-06-21: 100 mg via ORAL

## 2024-06-21 MED ORDER — SODIUM CHLORIDE 0.9 % INTRAVENOUS SOLUTION
3.0000 g | Freq: Four times a day (QID) | INTRAVENOUS | Status: DC
  Administered 2024-06-22: 3 g via INTRAVENOUS
  Administered 2024-06-22 (×2): 0 g via INTRAVENOUS
  Administered 2024-06-22: 3 g via INTRAVENOUS
  Administered 2024-06-22 (×2): 0 g via INTRAVENOUS
  Administered 2024-06-22 (×2): 3 g via INTRAVENOUS
  Administered 2024-06-23: 0 g via INTRAVENOUS
  Administered 2024-06-23: 3 g via INTRAVENOUS
  Administered 2024-06-23 (×2): 0 g via INTRAVENOUS
  Administered 2024-06-23 (×3): 3 g via INTRAVENOUS
  Administered 2024-06-23 – 2024-06-24 (×2): 0 g via INTRAVENOUS
  Administered 2024-06-24 (×2): 3 g via INTRAVENOUS
  Administered 2024-06-24 (×4): 0 g via INTRAVENOUS
  Administered 2024-06-24: 3 g via INTRAVENOUS
  Administered 2024-06-24: 0 g via INTRAVENOUS
  Administered 2024-06-24: 3 g via INTRAVENOUS
  Administered 2024-06-25: 0 g via INTRAVENOUS
  Administered 2024-06-25: 3 g via INTRAVENOUS
  Administered 2024-06-25: 0 g via INTRAVENOUS
  Administered 2024-06-25: 3 g via INTRAVENOUS
  Administered 2024-06-25: 0 g via INTRAVENOUS
  Administered 2024-06-25 (×2): 3 g via INTRAVENOUS
  Administered 2024-06-25: 0 g via INTRAVENOUS
  Administered 2024-06-26: 3 g via INTRAVENOUS
  Administered 2024-06-26 (×4): 0 g via INTRAVENOUS
  Administered 2024-06-26 – 2024-06-27 (×4): 3 g via INTRAVENOUS
  Administered 2024-06-27 (×2): 0 g via INTRAVENOUS
  Administered 2024-06-27: 3 g via INTRAVENOUS
  Administered 2024-06-27: 0 g via INTRAVENOUS
  Administered 2024-06-27: 3 g via INTRAVENOUS
  Administered 2024-06-27: 0 g via INTRAVENOUS
  Administered 2024-06-27: 3 g via INTRAVENOUS
  Administered 2024-06-28 (×2): 0 g via INTRAVENOUS
  Administered 2024-06-28 (×3): 3 g via INTRAVENOUS
  Administered 2024-06-28: 0 g via INTRAVENOUS
  Filled 2024-06-21 (×12): qty 8
  Filled 2024-06-21: qty 100
  Filled 2024-06-21 (×11): qty 8

## 2024-06-21 MED ORDER — IPRATROPIUM 0.5 MG-ALBUTEROL 3 MG (2.5 MG BASE)/3 ML NEBULIZATION SOLN
3.0000 mL | INHALATION_SOLUTION | Freq: Four times a day (QID) | RESPIRATORY_TRACT | Status: DC
  Administered 2024-06-21 – 2024-06-24 (×10): 3 mL via RESPIRATORY_TRACT

## 2024-06-21 MED ORDER — SODIUM CHLORIDE 0.9 % INTRAVENOUS SOLUTION
200.0000 mg | Freq: Every day | INTRAVENOUS | Status: DC
  Filled 2024-06-21: qty 10

## 2024-06-21 MED ORDER — SODIUM CHLORIDE 0.9 % (FLUSH) INJECTION SYRINGE
3.0000 mL | INJECTION | Freq: Three times a day (TID) | INTRAMUSCULAR | Status: DC
  Administered 2024-06-21 – 2024-06-22 (×3): 0 mL

## 2024-06-21 NOTE — ED Triage Notes (Signed)
 Called by PCP to come to ER for low iron  . Was seen yesterday for blood in stool  increase sob on o23l

## 2024-06-21 NOTE — Respiratory Therapy (Signed)
 06/21/24 1900   Timepoint   Start Time 1949   Purpose of Evaluation   Indications Prevent/Treat Atelectasis;Airway Clearance   Ordering Physician   (Dr. Toribio)   Assessment   Smoking History 2   Breath Sounds 2   Chest X-Ray 3   Surgical History 0   Level of Activity 0   Level of Conciousness 0   Respiratory Symptoms/Conditions COPD   Points Classification   Class Class 2   Summary   $Resp Eval  Respiratory to Follow   Timepoint   Stop Time 1958   Duration 9 minutes     Adding accupap

## 2024-06-21 NOTE — H&P (Signed)
 [facility]  Admission H&P    Date of Service:  06/21/2024  Misty Johnson, Misty Johnson, 73 y.o. female  Encounter Start Date:  06/21/2024  Inpatient Admission Date: 06/21/2024  Date of Birth:  Dec 17, 1950  PCP: Manus Endo, MD      Information Obtained from: patient  Chief complaint:  SOB    HPI: Misty Johnson is a 73 y.o., White female who presents with black stools and breathing difficulties.     She has low hemoglobin levels, though the exact number is unknown. There is a history of cancerous polyps from a colonoscopy approximately fifteen years ago. Recently, she has noticed blood in her stool for about a week and saw her PCP who recommended she come to the ER for an abdominal CT .       She has a history of COPD and experiences frequent wheezing. She uses breathing treatments up to four times a day. She has oxygen  at home, but there is concern that the tank may not be functioning properly. She feels unwell and is experiencing more difficulty breathing than usual.     She has a history of smoking, previously smoking a pack a day, but has reduced to half a pack a day or less. She denies any alcohol  or drug use.    CXR showing possible PNA.   Hospitalist called to admit.        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     Enlarged heart     LVH (left ventricular hypertrophy)     Major depression         Past Surgical History:   Procedure Laterality Date    NO PAST SURGERIES              Medications Prior to Admission       Prescriptions    albuterol  sulfate (PROVENTIL ) 2.5 mg /3 mL (0.083 %) Inhalation nebulizer solution    Take 3 mL (2.5 mg total) by nebulization Once per day as  needed for Wheezing    atorvastatin  (LIPITOR) 40 mg Oral Tablet    Take 1 Tablet (40 mg total) by mouth Every evening    ergocalciferol, vitamin D2, (DRISDOL) 1,250 mcg (50,000 unit) Oral Capsule    Take 1 Capsule (50,000 Units total) by mouth Every 7 days    escitalopram  oxalate (LEXAPRO ) 20 mg Oral Tablet    Take 1 Tablet (20 mg total) by mouth Daily    fluticasone -umeclidin-vilanter (TRELEGY ELLIPTA) 100-62.5-25 mcg Inhalation Disk with Device    Take 1 Inhalation by inhalation Daily    furosemide  (LASIX ) 40 mg Oral Tablet    Take 1 Tablet (40 mg total) by mouth Daily    ipratropium-albuterol  0.5 mg-3 mg(2.5 mg base)/3 mL Solution for Nebulization    Take 3 mL by nebulization Four times a day    Levocetirizine (XYZAL) 5 mg Oral Tablet    Take 1 Tablet (5 mg total) by mouth Every evening    metoprolol  succinate (TOPROL -XL) 25 mg Oral Tablet Sustained Release 24 hr    Take 1  Tablet (25 mg total) by mouth Daily    omeprazole (PRILOSEC) 20 mg Oral Capsule, Delayed Release(E.C.)    Take 1 Capsule (20 mg total) by mouth Daily    ondansetron  (ZOFRAN  ODT) 4 mg Oral Tablet, Rapid Dissolve    Take 1 Tablet (4 mg total) by mouth Every 8 hours as needed for Nausea/Vomiting    OXYGEN -AIR DELIVERY SYSTEMS DEACT    3L NC at home    polyethylene glycol (MIRALAX ) 17 gram Oral Powder in Packet    Take 2 Packets (34 g total) by mouth Twice daily    potassium chloride  (K-DUR) 20 mEq Oral Tab Sust.Rel. Particle/Crystal    Take 1 Tablet (20 mEq total) by mouth Daily          Allergies[1]  Family History:  Family Medical History:       Problem Relation (Age of Onset)    No Known Problems Mother, Father             Social History:  Social History[2]   Review of Systems:  Review of Systems   Constitutional:  Positive for malaise/fatigue. Negative for chills and fever.   HENT:  Positive for congestion and sore throat. Negative for hearing loss and nosebleeds.    Eyes:  Negative for pain, discharge and redness.   Respiratory:  Positive for  cough, sputum production and shortness of breath.    Cardiovascular:  Negative for chest pain, palpitations and leg swelling.   Gastrointestinal:  Positive for blood in stool. Negative for abdominal pain, nausea and vomiting.   Genitourinary:  Negative for dysuria and flank pain.   Musculoskeletal:  Negative for myalgias.   Skin:  Negative for itching and rash.   Neurological:  Positive for dizziness and weakness. Negative for seizures and loss of consciousness.   Psychiatric/Behavioral:  Negative for hallucinations, substance abuse and suicidal ideas.         Examination:  Temperature: 36.7 C (98.1 F) Heart Rate: 84 BP (Non-Invasive): (!) 104/32   Respiratory Rate: (!) 22 SpO2: 92 %     Physical Exam  Constitutional:       General: She is not in acute distress.     Appearance: She is ill-appearing.   HENT:      Head: Normocephalic and atraumatic.   Pulmonary:      Effort: No respiratory distress.      Breath sounds: No wheezing.   Abdominal:      General: Bowel sounds are normal.      Palpations: Abdomen is soft.      Tenderness: There is abdominal tenderness.   Musculoskeletal:      Right lower leg: No edema.      Left lower leg: No edema.   Skin:     General: Skin is warm.   Neurological:      General: No focal deficit present.      Mental Status: She is alert.   Psychiatric:         Mood and Affect: Mood normal.         Thought Content: Thought content normal.        Labs:    I have reviewed all lab results.  Lab Results Today:    Results for orders placed or performed during the hospital encounter of 06/21/24 (from the past 24 hours)   ECG 12 LEAD   Result Value Ref Range    Ventricular rate 65 BPM    QRS Duration 74 ms  QT Interval 420 ms    QTC Calculation 436 ms    Calculated R Axis 37 degrees    Calculated T Axis -81 degrees   COMPREHENSIVE METABOLIC PANEL, NON-FASTING   Result Value Ref Range    SODIUM 141 136 - 145 mmol/L    POTASSIUM 4.4 3.5 - 5.1 mmol/L    CHLORIDE 101 98 - 107 mmol/L    CO2 TOTAL  33 (H) 21 - 31 mmol/L    ANION GAP 7 4 - 13 mmol/L    BUN 17 7 - 25 mg/dL    CREATININE 9.16 9.39 - 1.30 mg/dL    BUN/CREA RATIO 20 6 - 22    ESTIMATED GFR 73 >59 mL/min/1.61m2    ALBUMIN 4.0 3.5 - 5.7 g/dL    CALCIUM  8.8 8.6 - 10.3 mg/dL    GLUCOSE 94 74 - 890 mg/dL    ALKALINE PHOSPHATASE 56 34 - 104 U/L    ALT (SGPT) 7 7 - 52 U/L    AST (SGOT) 17 13 - 39 U/L    BILIRUBIN TOTAL 0.7 0.3 - 1.0 mg/dL    PROTEIN TOTAL 6.4 6.4 - 8.9 g/dL    ALBUMIN/GLOBULIN RATIO 1.7 (H) 0.8 - 1.4    OSMOLALITY, CALCULATED 283 270 - 290 mOsm/kg    CALCIUM , CORRECTED 8.8 (L) 8.9 - 10.8 mg/dL    GLOBULIN 2.4 2.0 - 3.5   LIPASE   Result Value Ref Range    LIPASE 65 11 - 82 U/L   PT/INR   Result Value Ref Range    PROTHROMBIN TIME 12.3 9.8 - 12.7 seconds    INR 1.09 0.84 - 1.10   PTT (PARTIAL THROMBOPLASTIN TIME)   Result Value Ref Range    APTT 30.7 25.0 - 38.0 seconds   IRON  TRANSFERRIN AND TIBC   Result Value Ref Range    TOTAL IRON  BINDING CAPACITY 456 (H) 250 - 450 ug/dL    IRON  (TRANSFERRIN) SATURATION 5 (L) 15 - 50 %    IRON  23 (L) 50 - 212 ug/dL    TRANSFERRIN 673 796 - 362 mg/dL    UIBC 566 (H) 869 - 624 ug/dL   TYPE AND SCREEN   Result Value Ref Range    UNITS ORDERED NOT STATED     ABO/RH(D) B NEGATIVE     ANTIBODY SCREEN NEGATIVE     SPECIMEN EXPIRATION DATE 06/24/2024,2359    TROPONIN NOW   Result Value Ref Range    TROPONIN I 13 <15 ng/L   CBC WITH DIFF   Result Value Ref Range    WBC 5.5 3.8 - 11.8 x103/uL    RBC 3.65 3.63 - 4.92 x106/uL    HGB 9.1 (L) 10.9 - 14.3 g/dL    HCT 71.2 (L) 68.7 - 41.9 %    MCV 78.7 75.5 - 95.3 fL    MCH 25.0 24.7 - 32.8 pg    MCHC 31.8 (L) 32.3 - 35.6 g/dL    RDW 81.3 (H) 87.6 - 17.7 %    PLATELETS 145 140 - 440 x103/uL    MPV 10.1 7.9 - 10.8 fL    NEUTROPHIL % 70 43 - 77 %    LYMPHOCYTE % 18 16 - 46 %    MONOCYTE % 10 4 - 11 %    EOSINOPHIL % 1 1 - 7 %    BASOPHIL % 1 0 - 1 %    NEUTROPHIL # 3.90 1.90 - 8.20 x103/uL    LYMPHOCYTE #  1.00 (L) 1.10 - 3.10 x103/uL    MONOCYTE # 0.60 0.20 -  0.90 x103/uL    EOSINOPHIL # 0.00 0.00 - 0.50 x103/uL    BASOPHIL # 0.10 0.00 - 0.10 x103/uL   B-TYPE NATRIURETIC PEPTIDE (BNP),PLASMA   Result Value Ref Range    BNP 700 (H) 1 - 100 pg/mL   MAGNESIUM    Result Value Ref Range    MAGNESIUM  2.0 1.9 - 2.7 mg/dL   LACTIC ACID LEVEL W/ REFLEX FOR LEVEL >2.0   Result Value Ref Range    LACTIC ACID 0.9 0.5 - 2.2 mmol/L   BLOOD GAS W/ CO-OX, LYTES, LACTATE REFLEX Venous   Result Value Ref Range    %FIO2 (VENOUS) 32 %    PH (VENOUS) 7.47 (H) 7.32 - 7.43    PCO2 (VENOUS) 45 41 - 51 mm/Hg    PO2 (VENOUS) 57 35 - 50 mm/Hg    BICARBONATE (VENOUS) 31.2 (H) 22.0 - 29.0 mmol/L    BASE EXCESS 8.2 (H) 0.0 - 3.0 mmol/L    HEMOGLOBIN 9.2 (L) 12.0 - 18.0 g/dL    HEMATOCRITRT 28 (L) 37 - 50 %    OXYHEMOGLOBIN 87.2 40.0 - 80.0 %    CARBOXYHEMOGLOBIN 4.4 (H) <=3.0 %    MET-HEMOGLOBIN <0.7 <=1.5 %    O2CT 11.3 %    O2 SATURATION (VENOUS) 91.1 40.0 - 85.0 %    SODIUM 138 136 - 145 mmol/L    HEMOLYSIS None None, Mild    WHOLE BLOOD POTASSIUM 4.3 3.5 - 5.1 mmol/L    CHLORIDE 101 98 - 107 mmol/L    IONIZED CALCIUM  1.05 (L) 1.15 - 1.33 mmol/L    GLUCOSE 89 65 - 125 mg/dL    LACTATE 0.9 <=8.0 mmol/L   TROPONIN IN ONE HOUR   Result Value Ref Range    TROPONIN I 12 <15 ng/L     Imaging Studies:    No results found.  XR CHEST PA AND LATERAL   Final Result   Bibasilar atelectasis or pneumonia         Radiologist location ID: [id number]              DNR Status:  Prior    Assessment/Plan:   Active Hospital Problems    Diagnosis    Primary Problem: Pneumonia   Suspected PNA   Acute on chronic hypoxic respiratory failure  -CXR showing bibasilar atelectasis vs PNA   -titrate O2 to maintain O2 sat 89-92%  -neb treatments as needed   -incentive spirometry   -continue unasyn  and doxycycline    -mucinex        Melena   Chronic iron  deficiency anemia   -stool for occult blood   -monitor Hgb   -iron  studies reviewed   -Venofer  x 3 doses     Depression   -restart home medications once verified     History of  CHF though last two echos normal   -euvolemic at this time     -further treatment adjustments will be made based off clinical course.  See orders for further details.  -Hospitalist personally evaluated and examined the patient in conjunction with the MLP and agree with the assessments, treatment plan and disposition of the patient as recorded by the [facility].      DVT/PE Prophylaxis: Lovenox      Daily Orders (From admission, onward)       Start     Ordered    06/21/24 1900  enoxaparin  PF (LOVENOX ) 40 mg/0.4 mL  SubQ injection  (MEDIUM RISK VTE LEVEL)  EVERY 24 HOURS        Question:  Select Indication of Use  Answer:  VTE Prophylaxis    06/21/24 1838    06/21/24 1845  PT IS INTERMEDIATE RISK FOR VENOUS THROMBOEMBOLISM  (MEDIUM RISK VTE LEVEL)  CONTINUOUS        References:    VTE RISK ASSESSMENT TOOL    06/21/24 1838                  Anticoagulants (last 24 hours)       None             Anique Beckley D Gwenette Wellons, PA-C         [1]   Allergies  Allergen Reactions    Hydrocodone  Nausea/ Vomiting   [2]   Social History  Tobacco Use    Smoking status: Every Day     Current packs/day: 1.00     Types: Cigarettes    Smokeless tobacco: Never   Vaping Use    Vaping status: Never Used   Substance Use Topics    Alcohol  use: Not Currently    Drug use: Never

## 2024-06-21 NOTE — Respiratory Therapy (Signed)
 06/21/24 1900   CAT COPD Assessment Test   Cough 1   Phlegm 1   Chest Tightness 2   Breathlessness 5 (very breathless walking uphill or 1 flight of steps)   Limited activities at home 2   Confidence leaving home 1   Sleep 0 (sleep soundly)   Energy 3   Total CAT Score 15   CAT Impact Level 11-20 Medium   Nicotine  Dependence Fagerstrom Test   How soon after you wake up do you smoke your first cigarette? 0 (after 60 minutes)   Do you find it difficult to refrain from smoking in places where it is forbidden? 0 NO   Which cigarette would you hate most to give up? 0 Any other   How many cigarettes per day do you smoke? 1 (11-20)   Do you smoke more frequently during the first hours after awakening than the rest of the day? 0 NO   Do you smoke even if you are so ill that you are in bed most of the day? 1 YES   Nicotine  Dependence Fagerstrom Score 2   Fagerstrom Interpretation 0-2 Very Low Dependence

## 2024-06-21 NOTE — Student (Signed)
 Did EKG on pt. Also helped stabilize pts arm for ABG attempt, RT not successful in sticking pt. EKG given to provider.

## 2024-06-21 NOTE — Respiratory Therapy (Signed)
 Latest Reference Range & Units 06/21/24 19:22   O2CT % 12.5   HEMATOCRITRT 37 - 50 % 29 (L)   O2 SATURATION (VENOUS) 40.0 - 85.0 % 96.8   %FIO2 % 32   PH 7.32 - 7.43  7.40   PCO2 41 - 51 mm/Hg 55 (H)   PO2 35 - 50 mm/Hg 89   BICARBONATE 22.0 - 29.0 mmol/L 31.2 (H)   BASE EXCESS 0.0 - 3.0 mmol/L 8.1 (H)   CARBOXYHEMOGLOBIN <=3.0 % 3.8 (H)   HEMOGLOBIN 12.0 - 18.0 g/dL 9.5 (L)   MET-HEMOGLOBIN <=1.5 % 0.9   OXYHEMOGLOBIN 40.0 - 80.0 % 92.4   SODIUM 136 - 145 mmol/L 137   LACTATE <=1.9 mmol/L 1.2   CHLORIDE 98 - 107 mmol/L 101   GLUCOSE 65 - 125 mg/dL 831 (H)   IONIZED CALCIUM  1.15 - 1.33 mmol/L 1.05 (L)   HEMOLYSIS None, Mild  None   WHOLE BLOOD K+ 3.5 - 5.1 mmol/L 4.3   (L): Data is abnormally low  (H): Data is abnormally high  Notified Marney Sharps FNP

## 2024-06-21 NOTE — ED Provider Notes (Signed)
 [facility]  ED Primary Provider Note  Patient Name: Misty Johnson  Patient Age: 73 y.o.  Date of Birth: 21-Jan-1951    Chief Complaint: Abnormal Lab Result        History of Present Illness       Misty Johnson is a 73 y.o. female who had concerns including Abnormal Lab Result.     History of Present Illness  Misty Johnson is a 73 year old female with COPD who presents with low hemoglobin and breathing difficulties.    She has low hemoglobin levels, though the exact number is unknown. There is a history of cancerous polyps from a colonoscopy approximately fifteen years ago. Recently, she has noticed blood in her stool for about a week.    She has a history of COPD and experiences frequent wheezing. She uses breathing treatments up to four times a day. She has oxygen  at home, but there is concern that the tank may not be functioning properly. She feels unwell and is experiencing more difficulty breathing than usual.    She has a history of smoking, previously smoking a pack a day, but has reduced to half a pack a day or less. She denies any alcohol  or drug use.            Review of Systems     No other overt Review of Systems are noted to be positive except noted in the HPI.      Historical Data   History Reviewed This Encounter: Medical History  Surgical History  Family History  Social History      Physical Exam   ED Triage Vitals [06/21/24 1545]   BP (Non-Invasive) (!) 111/50   Heart Rate 88   Respiratory Rate 20   Temperature 36.7 C (98.1 F)   SpO2 (!) 76 %   Weight 78.5 kg (173 lb)   Height 1.646 m (5' 4.8)         Nursing notes reviewed for what could be assessed. Past Medical, Surgical, and Social history reviewed for what has been completed.     Constitutional: NAD. Well-Developed. Well Nourished.  Head: Normocephalic, atraumatic.  Mouth/Throat:  That has symmetric facial movement.  Eyes: EOM grossly intact, conjunctiva normal.  Neck: Supple  Cardiovascular: Regular Rate  and Rhythm, extremities well perfused.  Pulmonary/Chest:  Wheezing noted.  Lungs are symmetric to auscultation bilaterally.  Abdominal: Soft, non-tender, non-distended. Non peritoneal, no rebound, no guarding.  MSK: No Lower Extremity Edema.  Skin: Warm, dry.  Neuro: Appropriate, CN II-XII grossly intact.   Psych: Pleasant        Physical Exam  CHEST: Wheezing on auscultation of lungs.      Procedures      Patient Data     Labs Ordered/Reviewed   COMPREHENSIVE METABOLIC PANEL, NON-FASTING - Abnormal; Notable for the following components:       Result Value    CO2 TOTAL 33 (*)     ALBUMIN/GLOBULIN RATIO 1.7 (*)     CALCIUM , CORRECTED 8.8 (*)     All other components within normal limits    Narrative:     Estimated Glomerular Filtration Rate (eGFR) is calculated using the CKD-EPI (2021) equation, intended for patients 74 years of age and older. If gender is not documented or unknown, there will be no eGFR calculation.     IRON  TRANSFERRIN AND TIBC - Abnormal; Notable for the following components:    TOTAL IRON  BINDING CAPACITY 456 (*)  IRON  (TRANSFERRIN) SATURATION 5 (*)     IRON  23 (*)     UIBC 433 (*)     All other components within normal limits   CBC WITH DIFF - Abnormal; Notable for the following components:    HGB 9.1 (*)     HCT 28.7 (*)     MCHC 31.8 (*)     RDW 18.6 (*)     LYMPHOCYTE # 1.00 (*)     All other components within normal limits   B-TYPE NATRIURETIC PEPTIDE (BNP),PLASMA - Abnormal; Notable for the following components:    BNP 700 (*)     All other components within normal limits    Narrative:                                 Class 1: 101-250 pg/mL                              Class 2: 251-550 pg/mL                              Class 3: 551-900 pg/mL                              Class 4: >901 pg/mL     The [place]  [employer] has developed a four-stage functional classification system for CHF that is based on a subjective interpretation of the severity of a patient's clinical signs and  symptoms.    Class 1 - Patients have no limitations on physical activity and have no symptoms with ordinary physical activity.    Class 2 - Patients have a slight limitation of physical activity and have symptoms with ordinary physical activity.    Class 3 - Patients have a marked limitation of physical activity and have symptoms with less than ordinary physical activity, but not at rest.    Class 4 - Patients are unable to perform any physical activity without discomfort.   BLOOD GAS W/ CO-OX, LYTES, LACTATE REFLEX - Abnormal; Notable for the following components:    PH (VENOUS) 7.47 (*)     BICARBONATE (VENOUS) 31.2 (*)     BASE EXCESS 8.2 (*)     HEMOGLOBIN 9.2 (*)     HEMATOCRITRT 28 (*)     CARBOXYHEMOGLOBIN 4.4 (*)     IONIZED CALCIUM  1.05 (*)     All other components within normal limits    Narrative:     Results sent to dr cleatis  Manufacturer does not recommend venous sample for assessment of patient oxygenation status. A reference range for pO2, Oxyhemoglobin and Oxygen  Saturation is provided but abnormal results will not flag in Epic.   BLOOD GAS W/ CO-OX, LYTES, LACTATE REFLEX - Abnormal; Notable for the following components:    PCO2 (VENOUS) 55 (*)     BICARBONATE (VENOUS) 31.2 (*)     BASE EXCESS 8.1 (*)     HEMOGLOBIN 9.5 (*)     HEMATOCRITRT 29 (*)     CARBOXYHEMOGLOBIN 3.8 (*)     IONIZED CALCIUM  1.05 (*)     GLUCOSE 168 (*)     All other components within normal limits    Narrative:     Manufacturer does not recommend venous sample for assessment of  patient oxygenation status. A reference range for pO2, Oxyhemoglobin and Oxygen  Saturation is provided but abnormal results will not flag in Epic.   FERRITIN - Abnormal; Notable for the following components:    FERRITIN 7 (*)     All other components within normal limits   LIPASE - Normal   PT/INR - Normal    Narrative:     In the setting of warfarin therapy, a moderate-intensity INR goal range is 2.0 to 3.0 and a high-intensity INR goal range is 2.5  to 3.5.    INR is ONLY validated to determine the level of anticoagulation with vitamin K antagonists (warfarin). Other factors may elevate the INR including but not limited to direct oral anticoagulants (DOACs), liver dysfunction, vitamin K deficiency, DIC, factor deficiencies, and factor inhibitors.   PTT (PARTIAL THROMBOPLASTIN TIME) - Normal   TROPONIN-I - Normal   TROPONIN-I - Normal   TROPONIN-I - Normal   MAGNESIUM  - Normal   LACTIC ACID LEVEL W/ REFLEX FOR LEVEL >2.0 - Normal   TROPONIN-I - Normal   OCCULT BLOOD (PRN ED USE ONLY)   ADULT ROUTINE BLOOD CULTURE, SET OF 2 BOTTLES (BACTERIA AND YEAST)   ADULT ROUTINE BLOOD CULTURE, SET OF 2 BOTTLES (BACTERIA AND YEAST)   ADULT ROUTINE BLOOD CULTURE, SET OF 2 BOTTLES (BACTERIA AND YEAST)   ADULT ROUTINE BLOOD CULTURE, SET OF 2 BOTTLES (BACTERIA AND YEAST)   CBC/DIFF    Narrative:     The following orders were created for panel order CBC/DIFF.  Procedure                               Abnormality         Status                     ---------                               -----------         ------                     CBC WITH IPQQ[[id number]]                Abnormal            Final result                 Please view results for these tests on the individual orders.   RETICULOCYTE COUNT   URINALYSIS, MACROSCOPIC AND MICROSCOPIC W/CULTURE REFLEX    Narrative:     The following orders were created for panel order URINALYSIS, MACROSCOPIC AND MICROSCOPIC W/CULTURE REFLEX.  Procedure                               Abnormality         Status                     ---------                               -----------         ------                     ORELIA,  MACROSCOPIC[[id number]]                                                     URINALYSIS, MICROSCOPIC[[id number]]                                                       Please view results for these tests on the individual orders.   URINALYSIS, MACROSCOPIC   URINALYSIS, MICROSCOPIC   TYPE AND SCREEN       CT ABDOMEN PELVIS W  IV CONTRAST   Final Result by Edi, Radresults In (12/19 2011)   NO ACUTE FINDINGS AT THE ABDOMEN OR PELVIS ON CONTRAST-ENHANCED CT.         Radiologist location ID: [id number]         XR CHEST PA AND LATERAL   Final Result by Edi, Radresults In (12/19 1755)   Bibasilar atelectasis or pneumonia         Radiologist location ID: [id number]             Medical Decision Making          Medical Decision Making  Amount and/or Complexity of Data Reviewed  Labs: ordered.    Risk  Prescription drug management.  Decision regarding hospitalization.        Results          Studies Assessed:  Lab, EKG, radiology    EKG:   This independent EKG interpretation by that has noted below, this will be reviewed and documented separately by a cardiologist in the EMR.:    Rate:  65 beats per minute    Interpretation:  Irregular rhythm, No consistent ST Elevation, No Acute STEMI Identified.      MDM Narrative:  Medical Decision Making  A 73 year old female with a history of chronic obstructive pulmonary disease and prior colon cancerous polyps presented with increased dyspnea, wheezing, and low oxygen  saturation, possibly related to a malfunctioning home oxygen  tank. She also reported recent melena over the past week, raising concern for gastrointestinal bleeding and anemia. Physical exam revealed wheezing on lung auscultation. Smoking history includes reduction to half a pack per day.    Differential diagnosis includes, but is not limited to:  - Chronic obstructive pulmonary disease exacerbation: Increased dyspnea and wheezing in the setting of known COPD and ongoing tobacco use suggest an acute exacerbation, possibly worsened by suboptimal home oxygen  therapy.  - Anemia secondary to gastrointestinal bleeding: Recent melena and history of colon polyps raise concern for gastrointestinal blood loss as a cause of anemia.    Chronic obstructive pulmonary disease exacerbation  - Optimize bronchodilator therapy.  - Evaluate home oxygen   equipment for functionality.  - Encourage smoking cessation.    Anemia secondary to gastrointestinal bleeding  - Order complete blood count to determine hemoglobin level.        That has given treatment for Chronic Obstructive Pulmonary Disease.  Case was discussed with the hospitalist for admission evaluation.    Please see documentation above for specific labs and radiology.    Consent for AI Scribe software [employer] discussed/obtained verbally for encounter  Medications Ordered/Administered in the ED   NS flush syringe (3 mL Intracatheter Given 06/21/24 1647)   NS flush syringe (has no administration in time range)   NS 250 mL flush bag (has no administration in time range)     And   D5W 250 mL flush bag (has no administration in time range)   NS flush syringe (0 mL Intracatheter Not Given 06/21/24 1700)   NS flush syringe (has no administration in time range)   NS 250 mL flush bag (has no administration in time range)     And   D5W 250 mL flush bag (has no administration in time range)   NS flush syringe (has no administration in time range)   NS flush syringe (has no administration in time range)   NS 250 mL flush bag (has no administration in time range)     And   D5W 250 mL flush bag (has no administration in time range)   NS flush syringe (has no administration in time range)   NS flush syringe (has no administration in time range)   NS 250 mL flush bag (has no administration in time range)     And   D5W 250 mL flush bag (has no administration in time range)   methylPREDNISolone  sod succ (SOLU-medrol ) 125 mg/2 mL injection (125 mg Intravenous Given 06/21/24 1647)   ipratropium-albuterol  0.5 mg-3 mg(2.5 mg base)/3 mL Solution for Nebulization (9 mL Nebulization Given 06/21/24 1622)   doxycycline  tablet (has no administration in time range)       Patient will be admitted to the  service for further workup and management.    Disposition: Admitted             Risk as noted in the  medical decision-making is in reference to potential morbidity/mortality of management based upon previously established billing guidelines.    Based upon the clinical setting, the likely diagnosis/impression include:    Clinical Impression   History of chronic obstructive pulmonary disease (Primary)   Pneumonia         Current Discharge Medication List                This note was partially created using voice recognition software and is inherently subject to errors including those of syntax and sound alike  substitutions which may escape proof reading. In such instances, original meaning may be extrapolated by contextual derivation.      /R. Elspeth Griffin, MD, LYDIA PILLING  Department of Emergency Medicine  [facility]

## 2024-06-21 NOTE — Progress Notes (Signed)
 [facility]  Gastroenterology/ Hepatology Brief Progress Note      Misty Johnson  Date of service: 06/21/2024    Chart reviewed. Will plan for EGD/Colonoscopy once pneumonia and respiratory status improves.     Reginald Blanch, MD

## 2024-06-21 NOTE — Respiratory Therapy (Signed)
 Latest Reference Range & Units 06/21/24 16:18   O2 SATURATION (VENOUS) 40.0 - 85.0 % 91.1   %FIO2 % 32   PH 7.32 - 7.43  7.47 (H)   PCO2 41 - 51 mm/Hg 45   PO2 35 - 50 mm/Hg 57   BICARBONATE 22.0 - 29.0 mmol/L 31.2 (H)   BASE EXCESS 0.0 - 3.0 mmol/L 8.2 (H)   HEMATOCRITRT 37 - 50 % 28 (L)   CARBOXYHEMOGLOBIN <=3.0 % 4.4 (H)   HEMOGLOBIN 12.0 - 18.0 g/dL 9.2 (L)   MET-HEMOGLOBIN <=1.5 % <0.7   OXYHEMOGLOBIN 40.0 - 80.0 % 87.2   SODIUM 136 - 145 mmol/L 138   LACTATE <=1.9 mmol/L 0.9   CHLORIDE 98 - 107 mmol/L 101   GLUCOSE 65 - 125 mg/dL 89   IONIZED CALCIUM  1.15 - 1.33 mmol/L 1.05 (L)   HEMOLYSIS None, Mild  None   WHOLE BLOOD K+ 3.5 - 5.1 mmol/L 4.3   (H): Data is abnormally high  (L): Data is abnormally low    Results sent to dr cleatis

## 2024-06-22 ENCOUNTER — Inpatient Hospital Stay (HOSPITAL_COMMUNITY): Payer: Medicare (Managed Care)

## 2024-06-22 DIAGNOSIS — I4891 Unspecified atrial fibrillation: Secondary | ICD-10-CM

## 2024-06-22 DIAGNOSIS — R9431 Abnormal electrocardiogram [ECG] [EKG]: Secondary | ICD-10-CM

## 2024-06-22 DIAGNOSIS — R918 Other nonspecific abnormal finding of lung field: Secondary | ICD-10-CM

## 2024-06-22 LAB — BASIC METABOLIC PANEL
ANION GAP: 6 mmol/L (ref 4–13)
BUN/CREA RATIO: 21 (ref 6–22)
BUN: 17 mg/dL (ref 7–25)
CALCIUM: 8.5 mg/dL — ABNORMAL LOW (ref 8.6–10.3)
CHLORIDE: 106 mmol/L (ref 98–107)
CO2 TOTAL: 30 mmol/L (ref 21–31)
CREATININE: 0.8 mg/dL (ref 0.60–1.30)
ESTIMATED GFR: 77 mL/min/1.73mˆ2 (ref >59)
GLUCOSE: 143 mg/dL — ABNORMAL HIGH (ref 74–109)
OSMOLALITY, CALCULATED: 287 mosm/kg (ref 270–290)
POTASSIUM: 4.3 mmol/L (ref 3.5–5.1)
SODIUM: 142 mmol/L (ref 136–145)

## 2024-06-22 LAB — CBC WITH DIFF
BASOPHIL #: 0 x10ˆ3/uL (ref 0.00–0.10)
BASOPHIL %: 0 % (ref 0–1)
EOSINOPHIL #: 0 x10ˆ3/uL (ref 0.00–0.50)
EOSINOPHIL %: 0 % — ABNORMAL LOW (ref 1–7)
HCT: 25.7 % — ABNORMAL LOW (ref 31.2–41.9)
HGB: 8.2 g/dL — ABNORMAL LOW (ref 10.9–14.3)
LYMPHOCYTE #: 0.4 x10ˆ3/uL — ABNORMAL LOW (ref 1.10–3.10)
LYMPHOCYTE %: 9 % — ABNORMAL LOW (ref 16–46)
MCH: 25 pg (ref 24.7–32.8)
MCHC: 31.8 g/dL — ABNORMAL LOW (ref 32.3–35.6)
MCV: 78.4 fL (ref 75.5–95.3)
MONOCYTE #: 0.1 x10ˆ3/uL — ABNORMAL LOW (ref 0.20–0.90)
MONOCYTE %: 2 % — ABNORMAL LOW (ref 4–11)
MPV: 10.1 fL (ref 7.9–10.8)
NEUTROPHIL #: 4.4 x10ˆ3/uL (ref 1.90–8.20)
NEUTROPHIL %: 89 % — ABNORMAL HIGH (ref 43–77)
PLATELETS: 114 x10ˆ3/uL — ABNORMAL LOW (ref 140–440)
RBC: 3.27 x10ˆ6/uL — ABNORMAL LOW (ref 3.63–4.92)
RDW: 18.3 % — ABNORMAL HIGH (ref 12.3–17.7)
WBC: 4.9 x10ˆ3/uL (ref 3.8–11.8)

## 2024-06-22 LAB — ECG 12 LEAD
Calculated R Axis: 37 degrees
Calculated T Axis: -81 degrees
QRS Duration: 74 ms
QT Interval: 420 ms
QTC Calculation: 436 ms
Ventricular rate: 65 {beats}/min

## 2024-06-22 LAB — OCCULT BLOOD (PRN ED USE ONLY): OCCULT BLOOD: NEGATIVE

## 2024-06-22 LAB — C-REACTIVE PROTEIN (CRP): C-REACTIVE PROTEIN (CRP): <0.5 mg/dL (ref 0.1–0.5)

## 2024-06-22 MED ORDER — PANTOPRAZOLE 40 MG TABLET,DELAYED RELEASE
40.0000 mg | DELAYED_RELEASE_TABLET | Freq: Every day | ORAL | Status: DC
  Administered 2024-06-22 – 2024-06-23 (×2): 40 mg via ORAL
  Administered 2024-06-24: 0 mg via ORAL
  Administered 2024-06-25 – 2024-06-29 (×5): 40 mg via ORAL
  Filled 2024-06-22 (×8): qty 1

## 2024-06-22 MED ORDER — SODIUM CHLORIDE 0.9 % INTRAVENOUS SOLUTION
INTRAVENOUS | Status: AC
  Filled 2024-06-22: qty 50

## 2024-06-22 MED ORDER — METOPROLOL SUCCINATE ER 25 MG TABLET,EXTENDED RELEASE 24 HR
25.0000 mg | ORAL_TABLET | Freq: Every day | ORAL | Status: DC
  Administered 2024-06-22 – 2024-06-23 (×2): 25 mg via ORAL
  Administered 2024-06-24: 0 mg via ORAL
  Administered 2024-06-25 – 2024-06-28 (×4): 25 mg via ORAL
  Administered 2024-06-29: 0 mg via ORAL
  Filled 2024-06-22 (×8): qty 1

## 2024-06-22 MED ORDER — AMPICILLIN-SULBACTAM 3 GRAM SOLUTION FOR INJECTION
INTRAMUSCULAR | Status: AC
  Filled 2024-06-22: qty 8

## 2024-06-22 MED ORDER — FUROSEMIDE 40 MG TABLET: 40.0000 mg | ORAL_TABLET | Freq: Every day | ORAL | Status: DC

## 2024-06-22 MED ORDER — SODIUM CHLORIDE 0.9 % INTRAVENOUS SOLUTION
INTRAVENOUS | Status: AC
  Filled 2024-06-22: qty 100

## 2024-06-22 MED ORDER — ATORVASTATIN 40 MG TABLET
40.0000 mg | ORAL_TABLET | Freq: Every evening | ORAL | Status: DC
  Administered 2024-06-22 – 2024-06-28 (×7): 40 mg via ORAL
  Filled 2024-06-22 (×7): qty 1

## 2024-06-22 NOTE — Consults (Signed)
 Misty Johnson  Gastroenterology/ Hepatology Consult Note    Date of Service:  06/22/2024  Misty Johnson   74 y.o. female  Date of Admission:  06/21/2024  Date of Birth:  [birth year]  1     Reason for Consultation:    Anemia/melena    History of Present Illness:  Misty Johnson is a 73 y.o. White female who presented ti ER with shortness of breath and melena.   Hx anemia with no known cause. Hx pre-cancerous polyp on colonoscopy about 15 years ago. Hx COPD and is on home oxygen  and breathing treatments.   She has noticed melena for about a week.   She was evaluated in ER and had chest xray that showed pneumonia and was admitted and started on antibiotics.    Today she denies abdominal pain, nausea or vomiting. She denies heartburn. Still has shortness of breath and is on oxygen  via mask. She has been having melena for a few days. Denies diarrhea, constipation or bright red blood in stools.   She has never had EGD.       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 She has never used smokeless tobacco. She reports that she does not currently use alcohol . She reports that she does not use drugs.     REVIEW OF SYSTEMS:  Review of Systems   Constitutional: Negative for fever.      As per  HPI    Allergies[1]    Medications:  Medications Prior to Admission       Prescriptions    albuterol  sulfate (PROVENTIL ) 2.5 mg /3 mL (0.083 %) Inhalation nebulizer solution    Take 3 mL (2.5 mg total) by nebulization Once per day as needed for Wheezing    Patient not taking:  Reported on 06/22/2024    atorvastatin  (LIPITOR) 40 mg Oral Tablet    Take 1 Tablet (40 mg total) by mouth Every evening    ergocalciferol, vitamin D2, (DRISDOL) 1,250 mcg (50,000 unit) Oral Capsule    Take 1 Capsule (50,000 Units total) by mouth Every 7 days Saturday    escitalopram  oxalate (LEXAPRO ) 20 mg Oral Tablet    Take 1 Tablet (20 mg total) by mouth Daily    fluticasone -umeclidin-vilanter (TRELEGY ELLIPTA) 100-62.5-25 mcg Inhalation Disk with Device    Take 1 Inhalation by inhalation Daily    furosemide  (LASIX ) 40 mg Oral Tablet    Take 1 Tablet (40 mg total) by mouth Daily  ipratropium-albuterol  0.5 mg-3 mg(2.5 mg base)/3 mL Solution for Nebulization    Take 3 mL by nebulization Four times a day    Levocetirizine (XYZAL) 5 mg Oral Tablet    Take 1 Tablet (5 mg total) by mouth Every evening    metoprolol  succinate (TOPROL -XL) 25 mg Oral Tablet Sustained Release 24 hr    Take 1 Tablet (25 mg total) by mouth Daily    omeprazole (PRILOSEC) 20 mg Oral Capsule, Delayed Release(E.C.)    Take 1 Capsule (20 mg total) by mouth Daily    ondansetron  (ZOFRAN  ODT) 4 mg Oral Tablet, Rapid Dissolve    Take 1 Tablet (4 mg total) by mouth Every 8 hours as needed for Nausea/Vomiting    Patient not taking:  Reported on 06/22/2024    OXYGEN -AIR DELIVERY SYSTEMS DEACT    3L NC at home    polyethylene glycol (MIRALAX ) 17 gram Oral Powder in Packet    Take 2 Packets (34 g total) by mouth Twice daily    Patient not taking:  Reported on 06/22/2024    potassium chloride  (K-DUR) 20 mEq Oral Tab Sust.Rel. Particle/Crystal    Take 1 Tablet (20 mEq total) by mouth Daily    revefenacin  (YUPELRI ) 175 mcg/3 mL Inhalation nebulizer solution    Take 3 mL (175 mcg  total) by nebulization Daily          ampicillin -sulbactam (UNASYN ) 3 g in NS 100 mL IVPB with adaptor, 3 g, Intravenous, Q6H  atorvastatin  (LIPITOR) tablet, 40 mg, Oral, QPM  NS 250 mL flush bag, , Intravenous, Q15 Min PRN   And  D5W 250 mL flush bag, , Intravenous, Q15 Min PRN  NS 250 mL flush bag, , Intravenous, Q15 Min PRN   And  D5W 250 mL flush bag, , Intravenous, Q15 Min PRN  NS 250 mL flush bag, , Intravenous, Q15 Min PRN   And  D5W 250 mL flush bag, , Intravenous, Q15 Min PRN  NS 250 mL flush bag, , Intravenous, Q15 Min PRN   And  D5W 250 mL flush bag, , Intravenous, Q15 Min PRN  doxycycline  tablet, 100 mg, Oral, 2x/day  enoxaparin  PF (LOVENOX ) 40 mg/0.4 mL SubQ injection, 40 mg, Subcutaneous, Q24H  [Held by provider] furosemide  (LASIX ) tablet, 40 mg, Oral, Daily  guaiFENesin  (MUCINEX ) extended release tablet - for cough (expectorant), 600 mg, Oral, 2x/day  ipratropium-albuterol  0.5 mg-3 mg(2.5 mg base)/3 mL Solution for Nebulization, 3 mL, Nebulization, Q4H PRN  ipratropium-albuterol  0.5 mg-3 mg(2.5 mg base)/3 mL Solution for Nebulization, 3 mL, Nebulization, 4x/day  iron  sucrose (VENOFER ) 200 mg in NS 100 mL IVPB, 200 mg, Intravenous, Daily  metoprolol  succinate (TOPROL -XL) 24 hr extended release tablet, 25 mg, Oral, Daily  NS flush syringe, 3 mL, Intracatheter, Q8HRS  pantoprazole  (PROTONIX ) delayed release tablet, 40 mg, Oral, Daily  ramelteon  (ROZEREM ) tablet, 8 mg, Oral, NIGHTLY  sennosides-docusate sodium  (SENOKOT-S) 8.6-50mg  per tablet, 1 Tablet, Oral, 2x/day PRN        Physical Exam:    Vitals:  Temperature: 36.4 C (97.6 F)  Heart Rate: 76  Respiratory Rate: 18  BP (Non-Invasive): (!) 143/53  SpO2: 96 %    Exam:  Nursing note and vitals reviewed.   Objective:  General Appearance:  Comfortable, well-appearing and in no acute distress.    Vital signs: (most recent): Blood pressure (!) 143/53, pulse 76, temperature 36.4 C (97.6 F), resp. rate 18, height 1.626 m (5' 4), weight 74.5 kg (164 lb 3  oz), SpO2 96%.  Vital signs are normal.  No fever.    Lungs:  Normal effort and normal respiratory rate.  There are decreased breath sounds and wheezes.    Heart: Normal rate.  Regular rhythm.  S1 normal and S2 normal.    Abdomen: Abdomen is soft.  Bowel sounds are normal.   There is epigastric tenderness.    Extremities: Normal range of motion.    Pulses: Distal pulses are intact.    Neurological: Patient is alert and oriented to person, place and time.    Skin:  Warm and dry.        Labs:     Results for orders placed or performed during the hospital encounter of 06/21/24 (from the past 24 hours)   ECG 12 LEAD   Result Value Ref Range    Ventricular rate 65 BPM    QRS Duration 74 ms    QT Interval 420 ms    QTC Calculation 436 ms    Calculated R Axis 37 degrees    Calculated T Axis -81 degrees   COMPREHENSIVE METABOLIC PANEL, NON-FASTING   Result Value Ref Range    SODIUM 141 136 - 145 mmol/L    POTASSIUM 4.4 3.5 - 5.1 mmol/L    CHLORIDE 101 98 - 107 mmol/L    CO2 TOTAL 33 (H) 21 - 31 mmol/L    ANION GAP 7 4 - 13 mmol/L    BUN 17 7 - 25 mg/dL    CREATININE 9.16 9.39 - 1.30 mg/dL    BUN/CREA RATIO 20 6 - 22    ESTIMATED GFR 73 >59 mL/min/1.77m2    ALBUMIN 4.0 3.5 - 5.7 g/dL    CALCIUM  8.8 8.6 - 10.3 mg/dL    GLUCOSE 94 74 - 890 mg/dL    ALKALINE PHOSPHATASE 56 34 - 104 U/L    ALT (SGPT) 7 7 - 52 U/L    AST (SGOT) 17 13 - 39 U/L    BILIRUBIN TOTAL 0.7 0.3 - 1.0 mg/dL    PROTEIN TOTAL 6.4 6.4 - 8.9 g/dL    ALBUMIN/GLOBULIN RATIO 1.7 (H) 0.8 - 1.4    OSMOLALITY, CALCULATED 283 270 - 290 mOsm/kg    CALCIUM , CORRECTED 8.8 (L) 8.9 - 10.8 mg/dL    GLOBULIN 2.4 2.0 - 3.5   LIPASE   Result Value Ref Range    LIPASE 65 11 - 82 U/L   PT/INR   Result Value Ref Range    PROTHROMBIN TIME 12.3 9.8 - 12.7 seconds    INR 1.09 0.84 - 1.10   PTT (PARTIAL THROMBOPLASTIN TIME)   Result Value Ref Range    APTT 30.7 25.0 - 38.0 seconds   IRON  TRANSFERRIN AND TIBC   Result Value Ref Range    TOTAL IRON  BINDING CAPACITY 456 (H) 250 - 450  ug/dL    IRON  (TRANSFERRIN) SATURATION 5 (L) 15 - 50 %    IRON  23 (L) 50 - 212 ug/dL    TRANSFERRIN 673 796 - 362 mg/dL    UIBC 566 (H) 869 - 624 ug/dL   TYPE AND SCREEN   Result Value Ref Range    UNITS ORDERED NOT STATED     ABO/RH(D) B NEGATIVE     ANTIBODY SCREEN NEGATIVE     SPECIMEN EXPIRATION DATE 06/24/2024,2359    TROPONIN NOW   Result Value Ref Range    TROPONIN I 13 <15 ng/L   CBC WITH DIFF   Result Value Ref Range    WBC 5.5  3.8 - 11.8 x103/uL    RBC 3.65 3.63 - 4.92 x106/uL    HGB 9.1 (L) 10.9 - 14.3 g/dL    HCT 71.2 (L) 68.7 - 41.9 %    MCV 78.7 75.5 - 95.3 fL    MCH 25.0 24.7 - 32.8 pg    MCHC 31.8 (L) 32.3 - 35.6 g/dL    RDW 81.3 (H) 87.6 - 17.7 %    PLATELETS 145 140 - 440 x103/uL    MPV 10.1 7.9 - 10.8 fL    NEUTROPHIL % 70 43 - 77 %    LYMPHOCYTE % 18 16 - 46 %    MONOCYTE % 10 4 - 11 %    EOSINOPHIL % 1 1 - 7 %    BASOPHIL % 1 0 - 1 %    NEUTROPHIL # 3.90 1.90 - 8.20 x103/uL    LYMPHOCYTE # 1.00 (L) 1.10 - 3.10 x103/uL    MONOCYTE # 0.60 0.20 - 0.90 x103/uL    EOSINOPHIL # 0.00 0.00 - 0.50 x103/uL    BASOPHIL # 0.10 0.00 - 0.10 x103/uL   B-TYPE NATRIURETIC PEPTIDE (BNP),PLASMA   Result Value Ref Range    BNP 700 (H) 1 - 100 pg/mL   MAGNESIUM    Result Value Ref Range    MAGNESIUM  2.0 1.9 - 2.7 mg/dL   LACTIC ACID LEVEL W/ REFLEX FOR LEVEL >2.0   Result Value Ref Range    LACTIC ACID 0.9 0.5 - 2.2 mmol/L   TROPONIN-I   Result Value Ref Range    TROPONIN I 12 <15 ng/L   FERRITIN   Result Value Ref Range    FERRITIN 7 (L) 11 - 307 ng/mL   RETICULOCYTE COUNT   Result Value Ref Range    RETICULOCYTE % AUTOMATED 2.02 0.51 - 2.17 %    IMMATURE RETIC FRACTION 0.52 0.26 - 0.52    RETICULOCYTES COUNT # AUTOMATED     BLOOD GAS W/ CO-OX, LYTES, LACTATE REFLEX Venous   Result Value Ref Range    %FIO2 (VENOUS) 32 %    PH (VENOUS) 7.47 (H) 7.32 - 7.43    PCO2 (VENOUS) 45 41 - 51 mm/Hg    PO2 (VENOUS) 57 35 - 50 mm/Hg    BICARBONATE (VENOUS) 31.2 (H) 22.0 - 29.0 mmol/L    BASE EXCESS 8.2 (H) 0.0 - 3.0  mmol/L    HEMOGLOBIN 9.2 (L) 12.0 - 18.0 g/dL    HEMATOCRITRT 28 (L) 37 - 50 %    OXYHEMOGLOBIN 87.2 40.0 - 80.0 %    CARBOXYHEMOGLOBIN 4.4 (H) <=3.0 %    MET-HEMOGLOBIN <0.7 <=1.5 %    O2CT 11.3 %    O2 SATURATION (VENOUS) 91.1 40.0 - 85.0 %    SODIUM 138 136 - 145 mmol/L    HEMOLYSIS None None, Mild    WHOLE BLOOD POTASSIUM 4.3 3.5 - 5.1 mmol/L    CHLORIDE 101 98 - 107 mmol/L    IONIZED CALCIUM  1.05 (L) 1.15 - 1.33 mmol/L    GLUCOSE 89 65 - 125 mg/dL    LACTATE 0.9 <=8.0 mmol/L   TROPONIN IN ONE HOUR   Result Value Ref Range    TROPONIN I 12 <15 ng/L   TROPONIN IN THREE HOURS   Result Value Ref Range    TROPONIN I 11 <15 ng/L   BLOOD GAS W/ CO-OX, LYTES, LACTATE REFLEX Venous   Result Value Ref Range    %FIO2 (VENOUS) 32 %    PH (VENOUS)  7.40 7.32 - 7.43    PCO2 (VENOUS) 55 (H) 41 - 51 mm/Hg    PO2 (VENOUS) 89 35 - 50 mm/Hg    BICARBONATE (VENOUS) 31.2 (H) 22.0 - 29.0 mmol/L    BASE EXCESS 8.1 (H) 0.0 - 3.0 mmol/L    HEMOGLOBIN 9.5 (L) 12.0 - 18.0 g/dL    HEMATOCRITRT 29 (L) 37 - 50 %    OXYHEMOGLOBIN 92.4 40.0 - 80.0 %    CARBOXYHEMOGLOBIN 3.8 (H) <=3.0 %    MET-HEMOGLOBIN 0.9 <=1.5 %    O2CT 12.5 %    O2 SATURATION (VENOUS) 96.8 40.0 - 85.0 %    SODIUM 137 136 - 145 mmol/L    HEMOLYSIS None None, Mild    WHOLE BLOOD POTASSIUM 4.3 3.5 - 5.1 mmol/L    CHLORIDE 101 98 - 107 mmol/L    IONIZED CALCIUM  1.05 (L) 1.15 - 1.33 mmol/L    GLUCOSE 168 (H) 65 - 125 mg/dL    LACTATE 1.2 <=8.0 mmol/L   BASIC METABOLIC PANEL, NON-FASTING   Result Value Ref Range    SODIUM 142 136 - 145 mmol/L    POTASSIUM 4.3 3.5 - 5.1 mmol/L    CHLORIDE 106 98 - 107 mmol/L    CO2 TOTAL 30 21 - 31 mmol/L    ANION GAP 6 4 - 13 mmol/L    CALCIUM  8.5 (L) 8.6 - 10.3 mg/dL    GLUCOSE 856 (H) 74 - 109 mg/dL    BUN 17 7 - 25 mg/dL    CREATININE 9.19 9.39 - 1.30 mg/dL    BUN/CREA RATIO 21 6 - 22    ESTIMATED GFR 77 >59 mL/min/1.53m2    OSMOLALITY, CALCULATED 287 270 - 290 mOsm/kg   CBC WITH DIFF   Result Value Ref Range    WBC 4.9 3.8 - 11.8 x103/uL     RBC 3.27 (L) 3.63 - 4.92 x106/uL    HGB 8.2 (L) 10.9 - 14.3 g/dL    HCT 74.2 (L) 68.7 - 41.9 %    MCV 78.4 75.5 - 95.3 fL    MCH 25.0 24.7 - 32.8 pg    MCHC 31.8 (L) 32.3 - 35.6 g/dL    RDW 81.6 (H) 87.6 - 17.7 %    PLATELETS 114 (L) 140 - 440 x103/uL    MPV 10.1 7.9 - 10.8 fL    NEUTROPHIL % 89 (H) 43 - 77 %    LYMPHOCYTE % 9 (L) 16 - 46 %    MONOCYTE % 2 (L) 4 - 11 %    EOSINOPHIL % 0 (L) 1 - 7 %    BASOPHIL % 0 0 - 1 %    NEUTROPHIL # 4.40 1.90 - 8.20 x103/uL    LYMPHOCYTE # 0.40 (L) 1.10 - 3.10 x103/uL    MONOCYTE # 0.10 (L) 0.20 - 0.90 x103/uL    EOSINOPHIL # 0.00 0.00 - 0.50 x103/uL    BASOPHIL # 0.00 0.00 - 0.10 x103/uL   C-REACTIVE PROTEIN (CRP)   Result Value Ref Range    C-REACTIVE PROTEIN (CRP) <0.5 0.1 - 0.5 mg/dL   OCCULT BLOOD (PRN ED USE ONLY)    Specimen: Stool   Result Value Ref Range    OCCULT BLOOD Negative Negative       Imaging Studies:    Results for orders placed or performed during the hospital encounter of 06/21/24   XR CHEST PA AND LATERAL     Status: None    Narrative  Jonay Rasool    RADIOLOGIST: Jacob Sechrist    XR CHEST PA AND LATERAL performed on 06/21/2024 5:49 PM    CLINICAL HISTORY: shortness of breath  sob    TECHNIQUE: Frontal and lateral views of the chest.    COMPARISON:  Chest radiograph dated 02/14/2024    FINDINGS:    Heart size is mildly enlarged.    The mediastinal contour is unremarkable.  Bibasilar opacities have developed.   T8 compression deformity is unchanged with associated exaggerated thoracic kyphosis        Impression    Bibasilar atelectasis or pneumonia      Radiologist location ID: [id number]     CT ABDOMEN PELVIS W IV CONTRAST     Status: None    Narrative    Oliviana Broadhead    RADIOLOGIST: Lang Speaks    CT ABDOMEN PELVIS W IV CONTRAST performed on 06/21/2024 7:47 PM    CLINICAL HISTORY: melena, hx of cancerous polyps  melena, hx of cancerous polyps, blood in stool surgical hx of appendectomy    TECHNIQUE:  Abdomen and pelvis CT with  intravenous contrast.  IV CONTRAST: 75 ml's of Omnipaque  350    COMPARISON:    CT abdomen and pelvis dated 03/09/2023    FINDINGS:  Lung bases: Mild dependent atelectasis    Liver:   Unremarkable.    Gallbladder:   Unremarkable.    Spleen:   Unremarkable.    Pancreas:   Unremarkable.    Adrenals:   Unremarkable.    Kidneys:     Left renal midpole cyst measures 3.2 cm. Normal right kidney.    Bladder:  Unremarkable.    Uterus and Adnexa:  Unremarkable.    Bowel:   Unremarkable.    Appendix:  The appendix is surgically absent.    Lymph nodes:  No suspicious lymph node enlargement.    Vasculature:   Extensive calcified atherosclerosis infrarenal abdominal aorta.     Peritoneum / Retroperitoneum: No ascites.  No free air.    Bones:   Unremarkable.        Impression    NO ACUTE FINDINGS AT THE ABDOMEN OR PELVIS ON CONTRAST-ENHANCED CT.      Radiologist location ID: [id number]     XR CHEST PA AND LATERAL     Status: None    Narrative    Janelly Caslin    RADIOLOGIST: Rodgers Norleen Langdon, MD    XR CHEST PA AND LATERAL performed on 06/22/2024 10:07 AM    CLINICAL HISTORY: Hypoxia/Resp Failure  Hypoxia/Resp Failure    TECHNIQUE: Frontal and lateral views of the chest.    COMPARISON:  06/21/2024    FINDINGS:    Heart and mediastinum are stable.    Lungs are stable redemonstrating bibasilar opacities that developed on the previous exam. .    T8 compression fracture similar to previous imaging.      Impression    NO CHANGE FROM THE PREVIOUS STUDY.        Radiologist location ID: [id number]          Assessment/Plan:     Anemia  Pneumonia    Agree with current management and supportive care. Continue to monitor H&H and for signs GI bleeding. Hgb 8.2 today down from 9.1 yesterday. Transfuse as needed to maintain Hgb at safe level. Iron  saturation is 5. Chest xray showed pneumonia and antibiotics have been started. CT abdomen unremarkable. Plan EGD and colonoscopy once pneumonia and respiratory status improves. Will continue to  follow. Further recommendations based on clinical course. Patient has been discussed in detail with Dr MARLA Blanch and treatment plan decided by him. Thank you for this consultation.   Suzen Jenkins Signs, APRN, CNP           [1]   Allergies  Allergen Reactions    Hydrocodone  Nausea/ Vomiting

## 2024-06-22 NOTE — Care Plan (Signed)
 Patient admitted with Pneumonia. Oxymask at [facility]. NS@75mls /hr. IV abx and iron  infusions as per orders. Patient states shortness of breath as well as labored breathing. External catheter placed due to increased respiratory work and shortness of breath. Incontinence care provided as needed. Oriented to unit and all needs placed within reach. Education and discharge planning ongoing.

## 2024-06-22 NOTE — Progress Notes (Signed)
 [facility]               IP PROGRESS NOTE      Misty Johnson, Runkles  Date of Admission:  06/21/2024  Date of Birth:  06/20/1951  Date of Service:  06/22/2024    Hospital Day:  LOS: 1 day     Subjective:   Misty Johnson is a 73 y/o female who is being seen in f/u for suspected PNA, acute on chronic hypoxic respiratory failure, melena.  Patient is on 4L OM.  Denies worsening SOB .   Saturating 96%  O2 turned down to 3L.   GI has evaluated and deferred EGD until respiratory status improves.   Lost IV site.   CCU nurses in room to assess for new site.  Venofer  ordered.  Hgb 8.2 this am  Denies any bloody stools this am.       Vital Signs:  Temp (24hrs) Max:37.2 C (98.9 F)      Temperature: 36.4 C (97.6 F)  BP (Non-Invasive): (!) 143/53  MAP (Non-Invasive): 73 mmHG  Heart Rate: 76  Respiratory Rate: 18  SpO2: 96 %    Current Medications:  ampicillin -sulbactam (UNASYN ) 3 g in NS 100 mL IVPB with adaptor, 3 g, Intravenous, Q6H  atorvastatin  (LIPITOR) tablet, 40 mg, Oral, QPM  NS 250 mL flush bag, , Intravenous, Q15 Min PRN   And  D5W 250 mL flush bag, , Intravenous, Q15 Min PRN  NS 250 mL flush bag, , Intravenous, Q15 Min PRN   And  D5W 250 mL flush bag, , Intravenous, Q15 Min PRN  NS 250 mL flush bag, , Intravenous, Q15 Min PRN   And  D5W 250 mL flush bag, , Intravenous, Q15 Min PRN  NS 250 mL flush bag, , Intravenous, Q15 Min PRN   And  D5W 250 mL flush bag, , Intravenous, Q15 Min PRN  doxycycline  tablet, 100 mg, Oral, 2x/day  enoxaparin  PF (LOVENOX ) 40 mg/0.4 mL SubQ injection, 40 mg, Subcutaneous, Q24H  [Held by provider] furosemide  (LASIX ) tablet, 40 mg, Oral, Daily  guaiFENesin  (MUCINEX ) extended release tablet - for cough (expectorant), 600 mg, Oral, 2x/day  ipratropium-albuterol  0.5 mg-3 mg(2.5 mg base)/3 mL Solution for Nebulization, 3 mL, Nebulization, Q4H PRN  ipratropium-albuterol  0.5 mg-3 mg(2.5 mg base)/3 mL Solution for Nebulization, 3 mL, Nebulization, 4x/day  iron  sucrose (VENOFER ) 200 mg  in NS 100 mL IVPB, 200 mg, Intravenous, Daily  metoprolol  succinate (TOPROL -XL) 24 hr extended release tablet, 25 mg, Oral, Daily  NS flush syringe, 3 mL, Intracatheter, Q8HRS  pantoprazole  (PROTONIX ) delayed release tablet, 40 mg, Oral, Daily  ramelteon  (ROZEREM ) tablet, 8 mg, Oral, NIGHTLY  sennosides-docusate sodium  (SENOKOT-S) 8.6-50mg  per tablet, 1 Tablet, Oral, 2x/day PRN        Current Orders:  Active Orders   Lab    URINALYSIS, MACROSCOPIC     Frequency: Once     Number of Occurrences: 1 Occurrences    URINALYSIS, MACROSCOPIC AND MICROSCOPIC W/CULTURE REFLEX     Frequency: ONE TIME     Number of Occurrences: 1 Occurrences    URINALYSIS, MICROSCOPIC     Frequency: Once     Number of Occurrences: 1 Occurrences   Diet    DIET REGULAR Do you want to initiate MNT Protocol? Yes     Frequency: All Meals     Number of Occurrences: 1 Occurrences   Nursing    ACTIVITY     Frequency: UNTIL DISCONTINUED     Number of Occurrences:  Until Specified    BLADDER SCAN IF NO URINE IN 8 HR PERIOD     Frequency: Q6H PRN     Number of Occurrences: Until Specified    COORDINATE LABS/BLOOD DRAWS WITH PHLEBOTOMY TO PREVENT INTERRUPTIONS TO LIMITED ACTIVITY TIME     Linked Order: And     Frequency: UNTIL DISCONTINUED     Number of Occurrences: Until Specified    ENSURE SENSORY AIDS IN PLACE (GLASSES, HEARING AIDS, DENTURES) TEMPERATURE AND BEDDING ADJUSTMENTS     Frequency: UNTIL DISCONTINUED     Number of Occurrences: Until Specified    In the first hour after the full bolus is completed, recheck BP every 15 min until normotension achieved (SBP > 90 or MAP > 65). Notify Provider for vasopressor order if normotension not achieved within the hour.     Frequency: ONE TIME     Number of Occurrences: 1 Occurrences    In the first hour after the full bolus is completed, recheck BP every 15 min until normotension achieved (SBP > 90 or MAP > 65). Notify Provider for vasopressor order if normotension not achieved within the hour.      Frequency: ONE TIME     Number of Occurrences: 1 Occurrences    INCENTIVE SPIROMETRY NURSING     Frequency: Q1H WA     Number of Occurrences: 250 Occurrences    INTAKE AND OUTPUT QSHIFT     Frequency: QSHIFT     Number of Occurrences: Until Specified    INTAKE AND OUTPUT QSHIFT     Frequency: QSHIFT     Number of Occurrences: Until Specified    INTAKE AND OUTPUT QSHIFT     Frequency: QSHIFT     Number of Occurrences: Until Specified    IV fluids already ordered/administered OR patient has contraindication to initial sepsis IV fluid bolus - document reason in order questions     Frequency: CONTINUOUS     Number of Occurrences: Until Specified    IV fluids already ordered/administered OR patient has contraindication to initial sepsis IV fluid bolus - document reason in order questions     Frequency: CONTINUOUS     Number of Occurrences: Until Specified    MD TO NURSE:  IF YOU ANTICIPATE DIFFICULTY OR ARE UNBLE TO OBTAIN CULTURES WITHIN 30 MIN OF THIS ORDER, PLEASE CONTACT THE ORDERING PROVIDER FOR FURTHER DIRECTION.  TIMELY ANTIBIOTIC ADMINISTRATION IS IMPERATIVE TO TREATING A PATIENT WITH SEPSIS.     Frequency: UNTIL DISCONTINUED     Number of Occurrences: Until Specified    MD TO NURSE:  IF YOU ANTICIPATE DIFFICULTY OR ARE UNBLE TO OBTAIN CULTURES WITHIN 30 MIN OF THIS ORDER, PLEASE CONTACT THE ORDERING PROVIDER FOR FURTHER DIRECTION.  TIMELY ANTIBIOTIC ADMINISTRATION IS IMPERATIVE TO TREATING A PATIENT WITH SEPSIS.     Frequency: UNTIL DISCONTINUED     Number of Occurrences: Until Specified    MD TO NURSE: IF UNABLE TO READILY OBTAIN ADEQUATE IV ACCESS WITHIN 15 MINUTES OF THIS ORDER, PLEASE NOTIFY PROVIDER IMMEDIATELY FOR FURTHER DIRECTION.     Frequency: UNTIL DISCONTINUED     Number of Occurrences: Until Specified    MD TO NURSE: IF UNABLE TO READILY OBTAIN ADEQUATE IV ACCESS WITHIN 15 MINUTES OF THIS ORDER, PLEASE NOTIFY PROVIDER IMMEDIATELY FOR FURTHER DIRECTION.     Frequency: UNTIL DISCONTINUED     Number of  Occurrences: Until Specified    MINIMAL PATIENT INTERRUPTIONS BETWEEN 11PM and 6AM     Linked Order: And  Frequency: UNTIL DISCONTINUED     Number of Occurrences: Until Specified     Order Comments: This includes, but is not limited to, the following: vital signs, labs, and I&O documentation.      Notify MD Vital Signs     Frequency: PRN     Number of Occurrences: Until Specified    PLEASE ALLOW FAMILY PRESENCE, PHOTOS OR PERSONALIZED ITEMS FROM HOME IF AVAILABLE     Frequency: UNTIL DISCONTINUED     Number of Occurrences: Until Specified    PLEASE OBTAIN VITAL SIGNS BEFORE 11PM     Linked Order: And     Frequency: UNTIL DISCONTINUED     Number of Occurrences: Until Specified    PT IS INTERMEDIATE RISK FOR VENOUS THROMBOEMBOLISM     Frequency: CONTINUOUS     Number of Occurrences: Until Specified    PULSE OXIMETRY CONTINUOUS     Frequency: CONTINUOUS     Number of Occurrences: Until Specified    PULSE OXIMETRY CONTINUOUS     Frequency: CONTINUOUS     Number of Occurrences: Until Specified    PULSE OXIMETRY Q4H     Frequency: Q4H     Number of Occurrences: Until Specified    Small Volume Medication Infusion Line Flush     Linked Order: And     Frequency: UNTIL DISCONTINUED     Number of Occurrences: Until Specified     Order Comments: All intermittent infusions/IVPBs will be run as a secondary infusion and must be flushed to ensure the patient receives the entire dose. Administer and document on the corresponding primary flush order that matches the base solution of the intermittent infusion/IVPB. An iso-osmotic solution is compatible and may be flushed with D5W or NS. Page the service pharmacist with any questions.      Small Volume Medication Infusion Line Flush     Linked Order: And     Frequency: UNTIL DISCONTINUED     Number of Occurrences: Until Specified     Order Comments: All intermittent infusions/IVPBs will be run as a secondary infusion and must be flushed to ensure the patient receives the entire  dose. Administer and document on the corresponding primary flush order that matches the base solution of the intermittent infusion/IVPB. An iso-osmotic solution is compatible and may be flushed with D5W or NS. Page the service pharmacist with any questions.      Small Volume Medication Infusion Line Flush     Linked Order: And     Frequency: UNTIL DISCONTINUED     Number of Occurrences: Until Specified     Order Comments: All intermittent infusions/IVPBs will be run as a secondary infusion and must be flushed to ensure the patient receives the entire dose. Administer and document on the corresponding primary flush order that matches the base solution of the intermittent infusion/IVPB. An iso-osmotic solution is compatible and may be flushed with D5W or NS. Page the service pharmacist with any questions.      Small Volume Medication Infusion Line Flush     Linked Order: And     Frequency: UNTIL DISCONTINUED     Number of Occurrences: Until Specified     Order Comments: All intermittent infusions/IVPBs will be run as a secondary infusion and must be flushed to ensure the patient receives the entire dose. Administer and document on the corresponding primary flush order that matches the base solution of the intermittent infusion/IVPB. An iso-osmotic solution is compatible and may be flushed with D5W or NS.  Page the service pharmacist with any questions.      TELEMETRY MONITORING     Frequency: CONTINUOUS     Number of Occurrences: Until Specified    VISUAL CHECKS BY RN Q2 HOURS UNLESS INITIAL ASSESSMENT HAS NOT BEEN COMPLETED     Linked Order: And     Frequency: UNTIL DISCONTINUED     Number of Occurrences: Until Specified    VITAL SIGNS  Q2H     Frequency: Q2H     Number of Occurrences: 250 Occurrences    VITAL SIGNS  Q2H     Frequency: Q2H     Number of Occurrences: 250 Occurrences    VITAL SIGNS  Q4H     Frequency: Q4H     Number of Occurrences: Until Specified    WEIGH PATIENT     Frequency: UPON ADMISSION      Number of Occurrences: 1 Occurrences    WEIGH PATIENT     Frequency: UPON ADMISSION     Number of Occurrences: 1 Occurrences   Consult    IP CONSULT TO GASTROENTEROLOGY Requested Provider; TOBIE HITT     Frequency: ONE TIME     Number of Occurrences: 1 Occurrences   OT    OT EVAL & TREAT     Frequency: Per Therapist Discretion     Number of Occurrences: 1 Occurrences     Scheduling Instructions:               PT    PT EVALUATE AND TREAT     Frequency: Per Therapist Discretion     Number of Occurrences: 1 Occurrences     Order Comments: If patient's O2 level drops, PT may increase O2 until sats are > 93%.       Scheduling Instructions:               Respiratory Care    AIRWAY CLEARANCE - METANEB/VOLARA THERAPY QID     Frequency: QID     Number of Occurrences: Until Specified    OXYGEN  - NASAL CANNULA     Frequency: CONTINUOUS     Number of Occurrences: Until Specified     Order Comments: Flowrate should not exeed 6L/min except [facility] facility.           RESPIRATORY CARE EVALUATION     Frequency: ONE TIME     Number of Occurrences: 1 Occurrences     Order Comments: Specify Order Details:     IV    INSERT & MAINTAIN PERIPHERAL IV ACCESS     Frequency: UNTIL DISCONTINUED     Number of Occurrences: Until Specified    INSERT & MAINTAIN PERIPHERAL IV ACCESS     Frequency: UNTIL DISCONTINUED     Number of Occurrences: Until Specified    INSERT & MAINTAIN PERIPHERAL IV ACCESS     Frequency: UNTIL DISCONTINUED     Number of Occurrences: Until Specified    INSERT & MAINTAIN PERIPHERAL IV ACCESS     Frequency: UNTIL DISCONTINUED     Number of Occurrences: Until Specified    PERIPHERAL IV DRESSING CHANGE     Frequency: PRN     Number of Occurrences: Until Specified    PERIPHERAL IV DRESSING CHANGE     Frequency: PRN     Number of Occurrences: Until Specified    PERIPHERAL IV DRESSING CHANGE     Frequency: PRN     Number of Occurrences: Until Specified    PERIPHERAL IV DRESSING CHANGE  Frequency: PRN     Number of  Occurrences: Until Specified   Medications    ampicillin -sulbactam (UNASYN ) 3 g in NS 100 mL IVPB with adaptor     Frequency: Q6H     Dose: 3 g     Route: Intravenous    atorvastatin  (LIPITOR) tablet     Frequency: QPM     Dose: 40 mg     Route: Oral    D5W 250 mL flush bag     Linked Order: And     Frequency: Q15 Min PRN     Route: Intravenous    D5W 250 mL flush bag     Linked Order: And     Frequency: Q15 Min PRN     Route: Intravenous    D5W 250 mL flush bag     Linked Order: And     Frequency: Q15 Min PRN     Route: Intravenous    D5W 250 mL flush bag     Linked Order: And     Frequency: Q15 Min PRN     Route: Intravenous    doxycycline  tablet     Frequency: 2x/day     Dose: 100 mg     Route: Oral    enoxaparin  PF (LOVENOX ) 40 mg/0.4 mL SubQ injection     Frequency: Q24H     Dose: 40 mg     Route: Subcutaneous    furosemide  (LASIX ) tablet     Frequency: Daily     Dose: 40 mg     Route: Oral    guaiFENesin  (MUCINEX ) extended release tablet - for cough (expectorant)     Frequency: 2x/day     Dose: 600 mg     Route: Oral    ipratropium-albuterol  0.5 mg-3 mg(2.5 mg base)/3 mL Solution for Nebulization     Frequency: Q4H PRN     Dose: 3 mL     Route: Nebulization    ipratropium-albuterol  0.5 mg-3 mg(2.5 mg base)/3 mL Solution for Nebulization     Frequency: 4x/day     Dose: 3 mL     Route: Nebulization    iron  sucrose (VENOFER ) 200 mg in NS 100 mL IVPB     Frequency: Daily     Dose: 200 mg     Route: Intravenous    metoprolol  succinate (TOPROL -XL) 24 hr extended release tablet     Frequency: Daily     Dose: 25 mg     Route: Oral    NS 250 mL flush bag     Linked Order: And     Frequency: Q15 Min PRN     Route: Intravenous    NS 250 mL flush bag     Linked Order: And     Frequency: Q15 Min PRN     Route: Intravenous    NS 250 mL flush bag     Linked Order: And     Frequency: Q15 Min PRN     Route: Intravenous    NS 250 mL flush bag     Linked Order: And     Frequency: Q15 Min PRN     Route: Intravenous    NS flush  syringe     Frequency: Q8HRS     Dose: 3 mL     Route: Intracatheter    pantoprazole  (PROTONIX ) delayed release tablet     Frequency: Daily     Dose: 40 mg     Route: Oral    ramelteon  (ROZEREM )  tablet     Frequency: NIGHTLY     Dose: 8 mg     Route: Oral    sennosides-docusate sodium  (SENOKOT-S) 8.6-50mg  per tablet     Frequency: 2x/day PRN     Dose: 1 Tablet     Route: Oral        Review of Systems:  Focused review of system was completed. Refer to the HPI for ROS details.     Today's Physical Exam:  Physical Exam  Constitutional:       General: She is not in acute distress.     Appearance: She is not ill-appearing.   HENT:      Head: Normocephalic and atraumatic.   Cardiovascular:      Rate and Rhythm: Normal rate and regular rhythm.   Pulmonary:      Effort: Pulmonary effort is normal. No respiratory distress.   Abdominal:      General: Bowel sounds are normal.      Palpations: Abdomen is soft.   Skin:     General: Skin is warm.   Neurological:      General: No focal deficit present.      Mental Status: She is alert.   Psychiatric:         Mood and Affect: Mood normal.        I/O:  I/O last 24 hours:    Intake/Output Summary (Last 24 hours) at 06/22/2024 1340  Last data filed at 06/22/2024 1236  Gross per 24 hour   Intake 531 ml   Output --   Net 531 ml     I/O current shift:  12/20 0700 - 12/20 1859  In: 100   Out: -   Labs:  Reviewed: I have reviewed all lab results.  Lab Results Today:    Results for orders placed or performed during the hospital encounter of 06/21/24 (from the past 24 hours)   ECG 12 LEAD   Result Value Ref Range    Ventricular rate 65 BPM    QRS Duration 74 ms    QT Interval 420 ms    QTC Calculation 436 ms    Calculated R Axis 37 degrees    Calculated T Axis -81 degrees   COMPREHENSIVE METABOLIC PANEL, NON-FASTING   Result Value Ref Range    SODIUM 141 136 - 145 mmol/L    POTASSIUM 4.4 3.5 - 5.1 mmol/L    CHLORIDE 101 98 - 107 mmol/L    CO2 TOTAL 33 (H) 21 - 31 mmol/L    ANION GAP 7 4 - 13  mmol/L    BUN 17 7 - 25 mg/dL    CREATININE 9.16 9.39 - 1.30 mg/dL    BUN/CREA RATIO 20 6 - 22    ESTIMATED GFR 73 >59 mL/min/1.56m2    ALBUMIN 4.0 3.5 - 5.7 g/dL    CALCIUM  8.8 8.6 - 10.3 mg/dL    GLUCOSE 94 74 - 890 mg/dL    ALKALINE PHOSPHATASE 56 34 - 104 U/L    ALT (SGPT) 7 7 - 52 U/L    AST (SGOT) 17 13 - 39 U/L    BILIRUBIN TOTAL 0.7 0.3 - 1.0 mg/dL    PROTEIN TOTAL 6.4 6.4 - 8.9 g/dL    ALBUMIN/GLOBULIN RATIO 1.7 (H) 0.8 - 1.4    OSMOLALITY, CALCULATED 283 270 - 290 mOsm/kg    CALCIUM , CORRECTED 8.8 (L) 8.9 - 10.8 mg/dL    GLOBULIN 2.4 2.0 - 3.5   LIPASE  Result Value Ref Range    LIPASE 65 11 - 82 U/L   PT/INR   Result Value Ref Range    PROTHROMBIN TIME 12.3 9.8 - 12.7 seconds    INR 1.09 0.84 - 1.10   PTT (PARTIAL THROMBOPLASTIN TIME)   Result Value Ref Range    APTT 30.7 25.0 - 38.0 seconds   IRON  TRANSFERRIN AND TIBC   Result Value Ref Range    TOTAL IRON  BINDING CAPACITY 456 (H) 250 - 450 ug/dL    IRON  (TRANSFERRIN) SATURATION 5 (L) 15 - 50 %    IRON  23 (L) 50 - 212 ug/dL    TRANSFERRIN 673 796 - 362 mg/dL    UIBC 566 (H) 869 - 624 ug/dL   TYPE AND SCREEN   Result Value Ref Range    UNITS ORDERED NOT STATED     ABO/RH(D) B NEGATIVE     ANTIBODY SCREEN NEGATIVE     SPECIMEN EXPIRATION DATE 06/24/2024,2359    TROPONIN NOW   Result Value Ref Range    TROPONIN I 13 <15 ng/L   CBC WITH DIFF   Result Value Ref Range    WBC 5.5 3.8 - 11.8 x103/uL    RBC 3.65 3.63 - 4.92 x106/uL    HGB 9.1 (L) 10.9 - 14.3 g/dL    HCT 71.2 (L) 68.7 - 41.9 %    MCV 78.7 75.5 - 95.3 fL    MCH 25.0 24.7 - 32.8 pg    MCHC 31.8 (L) 32.3 - 35.6 g/dL    RDW 81.3 (H) 87.6 - 17.7 %    PLATELETS 145 140 - 440 x103/uL    MPV 10.1 7.9 - 10.8 fL    NEUTROPHIL % 70 43 - 77 %    LYMPHOCYTE % 18 16 - 46 %    MONOCYTE % 10 4 - 11 %    EOSINOPHIL % 1 1 - 7 %    BASOPHIL % 1 0 - 1 %    NEUTROPHIL # 3.90 1.90 - 8.20 x103/uL    LYMPHOCYTE # 1.00 (L) 1.10 - 3.10 x103/uL    MONOCYTE # 0.60 0.20 - 0.90 x103/uL    EOSINOPHIL # 0.00 0.00 - 0.50  x103/uL    BASOPHIL # 0.10 0.00 - 0.10 x103/uL   B-TYPE NATRIURETIC PEPTIDE (BNP),PLASMA   Result Value Ref Range    BNP 700 (H) 1 - 100 pg/mL   MAGNESIUM    Result Value Ref Range    MAGNESIUM  2.0 1.9 - 2.7 mg/dL   LACTIC ACID LEVEL W/ REFLEX FOR LEVEL >2.0   Result Value Ref Range    LACTIC ACID 0.9 0.5 - 2.2 mmol/L   TROPONIN-I   Result Value Ref Range    TROPONIN I 12 <15 ng/L   FERRITIN   Result Value Ref Range    FERRITIN 7 (L) 11 - 307 ng/mL   RETICULOCYTE COUNT   Result Value Ref Range    RETICULOCYTE % AUTOMATED 2.02 0.51 - 2.17 %    IMMATURE RETIC FRACTION 0.52 0.26 - 0.52    RETICULOCYTES COUNT # AUTOMATED     BLOOD GAS W/ CO-OX, LYTES, LACTATE REFLEX Venous   Result Value Ref Range    %FIO2 (VENOUS) 32 %    PH (VENOUS) 7.47 (H) 7.32 - 7.43    PCO2 (VENOUS) 45 41 - 51 mm/Hg    PO2 (VENOUS) 57 35 - 50 mm/Hg    BICARBONATE (VENOUS) 31.2 (H) 22.0 - 29.0 mmol/L  BASE EXCESS 8.2 (H) 0.0 - 3.0 mmol/L    HEMOGLOBIN 9.2 (L) 12.0 - 18.0 g/dL    HEMATOCRITRT 28 (L) 37 - 50 %    OXYHEMOGLOBIN 87.2 40.0 - 80.0 %    CARBOXYHEMOGLOBIN 4.4 (H) <=3.0 %    MET-HEMOGLOBIN <0.7 <=1.5 %    O2CT 11.3 %    O2 SATURATION (VENOUS) 91.1 40.0 - 85.0 %    SODIUM 138 136 - 145 mmol/L    HEMOLYSIS None None, Mild    WHOLE BLOOD POTASSIUM 4.3 3.5 - 5.1 mmol/L    CHLORIDE 101 98 - 107 mmol/L    IONIZED CALCIUM  1.05 (L) 1.15 - 1.33 mmol/L    GLUCOSE 89 65 - 125 mg/dL    LACTATE 0.9 <=8.0 mmol/L   TROPONIN IN ONE HOUR   Result Value Ref Range    TROPONIN I 12 <15 ng/L   TROPONIN IN THREE HOURS   Result Value Ref Range    TROPONIN I 11 <15 ng/L   BLOOD GAS W/ CO-OX, LYTES, LACTATE REFLEX Venous   Result Value Ref Range    %FIO2 (VENOUS) 32 %    PH (VENOUS) 7.40 7.32 - 7.43    PCO2 (VENOUS) 55 (H) 41 - 51 mm/Hg    PO2 (VENOUS) 89 35 - 50 mm/Hg    BICARBONATE (VENOUS) 31.2 (H) 22.0 - 29.0 mmol/L    BASE EXCESS 8.1 (H) 0.0 - 3.0 mmol/L    HEMOGLOBIN 9.5 (L) 12.0 - 18.0 g/dL    HEMATOCRITRT 29 (L) 37 - 50 %    OXYHEMOGLOBIN 92.4 40.0 -  80.0 %    CARBOXYHEMOGLOBIN 3.8 (H) <=3.0 %    MET-HEMOGLOBIN 0.9 <=1.5 %    O2CT 12.5 %    O2 SATURATION (VENOUS) 96.8 40.0 - 85.0 %    SODIUM 137 136 - 145 mmol/L    HEMOLYSIS None None, Mild    WHOLE BLOOD POTASSIUM 4.3 3.5 - 5.1 mmol/L    CHLORIDE 101 98 - 107 mmol/L    IONIZED CALCIUM  1.05 (L) 1.15 - 1.33 mmol/L    GLUCOSE 168 (H) 65 - 125 mg/dL    LACTATE 1.2 <=8.0 mmol/L   BASIC METABOLIC PANEL, NON-FASTING   Result Value Ref Range    SODIUM 142 136 - 145 mmol/L    POTASSIUM 4.3 3.5 - 5.1 mmol/L    CHLORIDE 106 98 - 107 mmol/L    CO2 TOTAL 30 21 - 31 mmol/L    ANION GAP 6 4 - 13 mmol/L    CALCIUM  8.5 (L) 8.6 - 10.3 mg/dL    GLUCOSE 856 (H) 74 - 109 mg/dL    BUN 17 7 - 25 mg/dL    CREATININE 9.19 9.39 - 1.30 mg/dL    BUN/CREA RATIO 21 6 - 22    ESTIMATED GFR 77 >59 mL/min/1.39m2    OSMOLALITY, CALCULATED 287 270 - 290 mOsm/kg   CBC WITH DIFF   Result Value Ref Range    WBC 4.9 3.8 - 11.8 x103/uL    RBC 3.27 (L) 3.63 - 4.92 x106/uL    HGB 8.2 (L) 10.9 - 14.3 g/dL    HCT 74.2 (L) 68.7 - 41.9 %    MCV 78.4 75.5 - 95.3 fL    MCH 25.0 24.7 - 32.8 pg    MCHC 31.8 (L) 32.3 - 35.6 g/dL    RDW 81.6 (H) 87.6 - 17.7 %    PLATELETS 114 (L) 140 - 440 x103/uL    MPV 10.1  7.9 - 10.8 fL    NEUTROPHIL % 89 (H) 43 - 77 %    LYMPHOCYTE % 9 (L) 16 - 46 %    MONOCYTE % 2 (L) 4 - 11 %    EOSINOPHIL % 0 (L) 1 - 7 %    BASOPHIL % 0 0 - 1 %    NEUTROPHIL # 4.40 1.90 - 8.20 x103/uL    LYMPHOCYTE # 0.40 (L) 1.10 - 3.10 x103/uL    MONOCYTE # 0.10 (L) 0.20 - 0.90 x103/uL    EOSINOPHIL # 0.00 0.00 - 0.50 x103/uL    BASOPHIL # 0.00 0.00 - 0.10 x103/uL   C-REACTIVE PROTEIN (CRP)   Result Value Ref Range    C-REACTIVE PROTEIN (CRP) <0.5 0.1 - 0.5 mg/dL   OCCULT BLOOD (PRN ED USE ONLY)    Specimen: Stool   Result Value Ref Range    OCCULT BLOOD Negative Negative     Micro Results:   Hospital Encounter on 06/21/24 (from the past 24 hours)   OCCULT BLOOD (PRN ED USE ONLY)    Collection Time: 06/22/24  7:28 AM    Specimen: Stool    Culture Result Status    OCCULT BLOOD Negative Final     Images:   No results found.   XR CHEST PA AND LATERAL   Final Result   NO CHANGE FROM THE PREVIOUS STUDY.            Radiologist location ID: [id number]         CT ABDOMEN PELVIS W IV CONTRAST   Final Result   NO ACUTE FINDINGS AT THE ABDOMEN OR PELVIS ON CONTRAST-ENHANCED CT.         Radiologist location ID: [id number]         XR CHEST PA AND LATERAL   Final Result   Bibasilar atelectasis or pneumonia         Radiologist location ID: [id number]              Problem List:  Active Hospital Problems   (*Primary Problem)    Diagnosis    *Pneumonia    Anemia, iron  deficiency    COPD (chronic obstructive pulmonary disease)       Nutrition:    DIET REGULAR Do you want to initiate MNT Protocol? Yes    Additional clinical characteristics related to nutrition:    - monitor for weight changes   - monitor intake and output    - monitor bowel functions                   Assessment/ Plan:   Suspected PNA   Acute on chronic hypoxic respiratory failure  -CXR showing bibasilar atelectasis vs PNA   -titrate O2 to maintain O2 sat 89-92%  -neb treatments as needed   -incentive spirometry   -continue unasyn  and doxycycline    -mucinex            Melena   Chronic iron  deficiency anemia   -stool for occult blood   -monitor Hgb   -iron  studies reviewed   -Venofer  x 3 doses   -GI has deferred EGD until respiratory status improved      Depression   -restart home medications once verified      History of CHF though last two echos normal   -euvolemic at this time   -further treatment adjustments will be made based off clinical course.  See orders for further details.  -Hospitalist personally evaluated and examined the patient  in conjunction with the MLP and agree with the assessments, treatment plan and disposition of the patient as recorded by the [facility].       DVT/PE Prophylaxis: Lovenox      Daily Orders (From admission, onward)       Start     Ordered    06/21/24 1900  enoxaparin   PF (LOVENOX ) 40 mg/0.4 mL SubQ injection  (MEDIUM RISK VTE LEVEL)  EVERY 24 HOURS        Question:  Select Indication of Use  Answer:  VTE Prophylaxis    06/21/24 1838    06/21/24 1845  PT IS INTERMEDIATE RISK FOR VENOUS THROMBOEMBOLISM  (MEDIUM RISK VTE LEVEL)  CONTINUOUS        References:    VTE RISK ASSESSMENT TOOL    06/21/24 1838                  Anticoagulants (last 24 hours)       Date/Time Action Medication Dose    06/21/24 2125 Given    enoxaparin  PF (LOVENOX ) 40 mg/0.4 mL SubQ injection 40 mg             Ky Rumple D Anias Bartol, PA-C

## 2024-06-22 NOTE — ED Nurses Note (Addendum)
 Charted in Error

## 2024-06-22 NOTE — ED Nurses Note (Signed)
Report called to 3W, spoke with Whitney,RN

## 2024-06-23 LAB — COMPREHENSIVE METABOLIC PANEL, NON-FASTING
ALBUMIN/GLOBULIN RATIO: 1.8 — ABNORMAL HIGH (ref 0.8–1.4)
ALBUMIN: 3.5 g/dL (ref 3.5–5.7)
ALKALINE PHOSPHATASE: 45 U/L (ref 34–104)
ALT (SGPT): 5 U/L — ABNORMAL LOW (ref 7–52)
ANION GAP: 3 mmol/L — ABNORMAL LOW (ref 4–13)
AST (SGOT): 13 U/L (ref 13–39)
BILIRUBIN TOTAL: 0.5 mg/dL (ref 0.3–1.0)
BUN/CREA RATIO: 27 — ABNORMAL HIGH (ref 6–22)
BUN: 21 mg/dL (ref 7–25)
CALCIUM, CORRECTED: 9.2 mg/dL (ref 8.9–10.8)
CALCIUM: 8.8 mg/dL (ref 8.6–10.3)
CHLORIDE: 108 mmol/L — ABNORMAL HIGH (ref 98–107)
CO2 TOTAL: 33 mmol/L — ABNORMAL HIGH (ref 21–31)
CREATININE: 0.78 mg/dL (ref 0.60–1.30)
ESTIMATED GFR: 79 mL/min/1.73mˆ2 (ref >59)
GLOBULIN: 1.9 — ABNORMAL LOW (ref 2.0–3.5)
GLUCOSE: 89 mg/dL (ref 74–109)
OSMOLALITY, CALCULATED: 289 mosm/kg (ref 270–290)
POTASSIUM: 3.9 mmol/L (ref 3.5–5.1)
PROTEIN TOTAL: 5.4 g/dL — ABNORMAL LOW (ref 6.4–8.9)
SODIUM: 144 mmol/L (ref 136–145)

## 2024-06-23 LAB — CBC
HCT: 25.2 % — ABNORMAL LOW (ref 31.2–41.9)
HGB: 7.8 g/dL — ABNORMAL LOW (ref 10.9–14.3)
MCH: 25.1 pg (ref 24.7–32.8)
MCHC: 31.2 g/dL — ABNORMAL LOW (ref 32.3–35.6)
MCV: 80.5 fL (ref 75.5–95.3)
MPV: 10.6 fL (ref 7.9–10.8)
PLATELETS: 125 x10ˆ3/uL — ABNORMAL LOW (ref 140–440)
RBC: 3.12 x10ˆ6/uL — ABNORMAL LOW (ref 3.63–4.92)
RDW: 18.7 % — ABNORMAL HIGH (ref 12.3–17.7)
WBC: 7 x10ˆ3/uL (ref 3.8–11.8)

## 2024-06-23 LAB — MAGNESIUM: MAGNESIUM: 2.1 mg/dL (ref 1.9–2.7)

## 2024-06-23 MED ORDER — ACETAMINOPHEN 325 MG TABLET
650.0000 mg | ORAL_TABLET | Freq: Four times a day (QID) | ORAL | Status: DC | PRN
  Administered 2024-06-23: 650 mg via ORAL
  Filled 2024-06-23: qty 2

## 2024-06-23 MED ORDER — ROPINIROLE 0.5 MG TABLET
0.2500 mg | ORAL_TABLET | Freq: Every evening | ORAL | Status: DC
  Administered 2024-06-23 – 2024-06-28 (×6): 0.25 mg via ORAL
  Filled 2024-06-23 (×6): qty 1

## 2024-06-23 NOTE — Consults (Signed)
 [facility]  Gastroenterology/ Hepatology Consult Note      Patient: Misty Johnson, Misty Johnson, 73 y.o. female  Date of Birth: 1950/10/12  Admission Date: 06/21/2024  PCP: Manus Endo, MD    History of Present Illness:  Resting in bed. Family at bedside. She states breathing seems to be improved from yesterday. She normally has some shortness of breath and requires oxygen  at home.  Denies abdominal pain, nausea or vomiting. Has not had a BM today.     Review of Systems   Constitutional: Negative for fever.     As per HPI    Historical Data   Medications Prior to Admission       Prescriptions    albuterol  sulfate (PROVENTIL ) 2.5 mg /3 mL (0.083 %) Inhalation nebulizer solution    Take 3 mL (2.5 mg total) by nebulization Once per day as needed for Wheezing    Patient not taking:  Reported on 06/22/2024    atorvastatin  (LIPITOR) 40 mg Oral Tablet    Take 1 Tablet (40 mg total) by mouth Every evening    ergocalciferol, vitamin D2, (DRISDOL) 1,250 mcg (50,000 unit) Oral Capsule    Take 1 Capsule (50,000 Units total) by mouth Every 7 days Saturday    escitalopram  oxalate (LEXAPRO ) 20 mg Oral Tablet    Take 1 Tablet (20 mg total) by mouth Daily    fluticasone -umeclidin-vilanter (TRELEGY ELLIPTA) 100-62.5-25 mcg Inhalation Disk with Device    Take 1 Inhalation by inhalation Daily    furosemide  (LASIX ) 40 mg Oral Tablet    Take 1 Tablet (40 mg total) by mouth Daily    ipratropium-albuterol  0.5 mg-3 mg(2.5 mg base)/3 mL Solution for Nebulization    Take 3 mL by nebulization Four times a day    Levocetirizine (XYZAL) 5 mg Oral Tablet    Take 1 Tablet (5 mg total) by mouth Every evening    metoprolol  succinate (TOPROL -XL) 25 mg Oral Tablet Sustained Release 24 hr    Take 1 Tablet (25 mg total) by mouth Daily    omeprazole (PRILOSEC) 20 mg Oral Capsule, Delayed Release(E.C.)    Take 1 Capsule (20 mg total) by mouth Daily    ondansetron  (ZOFRAN  ODT) 4 mg Oral Tablet, Rapid Dissolve    Take 1 Tablet (4 mg total) by  mouth Every 8 hours as needed for Nausea/Vomiting    Patient not taking:  Reported on 06/22/2024    OXYGEN -AIR DELIVERY SYSTEMS DEACT    3L NC at home    polyethylene glycol (MIRALAX ) 17 gram Oral Powder in Packet    Take 2 Packets (34 g total) by mouth Twice daily    Patient not taking:  Reported on 06/22/2024    potassium chloride  (K-DUR) 20 mEq Oral Tab Sust.Rel. Particle/Crystal    Take 1 Tablet (20 mEq total) by mouth Daily    revefenacin  (YUPELRI ) 175 mcg/3 mL Inhalation nebulizer solution    Take 3 mL (175 mcg total) by nebulization Daily          Allergies[1]       Vitals:  Temperature: 36.7 C (98.1 F)  Heart Rate: 68  Respiratory Rate: 18  BP (Non-Invasive): (!) 140/50  SpO2: 90 %    Objective:  General Appearance:  Comfortable, well-appearing and in no acute distress.    Vital signs: (most recent): Blood pressure (!) 140/50, pulse 68, temperature 36.7 C (98.1 F), resp. rate 18, height 1.626 m (5' 4), weight 74.5 kg (164 lb 3 oz), SpO2 90%.  Vital signs are normal.  No fever.    Lungs:  Normal effort and normal respiratory rate.  There are decreased breath sounds and wheezes.    Heart: Normal rate.  Regular rhythm.  S1 normal and S2 normal.    Abdomen: Abdomen is soft.  Bowel sounds are normal.   There is no abdominal tenderness.     Extremities: Normal range of motion.    Pulses: Distal pulses are intact.    Neurological: Patient is alert and oriented to person, place and time.    Skin:  Warm and dry.        Active Hospital Problems   (*Primary Problem)    Diagnosis    *Pneumonia    Anemia, iron  deficiency    COPD (chronic obstructive pulmonary disease)       Labs:     Results for orders placed or performed during the hospital encounter of 06/21/24 (from the past 24 hours)   CBC   Result Value Ref Range    WBC 7.0 3.8 - 11.8 x103/uL    RBC 3.12 (L) 3.63 - 4.92 x106/uL    HGB 7.8 (L) 10.9 - 14.3 g/dL    HCT 74.7 (L) 68.7 - 41.9 %    MCV 80.5 75.5 - 95.3 fL    MCH 25.1 24.7 - 32.8 pg    MCHC 31.2 (L)  32.3 - 35.6 g/dL    RDW 81.2 (H) 87.6 - 17.7 %    PLATELETS 125 (L) 140 - 440 x103/uL    MPV 10.6 7.9 - 10.8 fL   COMPREHENSIVE METABOLIC PANEL, NON-FASTING   Result Value Ref Range    SODIUM 144 136 - 145 mmol/L    POTASSIUM 3.9 3.5 - 5.1 mmol/L    CHLORIDE 108 (H) 98 - 107 mmol/L    CO2 TOTAL 33 (H) 21 - 31 mmol/L    ANION GAP 3 (L) 4 - 13 mmol/L    BUN 21 7 - 25 mg/dL    CREATININE 9.21 9.39 - 1.30 mg/dL    BUN/CREA RATIO 27 (H) 6 - 22    ESTIMATED GFR 79 >59 mL/min/1.33m2    ALBUMIN 3.5 3.5 - 5.7 g/dL    CALCIUM  8.8 8.6 - 10.3 mg/dL    GLUCOSE 89 74 - 890 mg/dL    ALKALINE PHOSPHATASE 45 34 - 104 U/L    ALT (SGPT) 5 (L) 7 - 52 U/L    AST (SGOT) 13 13 - 39 U/L    BILIRUBIN TOTAL 0.5 0.3 - 1.0 mg/dL    PROTEIN TOTAL 5.4 (L) 6.4 - 8.9 g/dL    ALBUMIN/GLOBULIN RATIO 1.8 (H) 0.8 - 1.4    OSMOLALITY, CALCULATED 289 270 - 290 mOsm/kg    CALCIUM , CORRECTED 9.2 8.9 - 10.8 mg/dL    GLOBULIN 1.9 (L) 2.0 - 3.5   MAGNESIUM    Result Value Ref Range    MAGNESIUM  2.1 1.9 - 2.7 mg/dL       Imaging Studies:    Results for orders placed or performed during the hospital encounter of 06/21/24   XR CHEST PA AND LATERAL     Status: None    Narrative    Mykala Farro    RADIOLOGIST: Jacob Sechrist    XR CHEST PA AND LATERAL performed on 06/21/2024 5:49 PM    CLINICAL HISTORY: shortness of breath  sob    TECHNIQUE: Frontal and lateral views of the chest.    COMPARISON:  Chest radiograph dated 02/14/2024  FINDINGS:    Heart size is mildly enlarged.    The mediastinal contour is unremarkable.  Bibasilar opacities have developed.   T8 compression deformity is unchanged with associated exaggerated thoracic kyphosis        Impression    Bibasilar atelectasis or pneumonia      Radiologist location ID: [id number]     CT ABDOMEN PELVIS W IV CONTRAST     Status: None    Narrative    Peony Casselman    RADIOLOGIST: Lang Speaks    CT ABDOMEN PELVIS W IV CONTRAST performed on 06/21/2024 7:47 PM    CLINICAL HISTORY: melena, hx of cancerous  polyps  melena, hx of cancerous polyps, blood in stool surgical hx of appendectomy    TECHNIQUE:  Abdomen and pelvis CT with intravenous contrast.  IV CONTRAST: 75 ml's of Omnipaque  350    COMPARISON:    CT abdomen and pelvis dated 03/09/2023    FINDINGS:  Lung bases: Mild dependent atelectasis    Liver:   Unremarkable.    Gallbladder:   Unremarkable.    Spleen:   Unremarkable.    Pancreas:   Unremarkable.    Adrenals:   Unremarkable.    Kidneys:     Left renal midpole cyst measures 3.2 cm. Normal right kidney.    Bladder:  Unremarkable.    Uterus and Adnexa:  Unremarkable.    Bowel:   Unremarkable.    Appendix:  The appendix is surgically absent.    Lymph nodes:  No suspicious lymph node enlargement.    Vasculature:   Extensive calcified atherosclerosis infrarenal abdominal aorta.     Peritoneum / Retroperitoneum: No ascites.  No free air.    Bones:   Unremarkable.        Impression    NO ACUTE FINDINGS AT THE ABDOMEN OR PELVIS ON CONTRAST-ENHANCED CT.      Radiologist location ID: [id number]     XR CHEST PA AND LATERAL     Status: None    Narrative    Samiha Shanafelt    RADIOLOGIST: Rodgers Norleen Langdon, MD    XR CHEST PA AND LATERAL performed on 06/22/2024 10:07 AM    CLINICAL HISTORY: Hypoxia/Resp Failure  Hypoxia/Resp Failure    TECHNIQUE: Frontal and lateral views of the chest.    COMPARISON:  06/21/2024    FINDINGS:    Heart and mediastinum are stable.    Lungs are stable redemonstrating bibasilar opacities that developed on the previous exam. .    T8 compression fracture similar to previous imaging.      Impression    NO CHANGE FROM THE PREVIOUS STUDY.        Radiologist location ID: [id number]               Assessment/Plan:    Anemia  Pneumonia     Continue current management and supportive care. Continue to monitor H&H and for signs GI bleeding. Hgb 7.8 today. Transfuse as needed to maintain Hgb at safe level. Iron  saturation is 5. Chest xray showed pneumonia and antibiotics have been started. CT abdomen  unremarkable. Per medical respiratory status is back to her baseline. Will plan for EGD tomorrow. NPO after midnight. Will continue to follow. Further recommendations based on clinical course. Patient discussed in detail with Dr MARLA Blanch and treatment plan decided by him.     Suzen Jenkins Signs, APRN, CNP          [1]   Allergies  Allergen Reactions  Hydrocodone  Nausea/ Vomiting

## 2024-06-23 NOTE — Respiratory Therapy (Deleted)
 06/23/24 1642   RT Oxygen  Therapy   Start Time 1647   Orders Updated in EMR Yes   Oxygen  Check/Change Changed To   $ O2 Delivery OM   Flow (L/min) (Oxygen  Therapy) 4   SpO2 94 %   Stop Time 1651   Duration 4 Minutes   $Pulse Ox Frequency Oximetery Single Determination     Found on nc 3l/m spo2 82%. Placed on om 4l/m 94%

## 2024-06-23 NOTE — Respiratory Therapy (Signed)
 06/23/24 1642        MetaNeb/Volara   Start Time 1642   Treatment Status Given   $MetaNeb Charge Capture (Resp Only) Neb (Sub)   Route mouth piece   Medication DuoNeb   CHFO/CPEP CHFO/CPEP   Pressure (cm H2O) 20 cm H2O   Patient Tolerance good   End Time 1654     Stopped at 8 mins. Patient sp02 low.

## 2024-06-23 NOTE — Respiratory Therapy (Signed)
 06/23/24 1642   RT Oxygen  Therapy   Start Time 1647   Orders Updated in EMR Yes   Oxygen  Check/Change Changed To   $ O2 Delivery OM   Flow (L/min) (Oxygen  Therapy) 3   SpO2 99 %   Stop Time 1651   Duration 4 Minutes   $Pulse Ox Frequency Oximetery Single Determination     Found on nc 3l/m nc. 82% sat. Placed back on om 3l/m , sp02 increased to 99%

## 2024-06-23 NOTE — Progress Notes (Signed)
 [facility]               IP PROGRESS NOTE      Enma, Maeda  Date of Admission:  06/21/2024  Date of Birth:  07-24-1950  Date of Service:  06/23/2024    Hospital Day:  LOS: 2 days     Subjective:   Ilia Dimaano is a 73 y/o female who is being seen in f/u for suspected PNA, acute on chronic hypoxic respiratory failure, melena.  Patient is on 4L OM.  Denies worsening SOB .   Saturating 96%  O2 turned down to 3L.   GI has evaluated and deferred EGD until respiratory status improves.   Lost IV site.   CCU nurses in room to assess for new site.  Venofer  ordered.  Hgb 8.2 this am  Denies any bloody stools this am.     06/23/2024 Patient seen and examined at bedside with hospitalist.  Breathing back at baseline.   Hgb 7.8 this am .  Receiving venofer .   Spoke with Luke and Dr MARLA Blanch will scope in am       Vital Signs:  Temp (24hrs) Max:36.7 C (98.1 F)      Temperature: 36.7 C (98.1 F)  BP (Non-Invasive): (!) 140/50  MAP (Non-Invasive): 74 mmHG  Heart Rate: 68  Respiratory Rate: 18  SpO2: 90 %    Current Medications:  acetaminophen  (TYLENOL ) tablet, 650 mg, Oral, Q6H PRN  ampicillin -sulbactam (UNASYN ) 3 g in NS 100 mL IVPB with adaptor, 3 g, Intravenous, Q6H  atorvastatin  (LIPITOR) tablet, 40 mg, Oral, QPM  NS 250 mL flush bag, , Intravenous, Q15 Min PRN   And  D5W 250 mL flush bag, , Intravenous, Q15 Min PRN  NS 250 mL flush bag, , Intravenous, Q15 Min PRN   And  D5W 250 mL flush bag, , Intravenous, Q15 Min PRN  NS 250 mL flush bag, , Intravenous, Q15 Min PRN   And  D5W 250 mL flush bag, , Intravenous, Q15 Min PRN  NS 250 mL flush bag, , Intravenous, Q15 Min PRN   And  D5W 250 mL flush bag, , Intravenous, Q15 Min PRN  doxycycline  tablet, 100 mg, Oral, 2x/day  enoxaparin  PF (LOVENOX ) 40 mg/0.4 mL SubQ injection, 40 mg, Subcutaneous, Q24H  [Held by provider] furosemide  (LASIX ) tablet, 40 mg, Oral, Daily  guaiFENesin  (MUCINEX ) extended release tablet - for cough (expectorant), 600 mg, Oral,  2x/day  ipratropium-albuterol  0.5 mg-3 mg(2.5 mg base)/3 mL Solution for Nebulization, 3 mL, Nebulization, Q4H PRN  ipratropium-albuterol  0.5 mg-3 mg(2.5 mg base)/3 mL Solution for Nebulization, 3 mL, Nebulization, 4x/day  metoprolol  succinate (TOPROL -XL) 24 hr extended release tablet, 25 mg, Oral, Daily  NS flush syringe, 3 mL, Intracatheter, Q8HRS  pantoprazole  (PROTONIX ) delayed release tablet, 40 mg, Oral, Daily  ramelteon  (ROZEREM ) tablet, 8 mg, Oral, NIGHTLY  rOPINIRole  (REQUIP ) tablet, 0.25 mg, Oral, QPM  sennosides-docusate sodium  (SENOKOT-S) 8.6-50mg  per tablet, 1 Tablet, Oral, 2x/day PRN        Current Orders:  Active Orders   Lab    CBC     Frequency: ONE TIME     Number of Occurrences: 1 Occurrences    CBC     Frequency: ONE TIME     Number of Occurrences: 1 Occurrences    COMPREHENSIVE METABOLIC PANEL, NON-FASTING     Frequency: ONE TIME     Number of Occurrences: 1 Occurrences    COMPREHENSIVE METABOLIC PANEL, NON-FASTING     Frequency:  ONE TIME     Number of Occurrences: 1 Occurrences    MAGNESIUM      Frequency: ONE TIME     Number of Occurrences: 1 Occurrences    MAGNESIUM      Frequency: ONE TIME     Number of Occurrences: 1 Occurrences    URINALYSIS, MACROSCOPIC     Frequency: Once     Number of Occurrences: 1 Occurrences    URINALYSIS, MACROSCOPIC AND MICROSCOPIC W/CULTURE REFLEX     Frequency: ONE TIME     Number of Occurrences: 1 Occurrences    URINALYSIS, MICROSCOPIC     Frequency: Once     Number of Occurrences: 1 Occurrences   Diet    DIET REGULAR Do you want to initiate MNT Protocol? Yes     Frequency: All Meals     Number of Occurrences: 1 Occurrences   Nursing    ACTIVITY     Frequency: UNTIL DISCONTINUED     Number of Occurrences: Until Specified    BLADDER SCAN IF NO URINE IN 8 HR PERIOD     Frequency: Q6H PRN     Number of Occurrences: Until Specified    COORDINATE LABS/BLOOD DRAWS WITH PHLEBOTOMY TO PREVENT INTERRUPTIONS TO LIMITED ACTIVITY TIME     Linked Order: And     Frequency:  UNTIL DISCONTINUED     Number of Occurrences: Until Specified    ENSURE SENSORY AIDS IN PLACE (GLASSES, HEARING AIDS, DENTURES) TEMPERATURE AND BEDDING ADJUSTMENTS     Frequency: UNTIL DISCONTINUED     Number of Occurrences: Until Specified    In the first hour after the full bolus is completed, recheck BP every 15 min until normotension achieved (SBP > 90 or MAP > 65). Notify Provider for vasopressor order if normotension not achieved within the hour.     Frequency: ONE TIME     Number of Occurrences: 1 Occurrences    In the first hour after the full bolus is completed, recheck BP every 15 min until normotension achieved (SBP > 90 or MAP > 65). Notify Provider for vasopressor order if normotension not achieved within the hour.     Frequency: ONE TIME     Number of Occurrences: 1 Occurrences    INCENTIVE SPIROMETRY NURSING     Frequency: Q1H WA     Number of Occurrences: 250 Occurrences    INTAKE AND OUTPUT QSHIFT     Frequency: QSHIFT     Number of Occurrences: Until Specified    INTAKE AND OUTPUT QSHIFT     Frequency: QSHIFT     Number of Occurrences: Until Specified    INTAKE AND OUTPUT QSHIFT     Frequency: QSHIFT     Number of Occurrences: Until Specified    IV fluids already ordered/administered OR patient has contraindication to initial sepsis IV fluid bolus - document reason in order questions     Frequency: CONTINUOUS     Number of Occurrences: Until Specified    IV fluids already ordered/administered OR patient has contraindication to initial sepsis IV fluid bolus - document reason in order questions     Frequency: CONTINUOUS     Number of Occurrences: Until Specified    MD TO NURSE:  IF YOU ANTICIPATE DIFFICULTY OR ARE UNBLE TO OBTAIN CULTURES WITHIN 30 MIN OF THIS ORDER, PLEASE CONTACT THE ORDERING PROVIDER FOR FURTHER DIRECTION.  TIMELY ANTIBIOTIC ADMINISTRATION IS IMPERATIVE TO TREATING A PATIENT WITH SEPSIS.     Frequency: UNTIL DISCONTINUED  Number of Occurrences: Until Specified    MD TO NURSE:   IF YOU ANTICIPATE DIFFICULTY OR ARE UNBLE TO OBTAIN CULTURES WITHIN 30 MIN OF THIS ORDER, PLEASE CONTACT THE ORDERING PROVIDER FOR FURTHER DIRECTION.  TIMELY ANTIBIOTIC ADMINISTRATION IS IMPERATIVE TO TREATING A PATIENT WITH SEPSIS.     Frequency: UNTIL DISCONTINUED     Number of Occurrences: Until Specified    MD TO NURSE: IF UNABLE TO READILY OBTAIN ADEQUATE IV ACCESS WITHIN 15 MINUTES OF THIS ORDER, PLEASE NOTIFY PROVIDER IMMEDIATELY FOR FURTHER DIRECTION.     Frequency: UNTIL DISCONTINUED     Number of Occurrences: Until Specified    MD TO NURSE: IF UNABLE TO READILY OBTAIN ADEQUATE IV ACCESS WITHIN 15 MINUTES OF THIS ORDER, PLEASE NOTIFY PROVIDER IMMEDIATELY FOR FURTHER DIRECTION.     Frequency: UNTIL DISCONTINUED     Number of Occurrences: Until Specified    MINIMAL PATIENT INTERRUPTIONS BETWEEN 11PM and 6AM     Linked Order: And     Frequency: UNTIL DISCONTINUED     Number of Occurrences: Until Specified     Order Comments: This includes, but is not limited to, the following: vital signs, labs, and I&O documentation.      MISCELLANEOUS MD/DO TO NURSE     Frequency: UNTIL DISCONTINUED     Number of Occurrences: Until Specified     Order Comments: Please wean FIO2.  Goal 89-82%      Notify MD Vital Signs     Frequency: PRN     Number of Occurrences: Until Specified    PLEASE ALLOW FAMILY PRESENCE, PHOTOS OR PERSONALIZED ITEMS FROM HOME IF AVAILABLE     Frequency: UNTIL DISCONTINUED     Number of Occurrences: Until Specified    PLEASE OBTAIN VITAL SIGNS BEFORE 11PM     Linked Order: And     Frequency: UNTIL DISCONTINUED     Number of Occurrences: Until Specified    PT IS INTERMEDIATE RISK FOR VENOUS THROMBOEMBOLISM     Frequency: CONTINUOUS     Number of Occurrences: Until Specified    PULSE OXIMETRY CONTINUOUS     Frequency: CONTINUOUS     Number of Occurrences: Until Specified    PULSE OXIMETRY CONTINUOUS     Frequency: CONTINUOUS     Number of Occurrences: Until Specified    PULSE OXIMETRY Q4H     Frequency:  Q4H     Number of Occurrences: Until Specified    Small Volume Medication Infusion Line Flush     Linked Order: And     Frequency: UNTIL DISCONTINUED     Number of Occurrences: Until Specified     Order Comments: All intermittent infusions/IVPBs will be run as a secondary infusion and must be flushed to ensure the patient receives the entire dose. Administer and document on the corresponding primary flush order that matches the base solution of the intermittent infusion/IVPB. An iso-osmotic solution is compatible and may be flushed with D5W or NS. Page the service pharmacist with any questions.      Small Volume Medication Infusion Line Flush     Linked Order: And     Frequency: UNTIL DISCONTINUED     Number of Occurrences: Until Specified     Order Comments: All intermittent infusions/IVPBs will be run as a secondary infusion and must be flushed to ensure the patient receives the entire dose. Administer and document on the corresponding primary flush order that matches the base solution of the intermittent infusion/IVPB. An iso-osmotic solution is  compatible and may be flushed with D5W or NS. Page the service pharmacist with any questions.      Small Volume Medication Infusion Line Flush     Linked Order: And     Frequency: UNTIL DISCONTINUED     Number of Occurrences: Until Specified     Order Comments: All intermittent infusions/IVPBs will be run as a secondary infusion and must be flushed to ensure the patient receives the entire dose. Administer and document on the corresponding primary flush order that matches the base solution of the intermittent infusion/IVPB. An iso-osmotic solution is compatible and may be flushed with D5W or NS. Page the service pharmacist with any questions.      Small Volume Medication Infusion Line Flush     Linked Order: And     Frequency: UNTIL DISCONTINUED     Number of Occurrences: Until Specified     Order Comments: All intermittent infusions/IVPBs will be run as a secondary  infusion and must be flushed to ensure the patient receives the entire dose. Administer and document on the corresponding primary flush order that matches the base solution of the intermittent infusion/IVPB. An iso-osmotic solution is compatible and may be flushed with D5W or NS. Page the service pharmacist with any questions.      TELEMETRY MONITORING     Frequency: CONTINUOUS     Number of Occurrences: Until Specified    VISUAL CHECKS BY RN Q2 HOURS UNLESS INITIAL ASSESSMENT HAS NOT BEEN COMPLETED     Linked Order: And     Frequency: UNTIL DISCONTINUED     Number of Occurrences: Until Specified    VITAL SIGNS  Q2H     Frequency: Q2H     Number of Occurrences: 250 Occurrences    VITAL SIGNS  Q2H     Frequency: Q2H     Number of Occurrences: 250 Occurrences    VITAL SIGNS  Q4H     Frequency: Q4H     Number of Occurrences: Until Specified    WEIGH PATIENT     Frequency: UPON ADMISSION     Number of Occurrences: 1 Occurrences    WEIGH PATIENT     Frequency: UPON ADMISSION     Number of Occurrences: 1 Occurrences   Consult    IP CONSULT TO GASTROENTEROLOGY Requested Provider; TOBIE HITT     Frequency: ONE TIME     Number of Occurrences: 1 Occurrences   OT    OT EVAL & TREAT     Frequency: Per Therapist Discretion     Number of Occurrences: 1 Occurrences     Scheduling Instructions:               PT    PT EVALUATE AND TREAT     Frequency: Per Therapist Discretion     Number of Occurrences: 1 Occurrences     Order Comments: If patient's O2 level drops, PT may increase O2 until sats are > 93%.       Scheduling Instructions:               Respiratory Care    AIRWAY CLEARANCE - METANEB/VOLARA THERAPY QID     Frequency: QID     Number of Occurrences: Until Specified    OXYGEN  - NASAL CANNULA     Frequency: CONTINUOUS     Number of Occurrences: Until Specified     Order Comments: Flowrate should not exeed 6L/min except [facility] facility.  RESPIRATORY CARE EVALUATION     Frequency: ONE TIME     Number of Occurrences:  1 Occurrences     Order Comments: Specify Order Details:     IV    INSERT & MAINTAIN PERIPHERAL IV ACCESS     Frequency: UNTIL DISCONTINUED     Number of Occurrences: Until Specified    INSERT & MAINTAIN PERIPHERAL IV ACCESS     Frequency: UNTIL DISCONTINUED     Number of Occurrences: Until Specified    INSERT & MAINTAIN PERIPHERAL IV ACCESS     Frequency: UNTIL DISCONTINUED     Number of Occurrences: Until Specified    INSERT & MAINTAIN PERIPHERAL IV ACCESS     Frequency: UNTIL DISCONTINUED     Number of Occurrences: Until Specified    PERIPHERAL IV DRESSING CHANGE     Frequency: PRN     Number of Occurrences: Until Specified    PERIPHERAL IV DRESSING CHANGE     Frequency: PRN     Number of Occurrences: Until Specified    PERIPHERAL IV DRESSING CHANGE     Frequency: PRN     Number of Occurrences: Until Specified    PERIPHERAL IV DRESSING CHANGE     Frequency: PRN     Number of Occurrences: Until Specified   Medications    acetaminophen  (TYLENOL ) tablet     Frequency: Q6H PRN     Dose: 650 mg     Route: Oral    ampicillin -sulbactam (UNASYN ) 3 g in NS 100 mL IVPB with adaptor     Frequency: Q6H     Dose: 3 g     Route: Intravenous    atorvastatin  (LIPITOR) tablet     Frequency: QPM     Dose: 40 mg     Route: Oral    D5W 250 mL flush bag     Linked Order: And     Frequency: Q15 Min PRN     Route: Intravenous    D5W 250 mL flush bag     Linked Order: And     Frequency: Q15 Min PRN     Route: Intravenous    D5W 250 mL flush bag     Linked Order: And     Frequency: Q15 Min PRN     Route: Intravenous    D5W 250 mL flush bag     Linked Order: And     Frequency: Q15 Min PRN     Route: Intravenous    doxycycline  tablet     Frequency: 2x/day     Dose: 100 mg     Route: Oral    enoxaparin  PF (LOVENOX ) 40 mg/0.4 mL SubQ injection     Frequency: Q24H     Dose: 40 mg     Route: Subcutaneous    furosemide  (LASIX ) tablet     Frequency: Daily     Dose: 40 mg     Route: Oral    guaiFENesin  (MUCINEX ) extended release tablet - for  cough (expectorant)     Frequency: 2x/day     Dose: 600 mg     Route: Oral    ipratropium-albuterol  0.5 mg-3 mg(2.5 mg base)/3 mL Solution for Nebulization     Frequency: Q4H PRN     Dose: 3 mL     Route: Nebulization    ipratropium-albuterol  0.5 mg-3 mg(2.5 mg base)/3 mL Solution for Nebulization     Frequency: 4x/day     Dose: 3 mL     Route:  Nebulization    metoprolol  succinate (TOPROL -XL) 24 hr extended release tablet     Frequency: Daily     Dose: 25 mg     Route: Oral    NS 250 mL flush bag     Linked Order: And     Frequency: Q15 Min PRN     Route: Intravenous    NS 250 mL flush bag     Linked Order: And     Frequency: Q15 Min PRN     Route: Intravenous    NS 250 mL flush bag     Linked Order: And     Frequency: Q15 Min PRN     Route: Intravenous    NS 250 mL flush bag     Linked Order: And     Frequency: Q15 Min PRN     Route: Intravenous    NS flush syringe     Frequency: Q8HRS     Dose: 3 mL     Route: Intracatheter    pantoprazole  (PROTONIX ) delayed release tablet     Frequency: Daily     Dose: 40 mg     Route: Oral    ramelteon  (ROZEREM ) tablet     Frequency: NIGHTLY     Dose: 8 mg     Route: Oral    rOPINIRole  (REQUIP ) tablet     Frequency: QPM     Dose: 0.25 mg     Route: Oral    sennosides-docusate sodium  (SENOKOT-S) 8.6-50mg  per tablet     Frequency: 2x/day PRN     Dose: 1 Tablet     Route: Oral        Review of Systems:  Focused review of system was completed. Refer to the HPI for ROS details.     Today's Physical Exam:  Physical Exam  Constitutional:       General: She is not in acute distress.     Appearance: She is not ill-appearing.   HENT:      Head: Normocephalic and atraumatic.   Cardiovascular:      Rate and Rhythm: Normal rate and regular rhythm.   Pulmonary:      Effort: Pulmonary effort is normal. No respiratory distress.   Abdominal:      General: Bowel sounds are normal.      Palpations: Abdomen is soft.   Skin:     General: Skin is warm.   Neurological:      General: No focal deficit  present.      Mental Status: She is alert.   Psychiatric:         Mood and Affect: Mood normal.        I/O:  I/O last 24 hours:    Intake/Output Summary (Last 24 hours) at 06/23/2024 1326  Last data filed at 06/23/2024 1235  Gross per 24 hour   Intake 1061 ml   Output 300 ml   Net 761 ml     I/O current shift:  12/21 0700 - 12/21 1859  In: 200   Out: -   Labs:  Reviewed: I have reviewed all lab results.  Lab Results Today:    Results for orders placed or performed during the hospital encounter of 06/21/24 (from the past 24 hours)   CBC   Result Value Ref Range    WBC 7.0 3.8 - 11.8 x103/uL    RBC 3.12 (L) 3.63 - 4.92 x106/uL    HGB 7.8 (L) 10.9 - 14.3 g/dL  HCT 25.2 (L) 31.2 - 41.9 %    MCV 80.5 75.5 - 95.3 fL    MCH 25.1 24.7 - 32.8 pg    MCHC 31.2 (L) 32.3 - 35.6 g/dL    RDW 81.2 (H) 87.6 - 17.7 %    PLATELETS 125 (L) 140 - 440 x103/uL    MPV 10.6 7.9 - 10.8 fL   COMPREHENSIVE METABOLIC PANEL, NON-FASTING   Result Value Ref Range    SODIUM 144 136 - 145 mmol/L    POTASSIUM 3.9 3.5 - 5.1 mmol/L    CHLORIDE 108 (H) 98 - 107 mmol/L    CO2 TOTAL 33 (H) 21 - 31 mmol/L    ANION GAP 3 (L) 4 - 13 mmol/L    BUN 21 7 - 25 mg/dL    CREATININE 9.21 9.39 - 1.30 mg/dL    BUN/CREA RATIO 27 (H) 6 - 22    ESTIMATED GFR 79 >59 mL/min/1.66m2    ALBUMIN 3.5 3.5 - 5.7 g/dL    CALCIUM  8.8 8.6 - 10.3 mg/dL    GLUCOSE 89 74 - 890 mg/dL    ALKALINE PHOSPHATASE 45 34 - 104 U/L    ALT (SGPT) 5 (L) 7 - 52 U/L    AST (SGOT) 13 13 - 39 U/L    BILIRUBIN TOTAL 0.5 0.3 - 1.0 mg/dL    PROTEIN TOTAL 5.4 (L) 6.4 - 8.9 g/dL    ALBUMIN/GLOBULIN RATIO 1.8 (H) 0.8 - 1.4    OSMOLALITY, CALCULATED 289 270 - 290 mOsm/kg    CALCIUM , CORRECTED 9.2 8.9 - 10.8 mg/dL    GLOBULIN 1.9 (L) 2.0 - 3.5   MAGNESIUM    Result Value Ref Range    MAGNESIUM  2.1 1.9 - 2.7 mg/dL     Micro Results:   No results found for any visits on 06/21/24 (from the past 24 hours).    Images:   XR CHEST PA AND LATERAL  Result Date: 06/22/2024  Impression NO CHANGE FROM THE PREVIOUS  STUDY. Radiologist location ID: [id number]     CT ABDOMEN PELVIS W IV CONTRAST  Result Date: 06/21/2024  Impression NO ACUTE FINDINGS AT THE ABDOMEN OR PELVIS ON CONTRAST-ENHANCED CT. Radiologist location ID: [id number]     XR CHEST PA AND LATERAL  Result Date: 06/21/2024  Impression Bibasilar atelectasis or pneumonia Radiologist location ID: [id number]      XR CHEST PA AND LATERAL   Final Result   NO CHANGE FROM THE PREVIOUS STUDY.            Radiologist location ID: [id number]         CT ABDOMEN PELVIS W IV CONTRAST   Final Result   NO ACUTE FINDINGS AT THE ABDOMEN OR PELVIS ON CONTRAST-ENHANCED CT.         Radiologist location ID: [id number]         XR CHEST PA AND LATERAL   Final Result   Bibasilar atelectasis or pneumonia         Radiologist location ID: [id number]              Problem List:  Active Hospital Problems   (*Primary Problem)    Diagnosis    *Pneumonia    Anemia, iron  deficiency    COPD (chronic obstructive pulmonary disease)       Nutrition:    DIET REGULAR Do you want to initiate MNT Protocol? Yes    Additional clinical characteristics related to nutrition:    - monitor for weight changes   -  monitor intake and output    - monitor bowel functions                   Assessment/ Plan:   Suspected PNA   Acute on chronic hypoxic respiratory failure  -CXR showing bibasilar atelectasis vs PNA   -titrate O2 to maintain O2 sat 89-92%  -neb treatments as needed   -incentive spirometry   -continue unasyn  and doxycycline    -mucinex    -at baseline 3L           Melena   Chronic iron  deficiency anemia   -stool for occult blood +  -monitor Hgb   -iron  studies reviewed   -Venofer  x 3 doses   -GI following: Hgb 7.8 , Dr Tobie will do EGD in am , NPO after midnight        Depression   -restart home medications once verified      History of CHF though last two echos normal   -euvolemic at this time     -further treatment adjustments will be made based off clinical course.  See orders for further  details.  -Hospitalist personally evaluated and examined the patient in conjunction with the MLP and agree with the assessments, treatment plan and disposition of the patient as recorded by the [facility].       DVT/PE Prophylaxis: Lovenox      Daily Orders (From admission, onward)       Start     Ordered    06/21/24 1900  enoxaparin  PF (LOVENOX ) 40 mg/0.4 mL SubQ injection  (MEDIUM RISK VTE LEVEL)  EVERY 24 HOURS        Question:  Select Indication of Use  Answer:  VTE Prophylaxis    06/21/24 1838    06/21/24 1845  PT IS INTERMEDIATE RISK FOR VENOUS THROMBOEMBOLISM  (MEDIUM RISK VTE LEVEL)  CONTINUOUS        References:    VTE RISK ASSESSMENT TOOL    06/21/24 1838                  Anticoagulants (last 24 hours)       Date/Time Action Medication Dose    06/22/24 1802 Given    enoxaparin  PF (LOVENOX ) 40 mg/0.4 mL SubQ injection 40 mg             Suhaas Agena D Daylani Deblois, PA-C

## 2024-06-24 ENCOUNTER — Encounter (HOSPITAL_COMMUNITY): Admission: EM | Disposition: A | Discharge status: Home / Self Care | Payer: Self-pay | Source: Home / Self Care

## 2024-06-24 ENCOUNTER — Inpatient Hospital Stay (HOSPITAL_COMMUNITY): Payer: Medicare (Managed Care)

## 2024-06-24 LAB — MAGNESIUM: MAGNESIUM: 1.9 mg/dL (ref 1.9–2.7)

## 2024-06-24 LAB — CBC
HCT: 26.8 % — ABNORMAL LOW (ref 31.2–41.9)
HGB: 8.4 g/dL — ABNORMAL LOW (ref 10.9–14.3)
MCH: 25.2 pg (ref 24.7–32.8)
MCHC: 31.1 g/dL — ABNORMAL LOW (ref 32.3–35.6)
MCV: 81.1 fL (ref 75.5–95.3)
MPV: 10.1 fL (ref 7.9–10.8)
PLATELETS: 114 x10ˆ3/uL — ABNORMAL LOW (ref 140–440)
RBC: 3.31 x10ˆ6/uL — ABNORMAL LOW (ref 3.63–4.92)
RDW: 18.8 % — ABNORMAL HIGH (ref 12.3–17.7)
WBC: 6 x10ˆ3/uL (ref 3.8–11.8)

## 2024-06-24 LAB — COMPREHENSIVE METABOLIC PANEL, NON-FASTING
ALBUMIN/GLOBULIN RATIO: 1.8 — ABNORMAL HIGH (ref 0.8–1.4)
ALBUMIN: 3.5 g/dL (ref 3.5–5.7)
ALKALINE PHOSPHATASE: 47 U/L (ref 34–104)
ALT (SGPT): 6 U/L — ABNORMAL LOW (ref 7–52)
ANION GAP: 4 mmol/L (ref 4–13)
AST (SGOT): 16 U/L (ref 13–39)
BILIRUBIN TOTAL: 0.6 mg/dL (ref 0.3–1.0)
BUN/CREA RATIO: 30 — ABNORMAL HIGH (ref 6–22)
BUN: 18 mg/dL (ref 7–25)
CALCIUM, CORRECTED: 9 mg/dL (ref 8.9–10.8)
CALCIUM: 8.6 mg/dL (ref 8.6–10.3)
CHLORIDE: 108 mmol/L — ABNORMAL HIGH (ref 98–107)
CO2 TOTAL: 31 mmol/L (ref 21–31)
CREATININE: 0.61 mg/dL (ref 0.60–1.30)
ESTIMATED GFR: 93 mL/min/1.73mˆ2 (ref >59)
GLOBULIN: 2 (ref 2.0–3.5)
GLUCOSE: 99 mg/dL (ref 74–109)
OSMOLALITY, CALCULATED: 287 mosm/kg (ref 270–290)
POTASSIUM: 4.1 mmol/L (ref 3.5–5.1)
PROTEIN TOTAL: 5.5 g/dL — ABNORMAL LOW (ref 6.4–8.9)
SODIUM: 143 mmol/L (ref 136–145)

## 2024-06-24 LAB — COVID-19, FLU A/B, RSV RAPID BY PCR
INFLUENZA VIRUS TYPE A: NOT DETECTED
INFLUENZA VIRUS TYPE B: NOT DETECTED
RESPIRATORY SYNCTIAL VIRUS (RSV): NOT DETECTED
SARS-CoV-2: NOT DETECTED

## 2024-06-24 MED ORDER — FENTANYL (PF) 50 MCG/ML INJECTION SOLUTION
INTRAMUSCULAR | Status: AC
  Filled 2024-06-24: qty 2

## 2024-06-24 MED ORDER — FENTANYL (PF) 50 MCG/ML INJECTION WRAPPER
INJECTION | Freq: Once | INTRAMUSCULAR | Status: DC | PRN
  Administered 2024-06-24: 25 ug via INTRAVENOUS

## 2024-06-24 MED ORDER — METHYLPREDNISOLONE SOD SUCC 125 MG SOLUTION FOR INJECTION WRAPPER
60.0000 mg | INTRAVENOUS | Status: AC
  Administered 2024-06-24: 60 mg via INTRAVENOUS
  Filled 2024-06-24: qty 2

## 2024-06-24 MED ORDER — ARFORMOTEROL 15 MCG/2 ML SOLUTION FOR NEBULIZATION
15.0000 ug | INHALATION_SOLUTION | Freq: Two times a day (BID) | RESPIRATORY_TRACT | Status: DC
  Administered 2024-06-24 – 2024-06-25 (×3): 15 ug via RESPIRATORY_TRACT
  Filled 2024-06-24: qty 1

## 2024-06-24 MED ORDER — REVEFENACIN 175 MCG/3 ML SOLUTION FOR NEBULIZATION
175.0000 ug | INHALATION_SOLUTION | Freq: Every day | RESPIRATORY_TRACT | Status: DC
  Administered 2024-06-24 – 2024-06-25 (×2): 175 ug via RESPIRATORY_TRACT

## 2024-06-24 MED ORDER — LIDOCAINE (PF) 100 MG/5 ML (2 %) INTRAVENOUS SYRINGE
INJECTION | Freq: Once | INTRAVENOUS | Status: DC | PRN
  Administered 2024-06-24: 40 mg via INTRAVENOUS

## 2024-06-24 MED ORDER — FLUTICASONE FUR. 100 MCG-UMECLID 62.5 MCG-VILANT 25 MCG INHALAT.POWDER: 1.0000 | DISK | Freq: Every day | RESPIRATORY_TRACT | Status: DC

## 2024-06-24 MED ORDER — ONDANSETRON HCL (PF) 4 MG/2 ML INJECTION SOLUTION
Freq: Once | INTRAMUSCULAR | Status: DC | PRN
  Administered 2024-06-24: 4 mg via INTRAVENOUS

## 2024-06-24 MED ORDER — REVEFENACIN 175 MCG/3 ML SOLUTION FOR NEBULIZATION: 175.0000 ug | INHALATION_SOLUTION | Freq: Every day | RESPIRATORY_TRACT | Status: DC

## 2024-06-24 MED ORDER — BUDESONIDE 0.5 MG/2 ML SUSPENSION FOR NEBULIZATION
0.5000 mg | INHALATION_SUSPENSION | Freq: Two times a day (BID) | RESPIRATORY_TRACT | Status: DC
  Administered 2024-06-24 – 2024-06-25 (×3): 0.5 mg via RESPIRATORY_TRACT
  Filled 2024-06-24: qty 2

## 2024-06-24 MED ORDER — PROPOFOL 10 MG/ML IV BOLUS
INJECTION | Freq: Once | INTRAVENOUS | Status: DC | PRN
  Administered 2024-06-24: 50 mg via INTRAVENOUS

## 2024-06-24 MED ORDER — ESCITALOPRAM 20 MG TABLET
20.0000 mg | ORAL_TABLET | Freq: Every day | ORAL | Status: DC
  Administered 2024-06-24: 0 mg via ORAL
  Administered 2024-06-25 – 2024-06-29 (×5): 20 mg via ORAL
  Filled 2024-06-24 (×5): qty 1

## 2024-06-24 MED ORDER — ALBUTEROL SULFATE HFA 90 MCG/ACTUATION AEROSOL INHALER
INHALATION_SPRAY | Freq: Once | RESPIRATORY_TRACT | Status: DC | PRN
  Administered 2024-06-24: 2 via RESPIRATORY_TRACT

## 2024-06-24 MED ORDER — LACTATED RINGERS INTRAVENOUS SOLUTION: INTRAVENOUS | Status: DC | PRN

## 2024-06-24 NOTE — PT Evaluation (Signed)
 [facility]  [address]  [place], [address]  [phone]  (Fax) [phone]  Rehabilitation Services  Physical Therapy Inpatient Initial Evaluation    Patient Name: Misty Johnson  Date of Birth: 19-Mar-1951  Height: Height: 162.6 cm (5' 4)  Weight: Weight: 74.5 kg (164 lb 3 oz)  Room/Bed: 313/B  Payor: [employer] MEDICARE / Plan: [employer] MEDICARE / Product Type: MEDICARE [facility] /       PMH:  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 She participated well with PT evaluation despite being tired and feeling fatigued. She completed bed mobility with MinA for scooting and supine to/from EOB, MinA for sit to stand transfer, stand to sit with CGA and ambulated 2 feet forward and 2 feet backward at EOB x2 with FWW and Min/CGA. She had O2 desaturation and fatigued easily. She will benefit from continued PT services to address deficits and aid a safe discharge to home with family assistance. Discharge recommendation is for IPR or SNF stay for continued rehab services and to promote activity tolerance.    Total Distance Ambulated: (P) 8 feet(2 feet forward and 2 feet backward, x2)  Independence: (P) minimum assist (75% patient effort), contact guard assist  Assistive Device: (P) walker, front wheeled      Discharge Needs:    Equipment Recommendation: (P) front wheeled walker      The patient presents with mobility limitations due to impaired balance, impaired strength, and impaired  functional activity tolerance that significantly impair/prevent patients ability to participate in mobility-related activities of daily living (MRADLs) including  ambulation and transfers in order to safely complete, toileting, bathing, safely entering/exiting the home. This functional mobility deficit can be improved with the use of a (P) front wheeled walker  in order to decrease the risk of falls in performance of these MRADLs.  Patient is able to safely use this assistive device.    Discharge Disposition: (P) TBD, inpatient rehabilitation facility, skilled nursing facility, home with assist, home with home health    JUSTIFICATION OF DISCHARGE RECOMMENDATION   Based on current diagnosis, functional performance prior to admission, and current functional performance, this patient requires continued PT services in (P) TBD, inpatient rehabilitation facility, skilled nursing facility, home with assist, home with home health in order to achieve significant functional improvements in these deficit areas: (P) aerobic capacity/endurance, gait, locomotion, and balance, muscle performance, ventilation and respiration/gas exchange.        Plan:   Current Intervention: (P) balance training, bed mobility training, gait training, home exercise program, patient/family education, strengthening, transfer training  To provide physical therapy services (P) other (see comments) (1-2x/day at least one day weekly)  for duration of (P) until discharge.    The risks/benefits of therapy have been discussed with the patient/caregiver and he/she is  in agreement with the established plan of care.       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 She was admitted with pneumonia, melena, anemia and acute on chronic respiratory failure. Order received for PT evaluation.   Medical Lines PIV Line;Telemetry   Respiratory Status face mask   Existing Precautions/Restrictions oxygen  therapy device and L/min;fall precautions   Mutuality/Individual Preferences   Anxieties, Fears or Concerns feels very tired, breathing   Individualized Care Needs Physical Therapy   Living Environment   Lives With child(ren), adult  (son, has assistance from her daughter also wo comes into her home to help her)   Living Arrangements house  (one story with basement but she does not access)   Home Accessibility stairs to enter home   Home Main Entrance   Number of Stairs, Main Entrance five   Stair Railings, Main Entrance railings on both sides of stairs   Functional Level Prior   Ambulation 0 - independent   Transferring 0 - independent   Toileting 0 - independent   Bathing 3 - assistive equipment and person   Dressing 0 - independent   Eating 0 - independent   Communication 0 - understands/communicates without difficulty   Prior Functional Level Comment Patient has a SPC and FWW available but has not been using them. Patient had limitations in function at home due to O2 desaturation, SOB and fatigue but is worse than her baseline.   Pre Treatment Status   Pre Treatment Patient Status Patient supine in bed;Call light within reach;Patient safety alarm activated   Support Present Pre Treatment  None   Communication Pre Treatment  Charge Nurse    Communication Pre Treatment Comment cleared for PT evaluation   Cognitive Assessment/Interventions   Behavior/Mood Observations alert;cooperative   Orientation Status oriented x 4   Attention WNL/WFL   Follows Commands WFL   Pre- Treatment Vital Signs   Pre-Treatment Heart Rate (beats/min) 77   Pre SpO2 (%) 99   O2 Delivery Pre Treatment supplemental O2   Vitals Comment O2 via face mask at 3 L/min which is what she uses at home   Pre-Treatment Pain   Pretreatment Pain Rating 0/10 - no pain   RUE Assessment   RUE Assessment WFL- Within Functional Limits   LUE Assessment   LUE Assessment WFL- Within Functional Limits   RLE Assessment   RLE Assessment X-Exceptions   RLE ROM   (WFL AROM)   RLE Strength grossly  4-/5 to 4/5   LLE Assessment   LLE Assessment X-Exceptions   LLE ROM   (WFL AROM)   LLE Strength grossly 4-/5 to 4/5   Trunk Assessment   Trunk Assessment WFL-Within Functional Limits   Mobility Assessment/Training   Additional Documentation Bed Mobility Assessment/Treatment (Group);Transfer Assessment/Treatment (Group);Gait Assessment/Treatment (Group)   Bed Mobility   Scoot/Bridge Independence minimum assist (75% patient effort)   Supine-Sit Independence minimum assist (75% patient effort)   Sit to Supine, Independence minimum assist (75% patient effort)   Bed Mobility, Assistive Device bed rails;draw sheet   Transfer Assessment/Treatment   Sit-Stand Independence minimum assist (75% patient effort)   Stand-Sit Independence contact guard assist   Transfer Comment Stood at EOB x1 minute with FWW and CGA, able to take steps at bedside, became fatigued and had to sit back down.   Gait Assessment/Treatment   Total Distance Ambulated 8   Independence  minimum assist (75% patient effort);contact guard assist   Assistive Device  walker, front wheeled   Distance in Feet 2 feet forwrd and 2 feet backward x2 with a standing rest between.   Comment She was fatiged following and requested to sit back down onto bed. Had O2  desaturation to as low as 84% but fluctuated between 87-90% most of the time she was active.   Motor Skills/Interventions   Additional Documentation Licensed Conveyancer (Group)   Balance   Sitting Balance: Static fair + balance   Sitting Balance: Dynamic fair - balance   Sit-to-Stand Balance poor + balance   Standing Balance: Static fair - balance   Standing Balance: Dynamic poor + balance   Post Treatment Status   Post Treatment Patient Status Patient supine in bed;Call light within reach;Patient safety alarm activated   Support Present Post Treatment  Family present  (daughter had stopped to visit)   Film/video Editor Nurse   Communication Post Treatment Comment updated on status of eval   Patient Effort good   Post-Treatment Vital Signs   Post-treatment Heart Rate (beats/min) 83   Post SpO2 (%) 94   O2 Delivery Post Treatment supplemental O2   Post-Treatment Pain   Posttreatment Pain Rating 0/10 - no pain   Physical Therapy Clinical Impression   Assessment Patient was admitted with pneumonia, melena, anemia and acute on chronic respiratory failure. She participated well with PT evaluation despite being tired and feeling fatigued. She completed bed mobility with MinA for scooting and supine to/from EOB, MinA for sit to stand transfer, stand to sit with CGA and ambulated 2 feet forward and 2 feet backward at EOB x2 with FWW and Min/CGA. She had O2 desaturation and fatigued easily. She will benefit from continued PT services to address deficits and aid a safe discharge to home with family assistance. Discharge recommendation is for IPR or SNF stay for continued rehab services and to promote activity tolerance.   Criteria for Skilled Therapeutic meets criteria   Impairments Found (describe specific impairments) aerobic capacity/endurance;gait, locomotion, and balance;muscle performance;ventilation and respiration/gas exchange   Functional Limitations in Following  self-care;home  management;community/leisure   Rehab Potential good  (for set goals)   Therapy Frequency other (see comments)  (1-2x/day at least one day weekly)   Predicted Duration of Therapy Intervention (days/wks) until discharge   Anticipated Equipment Needs at Discharge (PT) front wheeled walker   Anticipated Discharge Disposition TBD;inpatient rehabilitation facility;skilled nursing facility;home with assist;home with home health   Evaluation Complexity Justification   Patient History: Co-morbidity/factors that impact Plan  of Care 3 or more that impact Plan of Care   Examination Components 4 or more Exam elements addressed   Presentation Evolving: Symptoms, complaints, characteristics of condition changing &/or cognitive deficits present   Clinical Decision Making Moderate complexity   Evaluation Complexity Moderate complexity   Care Plan Goals   PT Rehab Goals Bed Mobility Goal;Transfer Training Goal;Gait Training Goal;Strength Goal   Planned Therapy Interventions, PT Eval   Planned Therapy Interventions (PT) balance training;bed mobility training;gait training;home exercise program;patient/family education;strengthening;transfer training   Transfer Training Goal   Transfer Training Goal, Date Established 06/24/24   Transfer Training Goal, Time to Achieve by discharge   Transfer Training Goal, Activity Type sit-to-stand/stand-to-sit;bed-to-chair/chair-to-bed;toilet   Transfer Training Goal, Current Status stand-by assistance   Transfer Training Goal, Independence Level minimum assist (75% patient effort)   Transfer Training Goal, Assist Device walker, rolling;least restrictive assistive device   Gait Training  Goal, Distance to Achieve   Gait Training  Goal, Date Established 06/24/24   Gait Training  Goal, Time to Achieve by discharge   Gait Training  Goal, Current Status minimum assist (75% patient effort)   Gait Training  Goal, Independence Level stand-by assistance   Gait Training  Goal, Assist Device walker,  rolling;least restricted assistive device   Gait Training  Goal, Distance to Achieve 100 feet   Gait Training  Goal, Additional Goal portable O2 and chair to follow as needed   Bed Mobility Goal   Bed Mobility Goal, Date Established 06/24/24   Bed Mobility Goal, Time to Achieve by discharge   Bed Mobility Goal, Activity Type all bed mobility activities   Bed Mobility Goal, Current Status minimum assist (75% patient effort)   Bed Mobility Goal, Independence Level stand-by assistance   Bed Mobility Goal, Assistive Device bed rails   Strength Goal   Strength Goal, Date Established 06/24/24   Strength Goal, Time to Achieve by discharge   Strength Goal, Measure to Achieve Patient will achieve strength BLE to 4+/5 to aid transfers and gait.   Physical Therapy Time and Intention   Total PT Minutes: 25         INTERVENTION MINUTES: EVALUATION 15 minutes and THERAPEUTIC ACTIVITY 10 minutes    EVALUATION COMPLEXITY : CLINICAL DECISION MAKING OF MODERATE COMPLEXITY AS INDICATED BY PMHX, PHYSICAL THERAPY ASSESSMENT OF MUSCULOSKELETAL AND NEUROLOGICAL SYSTEMS AND ACTIVITY LIMITATIONS. CLINICAL PRESENTATION HAS CHANGING CHARACTERISTICS.    Therapist:     Landry Rase, PT  06/24/2024, 17:48

## 2024-06-24 NOTE — Anesthesia Transfer of Care (Signed)
 ANESTHESIA TRANSFER OF CARE   Misty Johnson is a 73 y.o. 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     Enlarged heart     LVH (left ventricular hypertrophy)     Major depression       Allergy History as of 06/24/24       HYDROCODONE          Noted Status Severity Type Reaction    12/17/21 1234 Milta Service, RN 12/17/21 Active Low  Nausea/ Vomiting                  I completed my transfer of care / handoff to the receiving personnel during which we discussed:  Access, Airway, All key/critical aspects of case discussed, Analgesia, Antibiotics, Expectation of post procedure, Fluids/Product, Gave opportunity for questions and acknowledgement of understanding, Labs and PMHx                                                                   Last OR Temp: Temperature: 37.3 C (99.1 F)      Airway:* No LDAs found *  Blood pressure (!) 137/51, pulse 85, temperature 37.3 C (99.1 F), resp. rate 20, height 1.626 m (5' 4), weight 74.5 kg (164 lb 3 oz), SpO2 98%.

## 2024-06-24 NOTE — OR PostOp (Signed)
 Report called to floor nurse, patient being transferred by Ellouise Learn to 313B.

## 2024-06-24 NOTE — OR Surgeon (Signed)
 [facility]      EGD NOTE               Patient Name: Misty Johnson  Age:  73 y.o.  Sex:  female  MRN:  [id number]  CSN:  [id number]  Date of Birth: 07/03/1951    Medications Given: Per Nurse Anesthetist    Equipment Used: Olympus [id number]    Date: 06/24/2024     Indications:  Anemia, suspected GI blood loss    The scope was passed into the esophagus without difficulty under direct visualization. There was moderate erythema at the distal esophagus. No esophageal varices. A small hiatal hernia was seen. The mucosa in the cardia appeared normal. The diaphragmatic impingement on retroflexion view appeared normal. The Z line appeared slightly irregular.      The mucosa in the fundus was normal. The mucosa in the body and antrum had mild erythema. The mucosa in the duodenal bulb was normal. The mucosa past the duodenal bulb to the 2nd portion of the duodenum appeared normal. The ampulla was not visualized.   Biopsies were not taken. Photograph(s) Taken: Yes; Blood loss: none     Impression:  Moderate distal esophagitis, LA grade A  Small hiatal hernia  Mild gastritis   No ulcers or bleeding seen     Recommendations:  Continue Pantoprazole  40 mg po q day, before dinner (Increase Omeprazole to 40 mg po q day at home)  Maintain antireflux precautions  Elective colonoscopy as outpatient after pneumonia has resolved      Reginald Blanch, MD

## 2024-06-24 NOTE — Progress Notes (Signed)
 [place] MEDICINE [facility]    HOSPITALIST PROGRESS NOTE    Holli Blumenthal  Date of service: 06/24/2024  Date of Admission:  06/21/2024  Hospital Day:  LOS: 3 days     Interval History:   Patient is seen in follow-up on 06/24/2024: She has been kept NPO for EGD today.  Denies any abdominal pain currently.  Hemoglobin at 8.4.      Filed Vitals:    06/24/24 0330 06/24/24 0402 06/24/24 0900 06/24/24 1017   BP:  (!) 128/40 (!) 119/58    Pulse:  73 72    Resp:  18     Temp:  36.6 C (97.9 F) 36.8 C (98.2 F)    SpO2: 94% 95%  96%       Intake/Output Summary (Last 24 hours) at 06/24/2024 1045  Last data filed at 06/24/2024 0707  Gross per 24 hour   Intake 817 ml   Output 300 ml   Net 517 ml       Start Time: 0842  $ O2 Delivery: OxyMask  SpO2: 96 %         Scheduled Medication PRN Medications     ampicillin -sulbactam (UNASYN ) 3 g in NS 100 mL IVPB with adaptor, 3 g, Q6H    atorvastatin  (LIPITOR) tablet, 40 mg, QPM    doxycycline  tablet, 100 mg, 2x/day    enoxaparin  PF (LOVENOX ) 40 mg/0.4 mL SubQ injection, 40 mg, Q24H    [Held by provider] furosemide  (LASIX ) tablet, 40 mg, Daily    guaiFENesin  (MUCINEX ) extended release tablet - for cough (expectorant), 600 mg, 2x/day    ipratropium-albuterol  0.5 mg-3 mg(2.5 mg base)/3 mL Solution for Nebulization, 3 mL, 4x/day    metoprolol  succinate (TOPROL -XL) 24 hr extended release tablet, 25 mg, Daily    NS flush syringe, 3 mL, Q8HRS    pantoprazole  (PROTONIX ) delayed release tablet, 40 mg, Daily    ramelteon  (ROZEREM ) tablet, 8 mg, NIGHTLY    rOPINIRole  (REQUIP ) tablet, 0.25 mg, QPM    acetaminophen  (TYLENOL ) tablet, 650 mg, Q6H PRN    NS 250 mL flush bag, , Q15 Min PRN **AND** D5W 250 mL flush bag, , Q15 Min PRN **AND** Small Volume Medication Infusion Line Flush, , UNTIL DISCONTINUED    NS 250 mL flush bag, , Q15 Min PRN **AND** D5W 250 mL flush bag, , Q15 Min PRN **AND** Small Volume Medication Infusion Line Flush, , UNTIL DISCONTINUED    NS 250 mL flush bag, ,  Q15 Min PRN **AND** D5W 250 mL flush bag, , Q15 Min PRN **AND** Small Volume Medication Infusion Line Flush, , UNTIL DISCONTINUED    NS 250 mL flush bag, , Q15 Min PRN **AND** D5W 250 mL flush bag, , Q15 Min PRN **AND** Small Volume Medication Infusion Line Flush, , UNTIL DISCONTINUED    ipratropium-albuterol  0.5 mg-3 mg(2.5 mg base)/3 mL Solution for Nebulization, 3 mL, Q4H PRN    sennosides-docusate sodium  (SENOKOT-S) 8.6-50mg  per tablet, 1 Tablet, 2x/day PRN      Insulin Sliding Scale Requirement:  Antihyperglycemics (last 24 hours)       None             Labs:  CBC Results CMP RESULTS   Recent Labs     06/21/24  1616 06/22/24  0515 06/23/24  0453 06/24/24  0356   WBC 5.5 4.9 7.0 6.0   RBC 3.65 3.27* 3.12* 3.31*   HGB 9.1* 8.2* 7.8* 8.4*   HCT 28.7* 25.7* 25.2* 26.8*  MCV 78.7 78.4 80.5 81.1   MCH 25.0 25.0 25.1 25.2   MCHC 31.8* 31.8* 31.2* 31.1*   RDW 18.6* 18.3* 18.7* 18.8*   PLTCNT 145 114* 125* 114*   MPV 10.1 10.1 10.6 10.1     Recent Labs     06/21/24  1616 06/21/24  1618 06/21/24  1922 06/22/24  0515 06/23/24  0453 06/24/24  0356   SODIUM 141 138 137 142 144 143   POTASSIUM 4.4  --   --  4.3 3.9 4.1   CHLORIDE 101 101 101 106 108* 108*   CO2 33*  --   --  30 33* 31   ANIONGAP 7  --   --  6 3* 4   BUN 17  --   --  17 21 18    CREATININE 0.83  --   --  0.80 0.78 0.61   BUNCRRATIO 20  --   --  21 27* 30*   GFR 73  --   --  77 79 93   ALBUMIN 4.0  --   --   --  3.5 3.5   CALCIUM  8.8  --   --  8.5* 8.8 8.6   GLUCOSENF 94  --   --  143* 89 99   ALKPHOS 56  --   --   --  45 47   ALT 7  --   --   --  5* 6*   AST 17  --   --   --  13 16   TOTBILIRUBIN 0.7  --   --   --  0.5 0.6   TOTALPROTEIN 6.4  --   --   --  5.4* 5.5*   ALBGLOB 1.7*  --   --   --  1.8* 1.8*   OSMBLD 283  --   --  287 289 287   CORCA 8.8*  --   --   --  9.2 9.0   GLOBULIN 2.4  --   --   --  1.9* 2.0      OTHER CHEMISTRIES RESULTS CREATININE CLEARENCE   Recent Labs     06/21/24  1616 06/23/24  0453 06/24/24  0356   MAGNESIUM  2.0 2.1 1.9    Renal  Creatinine (72h ago through now)       Date/Time Serum Creatinine Range CrCl    06/24/24 0356 0.61 mg/dL   9.39 - 8.69 mg/dL 21.2 mL/min    87/78/74 0453 0.78 mg/dL   9.39 - 8.69 mg/dL 38.3 mL/min    87/79/74 0515 0.8 mg/dL   9.39 - 8.69 mg/dL 60 mL/min    87/80/74 8383 0.83 mg/dL   9.39 - 8.69 mg/dL 42.0 mL/min            COAG RESULTS CARDIAC MARKERS   Recent Labs     06/21/24  1616   APTT 30.7   PROTHROMTME 12.3   INR 1.09     Recent Labs     06/21/24  1616 06/21/24  1702 06/21/24  1922   TROPONINI 12  13 12 11    BNP 700*  --   --    LACTICACID 0.9  --   --    LIPASE 65  --   --         HA1C TSH LIPID PANEL   No results found for: HA1C   No results found for: TSH   No results found for: CHOLESTEROL, TRIG, HDLCHOL, LDLCHOL, VLDLCAL, CHOLHDLRATIO  Assessment & Plan:    Suspected PNA   Acute on chronic hypoxic respiratory failure  -CXR showing bibasilar atelectasis vs PNA   -titrate O2 to maintain O2 sat 89-92%  -neb treatments as needed   -incentive spirometry   -continue unasyn  and doxycycline    -mucinex    -at baseline 3L            Melena   Chronic iron  deficiency anemia   -stool for occult blood +  -monitor Hgb   -iron  studies reviewed   -Venofer  x 3 doses   - GI following, patient scheduled for EGD today.  Kept NPO currently.        Depression   - Resume Lexapro      History of CHF though last two echos normal   -euvolemic at this time    Disposition Planning:  To be determined      IVF:   Current Facility-Administered Medications   Medication Dose Frequency Last Rate     GI Prophylaxis : Pantoprazole   Bowel Regimen:  Senokot  Date of Last Bowel Movement: 06/22/24  DVT/PE Prophylaxis :    Daily Orders (From admission, onward)       Start     Ordered    06/21/24 1900  enoxaparin  PF (LOVENOX ) 40 mg/0.4 mL SubQ injection  (MEDIUM RISK VTE LEVEL)  EVERY 24 HOURS        Question:  Select Indication of Use  Answer:  VTE Prophylaxis    06/21/24 1838    06/21/24 1845  PT IS INTERMEDIATE RISK FOR  VENOUS THROMBOEMBOLISM  (MEDIUM RISK VTE LEVEL)  CONTINUOUS        References:    VTE RISK ASSESSMENT TOOL    06/21/24 1838                   Anticoagulants (last 24 hours)       Date/Time Action Medication Dose    06/23/24 1803 Given    enoxaparin  PF (LOVENOX ) 40 mg/0.4 mL SubQ injection 40 mg           Lines:   Patient Lines/Drains/Airways Status       Active Line / Dialysis Catheter / Dialysis Graft / Drain / Airway / Wound       Name Placement date Placement time Site Days    Peripheral IV Posterior;Right Wrist 06/22/24  1823  -- 1    External Urinary Catheter 06/23/24  1544  -- less than 1                   Diet: Nutrition:    DIET NPO - SPECIFIC DATE & TIME    Additional clinical characteristics related to nutrition:    - monitor for weight changes   - monitor intake and output    - monitor bowel functions                 On the day of the encounter, a total of 25 minutes was spent on this patient encounter including review of historical information, examination, documentation and post-visit activities. The time documented excludes procedural time and time spent on wellness portion of the visit.     Powell Corona, APRN, CNP  06/24/2024  [place] MEDICINE HOSPITALIST

## 2024-06-24 NOTE — Anesthesia Postprocedure Evaluation (Signed)
 Anesthesia Post Op Evaluation    Patient: Misty Johnson  Procedure(s):  EGD    Last Vitals:Temperature: 37.3 C (99.1 F) (06/24/24 1535)  Heart Rate: 85 (06/24/24 1535)  BP (Non-Invasive): (!) 137/51 (06/24/24 1535)  Respiratory Rate: 20 (06/24/24 1535)  SpO2: 98 % (06/24/24 1535)    No notable events documented.    Patient is sufficiently recovered from the effects of anesthesia to participate in the evaluation and has returned to their pre-procedure level.  Patient location during evaluation: PACU       Patient participation: complete - patient participated  Level of consciousness: awake and alert and responsive to verbal stimuli    Pain management: adequate  Airway patency: patent    Anesthetic complications: no  Cardiovascular status: acceptable  Respiratory status: acceptable  Hydration status: acceptable  Patient post-procedure temperature: Pt Normothermic   PONV Status: Absent

## 2024-06-24 NOTE — Respiratory Therapy (Signed)
 06/24/24 2036   Breath Sounds   L General Breath Sounds Diminished   R General Breath Sounds Diminished   RT Oxygen  Therapy   Start Time 2030   Orders Updated in EMR Yes   Oxygen  Check/Change Initial Set Up   $ O2 Delivery HFNC 6-15L   Proximal  Temperature 31 C (87.8 F)   Flow (L/min) (RT) 35   FiO2 (RT) 48 %   SpO2 95 %   Stop Time 2047   Duration 17 Minutes     Patient was place on heated HFNC, due to patient desaturation to 70's during volera breathing treatment with nasal canula on.

## 2024-06-24 NOTE — Anesthesia Preprocedure Evaluation (Addendum)
 ANESTHESIA PRE-OP EVALUATION  Planned Procedure: EGD  Review of Systems           nursing notes reviewed        Pulmonary   pneumonia, COPD, current smoker and Denies smoking in last 24  hours,   Cardiovascular    CAD, CHF and ECG reviewed ,No peripheral edema,  Exercise Tolerance: <4 METS        GI/Hepatic/Renal           Endo/Other    anemia,      Neuro/Psych/MS    depression     Cancer                        Physical Assessment      Airway       Mallampati: III    TM distance: <3 FB    Neck ROM: limited  Mouth Opening: fair.  No Facial hair  No Beard        Dental       Dentition intact             Pulmonary    Breath sounds clear to auscultation  (-) no rhonchi, no decreased breath sounds, no wheezes, no rales and no stridor     Cardiovascular    Rhythm: regular  Rate: Normal  (-) no friction rub, carotid bruit is not present, no peripheral edema and no murmur     Other findings              Plan  ASA 3     Planned anesthesia type: general     total intravenous anesthesia                    Intravenous induction     Anesthesia issues/risks discussed are: PONV, Sore Throat, Intraoperative Awareness/ Recall, Aspiration, Stroke and Cardiac Events/MI.  Anesthetic plan and risks discussed with patient  signed consent obtained      Use of blood products discussed with patient who consented to blood products.      Patient's NPO status is appropriate for Anesthesia.           Plan discussed with CRNA.

## 2024-06-24 NOTE — Respiratory Therapy (Signed)
 06/24/24 2012   Breath Sounds   L General Breath Sounds Diminished   R General Breath Sounds Diminished   Aerosol Therapy (SVN)   Start Time 2012   Treatment Status Given   Respiratory Treatment Status (SVN) given   Route (Aerosol Therapy) other (see comments)  (Volera)   $ Respiratory Treatment (Resp only) Neb (SX2)   Medications Brovana ;Pulmicort  (Budesonide )   Patient Position HOB elevated   Signs of Intolerance (SVN) oxygen  desaturation   Stop Time 2027   Duration (minutes) 15 minutes        MetaNeb/Volara   Start Time 2012   Treatment Status Given   $MetaNeb Charge Capture (Resp Only) Neb (Sub)   Route mouth piece   Medication Brovana ;Pulmicort  (Budesonide )   CHFO/CPEP CHFO/CPEP   Patient Tolerance other (comment)  (patient desat to 70's durring treatment.)   End Time 2027   Respiratory Pre/Post-Treatment Assess   Pre-Treatment Heart Rate (beats/min) 80   Pre-Treatment Resp Rate (breaths/min) 18   Post-treatment Heart Rate (beats/min) 68   Post-treatment Resp Rate (breaths/min) 20   Breath Sounds Post-Respiratory Treatment   Breath Sounds Posttreatment LUL no change   Breath Sounds Posttreatment RUL no change   Chest Physiotherapy (CPT)   Start Time 2012   Type (CPT) percussion   Method (CPT) mechanical percussor   Signs of Intolerance (CPT) oxygen  desaturation   $ CPT/PD Identifier (Resp only) S   Intermittent Positive Pressure Breathing (IPPB)   Start Time 2012   $IPPB Tx Charge (Resp only) IPPB (Sub)   Respiratory Treatment Status (IPPB) given   Route (IPPB) mouthpiece utilized   Patient Position HOB elevated   Stop Time 2027   Duration (minutes) 15 minutes     Patient desaturated to the 70's during treatment.

## 2024-06-24 NOTE — Nurses Notes (Signed)
 Bedside hand off given to: Mitzie Constant, RN  Vitals Signs as follows: Temp:98.55f                                        HR:99                                        Resp:20                                        BP:139/38                                        O2:91% 3 liters Oxymask  IV patent  All questions answered and vital signs obtained.

## 2024-06-24 NOTE — Respiratory Therapy (Signed)
 06/24/24 2036   Breath Sounds   L General Breath Sounds Diminished   R General Breath Sounds Diminished   RT Oxygen  Therapy   Start Time 2030   Orders Updated in EMR Yes   Oxygen  Check/Change Initial Set Up   $ O2 Delivery HFNC   Proximal  Temperature 31 C (87.8 F)   Flow (L/min) (RT) 35   FiO2 (RT) 48 %   SpO2 95 %   Stop Time 2047   Duration 17 Minutes   High Flow Nasal Cannula   Start Time 2030   Order Updated in EMR Yes   Equipment Adult   HFNC Device Hamilton C1   HFNC Status Placed On HFNC   Facial Skin Integrity WNL   H2O Bottle Check (HFNC) Initial   Circuit Initial   Temperature 37 C (98.6 F)   Flow  35 LPM   FiO2 48 %   SpO2 97   $HFNC Initial Charge (Resp only) HFNC-I   $Type of Circuit Changed (Resp only) IFlow/Nasal   Stop Time 2047   Duration 17 Minutes     Patient was placed on HFNC after desating to 68% on Volera breathing treatment.

## 2024-06-24 NOTE — Respiratory Therapy (Signed)
 Patient desaturated to 70'S during volera breathing treatment.

## 2024-06-25 ENCOUNTER — Inpatient Hospital Stay (HOSPITAL_COMMUNITY): Payer: Medicare (Managed Care)

## 2024-06-25 DIAGNOSIS — K2091 Esophagitis, unspecified with bleeding: Secondary | ICD-10-CM

## 2024-06-25 DIAGNOSIS — Z8709 Personal history of other diseases of the respiratory system: Principal | ICD-10-CM

## 2024-06-25 DIAGNOSIS — Z79899 Other long term (current) drug therapy: Secondary | ICD-10-CM

## 2024-06-25 DIAGNOSIS — K449 Diaphragmatic hernia without obstruction or gangrene: Secondary | ICD-10-CM

## 2024-06-25 DIAGNOSIS — K2970 Gastritis, unspecified, without bleeding: Secondary | ICD-10-CM

## 2024-06-25 LAB — CBC
HCT: 26.3 % — ABNORMAL LOW (ref 31.2–41.9)
HGB: 7.9 g/dL — ABNORMAL LOW (ref 10.9–14.3)
MCH: 25.1 pg (ref 24.7–32.8)
MCHC: 30.1 g/dL — ABNORMAL LOW (ref 32.3–35.6)
MCV: 83.4 fL (ref 75.5–95.3)
MPV: 10.4 fL (ref 7.9–10.8)
PLATELETS: 128 x10ˆ3/uL — ABNORMAL LOW (ref 140–440)
RBC: 3.15 x10ˆ6/uL — ABNORMAL LOW (ref 3.63–4.92)
RDW: 19.3 % — ABNORMAL HIGH (ref 12.3–17.7)
WBC: 5.1 x10ˆ3/uL (ref 3.8–11.8)

## 2024-06-25 LAB — BLOOD GAS W/ LACTATE REFLEX
%FIO2 (ARTERIAL): 40 %
BASE EXCESS (ARTERIAL): 4 mmol/L — ABNORMAL HIGH (ref 0.0–3.0)
BICARBONATE (ARTERIAL): 27.9 mmol/L (ref 21.0–28.0)
LACTATE: 0.6 mmol/L (ref <=1.9)
O2 SATURATION (ARTERIAL): 91.3 % (ref 94.0–98.0)
PAO2/FIO2 RATIO: 168
PCO2 (ARTERIAL): 62 mmHg — ABNORMAL HIGH (ref 35–45)
PH (ARTERIAL): 7.32 — ABNORMAL LOW (ref 7.35–7.45)
PO2 (ARTERIAL): 67 mmHg — ABNORMAL LOW (ref 83–108)

## 2024-06-25 LAB — BLOOD GAS W/ CO-OX, LYTES, LACTATE REFLEX
%FIO2 (ARTERIAL): 40 %
%FIO2 (ARTERIAL): 35 %
BASE EXCESS (ARTERIAL): 4.2 mmol/L — ABNORMAL HIGH (ref 0.0–3.0)
BASE EXCESS (ARTERIAL): 5.1 mmol/L — ABNORMAL HIGH (ref 0.0–3.0)
BICARBONATE (ARTERIAL): 28.2 mmol/L — ABNORMAL HIGH (ref 21.0–28.0)
BICARBONATE (ARTERIAL): 28.9 mmol/L — ABNORMAL HIGH (ref 21.0–28.0)
CARBOXYHEMOGLOBIN: 2.3 % (ref <=3.0)
CARBOXYHEMOGLOBIN: 2 % (ref <=3.0)
CHLORIDE: 104 mmol/L (ref 98–107)
CHLORIDE: 106 mmol/L (ref 98–107)
GLUCOSE: 113 mg/dL (ref 65–125)
GLUCOSE: 111 mg/dL (ref 65–125)
HEMATOCRITRT: 28 % — ABNORMAL LOW (ref 37–50)
HEMATOCRITRT: 26 % — ABNORMAL LOW (ref 37–50)
HEMOGLOBIN: 9.4 g/dL — ABNORMAL LOW (ref 12.0–18.0)
HEMOGLOBIN: 8.8 g/dL — ABNORMAL LOW (ref 12.0–18.0)
IONIZED CALCIUM: 1.19 mmol/L (ref 1.15–1.33)
IONIZED CALCIUM: 1.17 mmol/L (ref 1.15–1.33)
LACTATE: 0.6 mmol/L (ref <=1.9)
LACTATE: 0.6 mmol/L (ref <=1.9)
MET-HEMOGLOBIN: 0.7 % (ref <=1.5)
MET-HEMOGLOBIN: 0.7 % (ref <=1.5)
O2 SATURATION (ARTERIAL): 94.6 % (ref 94.0–98.0)
O2 SATURATION (ARTERIAL): 95 % (ref 94.0–98.0)
O2CT: 12.6 %
O2CT: 11.8 %
OXYHEMOGLOBIN: 94.2 % (ref 90.0–95.0)
OXYHEMOGLOBIN: 94.2 % (ref 90.0–95.0)
PAO2/FIO2 RATIO: 198
PAO2/FIO2 RATIO: 223
PCO2 (ARTERIAL): 59 mmHg — ABNORMAL HIGH (ref 35–45)
PCO2 (ARTERIAL): 54 mmHg — ABNORMAL HIGH (ref 35–45)
PH (ARTERIAL): 7.33 — ABNORMAL LOW (ref 7.35–7.45)
PH (ARTERIAL): 7.37 (ref 7.35–7.45)
PO2 (ARTERIAL): 79 mmHg — ABNORMAL LOW (ref 83–108)
PO2 (ARTERIAL): 78 mmHg — ABNORMAL LOW (ref 83–108)
SODIUM: 137 mmol/L (ref 136–145)
SODIUM: 137 mmol/L (ref 136–145)
WHOLE BLOOD POTASSIUM: 4.5 mmol/L (ref 3.5–5.1)
WHOLE BLOOD POTASSIUM: 4.6 mmol/L (ref 3.5–5.1)

## 2024-06-25 LAB — COMPREHENSIVE METABOLIC PANEL, NON-FASTING
ALBUMIN/GLOBULIN RATIO: 1.8 — ABNORMAL HIGH (ref 0.8–1.4)
ALBUMIN: 3.4 g/dL — ABNORMAL LOW (ref 3.5–5.7)
ALKALINE PHOSPHATASE: 47 U/L (ref 34–104)
ALT (SGPT): 5 U/L — ABNORMAL LOW (ref 7–52)
ANION GAP: 6 mmol/L (ref 4–13)
AST (SGOT): 12 U/L — ABNORMAL LOW (ref 13–39)
BILIRUBIN TOTAL: 0.7 mg/dL (ref 0.3–1.0)
BUN/CREA RATIO: 33 — ABNORMAL HIGH (ref 6–22)
BUN: 19 mg/dL (ref 7–25)
CALCIUM, CORRECTED: 8.9 mg/dL (ref 8.9–10.8)
CALCIUM: 8.4 mg/dL — ABNORMAL LOW (ref 8.6–10.3)
CHLORIDE: 106 mmol/L (ref 98–107)
CO2 TOTAL: 28 mmol/L (ref 21–31)
CREATININE: 0.58 mg/dL — ABNORMAL LOW (ref 0.60–1.30)
ESTIMATED GFR: 94 mL/min/1.73mˆ2 (ref >59)
GLOBULIN: 1.9 — ABNORMAL LOW (ref 2.0–3.5)
GLUCOSE: 119 mg/dL — ABNORMAL HIGH (ref 74–109)
OSMOLALITY, CALCULATED: 283 mosm/kg (ref 270–290)
POTASSIUM: 4.7 mmol/L (ref 3.5–5.1)
PROTEIN TOTAL: 5.3 g/dL — ABNORMAL LOW (ref 6.4–8.9)
SODIUM: 140 mmol/L (ref 136–145)

## 2024-06-25 LAB — URINALYSIS, MICROSCOPIC
HYALINE CASTS: 3 /LPF — ABNORMAL HIGH (ref <0)
RBCS: 2 /HPF (ref <4)
SQUAMOUS EPITHELIAL: 2 /HPF (ref <28)
WBCS: <1 /HPF (ref <6)

## 2024-06-25 LAB — MAGNESIUM: MAGNESIUM: 2 mg/dL (ref 1.9–2.7)

## 2024-06-25 LAB — URINALYSIS, MACROSCOPIC
BILIRUBIN: NEGATIVE mg/dL
BLOOD: NEGATIVE mg/dL
GLUCOSE: NEGATIVE mg/dL
KETONES: 10 mg/dL — AB
LEUKOCYTES: NEGATIVE WBCs/uL
NITRITE: NEGATIVE
PH: 5.5 (ref 5.0–9.0)
PROTEIN: 10 mg/dL
SPECIFIC GRAVITY: 1.039 — ABNORMAL HIGH (ref 1.002–1.030)
UROBILINOGEN: NORMAL mg/dL

## 2024-06-25 MED ORDER — IPRATROPIUM 0.5 MG-ALBUTEROL 3 MG (2.5 MG BASE)/3 ML NEBULIZATION SOLN
3.0000 mL | INHALATION_SOLUTION | RESPIRATORY_TRACT | Status: DC
  Administered 2024-06-25 – 2024-06-26 (×7): 3 mL via RESPIRATORY_TRACT
  Administered 2024-06-27: 0 mL via RESPIRATORY_TRACT
  Administered 2024-06-27 (×5): 3 mL via RESPIRATORY_TRACT
  Filled 2024-06-25 (×6): qty 3

## 2024-06-25 MED ORDER — IOHEXOL 350 MG IODINE/ML INTRAVENOUS SOLUTION
75.0000 mL | INTRAVENOUS | Status: AC
  Administered 2024-06-25: 75 mL via INTRAVENOUS

## 2024-06-25 MED ORDER — ACETYLCYSTEINE 200 MG/ML (20 %) SOLUTION (RT USE CONFIG)
4.0000 mL | Freq: Three times a day (TID) | Status: DC
  Administered 2024-06-25 – 2024-06-27 (×6): 4 mL via RESPIRATORY_TRACT
  Administered 2024-06-27: 0 mL via RESPIRATORY_TRACT
  Administered 2024-06-28 – 2024-06-29 (×4): 4 mL via RESPIRATORY_TRACT
  Filled 2024-06-25 (×5): qty 4

## 2024-06-25 NOTE — Respiratory Therapy (Signed)
 06/25/24 0134   Non-Invasive Ventilation Assessment   Start Time 0130   Orders Updated in EMR Yes   $ NIV NIV - initial   NIV Status Placed On Bipap   Oxygen  Concentration (%) 30   Circuit Checked   H2O Bag Checked   Hepa Filter Checked   Non-Invasive Ventilation Check   Type of Mask FFM   NG/OG Placed No   Facial Skin Integrity WNL   Device Hamilton C1    Mode NIV-ST   Set Rate 5   Spontaneous Rate 23 Breaths Per Minute   Minute Volume 9.1 LPM   FiO2 Set 30%   Pressure Support (Above PEEP) 8 cmH2O   PEEP/CPAP (cm/H2O) 8 CM H20   IPAP 16 cmH20   EPAP 8 cmH2O   PIP 17 cmH2O   SpO2 95 %   Alarms   Audible Alarms Checked & Functioning   Hi Rate 40   Lo Rate 5   Hi Pressure 40 cmH2O   Lo Pressure 5 cmH2O   Low Volume 100 m L   Apnea Alarm 20 Seconds   Low Min Ventilation 4   Timepoint   Stop Time 0136   Check Duration 6 minutes     Patient placed on BiPAP, due to ABG results.

## 2024-06-25 NOTE — Respiratory Therapy (Signed)
 06/25/24 0319   Non-Invasive Ventilation Assessment   Start Time 0319   Orders Updated in EMR Yes   $ NIV NIV - subsequent   NIV Status Currently On   Non-Invasive Ventilation Check   FiO2 Set 35%   IPAP 20 cmH20   EPAP 10 cmH2O   SpO2 95 %     BiPAP setting changed to 20/10/35% according to last ABG results.

## 2024-06-25 NOTE — Respiratory Therapy (Signed)
 06/25/24 1715   RT Oxygen  Therapy   Start Time 1715   Oxygen  Check/Change Changed To   Stridor Noted No   $ O2 Delivery NC   Flow (L/min) (Oxygen  Therapy) 3   Stop Time 1716     Pt taken off HFNC and placed on 3L NC. Pt tolerating well. SpO2 93%

## 2024-06-25 NOTE — Respiratory Therapy (Signed)
 06/25/24 0005   High Flow Nasal Cannula   Start Time 0005   Order Updated in EMR Yes   Equipment Adult   HFNC Device Hamilton C1   HFNC Status Currently On   Facial Skin Integrity WNL   H2O Bottle Check (HFNC) Checked   Circuit Checked   Temperature 31 C (87.8 F)   Flow  60 LPM   FiO2 50 %   SpO2 92   $HFNC Subsequent Charge (Resp only) HFNC-Sub   $Type of Circuit Changed (Resp only) HFNC   Stop Time 0010   Duration 5 Minutes       Changes made. Patient requires higher flow due to COPD. ABG to follow

## 2024-06-25 NOTE — Respiratory Therapy (Signed)
 Patient placed on BiPAP 20/10 35%

## 2024-06-25 NOTE — Respiratory Therapy (Signed)
 06/25/24 0544   High Flow Nasal Cannula   Start Time 0544   Order Updated in EMR Yes   Equipment Adult   HFNC Device Hamilton C1   HFNC Status Currently On  (Bipap.)     Patient is currently on BiPAP.

## 2024-06-25 NOTE — Respiratory Therapy (Signed)
 06/25/24 2354   AIRWAY MONITOR DURING ACTIVITY ASSIST   Start Time 2350   Stop Time 2354  (Called by RN to ask Bipap mask, because machine was alarmaing after patient adjusted mask.)

## 2024-06-25 NOTE — PT Treatment (Addendum)
 [facility] Medicine [facility]  [address]  [place], [address]  [phone]  (Fax) [phone]  Rehabilitation Department  Physical Therapy Daily Inpatient Note    Date: 06/25/2024  Patient's Name: Misty Johnson  Date of Birth: 1950/08/14  Height: Height: 162.6 cm (5' 4)  Weight: Weight: 76.1 kg (167 lb 12.8 oz)      Plan: Will continue under current POC.      Discharge Disposition: (P) inpatient rehabilitation facility, TBD        Subjective/Objective/Assessment:  Flowsheet    06/25/24 1356   Rehab Session   Document Type therapy progress note (daily note)   PT Visit Date 06/25/24   Total PT Minutes: 24   Patient Effort good   General Information   Patient Profile Reviewed yes   Medical Lines PIV Line;Telemetry   Respiratory Status high flow nasal cannula   Existing Precautions/Restrictions fall precautions;full code   Pre Treatment Status   Pre Treatment Patient Status Patient supine in bed;Call light within reach;Patient safety alarm activated;Nurse approved session   Support Present Pre Treatment  None   Cognition   Behavior/Mood Observations behavior appropriate to situation, WNL/WFL   Vital Signs   Pre-Treatment Heart Rate (beats/min) 65   Post-treatment Heart Rate (beats/min) 64   Pre SpO2 (%) 95   O2 Delivery Pre Treatment supplemental O2   Post SpO2 (%) 88   O2 Delivery Post Treatment supplemental O2   Pain Assessment   Pretreatment Pain Rating 0/10 - no pain   Posttreatment Pain Rating 0/10 - no pain   Bed Mobility   Supine-Sit [place] stand-by assistance   Sit to Supine, Independence stand-by assistance   Transfer Assessment/Treatment   Sit-Stand Independence contact guard assist   Stand-Sit Independence contact guard assist   Bed-Chair Independence minimum assist (75% patient effort)   Gait Assessment/Treatment   Total Distance Ambulated 10   Independence  minimum assist (75% patient effort);1 person + 1 person to manage equipment   Balance   Sitting Balance: Static fair  + balance   Sitting Balance: Dynamic fair - balance   Sit-to-Stand Balance poor + balance   Standing Balance: Static fair - balance   Standing Balance: Dynamic fair - balance   Post Treatment Status   Post Treatment Patient Status Patient sitting in bedside chair or w/c;Call light within reach;Patient safety alarm activated   Support Present Post Treatment  None   Physical Therapy Clinical Impression   Assessment Patient SPV from supine to sitting edge of bed. She stands with CGA. She gaits with RW and min assist forward and backward secondary to being attached to HFNC.  She sits on the edge of the bed to rest. She then stands with min assist and transfers to the chair with min assist.  Chair check is activated and needs in reach.   Anticipated Discharge Disposition inpatient rehabilitation facility;TBD       Patients SATS drop to 76% with gait and mobility it requires four minutes of seated rest and pursed lip breathing for SATS to return to 89%.   Goals:     all bed mobility activities  stand-by assistance  bed rails    stand-by assistance  walker, rolling, least restricted assistive device  100 feet  portable O2 and chair to follow as needed    Patient will achieve strength BLE to 4+/5 to aid transfers and gait.    sit-to-stand/stand-to-sit, bed-to-chair/chair-to-bed, toilet  minimum assist (75% patient effort)  walker, rolling, least restrictive  assistive device                     Intervention minutes: THERAPEUTIC ACTIVITY 24 minutes    THERAPIST  Ryane Konieczny, PTA  06/25/2024, 16:23

## 2024-06-25 NOTE — Respiratory Therapy (Signed)
 06/25/24 0140   Non-Invasive Ventilation Assessment   Oxygen  Concentration (%) 40       FI02 increased to 40% due to SP02 85%

## 2024-06-25 NOTE — Respiratory Therapy (Signed)
 06/25/24 1320   High Flow Nasal Cannula   Start Time 1320   Equipment Adult   HFNC Device Hamilton C1   HFNC Status Currently On   Facial Skin Integrity WNL   H2O Bottle Check (HFNC) Checked   Circuit Checked   Temperature 34.6 C (94.3 F)   Flow  40 LPM   FiO2 35 %   SpO2 94   $HFNC Subsequent Charge (Resp only) HFNC-Sub   $Type of Circuit Changed (Resp only) HFNC   Stop Time 1325   Duration 5 Minutes     Pt titrated to 40L and 35%. Pt tolerating well . Care ongoing.

## 2024-06-25 NOTE — Respiratory Therapy (Signed)
 06/25/24 0523   Blood Gas Puncture   Start Time 0523   Blood Gas Type Arterial   Arterial Site right;brachial artery   Collateral Circulation Verified Allen's Test   Site Preparation alcohol    Pressure Held yes   Distal Pulse Present yes   Hematoma Present no   Patient Tolerance Good   Sample Obtained/Sent to Lab yes   Oxygen  Amount FiO2 (specify)  (35)   Stop Time 0544   Test Duration 21 minutes   $ RT ABG Puncture Charge ABG Puncture Performed     Results sent to RN.

## 2024-06-25 NOTE — OT Evaluation (Signed)
 [facility] Medicine [facility]  [address]  [place], [address]  [phone]  (Fax) [phone]  Rehabilitation Services  Occupational Therapy Inpatient Initial Evaluation      Patient Name: Misty Johnson  Date of Birth: 1950-12-05  Height: Height: 162.6 cm (5' 4)  Weight: Weight: 76.1 kg (167 lb 12.8 oz)  Room/Bed: 313/B  Payor: [employer] MEDICARE / Plan: [employer] MEDICARE / Product Type: MEDICARE [facility] /         PMH:   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 Patient is O2 dependent and smokes half a pack a day. Patient admitted with acute on chronic respiratory failure and pneumonia. OT orders received for eval/treat. Patient receptive to OT evaluation, follow-up ADL training demonstrating SBA with bed mobility, sit-to-stand and stand to sit transfers, participation in upper body ADLs extra time and SBA, Min assist with lower body ADLs to prevent desaturation of O2.  Patient wearing high-flow nasal cannula, reporting having difficulty keeping it maintained in place.  OT providing education and strategies for PLB, EC/WS inpatient return demo understanding and application sitting EOB.  Patient yielding positive functional outcome increasing SpO2 from 82% to 90% sitting EOB using PLB.  OT following with report to nursing recommending skilled nursing facility followed by home health and goal of outpatient pulmonary rehab per medical necessity,  but patient is expressing that she wants to go home for Christmas, that her daughter will assist her with whatever she needs.  OT recommending restorative nursing care for safety with functional transfers and short distance mobility, monitoring of vitals until medically cleared and safe for discharge.    Discharge Needs:   Equipment Recommendation:  Tub bench, handheld shower sprayer    The patient presents with mobility limitations due to impaired functional activity tolerance that significantly impair/prevent patients ability to participate in mobility-related activities of daily living (MRADLs) including  ambulation and transfers in order to safely complete, toileting, bathing, food preparation, laundering/household tasks, safely entering/exiting the home. This functional mobility deficit can be sufficiently resolved with the use of a Anticipated Equipment Needs at Discharge: (P) tub bench, front wheeled walker (Grab bar and handheld shower sprayer)  in order to decrease the risk of falls, morbidity, and mortality in performance of these MRADLs.  Patient is able to safely use this assistive device.    Discharge Disposition:  Skilled nursing facility, home health, outpatient pulmonary rehab    JUSTIFICATION OF DISCHARGE RECOMMENDATION   Based on current diagnosis, functional performance prior to admission, and current functional performance, this patient requires continued OT services in    in order to achieve significant functional improvements.    Plan:   Current Intervention:  Predicted Duration of Therapy: (P) evaluation only    To provide Occupational  therapy services  Therapy Frequency: (P) Evaluation Only for duration of Predicted Duration of Therapy: (P) evaluation only  .       The risks/benefits of therapy have been discussed with the patient/caregiver and he/she is in agreement with the established plan of care.       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  Patient is O2 dependent and smokes half a pack a day.  Patient admitted with acute on chronic respiratory failure and pneumonia.  OT orders received for eval/treat.   Medical Lines PIV Line;Telemetry   Respiratory Status high flow nasal cannula   Existing Precautions/Restrictions full code;fall precautions;oxygen  therapy device and L/min   Pre Treatment Status   Pre Treatment Patient Status Patient supine in bed;Call light within reach;Telephone within reach;Patient safety alarm activated;Nurse approved session   Support Present Pre Treatment  None   Communication Pre Treatment  Charge Nurse   Communication Pre Treatment Comment Cleared   Mutuality/Individual Preferences   Anxieties, Fears or Concerns Wants to go home for Christmas   Plan of Care Reviewed With patient   Patient would like to participate in bedside shift report Yes   Living Environment   Lives With child(ren), adult  (Son, Lynwood who lives with patient and daughter who lives nearby, also assist patient with ADLs)   Living Arrangements house   Home Assessment: No  Problems Identified   Home Accessibility stairs to enter home   Home Main Entrance   Number of Stairs, Main Entrance five   Surface of Stairs, Main Entrance concrete   Stair Railings, Main Entrance railings on both sides of stairs   Functional Level Prior   Bathing 3 - assistive equipment and person   Prior Functional Level Comment Patient has SPC and FWW for use at home, tub/shower combo, 1 grab bar, a shower chair, no handheld shower sprayer.  Patient reports that her daughter assisted her with bathing, dressing as needed, took her to doctor's appointments and son also assist with IADLs, medication management.  Patient has slowed down due do recent changes in health status.  Reported she was discharged from home health she believes last Wednesday   Self-Care   Usual Activity Tolerance good   Current Activity Tolerance moderate  Important Activities family;hobbies;spiritual   Pre- Treatment Vital Signs   Pre-Treatment Heart Rate (beats/min) 75   Pre SpO2 (%) 99   O2 Delivery Pre Treatment supplemental O2  (High-flow NC)   Pre-Treatment Pain   Pretreatment Pain Rating 0/10 - no pain   Coping/Psychosocial   Observed Emotional State calm;cooperative   Verbalized Emotional State acceptance   Family/Support System   Family/Support Persons family   Cognitive Assessment/Interventions   Behavior/Mood Observations behavior appropriate to situation, WNL/WFL   Orientation Status oriented x 4   Attention WNL/WFL   Follows Commands WFL   Vision Assessment/Interventions   Visual Impairment/Limitations WFL with corrective lenses   RUE Assessment   RUE Assessment WFL- Within Functional Limits   LUE Assessment   LUE Assessment WFL- Within Functional Limits   Grip Strength   Grip Left (4/5) good, left   Right Grip (4/5) good, right   Bed Mobility   Supine-Sit Independence stand-by assistance   Sit to Supine, Independence stand-by assistance   Bed Mobility, Assistive Device bed rails   Transfer Assessment/Treatment   Sit-Stand  Independence stand-by assistance   Stand-Sit Independence stand-by assistance   Transfer Comment Able to stand from bed with SBA and FWW with OT monitoring vitals as SpO2 decreased to 82%, patient practicing pursed lip breathing and SpO2 increasing to 90% before sitting after 2 minutes   Post Treatment Status   Post Treatment Patient Status Patient supine in bed;Call light within reach;Telephone within reach;Patient safety alarm activated   Support Present Post Treatment  None   Communication Post Treatement Charge Nurse   Communication Post Treatment Comment Updates provided on patient's current level functionally demonstrating SBA, extra time, cues for use of PLB.  Patient wants to go home.  OT recommending skilled nursing facility, home health followed by outpatient pulmonary rehab per medical necessity.   Post-Treatment Vital Signs   Post-treatment Heart Rate (beats/min) 74   Post SpO2 (%) 96   O2 Delivery Post Treatment supplemental O2   Planned Therapy Interventions, OT Eval   Planned Therapy Interventions ADL retraining;bed mobility training;fine motor coordination training;endurance training;ROM (range of motion);transfer training   Clinical Impression   Criteria for Skilled Therapeutic Interventions Met (OT) yes;skilled treatment is necessary;meets criteria   Rehab Potential good   Therapy Frequency Evaluation Only   Predicted Duration of Therapy evaluation only   Anticipated Equipment Needs at Discharge tub bench;front wheeled walker  (Grab bar and handheld shower sprayer)   Evaluation Complexity Justification   Occupational Profile Review Brief history   Performance Deficits 3-5 deficits   Clinical Decision Making Low analytic complexity   Evaluation Complexity Low       TREATMENT PLAN: ADL/IADL TRAINING, WORK SIMPLIFICATION/ENERGY CONSERVATION, PLB/DB/PACING TRAINING, EDUCATION, and DME/AE TRAINING  EVALUATION COMPLEXITY: CLINICAL DECISION MAKING OF LOW COMPLEXITY AS INDICATED BY PMH, OCCUPATIONAL  THERAPY ASSESSMENT OF MUSCULOSKELETAL AND NEUROLOGICAL SYSTEMS AND ACTIVITY LIMITATIONS. CLINICAL PRESENTATION IS STABLE AND UNCOMPLICATED.      EVALUATION 9 minutes and ADL/IADL TRAINING 29 minutes    Therapist:      Lucienne Sprang, OT,06/25/2024 15:09

## 2024-06-25 NOTE — Care Management Notes (Signed)
 [facility]  Care Management Initial Evaluation    Patient Name: Misty Johnson  Date of Birth: [birth year]  Sex: female  Date/Time of Admission: 06/21/2024  3:48 PM  Room/Bed: 313/B  Payor: HU[employer]NA MEDICARE / Plan: MYLENE MEDICARE / Product Type: MEDICARE MC /   Primary Care Providers:  Hamp Knee, APRN, CNP, APRN, C* (General)    Pharmacy Info:   Preferred Pharmacy       [facility] [address] - [place], [place] - [address]    [address] [place] [address]    Phone: [phone] Fax: [phone]    Hours: Not open 24 hours    [facility] [address], [place] - [address]    [address] [place] [address]    Phone: [phone] Fax: [phone]    Hours: Not open 24 hours          Emergency Contact Info:   Extended Emergency Contact Information  Primary Emergency Contact: Oriley,[name]  Mobile Phone: [phone]  Relation: Daughter  Interpreter needed? No    History:   Micheline Markes is a 73 y.o., female, admitted 06/21/24    Height/Weight: 162.6 cm (5' 4) / 76.1 kg (167 lb 12.8 oz)     LOS: 4 days   Admitting Diagnosis: Pneumonia [J18.9]    Assessment:    06/25/24 1559   Assessment Details   Assessment Type Admission   Date of Care Management Update 06/25/24   Readmission   Is this a readmission? No   Insurance Information/Type   Insurance type Medicare   Employment/Financial   Patient has Prescription Coverage?  Yes        Name of Insurance Coverage for Medications [employer]   Financial/Environmental Concerns none   Living Environment   Lives With child(ren), adult   Living Arrangements house   Able to Return to Prior Arrangements yes   Living Arrangement Comments Pt lives with her son in a one story house.   Home Safety   Home Assessment: No Problems Identified   Home Accessibility stairs to enter home   Care Management Plan   Discharge Planning Status initial meeting   Projected Discharge Date 07/01/24   Discharge plan discussed with:  Patient   CM will evaluate for rehabilitation potential yes   Patient choice offered to patient/family Offered, but patient/family declined   Facility or Agency Preferences CM offered inpt rehab.  Pt states her discharge goals are to return home.  She is agreeable to [facility] at discharge.   Discharge Needs Assessment   Equipment Currently Used at Home walker, front wheeled;wheelchair;cane, straight;commode;shower chair   Equipment Needed After Discharge walker, rolling;wheelchair;cane, straight;shower chair   Discharge Facility/Level of Care Needs Home vs Home with Home Health   Transportation Available car;family or friend will provide   Referral Information   Admission Type inpatient   Address Verified verified-no changes   Arrived From home or self-care   ADVANCE DIRECTIVES   Does the Patient have an Advance Directive? No, Information Offered and Given           Discharge Plan:  Home vs home with Home Health    Initial CM assessment completed.  Pt lives with her son in a one story house.  She also has a daughter who lives close and assists with pts transportation.  She states she wears home O2 @3L -does not know name of DME compnay.  She uses a walker, wheelchair, cane, BSC, and showerchair.  She denies HH prior to admission.  She is currently on HFNC 45L/45%.  CM discussed inpt rehab and pt declines at this time.  She is agreeable to [facility] at discharge.  Pt states discharge goals at this time are home with [facility].   CM will need to be pend order in epic and inniate referral in careport when pt is closer to discharge status.    The patient will continue to be evaluated for developing discharge needs.     Case Manager: Glade Molt, RN  Phone: [phone]

## 2024-06-25 NOTE — Respiratory Therapy (Signed)
 06/25/24 0352   Non-Invasive Ventilation Assessment   Start Time 0355   Orders Updated in EMR Yes   $ NIV NIV - subsequent   NIV Status Currently On   $ O2 Delivery BP   Circuit Checked   Proximal  Temperature 35 C (95 F)   H2O Bag Checked   Hepa Filter Checked   Non-Invasive Ventilation Check   Type of Mask FFM   NG/OG Placed No   Facial Skin Integrity WNL   Device Hamilton C1    Mode NIV-ST   Spontaneous Volume 433 mL   Set Rate 5   Spontaneous Rate 22 Breaths Per Minute   Minute Volume 9.4 LPM   Pressure Support (Above PEEP) 10 cmH2O   PEEP/CPAP (cm/H2O) 10 CM H20   IPAP 20 cmH20   EPAP 10 cmH2O   PIP 22 cmH2O   I-Time 0.5 seconds   SpO2 95 %   Alarms   Audible Alarms Checked & Functioning   Hi Rate 40   Lo Rate 5   Hi Pressure 40 cmH2O   Lo Pressure 5 cmH2O   Low Volume 100 m L   Leak 3 mL   Apnea Alarm 20 Seconds   Low Min Ventilation 4   Timepoint   Stop Time 0358   Check Duration 3 minutes     Changes made to Biapap settings increased to 20/10 FIO2 decreased to 35%, because of ABG results.

## 2024-06-25 NOTE — Respiratory Therapy (Signed)
 06/25/24 0140   High Flow Nasal Cannula   Start Time 0140   Order Updated in EMR Yes   Equipment Adult   HFNC Status Currently On  (Bipap)     Patient currently on BiPAP after ABG results.

## 2024-06-25 NOTE — Respiratory Therapy (Signed)
 06/25/24 0112   Blood Gas Puncture   Start Time 0057   Blood Gas Type Arterial   Site Preparation alcohol    Pressure Held yes   Distal Pulse Present yes   Hematoma Present no   Patient Tolerance Good   Sample Obtained/Sent to Lab yes   Oxygen  Amount FiO2 (specify)  (40)   Stop Time 0113   Test Duration 16 minutes   $ RT ABG Puncture Charge ABG Puncture Performed     ABG results sent to [employer]

## 2024-06-25 NOTE — Care Plan (Signed)
 Pt admitted with Pneumonia. Pt is on hi pressure mask. Pt up with 2 assist. Fall prevention protocol maintained. Bed alarm set with call light in reach. Education is ongoing. Discharge planning is in place.   Problem: Adult Inpatient Plan of Care  Goal: Absence of Hospital-Acquired Illness or Injury  Outcome: Ongoing (see interventions/notes)  Intervention: Identify and Manage Fall Risk  Recent Flowsheet Documentation  Taken 06/24/2024 2055 by Albino DASEN, RN  Safety Promotion/Fall Prevention:   activity supervised   safety round/check completed   nonskid shoes/slippers when out of bed   fall prevention program maintained  Intervention: Prevent Skin Injury  Recent Flowsheet Documentation  Taken 06/25/2024 0200 by Albino DASEN, RN  Body Position: supine, head elevated  Taken 06/25/2024 0000 by Albino DASEN, RN  Body Position: supine, head elevated  Taken 06/24/2024 2154 by Albino DASEN, RN  Body Position: supine, head elevated  Taken 06/24/2024 2055 by Albino DASEN, RN  Body Position: neutral head position, midline maintained  Skin Protection: adhesive use limited  Taken 06/24/2024 2000 by Albino DASEN, RN  Body Position: supine, head elevated  Intervention: Prevent and Manage VTE (Venous Thromboembolism) Risk  Recent Flowsheet Documentation  Taken 06/24/2024 2055 by Albino DASEN, RN  VTE Prevention/Management: dorsiflexion/plantar flexion performed  Intervention: Prevent Infection  Recent Flowsheet Documentation  Taken 06/24/2024 2055 by Albino DASEN, RN  Infection Prevention:   rest/sleep promoted   promote handwashing  Goal: Optimal Comfort and Wellbeing  Outcome: Ongoing (see interventions/notes)  Intervention: Provide Person-Centered Care  Recent Flowsheet Documentation  Taken 06/24/2024 2055 by Albino DASEN, RN  Trust Relationship/Rapport: care explained  Goal: Rounds/Family Conference  Outcome: Ongoing (see interventions/notes)     Problem: Skin Injury Risk Increased  Goal: Skin Health and Integrity  Outcome: Ongoing (see  interventions/notes)  Intervention: Optimize Skin Protection  Recent Flowsheet Documentation  Taken 06/24/2024 2300 by Albino DASEN, RN  Pressure Reduction Techniques: Frequent weight shifting encouraged  Pressure Reduction Devices: Repositioning wedges/pillows utilized  Taken 06/24/2024 2055 by Albino DASEN, RN  Pressure Reduction Techniques: Frequent weight shifting encouraged  Pressure Reduction Devices: Repositioning wedges/pillows utilized  Skin Protection: adhesive use limited  Head of Bed (HOB) Positioning: HOB elevated     Problem: Pneumonia  Goal: Fluid Balance  Outcome: Ongoing (see interventions/notes)  Goal: Absence of Infection Signs and Symptoms  Outcome: Ongoing (see interventions/notes)  Intervention: Prevent Infection Progression  Recent Flowsheet Documentation  Taken 06/24/2024 2055 by Albino DASEN, RN  Fever Reduction/Comfort Measures:   lightweight clothing   lightweight bedding  Goal: Effective Oxygenation and Ventilation  Outcome: Ongoing (see interventions/notes)  Intervention: Optimize Oxygenation and Ventilation  Recent Flowsheet Documentation  Taken 06/24/2024 2055 by Albino DASEN, RN  Head of Bed [facility]) Positioning: [facility] elevated     Problem: Fall Injury Risk  Goal: Absence of Fall and Fall-Related Injury  Outcome: Ongoing (see interventions/notes)  Intervention: Identify and Manage Contributors  Recent Flowsheet Documentation  Taken 06/24/2024 2055 by Albino DASEN, RN  Self-Care Promotion: independence encouraged  Intervention: Promote Injury-Free Environment  Recent Flowsheet Documentation  Taken 06/24/2024 2055 by Albino DASEN, RN  Safety Promotion/Fall Prevention:   activity supervised   safety round/check completed   nonskid shoes/slippers when out of bed   fall prevention program maintained     Problem: Health Knowledge, Opportunity to Enhance (Adult,Obstetrics,Pediatric)  Goal: Knowledgeable about Health Subject/Topic  Description: Patient will demonstrate the desired outcomes by discharge/transition of  care.  Outcome: Ongoing (see interventions/notes)  Intervention: Enhance Health Knowledge  Recent Flowsheet  Documentation  Taken 06/24/2024 2055 by Albino DASEN, RN  Family/Support System Care: caregiver stress acknowledged     Problem: Gas Exchange Impaired  Goal: Optimal Gas Exchange  Outcome: Ongoing (see interventions/notes)  Intervention: Optimize Oxygenation and Ventilation  Recent Flowsheet Documentation  Taken 06/24/2024 2055 by Albino DASEN, RN  Head of Bed [facility]) Positioning: HOB elevated

## 2024-06-25 NOTE — Progress Notes (Signed)
 [facility]    HOSPITALIST PROGRESS NOTE    Misty Johnson  Date of service: 06/25/2024  Date of Admission:  06/21/2024  Hospital Day:  LOS: 4 days     Interval History:   Patient is seen in follow-up on 06/25/2024:  She is on high-flow nasal cannula, denies any increased shortness a breath currently.  Reports she is starting to cough up some thick yellow phlegm.  CT of the chest completed, small pleural effusions bilaterally, consolidation.  Afebrile.  No bleeding noted.  Hemoglobin at 7.9 today.      Filed Vitals:    06/25/24 0544 06/25/24 0711 06/25/24 0833 06/25/24 1045   BP:  (!) 127/51 (!) 127/51    Pulse:  66 72    Resp:  18     Temp:  36.7 C (98 F)     SpO2: 95% 96%  96%       Intake/Output Summary (Last 24 hours) at 06/25/2024 1214  Last data filed at 06/25/2024 1113  Gross per 24 hour   Intake 868 ml   Output 510 ml   Net 358 ml       Start Time: 0903  $ O2 Delivery: High Flow Nasal Cannula  Flow (L/min) (RT): 35  FiO2 (RT): 48 %  SpO2: 96 %         Scheduled Medication PRN Medications     ampicillin -sulbactam (UNASYN ) 3 g in NS 100 mL IVPB with adaptor, 3 g, Q6H    arformoterol  (BROVANA ) 15 mcg/2 mL nebulizer solution, 15 mcg, 2x/day **AND** revefenacin  (YUPELRI ) 175 mcg/3 mL nebulizer solution, 175 mcg, Daily **AND** budesonide  (PULMICORT  RESPULES) 0.5 mg/2 mL nebulizer suspension, 0.5 mg, 2x/day    atorvastatin  (LIPITOR) tablet, 40 mg, QPM    doxycycline  tablet, 100 mg, 2x/day    enoxaparin  PF (LOVENOX ) 40 mg/0.4 mL SubQ injection, 40 mg, Q24H    escitalopram  (LEXAPRO ) tablet, 20 mg, Daily    [Held by provider] furosemide  (LASIX ) tablet, 40 mg, Daily    guaiFENesin  (MUCINEX ) extended release tablet - for cough (expectorant), 600 mg, 2x/day    metoprolol  succinate (TOPROL -XL) 24 hr extended release tablet, 25 mg, Daily    NS flush syringe, 3 mL, Q8HRS    pantoprazole  (PROTONIX ) delayed release tablet, 40 mg, Daily    ramelteon  (ROZEREM ) tablet, 8 mg, NIGHTLY    rOPINIRole   (REQUIP ) tablet, 0.25 mg, QPM    acetaminophen  (TYLENOL ) tablet, 650 mg, Q6H PRN    NS 250 mL flush bag, , Q15 Min PRN **AND** D5W 250 mL flush bag, , Q15 Min PRN **AND** Small Volume Medication Infusion Line Flush, , UNTIL DISCONTINUED    NS 250 mL flush bag, , Q15 Min PRN **AND** D5W 250 mL flush bag, , Q15 Min PRN **AND** Small Volume Medication Infusion Line Flush, , UNTIL DISCONTINUED    NS 250 mL flush bag, , Q15 Min PRN **AND** D5W 250 mL flush bag, , Q15 Min PRN **AND** Small Volume Medication Infusion Line Flush, , UNTIL DISCONTINUED    NS 250 mL flush bag, , Q15 Min PRN **AND** D5W 250 mL flush bag, , Q15 Min PRN **AND** Small Volume Medication Infusion Line Flush, , UNTIL DISCONTINUED    ipratropium-albuterol  0.5 mg-3 mg(2.5 mg base)/3 mL Solution for Nebulization, 3 mL, Q4H PRN    sennosides-docusate sodium  (SENOKOT-S) 8.6-50mg  per tablet, 1 Tablet, 2x/day PRN      Insulin Sliding Scale Requirement:  Antihyperglycemics (last 24 hours)  None             Labs:  CBC Results CMP RESULTS   Recent Labs     06/23/24  0453 06/24/24  0356 06/25/24  0435   WBC 7.0 6.0 5.1   RBC 3.12* 3.31* 3.15*   HGB 7.8* 8.4* 7.9*   HCT 25.2* 26.8* 26.3*   MCV 80.5 81.1 83.4   MCH 25.1 25.2 25.1   MCHC 31.2* 31.1* 30.1*   RDW 18.7* 18.8* 19.3*   PLTCNT 125* 114* 128*   MPV 10.6 10.1 10.4     Recent Labs     06/23/24  0453 06/24/24  0356 06/25/24  0258 06/25/24  0435 06/25/24  0530   SODIUM 144 143 137 140 137   POTASSIUM 3.9 4.1  --  4.7  --    CHLORIDE 108* 108* 104 106 106   CO2 33* 31  --  28  --    ANIONGAP 3* 4  --  6  --    BUN 21 18  --  19  --    CREATININE 0.78 0.61  --  0.58*  --    BUNCRRATIO 27* 30*  --  33*  --    GFR 79 93  --  94  --    ALBUMIN 3.5 3.5  --  3.4*  --    CALCIUM  8.8 8.6  --  8.4*  --    GLUCOSENF 89 99  --  119*  --    ALKPHOS 45 47  --  47  --    ALT 5* 6*  --  5*  --    AST 13 16  --  12*  --    TOTBILIRUBIN 0.5 0.6  --  0.7  --    TOTALPROTEIN 5.4* 5.5*  --  5.3*  --    ALBGLOB 1.8* 1.8*  --   1.8*  --    OSMBLD 289 287  --  283  --    CORCA 9.2 9.0  --  8.9  --    GLOBULIN 1.9* 2.0  --  1.9*  --       OTHER CHEMISTRIES RESULTS CREATININE CLEARENCE   Recent Labs     06/23/24  0453 06/24/24  0356 06/25/24  0435   MAGNESIUM  2.1 1.9 2.0    Renal Creatinine (72h ago through now)       Date/Time Serum Creatinine Range CrCl    06/25/24 0435 0.58 mg/dL   9.39 - 8.69 mg/dL 16.2 mL/min    87/77/74 0356 0.61 mg/dL   9.39 - 8.69 mg/dL 20.3 mL/min    87/78/74 0453 0.78 mg/dL   9.39 - 8.69 mg/dL 37.6 mL/min            COAG RESULTS CARDIAC MARKERS   No results for input(s): APTT, PROTHROMTME, INR in the last 72 hours.  No results for input(s): TROPONINI, BNP, NTPROBNP, LACTICACID, LIPASE in the last 72 hours.     HA1C TSH LIPID PANEL   No results found for: HA1C   No results found for: TSH   No results found for: CHOLESTEROL, TRIG, HDLCHOL, LDLCHOL, VLDLCAL, CHOLHDLRATIO       Assessment & Plan:    Bilateral pneumonia, suspected Gram-positive  Acute on chronic hypoxic respiratory failure  -CXR showing bibasilar atelectasis vs PNA   -titrate O2 to maintain O2 sat 89-92%  -neb treatments as needed   -incentive spirometry   -continue unasyn  and doxycycline    -mucinex    -  On high-flow nasal cannula  - Chest CT showed small pleural effusions, consolidation        Melena   Chronic iron  deficiency anemia   -stool for occult blood +  -monitor Hgb   -iron  studies reviewed   -Venofer  x 3 doses   - GI following, EGD completed yesterday.  Moderate distal esophagitis, LA grade a, small hiatal hernia, mild gastritis, no ulcers or bleeding seen.  Continue Protonix  40 mg daily.  Elective colonoscopy as an outpatient after pneumonia resolves.     Depression   - Resume Lexapro      History of CHF though last two echos normal   -euvolemic at this time    Disposition Planning:  To be determined      IVF:   Current Facility-Administered Medications   Medication Dose Frequency Last Rate     GI Prophylaxis :  Pantoprazole   Bowel Regimen:  Senokot  Date of Last Bowel Movement: 06/22/24  DVT/PE Prophylaxis :    Daily Orders (From admission, onward)       Start     Ordered    06/21/24 1900  enoxaparin  PF (LOVENOX ) 40 mg/0.4 mL SubQ injection  (MEDIUM RISK VTE LEVEL)  EVERY 24 HOURS        Question:  Select Indication of Use  Answer:  VTE Prophylaxis    06/21/24 1838    06/21/24 1845  PT IS INTERMEDIATE RISK FOR VENOUS THROMBOEMBOLISM  (MEDIUM RISK VTE LEVEL)  CONTINUOUS        References:    VTE RISK ASSESSMENT TOOL    06/21/24 1838                   Anticoagulants (last 24 hours)       None           Lines:   Patient Lines/Drains/Airways Status       Active Line / Dialysis Catheter / Dialysis Graft / Drain / Airway / Wound       Name Placement date Placement time Site Days    Peripheral IV Posterior;Right Wrist 06/22/24  1823  -- 2                   Diet: Nutrition:    DIET CARDIAC (2G NA, LOWFAT, LOW CHOL) Do you want to initiate MNT Protocol? Yes; Additional modifications/limitations: GI/SOFT    Additional clinical characteristics related to nutrition:    - monitor for weight changes   - monitor intake and output    - monitor bowel functions                 On the day of the encounter, a total of 25 minutes was spent on this patient encounter including review of historical information, examination, documentation and post-visit activities. The time documented excludes procedural time and time spent on wellness portion of the visit.     Powell Corona, APRN, CNP  06/25/2024  [facility] HOSPITALIST

## 2024-06-25 NOTE — Respiratory Therapy (Signed)
 Latest Reference Range & Units 06/25/24 05:30   %FIO2 % 35   PH 7.35 - 7.45  7.37   PCO2 35 - 45 mm/Hg 54 (H)   PO2 83 - 108 mm/Hg 78 (L)   BICARBONATE 21.0 - 28.0 mmol/L 28.9 (H)   BASE EXCESS 0.0 - 3.0 mmol/L 5.1 (H)   PAO2/FIO2 RATIO  223   O2CT % 11.8   O2 SATURATION (ARTERIAL) 94.0 - 98.0 % 95.0   HEMATOCRITRT 37 - 50 % 26 (L)   (H): Data is abnormally high  (L): Data is abnormally low    Results sent to RN by secure message.

## 2024-06-25 NOTE — Respiratory Therapy (Addendum)
 06/25/24 0300   Blood Gas Puncture   Start Time 0257   Blood Gas Type Arterial   Arterial Site right;brachial artery   Collateral Circulation Verified Allen's Test   Site Preparation alcohol    Pressure Held yes   Distal Pulse Present yes   Hematoma Present no   Patient Tolerance Good   Sample Obtained/Sent to Lab yes   Oxygen  Amount FiO2 (specify)   Stop Time 0312   Test Duration 15 minutes   $ RT ABG Puncture Charge ABG Puncture Performed     Results secure messaged to [employer].

## 2024-06-25 NOTE — Consults (Signed)
 [facility]  Gastroenterology/ Hepatology Consult Note      Patient: Misty Johnson, Misty Johnson, 73 y.o. female  Date of Birth: [birth year]  Admission Date: 06/21/2024  PCP: Rosina Bhat, APRN, CNP    History of Present Illness:  Resting in bed. HFNC O2. Denies abdominal pain, nausea or vomiting. Denies heartburn. No signs GI bleeding. She had EGD yesterday.     Review of Systems   Constitutional: Negative for fever.     As per HPI    Historical Data   Medications Prior to Admission       Prescriptions    albuterol  sulfate (PROVENTIL ) 2.5 mg /3 mL (0.083 %) Inhalation nebulizer solution    Take 3 mL (2.5 mg total) by nebulization Once per day as needed for Wheezing    Patient not taking:  Reported on 06/22/2024    atorvastatin  (LIPITOR) 40 mg Oral Tablet    Take 1 Tablet (40 mg total) by mouth Every evening    ergocalciferol, vitamin D2, (DRISDOL) 1,250 mcg (50,000 unit) Oral Capsule    Take 1 Capsule (50,000 Units total) by mouth Every 7 days Saturday    escitalopram  oxalate (LEXAPRO ) 20 mg Oral Tablet    Take 1 Tablet (20 mg total) by mouth Daily    fluticasone -umeclidin-vilanter (TRELEGY ELLIPTA) 100-62.5-25 mcg Inhalation Disk with Device    Take 1 Inhalation by inhalation Daily    furosemide  (LASIX ) 40 mg Oral Tablet    Take 1 Tablet (40 mg total) by mouth Daily    ipratropium-albuterol  0.5 mg-3 mg(2.5 mg base)/3 mL Solution for Nebulization    Take 3 mL by nebulization Four times a day    Levocetirizine (XYZAL) 5 mg Oral Tablet    Take 1 Tablet (5 mg total) by mouth Every evening    metoprolol  succinate (TOPROL -XL) 25 mg Oral Tablet Sustained Release 24 hr    Take 1 Tablet (25 mg total) by mouth Daily    omeprazole (PRILOSEC) 20 mg Oral Capsule, Delayed Release(E.C.)    Take 1 Capsule (20 mg total) by mouth Daily    ondansetron  (ZOFRAN  ODT) 4 mg Oral Tablet, Rapid Dissolve    Take 1 Tablet (4 mg total) by mouth Every 8 hours as needed for Nausea/Vomiting    Patient not taking:  Reported on 06/22/2024     OXYGEN -AIR DELIVERY SYSTEMS DEACT    3L NC at home    polyethylene glycol (MIRALAX ) 17 gram Oral Powder in Packet    Take 2 Packets (34 g total) by mouth Twice daily    Patient not taking:  Reported on 06/22/2024    potassium chloride  (K-DUR) 20 mEq Oral Tab Sust.Rel. Particle/Crystal    Take 1 Tablet (20 mEq total) by mouth Daily    revefenacin  (YUPELRI ) 175 mcg/3 mL Inhalation nebulizer solution    Take 3 mL (175 mcg total) by nebulization Daily          Allergies[1]       Vitals:  Temperature: 36.7 C (98 F)  Heart Rate: 72  Respiratory Rate: 18  BP (Non-Invasive): (!) 127/51  SpO2: 96 %    Objective:  General Appearance:  Comfortable, well-appearing and in no acute distress.    Vital signs: (most recent): Blood pressure (!) 127/51, pulse 72, temperature 36.7 C (98 F), resp. rate 18, height 1.626 m (5' 4), weight 76.1 kg (167 lb 12.8 oz), SpO2 96%.  Vital signs are normal.  No fever.    Lungs:  Normal effort and normal respiratory rate.  There are decreased breath sounds and wheezes.    Heart: Normal rate.  Regular rhythm.  S1 normal and S2 normal.    Abdomen: Abdomen is soft.  Bowel sounds are normal.   There is no abdominal tenderness.     Extremities: Normal range of motion.    Pulses: Distal pulses are intact.    Neurological: Patient is alert and oriented to person, place and time.    Skin:  Warm and dry.        Active Hospital Problems   (*Primary Problem)    Diagnosis    *Pneumonia    Anemia, iron  deficiency    Anemia    COPD (chronic obstructive pulmonary disease)       Labs:     Results for orders placed or performed during the hospital encounter of 06/21/24 (from the past 24 hours)   COVID-19, FLU A/B, RSV RAPID BY PCR   Result Value Ref Range    SARS-CoV-2 Not Detected Not Detected    INFLUENZA VIRUS TYPE A Not Detected Not Detected    INFLUENZA VIRUS TYPE B Not Detected Not Detected    RESPIRATORY SYNCTIAL VIRUS (RSV) Not Detected Not Detected   BLOOD GAS W/ LACTATE REFLEX Arterial   Result Value  Ref Range    %FIO2 (ARTERIAL) 40 %    PH (ARTERIAL) 7.32 (L) 7.35 - 7.45    PCO2 (ARTERIAL) 62 (H) 35 - 45 mm/Hg    PO2 (ARTERIAL) 67 (L) 83 - 108 mm/Hg    BICARBONATE (ARTERIAL) 27.9 21.0 - 28.0 mmol/L    BASE EXCESS (ARTERIAL) 4.0 (H) 0.0 - 3.0 mmol/L    PAO2/FIO2 RATIO 168     O2 SATURATION (ARTERIAL) 91.3 94.0 - 98.0 %    LACTATE 0.6 <=1.9 mmol/L   BLOOD GAS W/ CO-OX, LYTES, LACTATE REFLEX Arterial   Result Value Ref Range    %FIO2 (ARTERIAL) 40 %    PH (ARTERIAL) 7.33 (L) 7.35 - 7.45    PCO2 (ARTERIAL) 59 (H) 35 - 45 mm/Hg    PO2 (ARTERIAL) 79 (L) 83 - 108 mm/Hg    BICARBONATE (ARTERIAL) 28.2 (H) 21.0 - 28.0 mmol/L    BASE EXCESS (ARTERIAL) 4.2 (H) 0.0 - 3.0 mmol/L    PAO2/FIO2 RATIO 198     O2 SATURATION (ARTERIAL) 94.6 94.0 - 98.0 %    HEMOGLOBIN 9.4 (L) 12.0 - 18.0 g/dL    HEMATOCRITRT 28 (L) 37 - 50 %    OXYHEMOGLOBIN 94.2 90.0 - 95.0 %    CARBOXYHEMOGLOBIN 2.3 <=3.0 %    MET-HEMOGLOBIN 0.7 <=1.5 %    O2CT 12.6 %    SODIUM 137 136 - 145 mmol/L    HEMOLYSIS None None, Mild    WHOLE BLOOD POTASSIUM 4.5 3.5 - 5.1 mmol/L    CHLORIDE 104 98 - 107 mmol/L    IONIZED CALCIUM  1.19 1.15 - 1.33 mmol/L    GLUCOSE 113 65 - 125 mg/dL    LACTATE 0.6 <=8.0 mmol/L   URINALYSIS, MACROSCOPIC   Result Value Ref Range    COLOR Yellow Colorless, Light Yellow, Yellow    APPEARANCE Clear Clear    SPECIFIC GRAVITY 1.039 (H) 1.002 - 1.030    PH 5.5 5.0 - 9.0    LEUKOCYTES Negative Negative, 100  WBCs/uL    NITRITE Negative Negative    PROTEIN 10 Negative, 10 , 20  mg/dL    GLUCOSE Negative Negative, 30  mg/dL    KETONES 10 (A) Negative, Trace mg/dL  BILIRUBIN Negative Negative, 0.5 mg/dL    BLOOD Negative Negative, 0.03 mg/dL    UROBILINOGEN Normal Normal mg/dL   URINALYSIS, MICROSCOPIC   Result Value Ref Range    MUCOUS Rare Rare, Occasional, Few /hpf    RBCS 2 <4 /hpf    WBCS <1 <6 /hpf    HYALINE CASTS 3 (H) <0 /lpf    SQUAMOUS EPITHELIAL 2 <28 /hpf   CBC   Result Value Ref Range    WBC 5.1 3.8 - 11.8 x103/uL    RBC 3.15 (L)  3.63 - 4.92 x106/uL    HGB 7.9 (L) 10.9 - 14.3 g/dL    HCT 73.6 (L) 68.7 - 41.9 %    MCV 83.4 75.5 - 95.3 fL    MCH 25.1 24.7 - 32.8 pg    MCHC 30.1 (L) 32.3 - 35.6 g/dL    RDW 80.6 (H) 87.6 - 17.7 %    PLATELETS 128 (L) 140 - 440 x103/uL    MPV 10.4 7.9 - 10.8 fL   COMPREHENSIVE METABOLIC PANEL, NON-FASTING   Result Value Ref Range    SODIUM 140 136 - 145 mmol/L    POTASSIUM 4.7 3.5 - 5.1 mmol/L    CHLORIDE 106 98 - 107 mmol/L    CO2 TOTAL 28 21 - 31 mmol/L    ANION GAP 6 4 - 13 mmol/L    BUN 19 7 - 25 mg/dL    CREATININE 9.41 (L) 0.60 - 1.30 mg/dL    BUN/CREA RATIO 33 (H) 6 - 22    ESTIMATED GFR 94 >59 mL/min/1.90m2    ALBUMIN 3.4 (L) 3.5 - 5.7 g/dL    CALCIUM  8.4 (L) 8.6 - 10.3 mg/dL    GLUCOSE 880 (H) 74 - 109 mg/dL    ALKALINE PHOSPHATASE 47 34 - 104 U/L    ALT (SGPT) 5 (L) 7 - 52 U/L    AST (SGOT) 12 (L) 13 - 39 U/L    BILIRUBIN TOTAL 0.7 0.3 - 1.0 mg/dL    PROTEIN TOTAL 5.3 (L) 6.4 - 8.9 g/dL    ALBUMIN/GLOBULIN RATIO 1.8 (H) 0.8 - 1.4    OSMOLALITY, CALCULATED 283 270 - 290 mOsm/kg    CALCIUM , CORRECTED 8.9 8.9 - 10.8 mg/dL    GLOBULIN 1.9 (L) 2.0 - 3.5   MAGNESIUM    Result Value Ref Range    MAGNESIUM  2.0 1.9 - 2.7 mg/dL   BLOOD GAS W/ CO-OX, LYTES, LACTATE REFLEX Arterial   Result Value Ref Range    %FIO2 (ARTERIAL) 35 %    PH (ARTERIAL) 7.37 7.35 - 7.45    PCO2 (ARTERIAL) 54 (H) 35 - 45 mm/Hg    PO2 (ARTERIAL) 78 (L) 83 - 108 mm/Hg    BICARBONATE (ARTERIAL) 28.9 (H) 21.0 - 28.0 mmol/L    BASE EXCESS (ARTERIAL) 5.1 (H) 0.0 - 3.0 mmol/L    PAO2/FIO2 RATIO 223     O2 SATURATION (ARTERIAL) 95.0 94.0 - 98.0 %    HEMOGLOBIN 8.8 (L) 12.0 - 18.0 g/dL    HEMATOCRITRT 26 (L) 37 - 50 %    OXYHEMOGLOBIN 94.2 90.0 - 95.0 %    CARBOXYHEMOGLOBIN 2.0 <=3.0 %    MET-HEMOGLOBIN 0.7 <=1.5 %    O2CT 11.8 %    SODIUM 137 136 - 145 mmol/L    HEMOLYSIS None None, Mild    WHOLE BLOOD POTASSIUM 4.6 3.5 - 5.1 mmol/L    CHLORIDE 106 98 - 107 mmol/L    IONIZED CALCIUM  1.17 1.15 -  1.33 mmol/L    GLUCOSE 111 65 - 125 mg/dL     LACTATE 0.6 <=8.0 mmol/L       Imaging Studies:    Results for orders placed or performed during the hospital encounter of 06/21/24   XR CHEST PA AND LATERAL     Status: None    Narrative    Marsheila Milbrath    RADIOLOGIST: Jacob Sechrist    XR CHEST PA AND LATERAL performed on 06/21/2024 5:49 PM    CLINICAL HISTORY: shortness of breath  sob    TECHNIQUE: Frontal and lateral views of the chest.    COMPARISON:  Chest radiograph dated 02/14/2024    FINDINGS:    Heart size is mildly enlarged.    The mediastinal contour is unremarkable.  Bibasilar opacities have developed.   T8 compression deformity is unchanged with associated exaggerated thoracic kyphosis        Impression    Bibasilar atelectasis or pneumonia      Radiologist location ID: [id number]     CT ABDOMEN PELVIS W IV CONTRAST     Status: None    Narrative    Bryttney Deming    RADIOLOGIST: Lang Speaks    CT ABDOMEN PELVIS W IV CONTRAST performed on 06/21/2024 7:47 PM    CLINICAL HISTORY: melena, hx of cancerous polyps  melena, hx of cancerous polyps, blood in stool surgical hx of appendectomy    TECHNIQUE:  Abdomen and pelvis CT with intravenous contrast.  IV CONTRAST: 75 ml's of Omnipaque  350    COMPARISON:    CT abdomen and pelvis dated 03/09/2023    FINDINGS:  Lung bases: Mild dependent atelectasis    Liver:   Unremarkable.    Gallbladder:   Unremarkable.    Spleen:   Unremarkable.    Pancreas:   Unremarkable.    Adrenals:   Unremarkable.    Kidneys:     Left renal midpole cyst measures 3.2 cm. Normal right kidney.    Bladder:  Unremarkable.    Uterus and Adnexa:  Unremarkable.    Bowel:   Unremarkable.    Appendix:  The appendix is surgically absent.    Lymph nodes:  No suspicious lymph node enlargement.    Vasculature:   Extensive calcified atherosclerosis infrarenal abdominal aorta.     Peritoneum / Retroperitoneum: No ascites.  No free air.    Bones:   Unremarkable.        Impression    NO ACUTE FINDINGS AT THE ABDOMEN OR PELVIS ON CONTRAST-ENHANCED  CT.      Radiologist location ID: [id number]     XR CHEST PA AND LATERAL     Status: None    Narrative    Gwendolin Martis    RADIOLOGIST: Rodgers Norleen Langdon, MD    XR CHEST PA AND LATERAL performed on 06/22/2024 10:07 AM    CLINICAL HISTORY: Hypoxia/Resp Failure  Hypoxia/Resp Failure    TECHNIQUE: Frontal and lateral views of the chest.    COMPARISON:  06/21/2024    FINDINGS:    Heart and mediastinum are stable.    Lungs are stable redemonstrating bibasilar opacities that developed on the previous exam. .    T8 compression fracture similar to previous imaging.      Impression    NO CHANGE FROM THE PREVIOUS STUDY.        Radiologist location ID: [id number]     XR AP MOBILE CHEST     Status: None (Preliminary result)    Narrative  Amerah Counts    RADIOLOGIST: Darryle Mayers    XR AP MOBILE CHEST performed on 06/25/2024 9:37 AM    CLINICAL HISTORY: pneumonia  pneumonia follow up    TECHNIQUE AP upright portable view of the chest.  CT chest is pending today. Last CT chest was 02/14/2024 that study showed no PE, bilateral bronchial wall thickening in bilateral atelectasis.  COMPARISON:  06/22/2024 most recent previous study.    FINDINGS:    Heart size is probably enlarged. There are some right rotation on this study. Mediastinal width appears within normal limits considering rotation. Pulmonary vascularity is mildly prominent.   Bibasilar atelectasis or infiltrates is about the same as 06/22/2024. No new infiltrates are seen.    No large pleural collection is seen.   No acute bony lesion is seen. The previous study showed T8 compression, which is barely visible on this study.        Impression    Cardiomegaly and central pulmonary vascular prominence. Clinical correlation for CHF recommended.    Bilateral basilar atelectasis or infiltrates appears similar to previous.              Radiologist location ID: [id number]     CT CHEST W IV CONTRAST     Status: None    Narrative    Lizvet Peden    RADIOLOGIST: Lynwood LITTIE Shoemaker, MD    CT CHEST W IV CONTRAST performed on 06/25/2024 10:11 AM    CLINICAL HISTORY: dyspnea, hypoxia  dyspnea, hypoxia/smoker many years    TECHNIQUE: Chest CT with intravenous contrast.  CONTRAST: 75 ml's of Omnipaque  350    COMPARISON: 02/14/2024, chest x-ray from 06/25/2024         FINDINGS:  Hardware:  None.    Lymph nodes:   No mediastinal, hilar, or axillary lymphadenopathy.    Heart and Vasculature:  The heart is enlarged with coronary artery atherosclerotic changes. Atherosclerotic calcifications of the thoracic aorta.  Pulmonary arteries are unremarkable.    Lungs and Airways:  Moderate emphysematous changes. Scarring at the apices. Mild Consolidative change posteriorly in both lower lobes could be atelectasis or pneumonia.    Pleura: Small bilateral effusions left greater than right. There is no visible pneumothorax.    Upper Abdomen: Left renal cyst.    Bones: Old T8 compression fracture.        Impression    Small bilateral effusions left greater than right with consolidative change posteriorly in both lower lobes could be atelectasis or pneumonia.          Radiologist location ID: [id number]               Assessment/Plan:    Anemia    Continue current management and supportive care. Continue to monitor H&H and for signs GI bleeding. Hgb 7.9. EGD yesterday showed moderate distal esophagitis, LA grade A; small hiatal hernia, mild gastritis, no ulcers of bleeding seen. Continue Protonix  40 mg daily before dinner. Elective colonoscopy as an outpt after pneumonia resolves.   Will continue to follow. Further recommendations based on clinical course. Patient discussed in detail with Dr MARLA Blanch and treatment plan decided by him.     Suzen Jenkins Signs, APRN, CNP        [1]   Allergies  Allergen Reactions    Hydrocodone  Nausea/ Vomiting

## 2024-06-25 NOTE — Respiratory Therapy (Signed)
 06/25/24 2239   Non-Invasive Ventilation Assessment   Start Time 2239   Orders Updated in EMR Yes   $ NIV NIV - subsequent   NIV Status Placed On Bipap   $ O2 Delivery BP   Oxygen  Concentration (%) 35   Circuit Checked   Proximal  Temperature 35 C (95 F)   H2O Bag Checked   Hepa Filter Checked   Non-Invasive Ventilation Check   Type of Mask FFM   NG/OG Placed No   Facial Skin Integrity WNL   Device Hamilton C1    Mode NIV-ST   Spontaneous Volume 556 mL   Set Rate 5   Spontaneous Rate 26 Breaths Per Minute   Minute Volume 15.8 LPM   Pressure Support (Above PEEP) 10 cmH2O   PEEP/CPAP (cm/H2O) 10 CM H20   IPAP 20 cmH20   EPAP 10 cmH2O   PIP 24 cmH2O   I-Time 0.5 seconds   SpO2 96 %   Alarms   Audible Alarms Checked & Functioning   Hi Rate 40   Lo Rate 5   Hi Pressure 40 cmH2O   Lo Pressure 5 cmH2O   Leak 17 mL   Apnea Alarm 20 Seconds   Low Min Ventilation 4   Timepoint   Stop Time 2246   Check Duration 7 minutes     Patient was placed on BiPAP.

## 2024-06-25 NOTE — Respiratory Therapy (Signed)
 06/25/24 0903   High Flow Nasal Cannula   Start Time 0807   Equipment Adult   HFNC Device Hamilton C1   HFNC Status Placed On HFNC   Facial Skin Integrity WNL   H2O Bottle Check (HFNC) Checked   Circuit Checked   Temperature 34.6 C (94.3 F)   Flow  45 LPM   FiO2 40 %   SpO2 95   $HFNC Subsequent Charge (Resp only) HFNC-Sub   Stop Time 0810   Duration 3 Minutes     Pt taken off BiPAP and placed on HFNC to eat. Pt tolerating well. Care ongoing

## 2024-06-25 NOTE — Respiratory Therapy (Signed)
 06/25/24 0511   Non-Invasive Ventilation Assessment   Start Time 0511   Orders Updated in EMR Yes   $ NIV NIV - subsequent   NIV Status Currently On   $ O2 Delivery BP   Oxygen  Concentration (%) 35   Circuit Checked   Hepa Filter Checked   Non-Invasive Ventilation Check   Type of Mask FFM   NG/OG Placed No   Facial Skin Integrity WNL   Device Hamilton C1    Mode NIV-ST   Spontaneous Volume 438 mL   Set Rate 5   Spontaneous Rate 18 Breaths Per Minute   Minute Volume 11.6 LPM   FiO2 Set 35%   Pressure Support (Above PEEP) 10 cmH2O   PEEP/CPAP (cm/H2O) 10 CM H20   IPAP 20 cmH20   EPAP 10 cmH2O   I-Time 0.5 seconds   SpO2 96 %   Alarms   Audible Alarms Checked & Functioning   Hi Rate 40   Lo Rate 5   Hi Pressure 40 cmH2O   Lo Pressure 5 cmH2O   Low Volume 100 m L   Leak 43 mL   Apnea Alarm 20 Seconds   Low Min Ventilation 4   Timepoint   Stop Time 0514   Check Duration 3 minutes     Patient is sleeping on Biapap machine.

## 2024-06-25 NOTE — Nurses Notes (Signed)
 Patient pulled out IV. Catheter intact. Misty Johnson attempted to restart IV x2 and patient became upset, trying to get oob and stating that she is going home if we keep trying to put in an IV.  Attempts to restart IV stopped and patient is currently talking on the phone with her daughter.

## 2024-06-26 DIAGNOSIS — I517 Cardiomegaly: Secondary | ICD-10-CM

## 2024-06-26 DIAGNOSIS — J90 Pleural effusion, not elsewhere classified: Secondary | ICD-10-CM

## 2024-06-26 LAB — CBC WITH DIFF
BASOPHIL #: 0 x10ˆ3/uL (ref 0.00–0.10)
BASOPHIL %: 1 % (ref 0–1)
EOSINOPHIL #: 0 x10ˆ3/uL (ref 0.00–0.50)
EOSINOPHIL %: 1 % (ref 1–7)
HCT: 23.5 % — ABNORMAL LOW (ref 31.2–41.9)
HGB: 7.4 g/dL — ABNORMAL LOW (ref 10.9–14.3)
LYMPHOCYTE #: 0.9 x10ˆ3/uL — ABNORMAL LOW (ref 1.10–3.10)
LYMPHOCYTE %: 17 % (ref 16–46)
MCH: 25.3 pg (ref 24.7–32.8)
MCHC: 31.7 g/dL — ABNORMAL LOW (ref 32.3–35.6)
MCV: 80 fL (ref 75.5–95.3)
MONOCYTE #: 0.6 x10ˆ3/uL (ref 0.20–0.90)
MONOCYTE %: 11 % (ref 4–11)
MPV: 9.6 fL (ref 7.9–10.8)
NEUTROPHIL #: 3.8 x10ˆ3/uL (ref 1.90–8.20)
NEUTROPHIL %: 71 % (ref 43–77)
PLATELETS: 112 x10ˆ3/uL — ABNORMAL LOW (ref 140–440)
RBC: 2.93 x10ˆ6/uL — ABNORMAL LOW (ref 3.63–4.92)
RDW: 18.2 % — ABNORMAL HIGH (ref 12.3–17.7)
WBC: 5.3 x10ˆ3/uL (ref 3.8–11.8)

## 2024-06-26 LAB — BASIC METABOLIC PANEL
ANION GAP: 4 mmol/L (ref 4–13)
BUN/CREA RATIO: 31 — ABNORMAL HIGH (ref 6–22)
BUN: 21 mg/dL (ref 7–25)
CALCIUM: 8.4 mg/dL — ABNORMAL LOW (ref 8.6–10.3)
CHLORIDE: 107 mmol/L (ref 98–107)
CO2 TOTAL: 32 mmol/L — ABNORMAL HIGH (ref 21–31)
CREATININE: 0.68 mg/dL (ref 0.60–1.30)
ESTIMATED GFR: 91 mL/min/1.73mˆ2 (ref >59)
GLUCOSE: 93 mg/dL (ref 74–109)
OSMOLALITY, CALCULATED: 288 mosm/kg (ref 270–290)
POTASSIUM: 4 mmol/L (ref 3.5–5.1)
SODIUM: 143 mmol/L (ref 136–145)

## 2024-06-26 LAB — ADULT ROUTINE BLOOD CULTURE, SET OF 2 BOTTLES (BACTERIA AND YEAST)
BLOOD CULTURE, ROUTINE: NO GROWTH
BLOOD CULTURE, ROUTINE: NO GROWTH
BLOOD CULTURE, ROUTINE: NO GROWTH
BLOOD CULTURE, ROUTINE: NO GROWTH

## 2024-06-26 LAB — MAGNESIUM: MAGNESIUM: 1.9 mg/dL (ref 1.9–2.7)

## 2024-06-26 MED ORDER — METHYLPREDNISOLONE SOD SUCCINATE 40 MG/ML SOLUTION FOR INJ. WRAPPER
40.0000 mg | Freq: Every day | INTRAMUSCULAR | Status: DC
  Administered 2024-06-26 – 2024-06-29 (×4): 40 mg via INTRAVENOUS
  Filled 2024-06-26 (×5): qty 1

## 2024-06-26 MED ORDER — SODIUM CHLORIDE 0.9 % INTRAVENOUS SOLUTION
200.0000 mg | Freq: Every day | INTRAVENOUS | Status: AC
  Administered 2024-06-26: 0 mg via INTRAVENOUS
  Administered 2024-06-26: 200 mg via INTRAVENOUS
  Administered 2024-06-27: 0 mg via INTRAVENOUS
  Administered 2024-06-27: 200 mg via INTRAVENOUS
  Filled 2024-06-26 (×3): qty 10

## 2024-06-26 MED ORDER — DIAZEPAM 5 MG/ML INJECTION SYRINGE
5.0000 mg | INJECTION | Freq: Once | INTRAMUSCULAR | Status: AC
  Administered 2024-06-26: 5 mg via INTRAVENOUS
  Filled 2024-06-26: qty 2

## 2024-06-26 NOTE — Respiratory Therapy (Signed)
 Decreased FIO2 to 40%, patient tolerating well with a saturation of 97% at this time. Will continue to monitor.     06/26/24 1324   Breath Sounds   Throughout All Lung Fields All Fields   All Lung Fields Breath Sounds Anterior:;diminished   Aerosol Therapy (SVN)   Start Time 1322   Treatment Status Given   Respiratory Treatment Status (SVN) given   Route (Aerosol Therapy) aerogen   $ Respiratory Treatment (Resp only) Neb (Sub)   Medications DuoNeb (albuterol -atrovent);Mucomyst  (Acetylcysteine )   Patient Position Sitting   Respiratory Pre/Post-Treatment Assess   Pre-Treatment Heart Rate (beats/min) 63   Pre-Treatment Resp Rate (breaths/min) 28   Device (Oxygen  Therapy) heated;high-flow nasal cannula;humidified   Flow (L/min) (Oxygen  Therapy) 45   Oxygen  Concentration (%) 40   High Flow Nasal Cannula   Start Time 1325   Order Updated in EMR Yes   Equipment Adult   HFNC Device Hamilton C1   HFNC Status Currently On   Facial Skin Integrity WNL   H2O Bottle Check (HFNC) Checked   Circuit Checked   Temperature 34.6 C (94.3 F)   Flow  45 LPM   FiO2 40 %   SpO2 97   Stop Time 1327   Duration 2 Minutes

## 2024-06-26 NOTE — Nurses Notes (Signed)
 Patient sleeping. BiPAP in use. No distress observed. Bed alarm in use.

## 2024-06-26 NOTE — Respiratory Therapy (Addendum)
 06/26/24 0000   Non-Invasive Ventilation Assessment   Start Time 0004   Orders Updated in EMR Yes   $ NIV NIV - subsequent   NIV Status Patient Refused   (Patient refused to keep Bipap on.)     Patient refused to keep BiPAP mask on.  Placed on Oxymask.  RN notified.

## 2024-06-26 NOTE — Respiratory Therapy (Addendum)
 Patient slept wearing BiPAP machine.

## 2024-06-26 NOTE — Respiratory Therapy (Signed)
 06/26/24 0118   Non-Invasive Ventilation Assessment   Start Time 0110   Orders Updated in EMR Yes   $ NIV NIV - subsequent   NIV Status Placed On Bipap   $ O2 Delivery BP   Oxygen  Concentration (%) 35   Circuit Checked   Proximal  Temperature 35 C (95 F)   H2O Bag Checked   Hepa Filter Checked   Non-Invasive Ventilation Check   Type of Mask FFM   NG/OG Placed No   Facial Skin Integrity WNL   Device Hamilton C1    Mode NIV-ST   Spontaneous Volume 539 mL   Set Rate 5   Spontaneous Rate 19 Breaths Per Minute   Minute Volume 10.9 LPM   FiO2 Set 35%   Pressure Support (Above PEEP) 10 cmH2O   PEEP/CPAP (cm/H2O) 10 CM H20   IPAP 20 cmH20   EPAP 10 cmH2O   PIP 22 cmH2O   I-Time 0.5 seconds   SpO2 94 %   Alarms   Audible Alarms Checked & Functioning   Hi Rate 40   Lo Rate 5   Hi Pressure 40 cmH2O   Lo Pressure 5 cmH2O   Low Volume 100 m L   Leak 35 mL   Apnea Alarm 20 Seconds   Low Min Ventilation 4   Timepoint   Stop Time 0121   Check Duration 11 minutes     Patient placed back on BiPAP after desaturation while being cleaned. Patient was given valium ,  RN is by the bed side to sit with them to keep mask on.

## 2024-06-26 NOTE — Respiratory Therapy (Signed)
 Nurse called that patient's saturation was in the 80's. Upon arrival to the room, patient had been placed on a 4 L/M oxymask on top of HFNC. I removed the oxymask & increased the FIO2 to 45%. Patient's saturation is currently 96%.       06/26/24 0832   High Flow Nasal Cannula   Start Time 0828   Order Updated in EMR Yes   Equipment Adult   HFNC Device Hamilton C1   HFNC Status Currently On   Facial Skin Integrity WNL   H2O Bottle Check (HFNC) Checked   Circuit Checked   Temperature 34.5 C (94.1 F)   Flow  45 LPM   FiO2 45 %   SpO2 95

## 2024-06-26 NOTE — Respiratory Therapy (Signed)
 06/26/24 2129   Respiratory   Respiratory WDL WDL   Additional Documentation [facility];Accupap   Cough Frequency infrequent   Cough Type congested   Rhythm/Pattern, Respiratory unlabored   Breath Sounds   L General Breath Sounds Diminished   R General Breath Sounds Diminished   High Flow Nasal Cannula   Start Time 2131   Order Updated in EMR Yes   Equipment Adult   HFNC Device Hamilton C1   HFNC Status Currently On   Facial Skin Integrity WNL   H2O Bottle Check (HFNC) Checked   Circuit Checked   Temperature 34 C (93.2 F)   Flow  45 LPM   FiO2 40 %   SpO2 99   $HFNC Subsequent Charge (Resp only) HFNC-Sub   Stop Time 2132   Duration 1 Minutes   Aerosol Therapy (SVN)   Start Time 2129   Treatment Status Given   Daily Review of Necessity (SVN) completed   Respiratory Treatment Status (SVN) given   Route (Aerosol Therapy) aerogen   $ Respiratory Treatment (Resp only) Neb (Sub)   Medications DuoNeb (albuterol -atrovent);Mucomyst  (Acetylcysteine )   Patient Position Sitting   Posttreatment Assessment (SVN) increased aeration   Signs of Intolerance (SVN) none   Stop Time 2140   Duration (minutes) 11 minutes   Respiratory Pre/Post-Treatment Assess   Pre-Treatment Heart Rate (beats/min) 74   Pre-Treatment Resp Rate (breaths/min) 17   Post-treatment Heart Rate (beats/min) 74   Post-treatment Resp Rate (breaths/min) 17   Device (Oxygen  Therapy) high-flow nasal cannula;heated   Flow (L/min) (Oxygen  Therapy) 45   Oxygen  Concentration (%) 40   AccuPAP   Start Time 2140   $AccuPAP S   Status  G   Review of Necessity  completed   With Medication? No   Route  mouth piece   Breaths  20 breaths   Patient Position Sitting in a chair   Posttreatment Assessment  increased aeration   Signs of Intolerance  none   Stop Time 2145   Duration (minutes) 5 minutes     Pt tolerated treatment and therapy well

## 2024-06-26 NOTE — Progress Notes (Signed)
 [place] MEDICINE [facility]    HOSPITALIST PROGRESS NOTE    Misty Johnson  Date of service: 06/26/2024  Date of Admission:  06/21/2024  Hospital Day:  LOS: 5 days     Interval History:   Follow up on 06/26/2024:  Patient is sitting up in the bed, sipping on some coffee.  She is on high-flow nasal cannula.  Used BiPAP last night.  Still having moderate dyspnea with any exertion.  Reports occasional cough.      Filed Vitals:    06/26/24 0707 06/26/24 0708 06/26/24 0741 06/26/24 0838   BP:    (!) 147/49   Pulse: 81 80 72 (!) 101   Resp:       Temp:       SpO2:           Intake/Output Summary (Last 24 hours) at 06/26/2024 0953  Last data filed at 06/26/2024 0610  Gross per 24 hour   Intake 1008 ml   Output --   Net 1008 ml       Start Time: 1715  $ O2 Delivery: BiPAP  Flow (L/min) (RT): 35  FiO2 (RT): 48 %  SpO2: 96 %         Scheduled Medication PRN Medications     acetylcysteine  (MUCOMYST ) 20% nebulized solution, 4 mL, 3x/day    ampicillin -sulbactam (UNASYN ) 3 g in NS 100 mL IVPB with adaptor, 3 g, Q6H    atorvastatin  (LIPITOR) tablet, 40 mg, QPM    doxycycline  tablet, 100 mg, 2x/day    enoxaparin  PF (LOVENOX ) 40 mg/0.4 mL SubQ injection, 40 mg, Q24H    escitalopram  (LEXAPRO ) tablet, 20 mg, Daily    [Held by provider] furosemide  (LASIX ) tablet, 40 mg, Daily    guaiFENesin  (MUCINEX ) extended release tablet - for cough (expectorant), 600 mg, 2x/day    ipratropium-albuterol  0.5 mg-3 mg(2.5 mg base)/3 mL Solution for Nebulization, 3 mL, Q4H    iron  sucrose (VENOFER ) 200 mg in NS 100 mL IVPB, 200 mg, Daily    methylPREDNISolone  sod succ (SOLU-medrol ) 40 mg/mL injection, 40 mg, Daily    metoprolol  succinate (TOPROL -XL) 24 hr extended release tablet, 25 mg, Daily    NS flush syringe, 3 mL, Q8HRS    pantoprazole  (PROTONIX ) delayed release tablet, 40 mg, Daily    ramelteon  (ROZEREM ) tablet, 8 mg, NIGHTLY    rOPINIRole  (REQUIP ) tablet, 0.25 mg, QPM    acetaminophen  (TYLENOL ) tablet, 650 mg, Q6H PRN    NS 250 mL  flush bag, , Q15 Min PRN **AND** D5W 250 mL flush bag, , Q15 Min PRN **AND** Small Volume Medication Infusion Line Flush, , UNTIL DISCONTINUED    NS 250 mL flush bag, , Q15 Min PRN **AND** D5W 250 mL flush bag, , Q15 Min PRN **AND** Small Volume Medication Infusion Line Flush, , UNTIL DISCONTINUED    NS 250 mL flush bag, , Q15 Min PRN **AND** D5W 250 mL flush bag, , Q15 Min PRN **AND** Small Volume Medication Infusion Line Flush, , UNTIL DISCONTINUED    NS 250 mL flush bag, , Q15 Min PRN **AND** D5W 250 mL flush bag, , Q15 Min PRN **AND** Small Volume Medication Infusion Line Flush, , UNTIL DISCONTINUED    ipratropium-albuterol  0.5 mg-3 mg(2.5 mg base)/3 mL Solution for Nebulization, 3 mL, Q4H PRN    sennosides-docusate sodium  (SENOKOT-S) 8.6-50mg  per tablet, 1 Tablet, 2x/day PRN      Insulin Sliding Scale Requirement:  Antihyperglycemics (last 24 hours)       None  Labs:  CBC RESULTS CMP RESULTS   Recent Labs     06/24/24  0356 06/25/24  0435 06/26/24  0424   WBC 6.0 5.1 5.3   RBC 3.31* 3.15* 2.93*   HGB 8.4* 7.9* 7.4*   HCT 26.8* 26.3* 23.5*   MCV 81.1 83.4 80.0   MCH 25.2 25.1 25.3   MCHC 31.1* 30.1* 31.7*   RDW 18.8* 19.3* 18.2*   PLTCNT 114* 128* 112*   MPV 10.1 10.4 9.6     Recent Labs     06/24/24  0356 06/25/24  0258 06/25/24  0435 06/25/24  0530 06/26/24  0424   SODIUM 143 137 140 137 143   POTASSIUM 4.1  --  4.7  --  4.0   CHLORIDE 108* 104 106 106 107   CO2 31  --  28  --  32*   ANIONGAP 4  --  6  --  4   BUN 18  --  19  --  21   CREATININE 0.61  --  0.58*  --  0.68   BUNCRRATIO 30*  --  33*  --  31*   GFR 93  --  94  --  91   ALBUMIN 3.5  --  3.4*  --   --    CALCIUM  8.6  --  8.4*  --  8.4*   GLUCOSENF 99  --  119*  --  93   ALKPHOS 47  --  47  --   --    ALT 6*  --  5*  --   --    AST 16  --  12*  --   --    TOTBILIRUBIN 0.6  --  0.7  --   --    TOTALPROTEIN 5.5*  --  5.3*  --   --    ALBGLOB 1.8*  --  1.8*  --   --    OSMBLD 287  --  283  --  288   CORCA 9.0  --  8.9  --   --    GLOBULIN 2.0   --  1.9*  --   --       OTHER CHEMISTRIES RESULTS CREATININE CLEARENCE   Recent Labs     06/24/24  0356 06/25/24  0435 06/26/24  0424   MAGNESIUM  1.9 2.0 1.9    Renal Creatinine (72h ago through now)       Date/Time Serum Creatinine Range CrCl    06/26/24 0424 0.68 mg/dL   9.39 - 8.69 mg/dL 28.6 mL/min    87/76/74 0435 0.58 mg/dL   9.39 - 8.69 mg/dL 16.3 mL/min    87/77/74 0356 0.61 mg/dL   9.39 - 8.69 mg/dL 20.4 mL/min            COAG RESULTS CARDIAC MARKERS   No results for input(s): APTT, PROTHROMTME, INR in the last 72 hours.  No results for input(s): TROPONINI, BNP, NTPROBNP, LACTICACID, LIPASE in the last 72 hours.     HA1C TSH LIPID PANEL   No results found for: HA1C   No results found for: TSH   No results found for: CHOLESTEROL, TRIG, HDLCHOL, LDLCHOL, VLDLCAL, CHOLHDLRATIO       Assessment & Plan:    Bilateral pneumonia, suspected Gram-positive  Acute on chronic hypoxic respiratory failure  -CXR showing bibasilar atelectasis vs PNA   -titrate O2 to maintain O2 sat 89-92%  -neb treatments as needed   -incentive spirometry   -continue unasyn  and  doxycycline    -mucinex    - On high-flow nasal cannula  - Chest CT showed small pleural effusions, consolidation  - Solu-Medrol  40 mg IV daily        Melena   Chronic iron  deficiency anemia   -stool for occult blood +  -monitor Hgb   -iron  studies reviewed   -Venofer  x 3 doses completed.  Give 2 more doses.  - GI following, EGD completed yesterday.  Moderate distal esophagitis, LA grade a, small hiatal hernia, mild gastritis, no ulcers or bleeding seen.  Continue Protonix  40 mg daily.  Elective colonoscopy as an outpatient after pneumonia resolves.     Depression   - Resume Lexapro      History of CHF though last two echos normal   -euvolemic at this time       Disposition Planning:  Skilled nursing facility      IVF:   Current Facility-Administered Medications   Medication Dose Frequency Last Rate     GI Prophylaxis :  Pantoprazole   Bowel Regimen:  Senokot  Date of Last Bowel Movement: 06/22/24  DVT/PE Prophylaxis :    Daily Orders (From admission, onward)       Start     Ordered    06/21/24 1900  enoxaparin  PF (LOVENOX ) 40 mg/0.4 mL SubQ injection  (MEDIUM RISK VTE LEVEL)  EVERY 24 HOURS        Question:  Select Indication of Use  Answer:  VTE Prophylaxis    06/21/24 1838    06/21/24 1845  PT IS INTERMEDIATE RISK FOR VENOUS THROMBOEMBOLISM  (MEDIUM RISK VTE LEVEL)  CONTINUOUS        References:    VTE RISK ASSESSMENT TOOL    06/21/24 1838                   Anticoagulants (last 24 hours)       Date/Time Action Medication Dose    06/25/24 1811 Given    enoxaparin  PF (LOVENOX ) 40 mg/0.4 mL SubQ injection 40 mg           Lines:   Patient Lines/Drains/Airways Status       Active Line / Dialysis Catheter / Dialysis Graft / Drain / Airway / Wound       Name Placement date Placement time Site Days    Peripheral IV Distal;Left Basilic  (medial side of arm) 06/26/24  0109  -- less than 1                   Diet: Nutrition:    DIET CARDIAC (2G NA, LOWFAT, LOW CHOL) Do you want to initiate MNT Protocol? Yes; Additional modifications/limitations: GI/SOFT    Additional clinical characteristics related to nutrition:    - monitor for weight changes   - monitor intake and output    - monitor bowel functions                 On the day of the encounter, a total of 25 minutes was spent on this patient encounter including review of historical information, examination, documentation and post-visit activities. The time documented excludes procedural time and time spent on wellness portion of the visit.     Powell Corona, APRN, CNP  06/26/2024  [place] MEDICINE HOSPITALIST

## 2024-06-26 NOTE — Respiratory Therapy (Signed)
 06/26/24 0037   High Flow Nasal Cannula   Start Time 0034   Order Updated in EMR Yes   Equipment Adult   HFNC Device [place] C1   HFNC Status Placed On HFNC   Facial Skin Integrity WNL   H2O Bottle Check (HFNC) Checked   Circuit Checked   Temperature 35 C (95 F)   Flow  45 LPM   FiO2 35 %   SpO2 92   $HFNC Subsequent Charge (Resp only) HFNC-Sub   $Type of Circuit Changed (Resp only) HFNC   Respiratory Pre/Post-Treatment Assess   Pre-Treatment Heart Rate (beats/min) 66   Pre-Treatment Resp Rate (breaths/min) 18   Post-treatment Heart Rate (beats/min) 64   Post-treatment Resp Rate (breaths/min) 18     Patient placed on HFNC, to maintain flow to keep CO2 down, patient refused to wear BiPAP.

## 2024-06-26 NOTE — Respiratory Therapy (Signed)
 I offered to place patient on BiPAP d/t increased shortness of breath & low saturation but patient refused at this time. I will continue to monitor.

## 2024-06-26 NOTE — PT Treatment (Signed)
 [facility] Medicine [facility]  [address]  [place], [address]  [phone]  (Fax) [phone]  Rehabilitation Department  Physical Therapy Daily Inpatient Note    Date: 06/26/2024  Patient's Name: Misty Johnson  Date of Birth: 26-Apr-1951  Height: Height: 162.6 cm (5' 4.02)  Weight: Weight: 76 kg (167 lb 9 oz)      Plan: Will continue under current POC.      Discharge Disposition: (P) inpatient rehabilitation facility, TBD        Subjective/Objective/Assessment:  Flowsheet    06/26/24 1037   Rehab Session   Document Type therapy progress note (daily note)   PT Visit Date 06/26/24   General Information   Patient Profile Reviewed yes   Medical Lines Telemetry;PIV Line   Respiratory Status high flow nasal cannula   Existing Precautions/Restrictions fall precautions;full code   Pre Treatment Status   Pre Treatment Patient Status Nurse approved session;Patient supine in bed   Pre- Treatment Vital Signs   Pre-Treatment Heart Rate (beats/min) 70   Pre SpO2 (%) 96   O2 Delivery Pre Treatment supplemental O2   Vitals Comment HFNC   Bed Mobility   Supine-Sit [place] stand-by assistance   Bed Mobility, Assistive Device bed rails   Transfer Assessment/Treatment   Sit-Stand [place] contact guard assist   Stand-Sit [place] contact guard assist   Sit-Stand-Sit, Assist Device walker, front wheeled   Transfer Impairments endurance;strength decreased   Gait Assessment/Treatment   Total Distance Ambulated 2   [place]ependence  contact guard assist   Assistive Device  walker, front wheeled   Impairments  endurance;strength decreased   Comment distance limited by tethered to airvo   Post Treatment Status   Post Treatment Patient Status Patient sitting in bedside chair or w/c;Call light within reach;Telephone within reach;Patient safety alarm activated   Patient Effort adequate   Post-Treatment Vital Signs   Post-treatment Heart Rate (beats/min) 73   Post SpO2 (%) 97   O2 Delivery Post Treatment  supplemental O2   Cognitive Assessment/Intervention   Behavior/Mood Observations behavior appropriate to situation, WNL/WFL   Physical Therapy Time and Intention   Total PT Minutes: 9   Therapy Plan Review/Discharge Plan (PT)   Anticipated Discharge Disposition inpatient rehabilitation facility;TBD     Pt tolerated mobility tasks fairly well on HFNC. Verbal cues given for safety and sequence. Pt is left up in vascular chair and left with needs in reach and chair alarmed.           Goals:     all bed mobility activities  stand-by assistance  bed rails    stand-by assistance  walker, rolling, least restricted assistive device  100 feet  portable O2 and chair to follow as needed    Patient will achieve strength BLE to 4+/5 to aid transfers and gait.    sit-to-stand/stand-to-sit, bed-to-chair/chair-to-bed, toilet  minimum assist (75% patient effort)  walker, rolling, least restrictive assistive device                     Intervention minutes: THERAPEUTIC ACTIVITY 9 minutes    THERAPIST  Natalin Bible, PTA  06/26/2024, 14:15

## 2024-06-26 NOTE — Respiratory Therapy (Signed)
 Patient was placed back on BiPAP after desaturating while being cleaned up and set up in bed.  RN is sitting at bedside because patient is confused and not wanting to keep BiPAP mask on.

## 2024-06-26 NOTE — Respiratory Therapy (Signed)
 Patient refused to keep BiPAP mask on.  Placed patient on Oxy mask.  RN notified.

## 2024-06-26 NOTE — Nurses Notes (Signed)
 Patient pulled out another IV. Refusing to wear BIPAP is restless and agitated.  MD notified.

## 2024-06-26 NOTE — Care Plan (Signed)
 Problem: Adult Inpatient Plan of Care  Goal: Absence of Hospital-Acquired Illness or Injury  06/26/2024 1551 by Fonda BRAVO, RN  Outcome: Ongoing (see interventions/notes)  06/26/2024 1035 by Fonda BRAVO, RN  Outcome: Ongoing (see interventions/notes)  Intervention: Identify and Manage Fall Risk  Recent Flowsheet Documentation  Taken 06/26/2024 1200 by Fonda BRAVO, RN  Safety Promotion/Fall Prevention:   activity supervised   nonskid shoes/slippers when out of bed   safety round/check completed   fall prevention program maintained  Taken 06/26/2024 1000 by Fonda BRAVO, RN  Safety Promotion/Fall Prevention:   activity supervised   nonskid shoes/slippers when out of bed   safety round/check completed   fall prevention program maintained  Taken 06/26/2024 0800 by Fonda BRAVO, RN  Safety Promotion/Fall Prevention:   activity supervised   nonskid shoes/slippers when out of bed   safety round/check completed   fall prevention program maintained  Intervention: Prevent Skin Injury  Recent Flowsheet Documentation  Taken 06/26/2024 1000 by Fonda BRAVO, RN  Body Position: supine, head elevated  Taken 06/26/2024 0800 by Fonda BRAVO, RN  Body Position: supine, head elevated  Skin Protection: adhesive use limited  Intervention: Prevent and Manage VTE (Venous Thromboembolism) Risk  Recent Flowsheet Documentation  Taken 06/26/2024 1200 by Fonda BRAVO, RN  VTE Prevention/Management: anticoagulant therapy maintained  Taken 06/26/2024 1000 by Fonda BRAVO, RN  VTE Prevention/Management: anticoagulant therapy maintained  Taken 06/26/2024 0800 by Fonda BRAVO, RN  VTE Prevention/Management: anticoagulant therapy maintained  Goal: Optimal Comfort and Wellbeing  06/26/2024 1551 by Fonda BRAVO, RN  Outcome: Ongoing (see interventions/notes)  06/26/2024 1035 by Fonda BRAVO, RN  Outcome: Ongoing (see interventions/notes)  Goal: Rounds/Family Conference  06/26/2024 1551 by Fonda BRAVO, RN  Outcome: Ongoing (see interventions/notes)  06/26/2024 1035 by Fonda BRAVO, RN  Outcome:  Ongoing (see interventions/notes)     Problem: Skin Injury Risk Increased  Goal: Skin Health and Integrity  06/26/2024 1551 by Fonda BRAVO, RN  Outcome: Ongoing (see interventions/notes)  06/26/2024 1035 by Fonda BRAVO, RN  Outcome: Ongoing (see interventions/notes)  Intervention: Optimize Skin Protection  Recent Flowsheet Documentation  Taken 06/26/2024 1200 by Fonda BRAVO, RN  Pressure Reduction Techniques:   Frequent weight shifting encouraged   Supplemented with small shifts  Pressure Reduction Devices: Repositioning wedges/pillows utilized  Activity Management: ROM, active encouraged  Taken 06/26/2024 1000 by Fonda BRAVO, RN  Pressure Reduction Techniques:   Frequent weight shifting encouraged   Supplemented with small shifts  Pressure Reduction Devices: Repositioning wedges/pillows utilized  Activity Management: ROM, active encouraged  Head of Bed (HOB) Positioning: HOB at 20-30 degrees  Taken 06/26/2024 0800 by Fonda BRAVO, RN  Pressure Reduction Techniques:   Frequent weight shifting encouraged   Supplemented with small shifts  Pressure Reduction Devices: Repositioning wedges/pillows utilized  Skin Protection: adhesive use limited  Activity Management: ROM, active encouraged  Head of Bed (HOB) Positioning: HOB at 20-30 degrees     Problem: Pneumonia  Goal: Fluid Balance  06/26/2024 1551 by Fonda BRAVO, RN  Outcome: Ongoing (see interventions/notes)  06/26/2024 1035 by Fonda BRAVO, RN  Outcome: Ongoing (see interventions/notes)  Goal: Absence of Infection Signs and Symptoms  06/26/2024 1551 by Fonda BRAVO, RN  Outcome: Ongoing (see interventions/notes)  06/26/2024 1035 by Fonda BRAVO, RN  Outcome: Ongoing (see interventions/notes)  Goal: Effective Oxygenation and Ventilation  06/26/2024 1551 by Fonda BRAVO, RN  Outcome: Ongoing (see interventions/notes)  06/26/2024 1035 by Fonda BRAVO, RN  Outcome: Ongoing (see interventions/notes)  Intervention: Optimize Oxygenation and Ventilation  Recent  Flowsheet Documentation  Taken 06/26/2024 1000 by  Fonda BRAVO, RN  Head of Bed [facility]) Positioning: HOB at 20-30 degrees  Taken 06/26/2024 0800 by Fonda BRAVO, RN  Head of Bed [facility]) Positioning: HOB at 20-30 degrees     Problem: Fall Injury Risk  Goal: Absence of Fall and Fall-Related Injury  06/26/2024 1551 by Fonda BRAVO, RN  Outcome: Ongoing (see interventions/notes)  06/26/2024 1035 by Fonda BRAVO, RN  Outcome: Ongoing (see interventions/notes)  Intervention: Promote Injury-Free Environment  Recent Flowsheet Documentation  Taken 06/26/2024 1200 by Fonda BRAVO, RN  Safety Promotion/Fall Prevention:   activity supervised   nonskid shoes/slippers when out of bed   safety round/check completed   fall prevention program maintained  Taken 06/26/2024 1000 by Fonda BRAVO, RN  Safety Promotion/Fall Prevention:   activity supervised   nonskid shoes/slippers when out of bed   safety round/check completed   fall prevention program maintained  Taken 06/26/2024 0800 by Fonda BRAVO, RN  Safety Promotion/Fall Prevention:   activity supervised   nonskid shoes/slippers when out of bed   safety round/check completed   fall prevention program maintained     Problem: Health Knowledge, Opportunity to Enhance (Adult,Obstetrics,Pediatric)  Goal: Knowledgeable about Health Subject/Topic  Description: Patient will demonstrate the desired outcomes by discharge/transition of care.  06/26/2024 1551 by Fonda BRAVO, RN  Outcome: Ongoing (see interventions/notes)  06/26/2024 1035 by Fonda BRAVO, RN  Outcome: Ongoing (see interventions/notes)     Problem: Gas Exchange Impaired  Goal: Optimal Gas Exchange  06/26/2024 1551 by Fonda BRAVO, RN  Outcome: Ongoing (see interventions/notes)  06/26/2024 1035 by Fonda BRAVO, RN  Outcome: Ongoing (see interventions/notes)  Intervention: Optimize Oxygenation and Ventilation  Recent Flowsheet Documentation  Taken 06/26/2024 1000 by Fonda BRAVO, RN  Head of Bed [facility]) Positioning: HOB at 20-30 degrees  Taken 06/26/2024 0800 by Fonda BRAVO, RN  Head of Bed [facility]) Positioning: HOB at 20-30 degrees

## 2024-06-26 NOTE — Care Plan (Signed)
 Problem: Adult Inpatient Plan of Care  Goal: Absence of Hospital-Acquired Illness or Injury  Outcome: Ongoing (see interventions/notes)  Intervention: Identify and Manage Fall Risk  Recent Flowsheet Documentation  Taken 06/26/2024 0800 by Fonda BRAVO, RN  Safety Promotion/Fall Prevention:   activity supervised   nonskid shoes/slippers when out of bed   safety round/check completed   fall prevention program maintained  Intervention: Prevent Skin Injury  Recent Flowsheet Documentation  Taken 06/26/2024 0800 by Fonda BRAVO, RN  Body Position: supine, head elevated  Skin Protection: adhesive use limited  Intervention: Prevent and Manage VTE (Venous Thromboembolism) Risk  Recent Flowsheet Documentation  Taken 06/26/2024 0800 by Fonda BRAVO, RN  VTE Prevention/Management: anticoagulant therapy maintained  Goal: Optimal Comfort and Wellbeing  Outcome: Ongoing (see interventions/notes)  Goal: Rounds/Family Conference  Outcome: Ongoing (see interventions/notes)     Problem: Skin Injury Risk Increased  Goal: Skin Health and Integrity  Outcome: Ongoing (see interventions/notes)  Intervention: Optimize Skin Protection  Recent Flowsheet Documentation  Taken 06/26/2024 0800 by Fonda BRAVO, RN  Pressure Reduction Techniques:   Frequent weight shifting encouraged   Supplemented with small shifts  Pressure Reduction Devices: Repositioning wedges/pillows utilized  Skin Protection: adhesive use limited  Activity Management: ROM, active encouraged  Head of Bed (HOB) Positioning: HOB at 20-30 degrees     Problem: Pneumonia  Goal: Fluid Balance  Outcome: Ongoing (see interventions/notes)  Goal: Absence of Infection Signs and Symptoms  Outcome: Ongoing (see interventions/notes)  Goal: Effective Oxygenation and Ventilation  Outcome: Ongoing (see interventions/notes)  Intervention: Optimize Oxygenation and Ventilation  Recent Flowsheet Documentation  Taken 06/26/2024 0800 by Fonda BRAVO, RN  Head of Bed [facility]) Positioning: HOB at 20-30 degrees      Problem: Fall Injury Risk  Goal: Absence of Fall and Fall-Related Injury  Outcome: Ongoing (see interventions/notes)  Intervention: Promote Injury-Free Environment  Recent Flowsheet Documentation  Taken 06/26/2024 0800 by Fonda BRAVO, RN  Safety Promotion/Fall Prevention:   activity supervised   nonskid shoes/slippers when out of bed   safety round/check completed   fall prevention program maintained     Problem: Health Knowledge, Opportunity to Enhance (Adult,Obstetrics,Pediatric)  Goal: Knowledgeable about Health Subject/Topic  Description: Patient will demonstrate the desired outcomes by discharge/transition of care.  Outcome: Ongoing (see interventions/notes)     Problem: Gas Exchange Impaired  Goal: Optimal Gas Exchange  Outcome: Ongoing (see interventions/notes)  Intervention: Optimize Oxygenation and Ventilation  Recent Flowsheet Documentation  Taken 06/26/2024 0800 by Fonda BRAVO, RN  Head of Bed [facility]) Positioning: HOB at 20-30 degrees     Problem: Adult Inpatient Plan of Care  Goal: Absence of Hospital-Acquired Illness or Injury  Outcome: Ongoing (see interventions/notes)  Intervention: Identify and Manage Fall Risk  Recent Flowsheet Documentation  Taken 06/26/2024 0800 by Fonda BRAVO, RN  Safety Promotion/Fall Prevention:   activity supervised   nonskid shoes/slippers when out of bed   safety round/check completed   fall prevention program maintained  Intervention: Prevent Skin Injury  Recent Flowsheet Documentation  Taken 06/26/2024 0800 by Fonda BRAVO, RN  Body Position: supine, head elevated  Skin Protection: adhesive use limited  Intervention: Prevent and Manage VTE (Venous Thromboembolism) Risk  Recent Flowsheet Documentation  Taken 06/26/2024 0800 by Fonda BRAVO, RN  VTE Prevention/Management: anticoagulant therapy maintained  Goal: Optimal Comfort and Wellbeing  Outcome: Ongoing (see interventions/notes)  Goal: Rounds/Family Conference  Outcome: Ongoing (see interventions/notes)     Problem: Skin Injury Risk  Increased  Goal: Skin Health and Integrity  Outcome: Ongoing (  see interventions/notes)  Intervention: Optimize Skin Protection  Recent Flowsheet Documentation  Taken 06/26/2024 0800 by Fonda BRAVO, RN  Pressure Reduction Techniques:   Frequent weight shifting encouraged   Supplemented with small shifts  Pressure Reduction Devices: Repositioning wedges/pillows utilized  Skin Protection: adhesive use limited  Activity Management: ROM, active encouraged  Head of Bed (HOB) Positioning: HOB at 20-30 degrees     Problem: Pneumonia  Goal: Fluid Balance  Outcome: Ongoing (see interventions/notes)  Goal: Absence of Infection Signs and Symptoms  Outcome: Ongoing (see interventions/notes)  Goal: Effective Oxygenation and Ventilation  Outcome: Ongoing (see interventions/notes)  Intervention: Optimize Oxygenation and Ventilation  Recent Flowsheet Documentation  Taken 06/26/2024 0800 by Fonda BRAVO, RN  Head of Bed [facility]) Positioning: HOB at 20-30 degrees     Problem: Fall Injury Risk  Goal: Absence of Fall and Fall-Related Injury  Outcome: Ongoing (see interventions/notes)  Intervention: Promote Scientist, Clinical (histocompatibility And Immunogenetics) Documentation  Taken 06/26/2024 0800 by Fonda BRAVO, RN  Safety Promotion/Fall Prevention:   activity supervised   nonskid shoes/slippers when out of bed   safety round/check completed   fall prevention program maintained     Problem: Health Knowledge, Opportunity to Enhance (Adult,Obstetrics,Pediatric)  Goal: Knowledgeable about Health Subject/Topic  Description: Patient will demonstrate the desired outcomes by discharge/transition of care.  Outcome: Ongoing (see interventions/notes)     Problem: Gas Exchange Impaired  Goal: Optimal Gas Exchange  Outcome: Ongoing (see interventions/notes)  Intervention: Optimize Oxygenation and Ventilation  Recent Flowsheet Documentation  Taken 06/26/2024 0800 by Fonda BRAVO, RN  Head of Bed [facility]) Positioning: HOB at 20-30 degrees

## 2024-06-26 NOTE — Nurses Notes (Signed)
 Patient to be moved to [address]. Daughter notified.

## 2024-06-26 NOTE — Respiratory Therapy (Signed)
 06/26/24 0523   Non-Invasive Ventilation Assessment   Start Time 0523   Orders Updated in EMR Yes   $ NIV NIV - subsequent   NIV Status Currently On   $ O2 Delivery BP   Oxygen  Concentration (%) 35   Circuit Checked   Proximal  Temperature 35 C (95 F)   H2O Bag Checked   Non-Invasive Ventilation Check   Type of Mask FFM   NG/OG Placed No   Facial Skin Integrity WNL   Device Hamilton C1    Mode NIV-ST   Spontaneous Volume 758 mL   Set Rate 5   Spontaneous Rate 12 Breaths Per Minute   Minute Volume 9 LPM   FiO2 Set 35%   Pressure Support (Above PEEP) 10 cmH2O   PEEP/CPAP (cm/H2O) 10 CM H20   IPAP 20 cmH20   EPAP 10 cmH2O   PIP 21 cmH2O   I-Time 0.5 seconds   SpO2 98 %   Alarms   Audible Alarms Checked & Functioning   Hi Rate 40   Lo Rate 5   Hi Pressure 40 cmH2O   Lo Pressure 5 cmH2O   Low Volume 100 m L   Leak 35 mL   Apnea Alarm 20 Seconds   Low Min Ventilation 4   Timepoint   Stop Time 0530   Check Duration 7 minutes     Patient currently sleeping on BiPAP machine.

## 2024-06-27 DIAGNOSIS — K5900 Constipation, unspecified: Secondary | ICD-10-CM

## 2024-06-27 LAB — BASIC METABOLIC PANEL
ANION GAP: 4 mmol/L (ref 4–13)
BUN/CREA RATIO: 30 — ABNORMAL HIGH (ref 6–22)
BUN: 17 mg/dL (ref 7–25)
CALCIUM: 8.4 mg/dL — ABNORMAL LOW (ref 8.6–10.3)
CHLORIDE: 104 mmol/L (ref 98–107)
CO2 TOTAL: 31 mmol/L (ref 21–31)
CREATININE: 0.57 mg/dL — ABNORMAL LOW (ref 0.60–1.30)
ESTIMATED GFR: 95 mL/min/1.73mˆ2 (ref >59)
GLUCOSE: 91 mg/dL (ref 74–109)
OSMOLALITY, CALCULATED: 279 mosm/kg (ref 270–290)
POTASSIUM: 3.9 mmol/L (ref 3.5–5.1)
SODIUM: 139 mmol/L (ref 136–145)

## 2024-06-27 LAB — CBC WITH DIFF
BASOPHIL #: 0 x10ˆ3/uL (ref 0.00–0.10)
BASOPHIL %: 0 % (ref 0–1)
EOSINOPHIL #: 0 x10ˆ3/uL (ref 0.00–0.50)
EOSINOPHIL %: 0 % — ABNORMAL LOW (ref 1–7)
HCT: 26.1 % — ABNORMAL LOW (ref 31.2–41.9)
HGB: 8.2 g/dL — ABNORMAL LOW (ref 10.9–14.3)
LYMPHOCYTE #: 0.8 x10ˆ3/uL — ABNORMAL LOW (ref 1.10–3.10)
LYMPHOCYTE %: 15 % — ABNORMAL LOW (ref 16–46)
MCH: 25.2 pg (ref 24.7–32.8)
MCHC: 31.6 g/dL — ABNORMAL LOW (ref 32.3–35.6)
MCV: 79.8 fL (ref 75.5–95.3)
MONOCYTE #: 0.6 x10ˆ3/uL (ref 0.20–0.90)
MONOCYTE %: 11 % (ref 4–11)
MPV: 9.9 fL (ref 7.9–10.8)
NEUTROPHIL #: 4 x10ˆ3/uL (ref 1.90–8.20)
NEUTROPHIL %: 73 % (ref 43–77)
PLATELETS: 115 x10ˆ3/uL — ABNORMAL LOW (ref 140–440)
RBC: 3.27 x10ˆ6/uL — ABNORMAL LOW (ref 3.63–4.92)
RDW: 18.9 % — ABNORMAL HIGH (ref 12.3–17.7)
WBC: 5.5 x10ˆ3/uL (ref 3.8–11.8)

## 2024-06-27 LAB — MAGNESIUM: MAGNESIUM: 2 mg/dL (ref 1.9–2.7)

## 2024-06-27 MED ORDER — FUROSEMIDE 10 MG/ML INJECTION SOLUTION
40.0000 mg | Freq: Two times a day (BID) | INTRAMUSCULAR | Status: DC
  Administered 2024-06-27 – 2024-06-28 (×3): 40 mg via INTRAVENOUS
  Filled 2024-06-27 (×3): qty 4

## 2024-06-27 MED ORDER — LACTULOSE 10 GRAM/15 ML ORAL SOLUTION
30.0000 mL | Freq: Every day | ORAL | Status: DC
  Administered 2024-06-27 – 2024-06-29 (×3): 30 mL via ORAL
  Filled 2024-06-27 (×3): qty 30

## 2024-06-27 MED ORDER — BUDESONIDE 0.5 MG/2 ML SUSPENSION FOR NEBULIZATION
0.5000 mg | INHALATION_SUSPENSION | Freq: Two times a day (BID) | RESPIRATORY_TRACT | Status: DC
  Administered 2024-06-27 – 2024-06-29 (×5): 0.5 mg via RESPIRATORY_TRACT
  Filled 2024-06-27 (×2): qty 2

## 2024-06-27 MED ORDER — POLYETHYLENE GLYCOL 3350 17 GRAM ORAL POWDER PACKET
17.0000 g | Freq: Every day | ORAL | Status: DC
  Administered 2024-06-27: 17 g via ORAL
  Administered 2024-06-28: 0 g via ORAL
  Administered 2024-06-29: 17 g via ORAL
  Filled 2024-06-27 (×3): qty 1

## 2024-06-27 NOTE — Respiratory Therapy (Signed)
 06/27/24 0552   High Flow Nasal Cannula   Start Time 0552   Order Updated in EMR Yes   Equipment Adult   HFNC Device Hamilton C1   HFNC Status Currently On   Facial Skin Integrity WNL   H2O Bottle Check (HFNC) Changed   Circuit Checked   Temperature 34 C (93.2 F)   Flow  45 LPM   FiO2 40 %   SpO2 97   $HFNC Subsequent Charge (Resp only) HFNC-Sub   Stop Time 0558   Duration 6 Minutes

## 2024-06-27 NOTE — Progress Notes (Signed)
 [place] MEDICINE [facility]    HOSPITALIST PROGRESS NOTE    Misty Johnson  Date of service: 06/27/2024  Date of Admission:  06/21/2024  Hospital Day:  LOS: 6 days     Interval History:   Follow up on 06/26/2024:  Patient is sitting up in the bed, sipping on some coffee.  She is on high-flow nasal cannula.  Used BiPAP last night.  Still having moderate dyspnea with any exertion.  Reports occasional cough.    06/27/2024 Patient seen and examined at bedside. States she feels like crap. On HFNC.  Has not had a BM .  Is eating ok.          Filed Vitals:    06/27/24 0940 06/27/24 1010 06/27/24 1041 06/27/24 1111   BP:    (!) 146/49   Pulse:    76   Resp:    18   Temp:    36.7 C (98.1 F)   SpO2: 94% 99% 95% 96%       Intake/Output Summary (Last 24 hours) at 06/27/2024 1126  Last data filed at 06/27/2024 1016  Gross per 24 hour   Intake 665 ml   Output --   Net 665 ml       Start Time: 0940  $ O2 Delivery: Nasal Cannula  Flow (L/min) (RT): 4  FiO2 (RT): 36 %  SpO2: 96 %         Scheduled Medication PRN Medications     acetylcysteine  (MUCOMYST ) 20% nebulized solution, 4 mL, 3x/day    ampicillin -sulbactam (UNASYN ) 3 g in NS 100 mL IVPB with adaptor, 3 g, Q6H    atorvastatin  (LIPITOR) tablet, 40 mg, QPM    budesonide  (PULMICORT  RESPULES) 0.5 mg/2 mL nebulizer suspension, 0.5 mg, 2x/day    doxycycline  tablet, 100 mg, 2x/day    enoxaparin  PF (LOVENOX ) 40 mg/0.4 mL SubQ injection, 40 mg, Q24H    escitalopram  (LEXAPRO ) tablet, 20 mg, Daily    furosemide  (LASIX ) 10 mg/mL injection, 40 mg, Q12H    [Held by provider] furosemide  (LASIX ) tablet, 40 mg, Daily    guaiFENesin  (MUCINEX ) extended release tablet - for cough (expectorant), 600 mg, 2x/day    ipratropium-albuterol  0.5 mg-3 mg(2.5 mg base)/3 mL Solution for Nebulization, 3 mL, Q4H    lactulose  (ENULOSE ) 10g per 15mL oral liquid, 30 mL, Daily    methylPREDNISolone  sod succ (SOLU-medrol ) 40 mg/mL injection, 40 mg, Daily    metoprolol  succinate (TOPROL -XL) 24 hr  extended release tablet, 25 mg, Daily    NS flush syringe, 3 mL, Q8HRS    pantoprazole  (PROTONIX ) delayed release tablet, 40 mg, Daily    polyethylene glycol (MIRALAX ) oral packet, 17 g, Daily    ramelteon  (ROZEREM ) tablet, 8 mg, NIGHTLY    rOPINIRole  (REQUIP ) tablet, 0.25 mg, QPM    acetaminophen  (TYLENOL ) tablet, 650 mg, Q6H PRN    NS 250 mL flush bag, , Q15 Min PRN **AND** D5W 250 mL flush bag, , Q15 Min PRN **AND** Small Volume Medication Infusion Line Flush, , UNTIL DISCONTINUED    NS 250 mL flush bag, , Q15 Min PRN **AND** D5W 250 mL flush bag, , Q15 Min PRN **AND** Small Volume Medication Infusion Line Flush, , UNTIL DISCONTINUED    NS 250 mL flush bag, , Q15 Min PRN **AND** D5W 250 mL flush bag, , Q15 Min PRN **AND** Small Volume Medication Infusion Line Flush, , UNTIL DISCONTINUED    NS 250 mL flush bag, , Q15 Min PRN **AND** D5W 250  mL flush bag, , Q15 Min PRN **AND** Small Volume Medication Infusion Line Flush, , UNTIL DISCONTINUED    ipratropium-albuterol  0.5 mg-3 mg(2.5 mg base)/3 mL Solution for Nebulization, 3 mL, Q4H PRN    sennosides-docusate sodium  (SENOKOT-S) 8.6-50mg  per tablet, 1 Tablet, 2x/day PRN      Insulin Sliding Scale Requirement:  Antihyperglycemics (last 24 hours)       None             Labs:  CBC RESULTS CMP RESULTS   Recent Labs     06/25/24  0435 06/26/24  0424 06/27/24  0311   WBC 5.1 5.3 5.5   RBC 3.15* 2.93* 3.27*   HGB 7.9* 7.4* 8.2*   HCT 26.3* 23.5* 26.1*   MCV 83.4 80.0 79.8   MCH 25.1 25.3 25.2   MCHC 30.1* 31.7* 31.6*   RDW 19.3* 18.2* 18.9*   PLTCNT 128* 112* 115*   MPV 10.4 9.6 9.9     Recent Labs     06/25/24  0258 06/25/24  0435 06/25/24  0530 06/26/24  0424 06/27/24  0311   SODIUM 137 140 137 143 139   POTASSIUM  --  4.7  --  4.0 3.9   CHLORIDE 104 106 106 107 104   CO2  --  28  --  32* 31   ANIONGAP  --  6  --  4 4   BUN  --  19  --  21 17   CREATININE  --  0.58*  --  0.68 0.57*   BUNCRRATIO  --  33*  --  31* 30*   GFR  --  94  --  91 95   ALBUMIN  --  3.4*  --   --    --    CALCIUM   --  8.4*  --  8.4* 8.4*   GLUCOSENF  --  119*  --  93 91   ALKPHOS  --  47  --   --   --    ALT  --  5*  --   --   --    AST  --  12*  --   --   --    TOTBILIRUBIN  --  0.7  --   --   --    TOTALPROTEIN  --  5.3*  --   --   --    ALBGLOB  --  1.8*  --   --   --    OSMBLD  --  283  --  288 279   CORCA  --  8.9  --   --   --    GLOBULIN  --  1.9*  --   --   --       OTHER CHEMISTRIES RESULTS CREATININE CLEARENCE   Recent Labs     06/25/24  0435 06/26/24  0424 06/27/24  0311   MAGNESIUM  2.0 1.9 2.0    Renal Creatinine (72h ago through now)       Date/Time Serum Creatinine Range CrCl    06/27/24 0311 0.57 mg/dL   9.39 - 8.69 mg/dL 15.2 mL/min    87/75/74 0424 0.68 mg/dL   9.39 - 8.69 mg/dL 71 mL/min    87/76/74 9564 0.58 mg/dL   9.39 - 8.69 mg/dL 16.7 mL/min            COAG RESULTS CARDIAC MARKERS   No results for input(s): APTT, PROTHROMTME, INR in the last 72 hours.  No results for  input(s): TROPONINI, BNP, NTPROBNP, LACTICACID, LIPASE in the last 72 hours.     HA1C TSH LIPID PANEL   No results found for: HA1C   No results found for: TSH   No results found for: CHOLESTEROL, TRIG, HDLCHOL, LDLCHOL, VLDLCAL, CHOLHDLRATIO       Assessment & Plan:    Bilateral pneumonia, suspected Gram-positive  Acute on chronic hypoxic respiratory failure  -CXR showing bibasilar atelectasis vs PNA   -titrate O2 to maintain O2 sat 89-92%  -neb treatments as needed   -incentive spirometry   -continue unasyn  and doxycycline    -mucinex    - On high-flow nasal cannula continue to titrate   - Chest CT showed small pleural effusions, consolidation  - Solu-Medrol  40 mg IV daily        Melena   Chronic iron  deficiency anemia   -stool for occult blood +  -monitor Hgb   -iron  studies reviewed   -Venofer  x 3 doses completed.  Give 2 more doses.  - GI following, EGD completed yesterday.  Moderate distal esophagitis, LA grade a, small hiatal hernia, mild gastritis, no ulcers or bleeding seen.  Continue  Protonix  40 mg daily.  Elective colonoscopy as an outpatient after pneumonia resolves.     Depression   - Resume Lexapro      History of CHF though last two echos normal   -crackles noted   -Lasix  40 mg IV bid started     Constipation   -start bowel regimen        Disposition Planning:  Skilled nursing facility      IVF:   Current Facility-Administered Medications   Medication Dose Frequency Last Rate     GI Prophylaxis : Pantoprazole   Bowel Regimen:  Senokot  Date of Last Bowel Movement: 06/22/24  DVT/PE Prophylaxis :    Daily Orders (From admission, onward)       Start     Ordered    06/21/24 1900  enoxaparin  PF (LOVENOX ) 40 mg/0.4 mL SubQ injection  (MEDIUM RISK VTE LEVEL)  EVERY 24 HOURS        Question:  Select Indication of Use  Answer:  VTE Prophylaxis    06/21/24 1838    06/21/24 1845  PT IS INTERMEDIATE RISK FOR VENOUS THROMBOEMBOLISM  (MEDIUM RISK VTE LEVEL)  CONTINUOUS        References:    VTE RISK ASSESSMENT TOOL    06/21/24 1838                   Anticoagulants (last 24 hours)       Date/Time Action Medication Dose    06/26/24 1823 Given    enoxaparin  PF (LOVENOX ) 40 mg/0.4 mL SubQ injection 40 mg           Lines:   Patient Lines/Drains/Airways Status       Active Line / Dialysis Catheter / Dialysis Graft / Drain / Airway / Wound       Name Placement date Placement time Site Days    Peripheral IV Posterior;Right Wrist 06/26/24  1900  -- less than 1                   Diet: Nutrition:    DIET CARDIAC (2G NA, LOWFAT, LOW CHOL) Do you want to initiate MNT Protocol? Yes; Additional modifications/limitations: GI/SOFT    Additional clinical characteristics related to nutrition:    - monitor for weight changes   - monitor intake and output    - monitor  bowel functions                 On the day of the encounter, a total of 25 minutes was spent on this patient encounter including review of historical information, examination, documentation and post-visit activities. The time documented excludes procedural time  and time spent on wellness portion of the visit.     Shamone Winzer Johnson   06/27/2024  [facility] MEDICINE HOSPITALIST

## 2024-06-27 NOTE — Nurses Notes (Signed)
 Pt removed her BiPAP mask herself stating it's making her head burn up, and doesn't want to continue it. Notified the RT regarding pt's concerns and need for setting up HFNC.

## 2024-06-27 NOTE — Respiratory Therapy (Signed)
 06/27/24 2018   Non-Invasive Ventilation Assessment   Start Time 2018   Orders Updated in EMR Yes   $ NIV NIV - subsequent   NIV Status Found Off Bipap and On   $ O2 Delivery NC   Flow (L/min) (Oxygen  Therapy) 3

## 2024-06-27 NOTE — Care Plan (Signed)
 Pt's sleep was on and off throughout the night. She has intermittent confusion. Re-oriented as needed. O2 continuous at 40%, 45 LPM via HFNC. Needs addressed. Pulse ox and tele monitoring continuous. Fall precautions maintained. Call bell within reach.    Problem: Pneumonia  Goal: Effective Oxygenation and Ventilation  Outcome: Ongoing (see interventions/notes)  Intervention: Optimize Oxygenation and Ventilation  Recent Flowsheet Documentation  Taken 06/26/2024 1930 by Maurie HERO, RN  Head of Bed [facility]) Positioning: HOB elevated   O2 administration and titration as needed. BiPAP as tolerated.

## 2024-06-27 NOTE — Respiratory Therapy (Signed)
 06/27/24 0139   Non-Invasive Ventilation Assessment   Start Time 0139   Orders Updated in EMR Yes   $ NIV NIV - subsequent   NIV Status Taken Off Bipap   $ O2 Delivery UHFNC   Oxygen  Concentration (%) 40     Called by nurse to take pt off BIPAP. Pt states she can't tolerate BIPAP. Placed pt back on HFNC 45L, 40%.

## 2024-06-27 NOTE — Care Plan (Signed)
 Pt admitted with pneumonia, pleural effusions.  Pt on continuous pulse ox and telemetry monitoring. Pt receiving IV Unasyn , Mucinex , Solumedrol and Duonebs q 4 hours. Pt was able to be titrated off of High Flo and placed on O2 at 3lpm and sat running in mid 90's .Pt started on Lasix  Iv and Pulmicort  HHN today. Pt having constipation and was given Miralax  and Lactulose  with no results yet. Pt and family educated on disease process.  Fall risk protocol in place. Bed alarm in use . Bed in lowest position       Problem: Adult Inpatient Plan of Care  Goal: Absence of Hospital-Acquired Illness or Injury  Outcome: Ongoing (see interventions/notes)  Intervention: Identify and Manage Fall Risk  Recent Flowsheet Documentation  Taken 06/27/2024 0830 by Tawni NOVAK, RN  Safety Promotion/Fall Prevention:   activity supervised   fall prevention program maintained   motion sensor pad activated   nonskid shoes/slippers when out of bed   safety round/check completed  Intervention: Prevent Skin Injury  Recent Flowsheet Documentation  Taken 06/27/2024 1200 by Tawni NOVAK, RN  Body Position:   side lying, right   heels elevated off mattress  Taken 06/27/2024 0830 by Tawni NOVAK, RN  Skin Protection:   adhesive use limited   tubing/devices free from skin contact  Intervention: Prevent and Manage VTE (Venous Thromboembolism) Risk  Recent Flowsheet Documentation  Taken 06/27/2024 0830 by Tawni NOVAK, RN  VTE Prevention/Management: dorsiflexion/plantar flexion performed  Intervention: Prevent Infection  Recent Flowsheet Documentation  Taken 06/27/2024 0830 by Tawni NOVAK, RN  Infection Prevention: promote handwashing  Goal: Optimal Comfort and Wellbeing  Outcome: Ongoing (see interventions/notes)  Intervention: Provide Person-Centered Care  Recent Flowsheet Documentation  Taken 06/27/2024 0830 by Tawni NOVAK, RN  Trust Relationship/Rapport:   care explained   questions answered   questions encouraged   reassurance provided    thoughts/feelings acknowledged  Goal: Rounds/Family Conference  Outcome: Ongoing (see interventions/notes)     Problem: Skin Injury Risk Increased  Goal: Skin Health and Integrity  Outcome: Ongoing (see interventions/notes)  Intervention: Optimize Skin Protection  Recent Flowsheet Documentation  Taken 06/27/2024 0830 by Tawni NOVAK, RN  Pressure Reduction Techniques: Frequent weight shifting encouraged  Pressure Reduction Devices: Repositioning wedges/pillows utilized  Skin Protection:   adhesive use limited   tubing/devices free from skin contact  Activity Management: ROM, active encouraged  Head of Bed (HOB) Positioning: HOB at 45 degrees     Problem: Pneumonia  Goal: Fluid Balance  Outcome: Ongoing (see interventions/notes)  Goal: Absence of Infection Signs and Symptoms  Outcome: Ongoing (see interventions/notes)  Intervention: Prevent Infection Progression  Recent Flowsheet Documentation  Taken 06/27/2024 0830 by Tawni NOVAK, RN  Fever Reduction/Comfort Measures: lightweight bedding  Goal: Effective Oxygenation and Ventilation  Outcome: Ongoing (see interventions/notes)  Intervention: Optimize Oxygenation and Ventilation  Recent Flowsheet Documentation  Taken 06/27/2024 0830 by Tawni NOVAK, RN  Head of Bed [facility]) Positioning: HOB at 45 degrees     Problem: Fall Injury Risk  Goal: Absence of Fall and Fall-Related Injury  Outcome: Ongoing (see interventions/notes)  Intervention: Identify and Manage Contributors  Recent Flowsheet Documentation  Taken 06/27/2024 0830 by Tawni NOVAK, RN  Self-Care Promotion: independence encouraged  Medication Review/Management: medications reviewed  Intervention: Promote Injury-Free Environment  Recent Flowsheet Documentation  Taken 06/27/2024 0830 by Tawni NOVAK, RN  Safety Promotion/Fall Prevention:   activity supervised   fall prevention program maintained   motion sensor pad activated   nonskid shoes/slippers when out of bed  safety round/check completed     Problem: Health  Knowledge, Opportunity to Enhance (Adult,Obstetrics,Pediatric)  Goal: Knowledgeable about Health Subject/Topic  Description: Patient will demonstrate the desired outcomes by discharge/transition of care.  Outcome: Ongoing (see interventions/notes)     Problem: Gas Exchange Impaired  Goal: Optimal Gas Exchange  Outcome: Ongoing (see interventions/notes)  Intervention: Optimize Oxygenation and Ventilation  Recent Flowsheet Documentation  Taken 06/27/2024 0830 by Tawni NOVAK, RN  Head of Bed [facility]) Positioning: HOB at 45 degrees

## 2024-06-27 NOTE — Respiratory Therapy (Signed)
 06/26/24 2357   Non-Invasive Ventilation Assessment   Start Time 2357   Orders Updated in EMR Yes   $ NIV NIV - subsequent   NIV Status Placed On Bipap   $ O2 Delivery BP   Oxygen  Concentration (%) 40   Oxygen  Noting/Safety Checks Oxygen  Apparatus Checked and Functioning Properly;Patient in No Apparent Respiratory Distress   Circuit Checked   Proximal  Temperature 35 C (95 F)   H2O Bag Checked   Hepa Filter Checked   Non-Invasive Ventilation Check   Type of Mask FFM   NG/OG Placed No   Facial Skin Integrity WNL   Device Hamilton C1    Mode NIV-ST   Spontaneous Volume 524 mL   Set Rate 5   Spontaneous Rate 6 Breaths Per Minute   Minute Volume 9.7 LPM   FiO2 Set 40%   Pressure Support (Above PEEP) 10 cmH2O   PEEP/CPAP (cm/H2O) 10 CM H20   IPAP 20 cmH20   EPAP 10 cmH2O   PIP 24 cmH2O   I-Time 0.5 seconds   SpO2 99 %   Alarms   Audible Alarms Checked & Functioning   Hi Rate 40   Lo Rate 5   Hi Pressure 40 cmH2O   Lo Pressure 5 cmH2O   Low Volume 100 m L   Leak 0 mL   Apnea Alarm 20 Seconds   Low Min Ventilation 4   Timepoint   Stop Time 0004   Check Duration 7 minutes

## 2024-06-27 NOTE — Respiratory Therapy (Signed)
 06/27/24 0139   High Flow Nasal Cannula   Start Time 0139   Order Updated in EMR Yes   Equipment Adult   HFNC Device Hamilton C1   HFNC Status Placed On HFNC   Facial Skin Integrity WNL   H2O Bottle Check (HFNC) Checked   Circuit Checked   Temperature 34 C (93.2 F)   Flow  45 LPM   FiO2 40 %   SpO2 96   $HFNC Subsequent Charge (Resp only) HFNC-Sub   Stop Time 0145   Duration 6 Minutes

## 2024-06-27 NOTE — Respiratory Therapy (Signed)
 Pt taken of UHHFNC and placed on 4L nasal canula as requested by Dr.Garg. Will continue to monitor.

## 2024-06-28 ENCOUNTER — Inpatient Hospital Stay (HOSPITAL_COMMUNITY): Payer: Medicare (Managed Care)

## 2024-06-28 DIAGNOSIS — J9811 Atelectasis: Secondary | ICD-10-CM

## 2024-06-28 LAB — COMPREHENSIVE METABOLIC PANEL, NON-FASTING
ALBUMIN/GLOBULIN RATIO: 1.8 — ABNORMAL HIGH (ref 0.8–1.4)
ALBUMIN: 3.6 g/dL (ref 3.5–5.7)
ALKALINE PHOSPHATASE: 45 U/L (ref 34–104)
ALT (SGPT): 8 U/L (ref 7–52)
ANION GAP: 8 mmol/L (ref 4–13)
AST (SGOT): 15 U/L (ref 13–39)
BILIRUBIN TOTAL: 1 mg/dL (ref 0.3–1.0)
BUN/CREA RATIO: 23 — ABNORMAL HIGH (ref 6–22)
BUN: 15 mg/dL (ref 7–25)
CALCIUM, CORRECTED: 8.9 mg/dL (ref 8.9–10.8)
CALCIUM: 8.6 mg/dL (ref 8.6–10.3)
CHLORIDE: 98 mmol/L (ref 98–107)
CO2 TOTAL: 34 mmol/L — ABNORMAL HIGH (ref 21–31)
CREATININE: 0.66 mg/dL (ref 0.60–1.30)
ESTIMATED GFR: 91 mL/min/1.73mˆ2 (ref >59)
GLOBULIN: 2 (ref 2.0–3.5)
GLUCOSE: 81 mg/dL (ref 74–109)
OSMOLALITY, CALCULATED: 279 mosm/kg (ref 270–290)
POTASSIUM: 3.4 mmol/L — ABNORMAL LOW (ref 3.5–5.1)
PROTEIN TOTAL: 5.6 g/dL — ABNORMAL LOW (ref 6.4–8.9)
SODIUM: 140 mmol/L (ref 136–145)

## 2024-06-28 LAB — CBC
HCT: 28.2 % — ABNORMAL LOW (ref 31.2–41.9)
HGB: 9 g/dL — ABNORMAL LOW (ref 10.9–14.3)
MCH: 25.4 pg (ref 24.7–32.8)
MCHC: 32 g/dL — ABNORMAL LOW (ref 32.3–35.6)
MCV: 79.4 fL (ref 75.5–95.3)
MPV: 9.9 fL (ref 7.9–10.8)
PLATELETS: 121 x10ˆ3/uL — ABNORMAL LOW (ref 140–440)
RBC: 3.55 x10ˆ6/uL — ABNORMAL LOW (ref 3.63–4.92)
RDW: 18.3 % — ABNORMAL HIGH (ref 12.3–17.7)
WBC: 6.4 x10ˆ3/uL (ref 3.8–11.8)

## 2024-06-28 LAB — MAGNESIUM: MAGNESIUM: 1.8 mg/dL — ABNORMAL LOW (ref 1.9–2.7)

## 2024-06-28 MED ORDER — MAGNESIUM OXIDE 400 MG (241.3 MG MAGNESIUM) TABLET
400.0000 mg | ORAL_TABLET | Freq: Two times a day (BID) | ORAL | Status: DC
  Administered 2024-06-28 – 2024-06-29 (×3): 400 mg via ORAL
  Filled 2024-06-28 (×3): qty 1

## 2024-06-28 MED ORDER — SODIUM CHLORIDE 0.9 % INTRAVENOUS SOLUTION
4.5000 g | INTRAVENOUS | Status: AC
  Administered 2024-06-28: 0 g via INTRAVENOUS
  Administered 2024-06-28: 4.5 g via INTRAVENOUS
  Filled 2024-06-28: qty 20

## 2024-06-28 MED ORDER — IPRATROPIUM 0.5 MG-ALBUTEROL 3 MG (2.5 MG BASE)/3 ML NEBULIZATION SOLN
3.0000 mL | INHALATION_SOLUTION | RESPIRATORY_TRACT | Status: DC
  Administered 2024-06-28 – 2024-06-29 (×8): 3 mL via RESPIRATORY_TRACT
  Filled 2024-06-28 (×2): qty 3

## 2024-06-28 MED ORDER — POTASSIUM CHLORIDE ER 20 MEQ TABLET,EXTENDED RELEASE(PART/CRYST)
40.0000 meq | ORAL_TABLET | Freq: Two times a day (BID) | ORAL | Status: AC
  Administered 2024-06-28 – 2024-06-29 (×2): 40 meq via ORAL
  Filled 2024-06-28 (×2): qty 2

## 2024-06-28 MED ORDER — SODIUM CHLORIDE 0.9 % INTRAVENOUS SOLUTION
4.5000 g | Freq: Three times a day (TID) | INTRAVENOUS | Status: DC
  Administered 2024-06-28: 4.5 g via INTRAVENOUS
  Administered 2024-06-29: 0 g via INTRAVENOUS
  Administered 2024-06-29: 4.5 g via INTRAVENOUS
  Administered 2024-06-29: 0 g via INTRAVENOUS
  Filled 2024-06-28 (×2): qty 20

## 2024-06-28 MED ORDER — FUROSEMIDE 10 MG/ML INJECTION SOLUTION
40.0000 mg | Freq: Every day | INTRAMUSCULAR | Status: DC
  Administered 2024-06-29: 40 mg via INTRAVENOUS
  Filled 2024-06-28: qty 4

## 2024-06-28 NOTE — Progress Notes (Signed)
 [place] [facility]ICINE [facility]    HOSPITALIST PROGRESS NOTE    Misty Johnson  Date of service: 06/28/2024  Date of Admission:  06/21/2024  Hospital Day:  LOS: 7 days     Interval History:   Follow up on 06/26/2024:  Patient is sitting up in the bed, sipping on some coffee.  She is on high-flow nasal cannula.  Used BiPAP last night.  Still having moderate dyspnea with any exertion.  Reports occasional cough.    06/27/2024 Patient seen and examined at bedside. States she feels like crap. On HFNC.  Has not had a BM .  Is eating ok.        06/28/2024 Patient seen and examined. Patient sitting up in geri chair. On HFNC 40/40.  Had BM this am       Filed Vitals:    06/28/24 0800 06/28/24 0940 06/28/24 1006 06/28/24 1136   BP:  (!) 125/44  104/76   Pulse:  67  77   Resp:    18   Temp:    36.9 C (98.5 F)   SpO2: 96%  94% 95%       Intake/Output Summary (Last 24 hours) at 06/28/2024 1143  Last data filed at 06/28/2024 1108  Gross per 24 hour   Intake 908 ml   Output 3050 ml   Net -2142 ml       Start Time: 0804  $ O2 Delivery: High Flow Nasal Cannula  Flow (L/min) (RT): 40  FiO2 (RT): 40 %  SpO2: 95 %         Scheduled Medication PRN Medications     acetylcysteine  (MUCOMYST ) 20% nebulized solution, 4 mL, 3x/day    ampicillin -sulbactam (UNASYN ) 3 g in NS 100 mL IVPB with adaptor, 3 g, Q6H    atorvastatin  (LIPITOR) tablet, 40 mg, QPM    budesonide  (PULMICORT  RESPULES) 0.5 mg/2 mL nebulizer suspension, 0.5 mg, 2x/day    doxycycline  tablet, 100 mg, 2x/day    enoxaparin  PF (LOVENOX ) 40 mg/0.4 mL SubQ injection, 40 mg, Q24H    escitalopram  (LEXAPRO ) tablet, 20 mg, Daily    furosemide  (LASIX ) 10 mg/mL injection, 40 mg, Q12H    [Held by provider] furosemide  (LASIX ) tablet, 40 mg, Daily    guaiFENesin  (MUCINEX ) extended release tablet - for cough (expectorant), 600 mg, 2x/day    ipratropium-albuterol  0.5 mg-3 mg(2.5 mg base)/3 mL Solution for Nebulization, 3 mL, Q4H    lactulose  (ENULOSE ) 10g per 15mL oral liquid, 30  mL, Daily    magnesium  oxide (MAG-OX) 400mg  (241.3 mg elemental magnesium ) tablet, 400 mg, 2x/day    methylPREDNISolone  sod succ (SOLU-medrol ) 40 mg/mL injection, 40 mg, Daily    metoprolol  succinate (TOPROL -XL) 24 hr extended release tablet, 25 mg, Daily    NS flush syringe, 3 mL, Q8HRS    pantoprazole  (PROTONIX ) delayed release tablet, 40 mg, Daily    polyethylene glycol (MIRALAX ) oral packet, 17 g, Daily    potassium chloride  (K-DUR) extended release tablet, 40 mEq, 2x/day-Food    ramelteon  (ROZEREM ) tablet, 8 mg, NIGHTLY    rOPINIRole  (REQUIP ) tablet, 0.25 mg, QPM    acetaminophen  (TYLENOL ) tablet, 650 mg, Q6H PRN    NS 250 mL flush bag, , Q15 Min PRN **AND** D5W 250 mL flush bag, , Q15 Min PRN **AND** Small Volume Medication Infusion Line Flush, , UNTIL DISCONTINUED    NS 250 mL flush bag, , Q15 Min PRN **AND** D5W 250 mL flush bag, , Q15 Min PRN **AND** Small Volume Medication  Infusion Line Flush, , UNTIL DISCONTINUED    NS 250 mL flush bag, , Q15 Min PRN **AND** D5W 250 mL flush bag, , Q15 Min PRN **AND** Small Volume Medication Infusion Line Flush, , UNTIL DISCONTINUED    NS 250 mL flush bag, , Q15 Min PRN **AND** D5W 250 mL flush bag, , Q15 Min PRN **AND** Small Volume Medication Infusion Line Flush, , UNTIL DISCONTINUED    ipratropium-albuterol  0.5 mg-3 mg(2.5 mg base)/3 mL Solution for Nebulization, 3 mL, Q4H PRN    sennosides-docusate sodium  (SENOKOT-S) 8.6-50mg  per tablet, 1 Tablet, 2x/day PRN      Insulin Sliding Scale Requirement:  Antihyperglycemics (last 24 hours)       None             Labs:  CBC RESULTS CMP RESULTS   Recent Labs     06/26/24  0424 06/27/24  0311 06/28/24  0307   WBC 5.3 5.5 6.4   RBC 2.93* 3.27* 3.55*   HGB 7.4* 8.2* 9.0*   HCT 23.5* 26.1* 28.2*   MCV 80.0 79.8 79.4   MCH 25.3 25.2 25.4   MCHC 31.7* 31.6* 32.0*   RDW 18.2* 18.9* 18.3*   PLTCNT 112* 115* 121*   MPV 9.6 9.9 9.9     Recent Labs     06/26/24  0424 06/27/24  0311 06/28/24  0307   SODIUM 143 139 140   POTASSIUM 4.0 3.9  3.4*   CHLORIDE 107 104 98   CO2 32* 31 34*   ANIONGAP 4 4 8    BUN 21 17 15    CREATININE 0.68 0.57* 0.66   BUNCRRATIO 31* 30* 23*   GFR 91 95 91   ALBUMIN  --   --  3.6   CALCIUM  8.4* 8.4* 8.6   GLUCOSENF 93 91 81   ALKPHOS  --   --  45   ALT  --   --  8   AST  --   --  15   TOTBILIRUBIN  --   --  1.0   TOTALPROTEIN  --   --  5.6*   ALBGLOB  --   --  1.8*   OSMBLD 288 279 279   CORCA  --   --  8.9   GLOBULIN  --   --  2.0      OTHER CHEMISTRIES RESULTS CREATININE CLEARENCE   Recent Labs     06/26/24  0424 06/27/24  0311 06/28/24  0307   MAGNESIUM  1.9 2.0 1.8*    Renal Creatinine (72h ago through now)       Date/Time Serum Creatinine Range CrCl    06/28/24 0307 0.66 mg/dL   9.39 - 8.69 mg/dL 27.6 mL/min    87/74/74 0311 0.57 mg/dL   9.39 - 8.69 mg/dL 16.2 mL/min    87/75/74 0424 0.68 mg/dL   9.39 - 8.69 mg/dL 29.7 mL/min            COAG RESULTS CARDIAC MARKERS   No results for input(s): APTT, PROTHROMTME, INR in the last 72 hours.  No results for input(s): TROPONINI, BNP, NTPROBNP, LACTICACID, LIPASE in the last 72 hours.     HA1C TSH LIPID PANEL   No results found for: HA1C   No results found for: TSH   No results found for: CHOLESTEROL, TRIG, HDLCHOL, LDLCHOL, VLDLCAL, CHOLHDLRATIO       Assessment & Plan:    Bilateral pneumonia, suspected Gram-positive  Acute on chronic hypoxic respiratory failure  -CXR showing  bibasilar atelectasis vs PNA   -titrate O2 to maintain O2 sat 89-92%  -neb treatments as needed   -incentive spirometry   -continue unasyn  and doxycycline    -mucinex    - On high-flow nasal cannula continue to titrate   - Chest CT showed small pleural effusions, consolidation  - Solu-Medrol  40 mg IV daily-  -CXR this am showing left basilar atelectasis : continue incentive spirometry   -continue to wean O2         Melena   Chronic iron  deficiency anemia   -stool for occult blood +  -monitor Hgb   -iron  studies reviewed   -Venofer  x 3 doses completed.  Give 2 more doses.  - GI  following, EGD completed yesterday.  Moderate distal esophagitis, LA grade a, small hiatal hernia, mild gastritis, no ulcers or bleeding seen.  Continue Protonix  40 mg daily.  Elective colonoscopy as an outpatient after pneumonia resolves.     Depression   - Resume Lexapro      History of CHF though last two echos normal   -crackles noted   -Lasix  40 mg IV bid started     Constipation   -start bowel regimen        Disposition Planning:  Skilled nursing facility      IVF:   Current Facility-Administered Medications   Medication Dose Frequency Last Rate     GI Prophylaxis : Pantoprazole   Bowel Regimen:  Senokot  Date of Last Bowel Movement: 06/28/24  DVT/PE Prophylaxis :    Daily Orders (From admission, onward)       Start     Ordered    06/21/24 1900  enoxaparin  PF (LOVENOX ) 40 mg/0.4 mL SubQ injection  (MEDIUM RISK VTE LEVEL)  EVERY 24 HOURS        Question:  Select Indication of Use  Answer:  VTE Prophylaxis    06/21/24 1838    06/21/24 1845  PT IS INTERMEDIATE RISK FOR VENOUS THROMBOEMBOLISM  (MEDIUM RISK VTE LEVEL)  CONTINUOUS        References:    VTE RISK ASSESSMENT TOOL    06/21/24 1838                   Anticoagulants (last 24 hours)       Date/Time Action Medication Dose    06/27/24 1807 Given    enoxaparin  PF (LOVENOX ) 40 mg/0.4 mL SubQ injection 40 mg           Lines:   Patient Lines/Drains/Airways Status       Active Line / Dialysis Catheter / Dialysis Graft / Drain / Airway / Wound       Name Placement date Placement time Site Days    Peripheral IV Posterior;Right Wrist 06/26/24  1900  -- 1    Peripheral IV Posterior;Right Dorsal Metacarpals  (top of hand) 06/27/24  2100  -- less than 1    External Urinary Catheter 06/27/24  1900  -- less than 1                   Diet: Nutrition:    DIET CARDIAC (2G NA, LOWFAT, LOW CHOL) Do you want to initiate MNT Protocol? Yes; Additional modifications/limitations: GI/SOFT    Additional clinical characteristics related to nutrition:    - monitor for weight changes   -  monitor intake and output    - monitor bowel functions                 On  the day of the encounter, a total of 25 minutes was spent on this patient encounter including review of historical information, examination, documentation and post-visit activities. The time documented excludes procedural time and time spent on wellness portion of the visit.     Yula Crotwell PA-C   06/28/2024  [place] [facility] HOSPITALIST

## 2024-06-28 NOTE — Care Plan (Signed)
 Pt's sleep was on and off throughout the night. She is alert and oriented. However, she has intermittent confusion. SPO2 maintained with O2 at 40 LPM, 40% via HFNC. Had 2 BM at night. External catheter in use. Needs addressed. Tele and pulse ox monitoring continuous. Fall precautions maintained. Call bell within reach.    Problem: Pneumonia  Goal: Effective Oxygenation and Ventilation  Outcome: Ongoing (see interventions/notes)  Intervention: Optimize Oxygenation and Ventilation  Recent Flowsheet Documentation  Taken 06/27/2024 2000 by Maurie HERO, RN  Head of Bed [facility]) Positioning: 30 degrees   O2 administration and titration as needed. Encourage deep breathing and incentive spirometry exercises.

## 2024-06-28 NOTE — Care Plan (Signed)
 Problem: Adult Inpatient Plan of Care  Goal: Absence of Hospital-Acquired Illness or Injury  Outcome: Ongoing (see interventions/notes)  Intervention: Identify and Manage Fall Risk  Recent Flowsheet Documentation  Taken 06/28/2024 0928 by Annabella HERO, RN  Safety Promotion/Fall Prevention:   activity supervised   fall prevention program maintained   nonskid shoes/slippers when out of bed   safety round/check completed  Intervention: Prevent Skin Injury  Recent Flowsheet Documentation  Taken 06/28/2024 1100 by Annabella HERO, RN  Skin Protection: adhesive use limited  Taken 06/28/2024 1000 by Annabella HERO, RN  Body Position: supine, head elevated  Intervention: Prevent and Manage VTE (Venous Thromboembolism) Risk  Recent Flowsheet Documentation  Taken 06/28/2024 9071 by Annabella HERO, RN  VTE Prevention/Management: dorsiflexion/plantar flexion performed  Goal: Optimal Comfort and Wellbeing  Outcome: Ongoing (see interventions/notes)  Intervention: Provide Person-Centered Care  Recent Flowsheet Documentation  Taken 06/28/2024 9071 by Annabella HERO, RN  Trust Relationship/Rapport:   care explained   choices provided   emotional support provided   empathic listening provided   questions answered   questions encouraged   reassurance provided   thoughts/feelings acknowledged  Goal: Rounds/Family Conference  Outcome: Ongoing (see interventions/notes)     Problem: Skin Injury Risk Increased  Goal: Skin Health and Integrity  Outcome: Ongoing (see interventions/notes)  Intervention: Optimize Skin Protection  Recent Flowsheet Documentation  Taken 06/28/2024 1100 by Annabella HERO, RN  Pressure Reduction Techniques:   Mobility is maximized   Moisture, shear and nutrition are maximized   Supplemented with small shifts   Frequent weight shifting encouraged  Pressure Reduction Devices: Repositioning wedges/pillows utilized  Skin Protection: adhesive use limited     Problem: Pneumonia  Goal: Fluid Balance  Outcome: Ongoing (see  interventions/notes)  Goal: Absence of Infection Signs and Symptoms  Outcome: Ongoing (see interventions/notes)  Goal: Effective Oxygenation and Ventilation  Outcome: Ongoing (see interventions/notes)     Problem: Fall Injury Risk  Goal: Absence of Fall and Fall-Related Injury  Outcome: Ongoing (see interventions/notes)  Intervention: Promote Injury-Free Environment  Recent Flowsheet Documentation  Taken 06/28/2024 0928 by Annabella HERO, RN  Safety Promotion/Fall Prevention:   activity supervised   fall prevention program maintained   nonskid shoes/slippers when out of bed   safety round/check completed     Problem: Health Knowledge, Opportunity to Enhance (Adult,Obstetrics,Pediatric)  Goal: Knowledgeable about Health Subject/Topic  Description: Patient will demonstrate the desired outcomes by discharge/transition of care.  Outcome: Ongoing (see interventions/notes)  Intervention: Enhance Health Knowledge  Recent Flowsheet Documentation  Taken 06/28/2024 9071 by Annabella HERO, RN  Family/Support System Care: self-care encouraged     Problem: Gas Exchange Impaired  Goal: Optimal Gas Exchange  Outcome: Ongoing (see interventions/notes)

## 2024-06-28 NOTE — PT Treatment (Signed)
 [facility] Medicine [facility]  [address]  [place], [address]  [phone]  (Fax) [phone]  Rehabilitation Department  Physical Therapy Daily Inpatient Note    Date: 06/28/2024  Patient's Name: Misty Johnson  Date of Birth: [birth year]  Height: Height: 162.6 cm (5' 4.02)  Weight: Weight: 73.5 kg (162 lb)      Plan: Will continue under current POC.      Discharge Disposition: (P) inpatient rehabilitation facility, TBD        Subjective/Objective/Assessment:  Flowsheet    06/28/24 0947   Rehab Session   Document Type therapy progress note (daily note)   PT Visit Date 06/28/24   General Information   Patient Profile Reviewed yes   Medical Lines Telemetry;PIV Line   Respiratory Status high flow nasal cannula   Existing Precautions/Restrictions fall precautions;full code   Pre Treatment Status   Pre Treatment Patient Status Nurse approved session;Patient supine in bed   Pre- Treatment Vital Signs   Pre-Treatment Heart Rate (beats/min) 95   Pre SpO2 (%) 95   O2 Delivery Pre Treatment supplemental O2   Vitals Comment HFNC   Bed Mobility   Supine-Sit [place] stand-by assistance   Sit to Supine, Independence stand-by assistance   Bed Mobility, Assistive Device bed rails   Transfer Assessment/Treatment   Sit-Stand Independence contact guard assist   Stand-Sit Independence contact guard assist   Sit-Stand-Sit, Assist Device walker, front wheeled   Transfer Impairments endurance;strength decreased   Transfer Comment dyspnea upon SOB   Gait Assessment/Treatment   Total Distance Ambulated 2   Independence  contact guard assist   Assistive Device  walker, front wheeled   Impairments  endurance;strength decreased   Comment distance limited by pt tethered to airvo   Balance   Sitting Balance: Static fair + balance   Sitting Balance: Dynamic fair - balance   Post Treatment Status   Post Treatment Patient Status Patient sitting in bedside chair or w/c;Call light within reach;Telephone within  reach;Patient safety alarm activated   Patient Effort adequate   Post-Treatment Vital Signs   Post SpO2 (%) 95   Cognitive Assessment/Intervention   Behavior/Mood Observations behavior appropriate to situation, WNL/WFL   Physical Therapy Time and Intention   Total PT Minutes: 10   Therapy Plan Review/Discharge Plan (PT)   Anticipated Discharge Disposition inpatient rehabilitation facility;TBD       Pt tolerates mobility tasks fairly well but limited due to tethered to HFNC. DOE.         Goals:     all bed mobility activities  stand-by assistance  bed rails    stand-by assistance  walker, rolling, least restricted assistive device  100 feet  portable O2 and chair to follow as needed    Patient will achieve strength BLE to 4+/5 to aid transfers and gait.    sit-to-stand/stand-to-sit, bed-to-chair/chair-to-bed, toilet  minimum assist (75% patient effort)  walker, rolling, least restrictive assistive device                     Intervention minutes: THERAPEUTIC ACTIVITY 10 minutes    THERAPIST  Travus Oren, PTA  06/28/2024, 12:31

## 2024-06-28 NOTE — Respiratory Therapy (Signed)
 06/28/24 0134   Breath Sounds   L General Breath Sounds Diminished   R General Breath Sounds Diminished   Throughout All Lung Fields All Fields   Aerosol Therapy (SVN)   Start Time 0134   Treatment Status Given   Daily Review of Necessity (SVN) completed   Respiratory Treatment Status (SVN) given   $ Respiratory Treatment (Resp only) Neb (Sub)   Medications DuoNeb (albuterol -atrovent)   Patient Position HOB elevated   Posttreatment Assessment (SVN) breath sounds unchanged   Signs of Intolerance (SVN) none   Respiratory Pre/Post-Treatment Assess   Pre-Treatment Heart Rate (beats/min) 78   Pre-Treatment Resp Rate (breaths/min) 18   Post-treatment Heart Rate (beats/min) 78   Post-treatment Resp Rate (breaths/min) 18   Device (Oxygen  Therapy) high-flow nasal cannula;heated;humidified   Flow (L/min) (Oxygen  Therapy) 40   Oxygen  Concentration (%) 45   Breath Sounds Post-Respiratory Treatment   Throughout All Fields Post Treatment All Fields   Breath Sounds Posttreatment All Fields no change   RT Oxygen  Therapy   Start Time 0137   Orders Updated in EMR Yes   Oxygen  Check/Change Changed To   $ O2 Delivery UHFNC   Proximal  Temperature 35 C (95 F)   Flow (L/min) (RT) 40   FiO2 (RT) 45 %   Oxygen  Concentration (%) 45   Stop Time 0137   Duration 0 Minutes   $Pulse Ox Frequency Oximetery Single Determination   High Flow Nasal Cannula   Start Time 0137   Order Updated in EMR Yes   Equipment Adult   HFNC Device Hamilton C1   HFNC Status Placed On HFNC   Facial Skin Integrity WNL   H2O Bottle Check (HFNC) Checked   Circuit Checked   Temperature 34 C (93.2 F)   Flow  40 LPM   FiO2 45 %   SpO2 94   $HFNC Subsequent Charge (Resp only) HFNC-Sub   $Type of Circuit Changed (Resp only) HFNC   Stop Time 0137   Duration 0 Minutes     Pt had to be placed back on HFNC due to low SPO2

## 2024-06-28 NOTE — Nurses Notes (Signed)
 Pt's SPO2 dropped down to 70's at 3 LPM via nasal cannula. Pt had audible wheezes and was short of breath. O2 increased to 4 LPM via NC, notified RT for PRN breathing treatment. Titrated O2 to 40 LPM at 40% via HFNC post breathing treatment, by RT.

## 2024-06-28 NOTE — Respiratory Therapy (Signed)
 06/28/24 0000   Non-Invasive Ventilation Assessment   Start Time 0058   Orders Updated in EMR Yes   $ NIV NIV - subsequent   NIV Status Patient Refused    $ O2 Delivery NC

## 2024-06-29 LAB — COMPREHENSIVE METABOLIC PANEL, NON-FASTING
ALBUMIN/GLOBULIN RATIO: 1.7 — ABNORMAL HIGH (ref 0.8–1.4)
ALBUMIN: 3.3 g/dL — ABNORMAL LOW (ref 3.5–5.7)
ALKALINE PHOSPHATASE: 42 U/L (ref 34–104)
ALT (SGPT): 7 U/L (ref 7–52)
ANION GAP: 3 mmol/L — ABNORMAL LOW (ref 4–13)
AST (SGOT): 12 U/L — ABNORMAL LOW (ref 13–39)
BILIRUBIN TOTAL: 0.9 mg/dL (ref 0.3–1.0)
BUN/CREA RATIO: 22 (ref 6–22)
BUN: 16 mg/dL (ref 7–25)
CALCIUM, CORRECTED: 8.9 mg/dL (ref 8.9–10.8)
CALCIUM: 8.3 mg/dL — ABNORMAL LOW (ref 8.6–10.3)
CHLORIDE: 102 mmol/L (ref 98–107)
CO2 TOTAL: 35 mmol/L — ABNORMAL HIGH (ref 21–31)
CREATININE: 0.74 mg/dL (ref 0.60–1.30)
ESTIMATED GFR: 84 mL/min/1.73mˆ2 (ref >59)
GLOBULIN: 2 (ref 2.0–3.5)
GLUCOSE: 87 mg/dL (ref 74–109)
OSMOLALITY, CALCULATED: 280 mosm/kg (ref 270–290)
POTASSIUM: 4 mmol/L (ref 3.5–5.1)
PROTEIN TOTAL: 5.3 g/dL — ABNORMAL LOW (ref 6.4–8.9)
SODIUM: 140 mmol/L (ref 136–145)

## 2024-06-29 LAB — CBC
HCT: 26.9 % — ABNORMAL LOW (ref 31.2–41.9)
HGB: 8.8 g/dL — ABNORMAL LOW (ref 10.9–14.3)
MCH: 25.7 pg (ref 24.7–32.8)
MCHC: 32.6 g/dL (ref 32.3–35.6)
MCV: 78.8 fL (ref 75.5–95.3)
MPV: 9.9 fL (ref 7.9–10.8)
PLATELETS: 119 x10ˆ3/uL — ABNORMAL LOW (ref 140–440)
RBC: 3.41 x10ˆ6/uL — ABNORMAL LOW (ref 3.63–4.92)
RDW: 18.7 % — ABNORMAL HIGH (ref 12.3–17.7)
WBC: 5.8 x10ˆ3/uL (ref 3.8–11.8)

## 2024-06-29 LAB — MAGNESIUM: MAGNESIUM: 2.1 mg/dL (ref 1.9–2.7)

## 2024-06-29 MED ORDER — FUROSEMIDE 40 MG TABLET: 40.0000 mg | ORAL_TABLET | Freq: Every day | ORAL | 0 refills | Status: AC

## 2024-06-29 MED ORDER — PREDNISONE 10 MG TABLET: ORAL_TABLET | ORAL | 0 refills | Status: DC

## 2024-06-29 MED ORDER — GUAIFENESIN ER 600 MG TABLET, EXTENDED RELEASE 12 HR: 600.0000 mg | EXTENDED_RELEASE_TABLET | Freq: Two times a day (BID) | ORAL | 0 refills | Status: AC

## 2024-06-29 MED ORDER — GUAIFENESIN ER 600 MG TABLET, EXTENDED RELEASE 12 HR: 600.0000 mg | EXTENDED_RELEASE_TABLET | Freq: Two times a day (BID) | ORAL | 0 refills | Status: DC

## 2024-06-29 MED ORDER — BUDESONIDE 0.5 MG/2 ML SUSPENSION FOR NEBULIZATION: 1.0000 mg | INHALATION_SUSPENSION | Freq: Two times a day (BID) | RESPIRATORY_TRACT | 0 refills | Status: DC

## 2024-06-29 MED ORDER — DOXYCYCLINE HYCLATE 100 MG TABLET: 100.0000 mg | ORAL_TABLET | Freq: Two times a day (BID) | ORAL | 0 refills | Status: DC

## 2024-06-29 MED ORDER — BUDESONIDE 0.5 MG/2 ML SUSPENSION FOR NEBULIZATION: 1.0000 mg | INHALATION_SUSPENSION | Freq: Two times a day (BID) | RESPIRATORY_TRACT | 0 refills | Status: AC

## 2024-06-29 MED ORDER — IPRATROPIUM 0.5 MG-ALBUTEROL 3 MG (2.5 MG BASE)/3 ML NEBULIZATION SOLN: 3.0000 mL | INHALATION_SOLUTION | Freq: Four times a day (QID) | RESPIRATORY_TRACT | 0 refills | Status: AC

## 2024-06-29 MED ORDER — AMOXICILLIN 875 MG-POTASSIUM CLAVULANATE 125 MG TABLET: 1.0000 | ORAL_TABLET | Freq: Two times a day (BID) | ORAL | 0 refills | Status: DC

## 2024-06-29 MED ORDER — PANTOPRAZOLE 40 MG TABLET,DELAYED RELEASE: 40.0000 mg | DELAYED_RELEASE_TABLET | Freq: Every day | ORAL | 0 refills | Status: AC

## 2024-06-29 NOTE — Respiratory Therapy (Signed)
 06/29/24 0112   Non-Invasive Ventilation Assessment   Start Time 0112   Orders Updated in EMR Yes   $ NIV NIV - subsequent   NIV Status Patient Refused

## 2024-06-29 NOTE — Discharge Summary (Signed)
 [facility]  DISCHARGE SUMMARY    PATIENT NAME:  Misty Johnson, Misty Johnson  MRN:  [id number]  DOB:  1951/04/02    ENCOUNTER DATE:  06/21/2024  INPATIENT ADMISSION DATE: 06/21/2024  DISCHARGE DATE:  06/29/2024    ATTENDING PHYSICIAN: Twanna Labrum, MD  SERVICE: PRN HOSPITALIST 4  PRIMARY CARE PHYSICIAN: Ashley Darago, APRN, CNP       No lay caregiver identified.    PRIMARY DISCHARGE DIAGNOSIS: Pneumonia  Active Hospital Problems    Diagnosis Date Noted    Principal Problem: Pneumonia [J18.9] 06/21/2024    History of chronic obstructive pulmonary disease [Z87.09] 06/25/2024    Anemia, iron  deficiency [D50.9] 06/21/2024    Anemia [D64.9] 06/21/2024    COPD (chronic obstructive pulmonary disease) [J44.9] 02/15/2024      Resolved Hospital Problems   No resolved problems to display.     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        Details   amoxicillin -pot clavulanate 875-125 mg Tablet  Commonly known as: AUGMENTIN    1 Tablet, Oral, 2 TIMES DAILY  Qty: 6 Tablet  Refills: 0     budesonide  0.5 mg/2 mL nebulizer suspension  Commonly known as: PULMICORT  RESPULES   1 mg, Nebulization, 2 TIMES DAILY  Qty: 112 mL  Refills: 0     doxycycline  hyclate 100 mg Tablet   100 mg, Oral, 2 TIMES DAILY  Qty: 10 Tablet  Refills: 0     guaiFENesin  600 mg Tablet Extended Release 12hr  Commonly known as: MUCINEX    600 mg, Oral, 2 TIMES DAILY  Qty: 28 Tablet  Refills: 0     predniSONE  10 mg  Tablet  Commonly known as: DELTASONE   Start taking on: June 29, 2024   Take 2 Tablets (20 mg total) by mouth Daily for 3 days, THEN 1 Tablet (10 mg total) Daily for 2 days, THEN 0.5 Tablets (5 mg total) Daily for 2 days.  Qty: 9 Tablet  Refills: 0            CONTINUE these medications - NO CHANGES were made during your visit.        Details   atorvastatin  40 mg Tablet  Commonly known as: LIPITOR   40 mg, EVERY EVENING  Refills: 0     ergocalciferol (vitamin D2) 1,250 mcg (50,000 unit) Capsule  Commonly known as: DRISDOL   50,000 Units, Oral, EVERY 7 DAYS, Saturday  Refills: 0     escitalopram  oxalate 20 mg Tablet  Commonly known as: LEXAPRO    20 mg, Daily  Refills: 0     furosemide  40 mg Tablet  Commonly known as: LASIX    40 mg, Oral, Daily  Qty: 90 Tablet  Refills: 0     ipratropium-albuteroL  0.5 mg-3 mg(2.5 mg base)/3 mL nebulizer solution  Commonly known as: DUONEB   3 mL, Nebulization, 4 TIMES DAILY  Qty: 120  Each  Refills: 0     Levocetirizine 5 mg Tablet  Commonly known as: XYZAL   5 mg, EVERY EVENING  Refills: 0     metoprolol  succinate 25 mg Tablet Sustained Release 24 hr  Commonly known as: TOPROL -XL   25 mg, Daily  Refills: 0     omeprazole 20 mg Capsule, Delayed Release(E.C.)  Commonly known as: PRILOSEC   20 mg, Daily  Refills: 0     OXYGEN -AIR DELIVERY SYSTEMS DEACT   3L NC at home  Refills: 0     potassium chloride  20 mEq Tab Sust.Rel. Particle/Crystal  Commonly known as: K-DUR   20 mEq, Daily  Refills: 0     Trelegy Ellipta 100-62.5-25 mcg Disk with Device  Generic drug: fluticasone -umeclidin-vilanter   1 Inhalation, Daily  Refills: 0     Yupelri  175 mcg/3 mL nebulizer solution  Generic drug: revefenacin    175 mcg, DAILY  Refills: 0            STOP taking these medications.      albuterol  sulfate 2.5 mg /3 mL (0.083 %) nebulizer solution  Commonly known as: PROVENTIL      ondansetron  4 mg Tablet, Rapid Dissolve  Commonly known as: ZOFRAN  ODT     polyethylene glycol 17 gram Powder in  Packet  Commonly known as: MIRALAX             Discharge med list refreshed?  YES     Allergies[1]  HOSPITAL PROCEDURE(S):   No orders of the defined types were placed in this encounter.    Surgical/Procedural Cases on this Admission       Case IDs Date Procedure Surgeon Location Status    [id number] 06/24/24 EGD Tobie Hitt, MD PRN OR T&D Comp          REASON FOR HOSPITALIZATION AND HOSPITAL COURSE   BRIEF HPI:  This is a 73 y.o., female admitted for bilateral pneumonia with a acute hypoxic respiratory failure and melena.  BRIEF HOSPITAL NARRATIVE:     This is a 73 year old female who presented to the emergency room with complaints of shortness of breath and black stools.  She has a history of Chronic Obstructive Pulmonary Disease on home oxygen .  Chest x-ray shows bilateral atelectasis versus pneumonia. She was treated with broad-spectrum antibiotics, IV steroids, and neb treatments. She did require high-flow nasal cannula upon admission.  Oxygen  was weaned to baseline O2 3 L NC.  Fecal occult blood was negative.  GI was consulted for melena.  She had an EGD on 06/24/2024 that showed moderate distal esophagitis, small hiatal hernia, mild gastritis.  No ulcers or active bleeding seen.  Is to continue Protonix  40 mg daily (increase omeprazole to 40 mg daily at home).  Maintain anti-reflux precautions.  She will need to have an elective colonoscopy on an outpatient basis once pneumonia has resolved.  She is to follow up outpatient with GI in 2-3 weeks of discharge.  Physical therapy occupational therapy followed her throughout her stay.  Patient was recommended to go to inpatient rehab/skilled nursing facility for rehab placement however patient refused.  She is medically stable to be discharged home with home health services today.  She is to continue home oxygen  and neb treatments.  She is to follow up with the primary care provider in 1 week.    Physical Exam  Vitals reviewed.   HENT:      Head: Normocephalic and  atraumatic.      Mouth/Throat:  Mouth: Mucous membranes are moist.      Pharynx: Oropharynx is clear.   Cardiovascular:      Rate and Rhythm: Normal rate and regular rhythm.   Pulmonary:      Effort: Pulmonary effort is normal.      Breath sounds: Normal breath sounds.      Comments: 3 L NC  Abdominal:      General: Bowel sounds are normal.      Palpations: Abdomen is soft.   Skin:     General: Skin is warm and dry.   Neurological:      Mental Status: She is alert and oriented to person, place, and time.            TRANSITION/POST DISCHARGE CARE/PENDING TESTS/REFERRALS:     - Augmentin  875-125 mg twice daily  -  Doxycycline  100 mg twice daily  -  Prednisone  20 mg daily  -  Pulmicort  Respules twice daily  -  Mucinex  600 mg twice daily  -  Follow up PCP 1 week  -  Follow up GI 2-3 weeks.  We will need outpatient colonoscopy once recovered from pneumonia.    CONDITION ON DISCHARGE:  A. Ambulation: Ambulation with assistive device  B. Self-care Ability: With partial assistance  C. Cognitive Status Alert and Oriented x 3  D. Code status at discharge:       LINES/DRAINS/WOUNDS AT DISCHARGE:   Patient Lines/Drains/Airways Status       Active Line / Dialysis Catheter / Dialysis Graft / Drain / Airway / Wound       None                    DISCHARGE DISPOSITION:  Home discharge and Home Health  DISCHARGE INSTRUCTIONS:  Post-Discharge Follow Up Appointments       Follow up with Andra Pastor, MD    Phone: [phone]    Where: [address], [place] [address]    Follow up with Tobie Hitt, MD    Phone: [phone]    Where: [address], [place] [address]      Monday Aug 12, 2024    New Patient Visit with Ezzard Shuck, APRN, CNP at  8:30 AM      Pulmonary and Sleep Disorders, [facility], [place]   [address]  [place] [address]  [phone]          No discharge procedures on file.       Verneita Montclair, APRN, CNP    Copies sent to Care Team          Relationship Specialty Notifications Start End    Darago, Ashley, APRN, CNP PCP - General FAMILY NURSE PRACTITIONER Admissions 06/24/24     Phone: [phone] Fax: [phone]         [address]  [place] [address]            Referring providers can utilize [email or web address] to access their referred [facility] patient's information.                               [1]   Allergies  Allergen Reactions    Hydrocodone  Nausea/ Vomiting

## 2024-06-29 NOTE — Care Plan (Signed)
 Pt's sleep was on and off throughout the night. She is alert and oriented. O2 continuous at 2 LPM via nasal cannula. Antibiotics as ordered. External catheter in use. Tele and pulse ox monitoring continuous. Fall precautions maintained. Call bell within reach.    Problem: Pneumonia  Goal: Effective Oxygenation and Ventilation  Outcome: Ongoing (see interventions/notes)  Intervention: Optimize Oxygenation and Ventilation  Recent Flowsheet Documentation  Taken 06/28/2024 2000 by Maurie HERO, RN  Head of Bed [facility]) Positioning: HOB at 30-45 degrees   O2 administration and titration as needed. Encourage incentive spirometry exercise.

## 2024-06-29 NOTE — Nurses Notes (Signed)
 Pt is alert and oriented, using 2 liters O2 nasal cannula, O2 sat WNL however pt does become short of breath when exerting. Pt requesting to be discharged, Provider at the bedside advising patient. Patient asked to increase her activity as tolerated, Pt did verbalize that she rarely leaves her home and would like to get back to it ASAP.Medications as well as follow up discussed with the patient and written instructions were given.

## 2024-06-29 NOTE — Nurses Notes (Signed)
 Pt ambulated in the hallway around 2 west, pt using two liters of O2 via nasal cannula. Pt O2 sat decreased to 83%, oxygen  increased to 3 liters, O2 sat improved to 90% while ambulating. Pt felt tired, frequent rests, pt encouraged to utilize rehabilitation services, patient declined.

## 2024-07-02 NOTE — Care Management Notes (Signed)
 07/02/24 9:06 am:  Patient was discharged home on 06/29/24.  Received orders for home health.  Attempted to reach patient at home but she did not answer.  Spoke with daughter, Nyimah Shadduck, by phone and she advised that patient has had [facility] in the past and they would like to use them again.  A referral was sent to [facility] this morning.  They will contact patient at home to set up a time to begin services.

## 2024-07-07 ENCOUNTER — Emergency Department (HOSPITAL_BASED_OUTPATIENT_CLINIC_OR_DEPARTMENT_OTHER): Payer: Medicare (Managed Care)

## 2024-07-07 ENCOUNTER — Observation Stay
Admission: EM | Admit: 2024-07-07 | Discharge: 2024-07-09 | Disposition: A | Discharge status: Home / Self Care | Payer: Medicare (Managed Care) | Attending: Internal Medicine | Admitting: Internal Medicine

## 2024-07-07 ENCOUNTER — Encounter (HOSPITAL_BASED_OUTPATIENT_CLINIC_OR_DEPARTMENT_OTHER): Payer: Self-pay

## 2024-07-07 ENCOUNTER — Other Ambulatory Visit: Payer: Self-pay

## 2024-07-07 DIAGNOSIS — F17200 Nicotine dependence, unspecified, uncomplicated: Secondary | ICD-10-CM | POA: Diagnosis present

## 2024-07-07 DIAGNOSIS — E785 Hyperlipidemia, unspecified: Secondary | ICD-10-CM | POA: Insufficient documentation

## 2024-07-07 DIAGNOSIS — J9611 Chronic respiratory failure with hypoxia: Secondary | ICD-10-CM | POA: Insufficient documentation

## 2024-07-07 DIAGNOSIS — F17210 Nicotine dependence, cigarettes, uncomplicated: Secondary | ICD-10-CM | POA: Insufficient documentation

## 2024-07-07 DIAGNOSIS — R0602 Shortness of breath: Secondary | ICD-10-CM

## 2024-07-07 DIAGNOSIS — I509 Heart failure, unspecified: Secondary | ICD-10-CM | POA: Insufficient documentation

## 2024-07-07 DIAGNOSIS — I422 Other hypertrophic cardiomyopathy: Secondary | ICD-10-CM | POA: Insufficient documentation

## 2024-07-07 DIAGNOSIS — R7989 Other specified abnormal findings of blood chemistry: Secondary | ICD-10-CM

## 2024-07-07 DIAGNOSIS — Z79899 Other long term (current) drug therapy: Secondary | ICD-10-CM | POA: Insufficient documentation

## 2024-07-07 DIAGNOSIS — R079 Chest pain, unspecified: Secondary | ICD-10-CM

## 2024-07-07 DIAGNOSIS — J439 Emphysema, unspecified: Secondary | ICD-10-CM | POA: Insufficient documentation

## 2024-07-07 DIAGNOSIS — Z716 Tobacco abuse counseling: Secondary | ICD-10-CM | POA: Insufficient documentation

## 2024-07-07 DIAGNOSIS — Z9981 Dependence on supplemental oxygen: Secondary | ICD-10-CM | POA: Insufficient documentation

## 2024-07-07 DIAGNOSIS — Z8701 Personal history of pneumonia (recurrent): Secondary | ICD-10-CM | POA: Insufficient documentation

## 2024-07-07 DIAGNOSIS — I517 Cardiomegaly: Secondary | ICD-10-CM | POA: Insufficient documentation

## 2024-07-07 DIAGNOSIS — R9431 Abnormal electrocardiogram [ECG] [EKG]: Secondary | ICD-10-CM | POA: Insufficient documentation

## 2024-07-07 DIAGNOSIS — R109 Unspecified abdominal pain: Secondary | ICD-10-CM | POA: Insufficient documentation

## 2024-07-07 DIAGNOSIS — J441 Chronic obstructive pulmonary disease with (acute) exacerbation: Principal | ICD-10-CM | POA: Insufficient documentation

## 2024-07-07 DIAGNOSIS — I272 Pulmonary hypertension, unspecified: Secondary | ICD-10-CM | POA: Insufficient documentation

## 2024-07-07 DIAGNOSIS — R112 Nausea with vomiting, unspecified: Principal | ICD-10-CM | POA: Insufficient documentation

## 2024-07-07 DIAGNOSIS — F32A Depression, unspecified: Secondary | ICD-10-CM | POA: Insufficient documentation

## 2024-07-07 DIAGNOSIS — I2510 Atherosclerotic heart disease of native coronary artery without angina pectoris: Secondary | ICD-10-CM | POA: Insufficient documentation

## 2024-07-07 DIAGNOSIS — R197 Diarrhea, unspecified: Secondary | ICD-10-CM | POA: Insufficient documentation

## 2024-07-07 DIAGNOSIS — R0902 Hypoxemia: Secondary | ICD-10-CM

## 2024-07-07 LAB — TROPONIN-I
TROPONIN I: 15 ng/L — ABNORMAL HIGH (ref <15)
TROPONIN I: 15 ng/L — ABNORMAL HIGH (ref <15)
TROPONIN I: 16 ng/L — ABNORMAL HIGH (ref <15)

## 2024-07-07 LAB — CBC WITH DIFF
BASOPHIL #: 0.04 x10ˆ3/uL (ref 0.00–0.10)
BASOPHIL %: 0 % (ref 0–1)
EOSINOPHIL #: 0.06 x10ˆ3/uL (ref 0.00–0.50)
EOSINOPHIL %: 1 % (ref 1–7)
HCT: 42.3 % — ABNORMAL HIGH (ref 31.2–41.9)
HGB: 12.5 g/dL (ref 10.9–14.3)
LYMPHOCYTE #: 0.81 x10ˆ3/uL — ABNORMAL LOW (ref 1.10–3.10)
LYMPHOCYTE %: 8 % — ABNORMAL LOW (ref 16–46)
MCH: 26.2 pg (ref 24.7–32.8)
MCHC: 29.6 g/dL — ABNORMAL LOW (ref 32.3–35.6)
MCV: 88.4 fL (ref 75.5–95.3)
MONOCYTE #: 0.67 x10ˆ3/uL (ref 0.20–0.90)
MONOCYTE %: 7 % (ref 4–11)
MPV: 10.8 fL (ref 7.9–10.8)
NEUTROPHIL #: 8.41 x10ˆ3/uL — ABNORMAL HIGH (ref 1.90–8.20)
NEUTROPHIL %: 84 % — ABNORMAL HIGH (ref 43–77)
PLATELETS: 163 x10ˆ3/uL (ref 140–440)
RBC: 4.78 x10ˆ6/uL (ref 3.63–4.92)
RDW: 23.1 % — ABNORMAL HIGH (ref 12.3–17.7)
WBC: 10 x10ˆ3/uL (ref 3.8–11.8)

## 2024-07-07 LAB — NT-PROBNP: NT-PROBNP: 3153 pg/mL — ABNORMAL HIGH (ref <=450)

## 2024-07-07 LAB — COMPREHENSIVE METABOLIC PANEL, NON-FASTING
ALBUMIN/GLOBULIN RATIO: 1.1 (ref 0.8–1.4)
ALBUMIN: 3.8 g/dL (ref 3.4–5.0)
ALKALINE PHOSPHATASE: 84 U/L (ref 46–116)
ALT (SGPT): 18 U/L (ref <=78)
ANION GAP: 12 mmol/L (ref 4–13)
AST (SGOT): 28 U/L (ref 15–37)
BILIRUBIN TOTAL: 0.9 mg/dL (ref 0.2–1.0)
BUN/CREA RATIO: 22
BUN: 25 mg/dL — ABNORMAL HIGH (ref 7–18)
CALCIUM, CORRECTED: 9.9 mg/dL
CALCIUM: 9.7 mg/dL (ref 8.5–10.1)
CHLORIDE: 103 mmol/L (ref 98–107)
CO2 TOTAL: 31 mmol/L (ref 21–32)
CREATININE: 1.16 mg/dL — ABNORMAL HIGH (ref 0.55–1.02)
ESTIMATED GFR: 49 mL/min/1.73mˆ2 — ABNORMAL LOW (ref >59)
GLOBULIN: 3.4
GLUCOSE: 132 mg/dL — ABNORMAL HIGH (ref 74–106)
OSMOLALITY, CALCULATED: 297 mosm/kg — ABNORMAL HIGH (ref 270–290)
POTASSIUM: 3.9 mmol/L (ref 3.5–5.1)
PROTEIN TOTAL: 7.2 g/dL (ref 6.4–8.2)
SODIUM: 146 mmol/L — ABNORMAL HIGH (ref 136–145)

## 2024-07-07 LAB — URINALYSIS, MACRO/MICRO
BILIRUBIN: NEGATIVE mg/dL
GLUCOSE: NEGATIVE mg/dL
KETONES: NEGATIVE mg/dL
LEUKOCYTES: NEGATIVE WBCs/uL
NITRITE: NEGATIVE
PH: 6 (ref 4.6–8.0)
PROTEIN: NEGATIVE mg/dL
SPECIFIC GRAVITY: <=1.005 (ref 1.003–1.035)
UROBILINOGEN: 0.2 mg/dL (ref 0.2–1.0)

## 2024-07-07 LAB — COVID-19, FLU A/B, RSV RAPID BY PCR
INFLUENZA VIRUS TYPE A: NOT DETECTED
INFLUENZA VIRUS TYPE B: NOT DETECTED
RESPIRATORY SYNCYTIAL VIRUS (RSV): NOT DETECTED
SARS-CoV-2: NOT DETECTED

## 2024-07-07 LAB — MAGNESIUM: MAGNESIUM: 2.3 mg/dL (ref 1.8–2.4)

## 2024-07-07 LAB — BLOOD GAS
%FIO2 (ARTERIAL): 28 %
BASE EXCESS (ARTERIAL): 6.2 mmol/L — ABNORMAL HIGH (ref 0.0–3.0)
BICARBONATE (ARTERIAL): 29.7 mmol/L — ABNORMAL HIGH (ref 21.0–28.0)
O2 SATURATION (ARTERIAL): 96.5 % (ref 94.0–98.0)
PAO2/FIO2 RATIO: 286
PCO2 (ARTERIAL): 42 mmHg (ref 35–45)
PH (ARTERIAL): 7.47 — ABNORMAL HIGH (ref 7.35–7.45)
PO2 (ARTERIAL): 80 mmHg — ABNORMAL LOW (ref 83–108)

## 2024-07-07 LAB — D-DIMER: D-DIMER: 1992 ng{FEU}/mL (ref 215–500)

## 2024-07-07 LAB — PTT (PARTIAL THROMBOPLASTIN TIME): APTT: 20.8 s — ABNORMAL LOW (ref 25.0–38.0)

## 2024-07-07 LAB — PT/INR
INR: 1.04 (ref 0.84–1.10)
PROTHROMBIN TIME: 11.8 s (ref 9.8–12.7)

## 2024-07-07 LAB — URINALYSIS, MICROSCOPIC

## 2024-07-07 LAB — LIPASE: LIPASE: 73 U/L (ref 15–77)

## 2024-07-07 MED ORDER — IOHEXOL 350 MG IODINE/ML INTRAVENOUS SOLUTION: 100.0000 mL | INTRAVENOUS | Status: AC

## 2024-07-07 MED ORDER — SODIUM CHLORIDE 0.9 % IV BOLUS: 1000.0000 mL | INJECTION | Status: AC

## 2024-07-07 MED ORDER — ONDANSETRON HCL (PF) 4 MG/2 ML INJECTION SOLUTION: 4.0000 mg | INTRAMUSCULAR | Status: AC

## 2024-07-07 MED ORDER — METHYLPREDNISOLONE SOD SUCC 125 MG SOLUTION FOR INJECTION WRAPPER
INTRAVENOUS | Status: AC
  Filled 2024-07-07: qty 2

## 2024-07-07 MED ORDER — PANTOPRAZOLE 40 MG INTRAVENOUS SOLUTION
INTRAVENOUS | Status: AC
  Filled 2024-07-07: qty 10

## 2024-07-07 MED ORDER — ACETAMINOPHEN 1,000 MG/100 ML (10 MG/ML) INTRAVENOUS SOLUTION
1000.0000 mg | INTRAVENOUS | Status: AC
  Administered 2024-07-07: 1000 mg via INTRAVENOUS

## 2024-07-07 MED ORDER — ONDANSETRON HCL (PF) 4 MG/2 ML INJECTION SOLUTION
INTRAMUSCULAR | Status: AC
  Filled 2024-07-07: qty 2

## 2024-07-07 MED ORDER — METHYLPREDNISOLONE SOD SUCC 125 MG SOLUTION FOR INJECTION WRAPPER: 125.0000 mg | INTRAVENOUS | Status: AC

## 2024-07-07 MED ORDER — ACETAMINOPHEN 1,000 MG/100 ML (10 MG/ML) INTRAVENOUS SOLUTION
INTRAVENOUS | Status: AC
  Filled 2024-07-07: qty 100

## 2024-07-07 MED ORDER — PANTOPRAZOLE 40 MG INTRAVENOUS SOLUTION: 40.0000 mg | INTRAVENOUS | Status: AC

## 2024-07-07 MED ORDER — IPRATROPIUM 0.5 MG-ALBUTEROL 3 MG (2.5 MG BASE)/3 ML NEBULIZATION SOLN: 3.0000 mL | INHALATION_SOLUTION | RESPIRATORY_TRACT | Status: AC

## 2024-07-07 MED ORDER — IPRATROPIUM 0.5 MG-ALBUTEROL 3 MG (2.5 MG BASE)/3 ML NEBULIZATION SOLN
INHALATION_SOLUTION | RESPIRATORY_TRACT | Status: AC
  Filled 2024-07-07: qty 3

## 2024-07-07 NOTE — ED Nurses Note (Signed)
 Pt states she is feeling better. Pt states she has not vomited or had diarrhea. Denies any wants or needs at this time. No s/s of acute distress noted at this time.

## 2024-07-07 NOTE — Ancillary Notes (Signed)
 Latest Reference Range & Units 07/07/24 17:41   %FIO2 % 28   PH 7.35 - 7.45  7.47 (H)   PCO2 35 - 45 mm/Hg 42   PO2 83 - 108 mm/Hg 80 (L)   BICARBONATE 21.0 - 28.0 mmol/L 29.7 (H)   BASE EXCESS 0.0 - 3.0 mmol/L 6.2 (H)   PAO2/FIO2 RATIO  286   O2 SATURATION (ARTERIAL) 94.0 - 98.0 % 96.5   (H): Data is abnormally high  (L): Data is abnormally low    2 L/M N.C., PT. STATED She WEARS 2 L/M ALL THE TIME...the patient. HAD HEMATOMA RRAD PRIOR TO THIS ABG, PT.S NURSE AWARE.

## 2024-07-07 NOTE — ED Provider Notes (Signed)
 [facility], [place] - Emergency Department  ED Primary Note  History of Present Illness   Misty Johnson is a 74 y.o. female who had concerns including Vomiting. Pt states was admitted 12-19 DC 12-27 with pneumonia. Pt states she has mid abd pain nvd and still having some sob.  Review of Systems   Constitutional: No fever, chills + weakness   Skin: No rash or diaphoresis  HENT: No headaches, or congestion  Eyes: No vision changes or photophobia   Cardio: No chest pain, palpitations or leg swelling   Respiratory:+ cough, wheezing SOB  GI:  + nausea, vomiting abd pain stool changes  GU:  No dysuria, hematuria, or increased frequency  MSK: No muscle aches, joint or back pain  Neuro: No seizures, LOC, numbness, tingling, or focal weakness  Psychiatric: No depression, SI or substance abuse  All other systems reviewed and are negative.      Physical Exam   ED Triage Vitals   BP (Non-Invasive) 07/07/24 1724 (!) 131/58   Heart Rate 07/07/24 1724 76   Respiratory Rate 07/07/24 1722 18   Temperature 07/07/24 1724 36.6 C (97.8 F)   SpO2 07/07/24 1724 100 %   Weight 07/07/24 1722 70.8 kg (156 lb)   Height 07/07/24 1722 1.626 m (5' 4)     Constitutional:  74 y.o. female who appears in no distress. Normal color, no cyanosis.   HENT:   Head: Normocephalic and atraumatic.   Mouth/Throat: Oropharynx is clear and dry .   Eyes: EOMI, PERRL   Neck: Trachea midline. Neck supple.  Cardiovascular: RRR, No murmurs, rubs or gallops. Intact distal pulses.  Pulmonary/Chest: No respiratory distress. + diffuse  wheezes, diminished left base.  rales or chest tenderness.   Abdominal: Bowel sounds present and normal. Abdomen soft, + mid abd mild tenderness, no rebound and no guarding.  Back: No midline spinal tenderness, no paraspinal tenderness, no CVA tenderness.           Musculoskeletal: No edema, tenderness or deformity.  Skin: warm and dry. No rash, erythema, pallor or cyanosis  Psychiatric: normal mood and affect.  Behavior is normal.   Neurological: Patient keenly alert and responsive, easily able to raise eyebrows, facial muscles/expressions symmetric, speaking in fluent sentences, moving all extremities equally and fully, normal gait  Patient Data   Labs Ordered/Reviewed   COMPREHENSIVE METABOLIC PANEL, NON-FASTING - Abnormal; Notable for the following components:       Result Value    SODIUM 146 (*)     BUN 25 (*)     CREATININE 1.16 (*)     ESTIMATED GFR 49 (*)     GLUCOSE 132 (*)     OSMOLALITY, CALCULATED 297 (*)     All other components within normal limits    Narrative:     Estimated Glomerular Filtration Rate (eGFR) is calculated using the CKD-EPI (2021) equation, intended for patients 62 years of age and older. If gender is not documented or unknown, there will be no eGFR calculation.   PTT (PARTIAL THROMBOPLASTIN TIME) - Abnormal; Notable for the following components:    APTT 20.8 (*)     All other components within normal limits   TROPONIN-I - Abnormal; Notable for the following components:    TROPONIN I 15 (*)     All other components within normal limits    Narrative:     Values received on females ranging between 12-15 ng/L MUST include the next serial troponin to review changes in  the delta differences as the reference range for the Access II chemistry analyzer is lower than the established reference range.     TROPONIN-I - Abnormal; Notable for the following components:    TROPONIN I 15 (*)     All other components within normal limits    Narrative:     Values received on females ranging between 12-15 ng/L MUST include the next serial troponin to review changes in the delta differences as the reference range for the Access II chemistry analyzer is lower than the established reference range.     TROPONIN-I - Abnormal; Notable for the following components:    TROPONIN I 16 (*)     All other components within normal limits    Narrative:     Values received on females ranging between 12-15 ng/L MUST include the  next serial troponin to review changes in the delta differences as the reference range for the Access II chemistry analyzer is lower than the established reference range.     BLOOD GAS - Abnormal; Notable for the following components:    PH (ARTERIAL) 7.47 (*)     PO2 (ARTERIAL) 80 (*)     BICARBONATE (ARTERIAL) 29.7 (*)     BASE EXCESS (ARTERIAL) 6.2 (*)     All other components within normal limits    Narrative:     RRAD  2 L/M N.C.  ALLEN'S TEST PERFORMED  A reference range for Oxygen  Saturation is provided but abnormal results will not flag in Epic.   D-DIMER - Abnormal; Notable for the following components:    D-DIMER 1,992 (*)     All other components within normal limits    Narrative:     D-Dimers are reported in FEU per ng/mL.    IF PATIENT IS EXHIBITING SYMPTOMS DVT/PE, THIS D-DIMER RESULT MAY INDICATE A NEED FOR FURTHER TESTING FOR THESE CONDITIONS. IF PATIENT IS SUSPECTED OF DIC AND SYMPTOMS WORSEN OR PERSIST, A REPEAT DIC WORKUP SHOULD BE CONSIDERED.    NOTE: ALTHOUGH THE NORMAL RANGE FOR THIS TEST IS 215-500 ng/mL FEU, LITERATURE RECOMMENDS FURTHER TESTING FOR ANY RESULT >500ng/mL FEU.    A cut off value of 500ng/mL FEU or below can be used as an aid in the diagnosis of Thromboembolism when used in conjunction with the patient's medical history, clinical presentation and other findings. Results of 500ng/mL FEU or below have a negative predictive value of 100%.     CBC WITH DIFF - Abnormal; Notable for the following components:    HCT 42.3 (*)     MCHC 29.6 (*)     RDW 23.1 (*)     NEUTROPHIL % 84 (*)     LYMPHOCYTE % 8 (*)     NEUTROPHIL # 8.41 (*)     LYMPHOCYTE # 0.81 (*)     All other components within normal limits   URINALYSIS, MACRO/MICRO - Abnormal; Notable for the following components:    BLOOD Trace (*)     All other components within normal limits   URINALYSIS, MICROSCOPIC - Abnormal; Notable for the following components:    BACTERIA Rare (*)     All other components within normal limits    PT/INR - Normal    Narrative:     In the setting of warfarin therapy, a moderate-intensity INR goal range is 2.0 to 3.0 and a high-intensity INR goal range is 2.5 to 3.5.    INR is ONLY validated to determine the level of anticoagulation with vitamin K  antagonists (warfarin). Other factors may elevate the INR including but not limited to direct oral anticoagulants (DOACs), liver dysfunction, vitamin K deficiency, DIC, factor deficiencies, and factor inhibitors.   LIPASE - Normal   MAGNESIUM  - Normal   CBC/DIFF    Narrative:     The following orders were created for panel order CBC/DIFF.  Procedure                               Abnormality         Status                     ---------                               -----------         ------                     CBC WITH IPQQ[[id number]]                Abnormal            Final result                 Please view results for these tests on the individual orders.   URINALYSIS WITH REFLEX MICROSCOPIC AND CULTURE IF POSITIVE    Narrative:     The following orders were created for panel order URINALYSIS WITH REFLEX MICROSCOPIC AND CULTURE IF POSITIVE.  Procedure                               Abnormality         Status                     ---------                               -----------         ------                     URINALYSIS, MACRO/MICRO[[id number]]      Abnormal            Final result               URINALYSIS, MICROSCOPIC[[id number]]      Abnormal            Final result                 Please view results for these tests on the individual orders.   NT-PROBNP   COVID-19, FLU A/B, RSV RAPID BY PCR     CT ANGIO CHEST FOR PULMONARY EMBOLUS W IV CONTRAST   Final Result by Edi, Radresults In (01/04 1938)      No evidence of acute pulmonary embolism or other acute findings in the chest.      Moderate emphysema.      Moderate cardiomegaly.                     Radiologist location ID: [id number]         CT ABDOMEN PELVIS WO IV CONTRAST   Final Result by Edi, Radresults  In (01/04 1836)      No  acute findings in the abdomen or pelvis by noncontrast CT.                     Radiologist location ID: [id number]         XR AP MOBILE CHEST   Final Result by Edi, Radresults In (01/04 1832)   Improved aeration of the left lower lung zone. No new acute findings.                  Radiologist location ID: [id number]           Medical Decision Making   Diff dx of med side effects. Abd pain. Pneumonia dehydration. Viral syndrome. COPD exac.   Pt up to bedside toilet st 81 on 3 liters nc good wave form. Pt has had had no nvd in er. 2132 dr. Loura accepted pt.   Medications Ordered/Administered in the ED   NS bolus infusion 1,000 mL (1,000 mL Intravenous New Bag/New Syringe 07/07/24 1758)   ondansetron  (ZOFRAN ) 2 mg/mL injection (4 mg Intravenous Given 07/07/24 1759)   pantoprazole  (PROTONIX ) 4 mg/mL injection (40 mg Intravenous Given 07/07/24 1758)   methylPREDNISolone  sod succ (SOLU-medrol ) 125 mg/2 mL injection (125 mg Intravenous Given 07/07/24 1758)   acetaminophen  (OFIRMEV ) 1,000 mg (10 mg/mL) IV 100 mL (tot vol) (0 mg Intravenous Stopped 07/07/24 1813)   iohexol  (OMNIPAQUE  350) solution (100 mL Intravenous Given 07/07/24 1910)   ipratropium-albuterol  0.5 mg-3 mg(2.5 mg base)/3 mL Solution for Nebulization (3 mL Nebulization Given 07/07/24 2026)     Clinical Impression   Nausea and vomiting (Primary)   Abdominal cramps   COPD with acute exacerbation (CMS HCC)   History of pneumonia   Hypoxia       Disposition: Admitted

## 2024-07-07 NOTE — ED Nurses Note (Signed)
 D-Dimer of 1992 was reported to [employer] .

## 2024-07-08 DIAGNOSIS — F17210 Nicotine dependence, cigarettes, uncomplicated: Secondary | ICD-10-CM

## 2024-07-08 DIAGNOSIS — J439 Emphysema, unspecified: Secondary | ICD-10-CM

## 2024-07-08 DIAGNOSIS — R197 Diarrhea, unspecified: Secondary | ICD-10-CM

## 2024-07-08 DIAGNOSIS — Z79899 Other long term (current) drug therapy: Secondary | ICD-10-CM

## 2024-07-08 DIAGNOSIS — I4940 Unspecified premature depolarization: Secondary | ICD-10-CM

## 2024-07-08 DIAGNOSIS — R9431 Abnormal electrocardiogram [ECG] [EKG]: Secondary | ICD-10-CM

## 2024-07-08 DIAGNOSIS — J441 Chronic obstructive pulmonary disease with (acute) exacerbation: Principal | ICD-10-CM

## 2024-07-08 DIAGNOSIS — I517 Cardiomegaly: Secondary | ICD-10-CM

## 2024-07-08 DIAGNOSIS — I493 Ventricular premature depolarization: Secondary | ICD-10-CM

## 2024-07-08 DIAGNOSIS — E785 Hyperlipidemia, unspecified: Secondary | ICD-10-CM

## 2024-07-08 DIAGNOSIS — I499 Cardiac arrhythmia, unspecified: Secondary | ICD-10-CM

## 2024-07-08 LAB — SCAN DIFFERENTIAL
PLATELET MORPHOLOGY COMMENT: ADEQUATE
SCHISTOCYTES: ABSENT

## 2024-07-08 LAB — BASIC METABOLIC PANEL
ANION GAP: 7 mmol/L (ref 4–13)
BUN/CREA RATIO: 21 (ref 6–22)
BUN: 19 mg/dL (ref 7–25)
CALCIUM: 8.3 mg/dL — ABNORMAL LOW (ref 8.6–10.3)
CHLORIDE: 101 mmol/L (ref 98–107)
CO2 TOTAL: 28 mmol/L (ref 21–31)
CREATININE: 0.91 mg/dL (ref 0.60–1.30)
ESTIMATED GFR: 66 mL/min/1.73mˆ2 (ref >59)
OSMOLALITY, CALCULATED: 276 mosm/kg (ref 270–290)
POTASSIUM: 3.7 mmol/L (ref 3.5–5.1)
SODIUM: 136 mmol/L (ref 136–145)

## 2024-07-08 LAB — MAGNESIUM: MAGNESIUM: 2 mg/dL (ref 1.9–2.7)

## 2024-07-08 LAB — CBC
HCT: 33.9 % (ref 31.2–41.9)
HGB: 10.8 g/dL — ABNORMAL LOW (ref 10.9–14.3)
MCH: 27.1 pg (ref 24.7–32.8)
MCHC: 32 g/dL — ABNORMAL LOW (ref 32.3–35.6)
MCV: 84.6 fL (ref 75.5–95.3)
MPV: 9.7 fL (ref 7.9–10.8)
PLATELETS: 102 x10ˆ3/uL — ABNORMAL LOW (ref 140–440)
RBC: 4.01 x10ˆ6/uL (ref 3.63–4.92)
RDW: 24.5 % — ABNORMAL HIGH (ref 12.3–17.7)
WBC: 6.1 x10ˆ3/uL (ref 3.8–11.8)

## 2024-07-08 LAB — ECG 12 LEAD
Atrial Rate: 65 {beats}/min
Calculated P Axis: 34 degrees
Calculated R Axis: 32 degrees
Calculated T Axis: 53 degrees
PR Interval: 110 ms
QRS Duration: 92 ms
QT Interval: 426 ms
QTC Calculation: 443 ms
Ventricular rate: 65 {beats}/min

## 2024-07-08 LAB — TROPONIN-I: TROPONIN I: 17 ng/L — ABNORMAL HIGH (ref <15)

## 2024-07-08 LAB — B-TYPE NATRIURETIC PEPTIDE (BNP),PLASMA: BNP: 923 pg/mL — ABNORMAL HIGH (ref 1–100)

## 2024-07-08 MED ORDER — LORATADINE 10 MG TABLET
10.0000 mg | ORAL_TABLET | Freq: Every day | ORAL | Status: DC
  Administered 2024-07-08 – 2024-07-09 (×2): 10 mg via ORAL
  Filled 2024-07-08 (×2): qty 1

## 2024-07-08 MED ORDER — FUROSEMIDE 40 MG TABLET
40.0000 mg | ORAL_TABLET | Freq: Two times a day (BID) | ORAL | Status: DC
  Administered 2024-07-08: 0 mg via ORAL
  Administered 2024-07-08: 40 mg via ORAL
  Administered 2024-07-09: 0 mg via ORAL
  Filled 2024-07-08 (×3): qty 1

## 2024-07-08 MED ORDER — ATORVASTATIN 40 MG TABLET
40.0000 mg | ORAL_TABLET | Freq: Every evening | ORAL | Status: DC
  Administered 2024-07-08: 40 mg via ORAL
  Filled 2024-07-08: qty 1

## 2024-07-08 MED ORDER — PANTOPRAZOLE 40 MG TABLET,DELAYED RELEASE
40.0000 mg | DELAYED_RELEASE_TABLET | Freq: Every day | ORAL | Status: DC
  Administered 2024-07-08 – 2024-07-09 (×2): 40 mg via ORAL
  Filled 2024-07-08 (×2): qty 1

## 2024-07-08 MED ORDER — METOPROLOL SUCCINATE ER 25 MG TABLET,EXTENDED RELEASE 24 HR
25.0000 mg | ORAL_TABLET | Freq: Every day | ORAL | Status: DC
  Administered 2024-07-08 – 2024-07-09 (×2): 0 mg via ORAL
  Filled 2024-07-08: qty 1

## 2024-07-08 MED ORDER — ENOXAPARIN 40 MG/0.4 ML SUBCUTANEOUS SYRINGE
40.0000 mg | INJECTION | Freq: Every day | SUBCUTANEOUS | Status: DC
  Administered 2024-07-08: 0 mg via SUBCUTANEOUS
  Administered 2024-07-09: 40 mg via SUBCUTANEOUS
  Filled 2024-07-08: qty 0.4

## 2024-07-08 MED ORDER — METHYLPREDNISOLONE SOD SUCCINATE 40 MG/ML SOLUTION FOR INJ. WRAPPER
40.0000 mg | Freq: Three times a day (TID) | INTRAMUSCULAR | Status: DC
  Administered 2024-07-08 – 2024-07-09 (×5): 40 mg via INTRAVENOUS
  Filled 2024-07-08 (×5): qty 1

## 2024-07-08 MED ORDER — IPRATROPIUM 0.5 MG-ALBUTEROL 3 MG (2.5 MG BASE)/3 ML NEBULIZATION SOLN
3.0000 mL | INHALATION_SOLUTION | Freq: Four times a day (QID) | RESPIRATORY_TRACT | Status: DC
  Administered 2024-07-08 – 2024-07-09 (×7): 3 mL via RESPIRATORY_TRACT
  Filled 2024-07-08: qty 3

## 2024-07-08 MED ORDER — NICOTINE 21 MG/24 HR DAILY TRANSDERMAL PATCH
21.0000 mg | MEDICATED_PATCH | Freq: Every day | TRANSDERMAL | Status: DC
  Administered 2024-07-08 – 2024-07-09 (×2): 21 mg via TRANSDERMAL
  Filled 2024-07-08 (×2): qty 1

## 2024-07-08 MED ORDER — ESCITALOPRAM 20 MG TABLET
20.0000 mg | ORAL_TABLET | Freq: Every day | ORAL | Status: DC
  Administered 2024-07-08 – 2024-07-09 (×2): 20 mg via ORAL
  Filled 2024-07-08 (×2): qty 1

## 2024-07-08 MED ORDER — GUAIFENESIN 100 MG/5 ML ORAL LIQUID: 200.0000 mg | ORAL | Status: DC | PRN

## 2024-07-08 MED ORDER — HEPARIN (PORCINE) 5,000 UNIT/ML INJECTION SOLUTION
5000.0000 [IU] | Freq: Three times a day (TID) | INTRAMUSCULAR | Status: DC
  Administered 2024-07-08 (×2): 5000 [IU] via SUBCUTANEOUS
  Filled 2024-07-08 (×2): qty 1

## 2024-07-08 MED ORDER — POTASSIUM CHLORIDE ER 20 MEQ TABLET,EXTENDED RELEASE(PART/CRYST)
20.0000 meq | ORAL_TABLET | Freq: Every day | ORAL | Status: DC
  Administered 2024-07-08 – 2024-07-09 (×2): 20 meq via ORAL
  Filled 2024-07-08 (×2): qty 1

## 2024-07-08 MED ORDER — FUROSEMIDE 40 MG TABLET: 40.0000 mg | ORAL_TABLET | Freq: Every day | ORAL | Status: DC

## 2024-07-08 MED ORDER — GUAIFENESIN ER 600 MG TABLET, EXTENDED RELEASE 12 HR
600.0000 mg | EXTENDED_RELEASE_TABLET | Freq: Two times a day (BID) | ORAL | Status: DC
  Administered 2024-07-08 – 2024-07-09 (×3): 600 mg via ORAL
  Filled 2024-07-08 (×3): qty 1

## 2024-07-08 MED ORDER — IPRATROPIUM 0.5 MG-ALBUTEROL 3 MG (2.5 MG BASE)/3 ML NEBULIZATION SOLN: 3.0000 mL | INHALATION_SOLUTION | RESPIRATORY_TRACT | Status: DC | PRN

## 2024-07-08 NOTE — ED Nurses Note (Addendum)
 Pts brief changed at this time. [facility] here for transport at this time. VSS. IV patent and locked. Belongings placed in bag and given to EMS for transport. No s/s of acute distress noted at this time. DaughterMelissa informed of bed placement per request at this time and denies any questions.

## 2024-07-08 NOTE — ED Nurses Note (Signed)
 Report called to Twyla, RN @ [facility] 3E. All questions answered.

## 2024-07-08 NOTE — Care Plan (Signed)
 Goal Outcome Evaluation:     Anxieties, Fears or Concerns: none voiced (07/08/24 0500)  Individualized Care Needs: monitor vs and labs, manage n/v, assist with ADLs (07/08/24 0500)  Patient-Specific Goals (Include Timeframe): dc home when medically stable (07/08/24 0500)  Plan of Care Reviewed With: (not recorded)     Patient Progress: improving      Problem: Adult Inpatient Plan of Care  Goal: Absence of Hospital-Acquired Illness or Injury  Outcome: Ongoing (see interventions/notes)  Intervention: Prevent and Manage VTE (Venous Thromboembolism) Risk  Recent Flowsheet Documentation  Taken 07/08/2024 0500 by Twyla MATSU, RN  VTE Prevention/Management: ambulation promoted  Goal: Optimal Comfort and Wellbeing  Outcome: Ongoing (see interventions/notes)     Problem: Skin Injury Risk Increased  Goal: Skin Health and Integrity  Outcome: Ongoing (see interventions/notes)     Problem: COPD (Chronic Obstructive Pulmonary Disease) Exacerbation  Goal: Optimal Coping with Chronic Illness  Outcome: Ongoing (see interventions/notes)  Goal: Optimal Level of Functional Independence  Outcome: Ongoing (see interventions/notes)  Goal: Absence of Infection Signs and Symptoms  Outcome: Ongoing (see interventions/notes)  Goal: Improved Oral Intake  Outcome: Ongoing (see interventions/notes)  Goal: Effective Oxygenation and Ventilation  Outcome: Ongoing (see interventions/notes)     Problem: Nausea and Vomiting  Goal: Nausea and Vomiting Relief  Outcome: Ongoing (see interventions/notes)

## 2024-07-08 NOTE — Care Plan (Signed)
 Problem: Adult Inpatient Plan of Care  Goal: Absence of Hospital-Acquired Illness or Injury  Outcome: Ongoing (see interventions/notes)  Intervention: Prevent Skin Injury  Recent Flowsheet Documentation  Taken 07/08/2024 1800 by Cresenciano SQUIBB, RN  Body Position: supine, head elevated  Taken 07/08/2024 1600 by Cresenciano SQUIBB, RN  Body Position: supine, head elevated  Taken 07/08/2024 1400 by Cresenciano SQUIBB, RN  Body Position: supine, head elevated  Taken 07/08/2024 0800 by Cresenciano SQUIBB, RN  Body Position: supine, head elevated  Intervention: Prevent and Manage VTE (Venous Thromboembolism) Risk  Recent Flowsheet Documentation  Taken 07/08/2024 0800 by Cresenciano SQUIBB, RN  VTE Prevention/Management:   ambulation promoted   anticoagulant therapy maintained  Goal: Optimal Comfort and Wellbeing  Outcome: Ongoing (see interventions/notes)  Goal: Rounds/Family Conference  Outcome: Ongoing (see interventions/notes)     Problem: Skin Injury Risk Increased  Goal: Skin Health and Integrity  Outcome: Ongoing (see interventions/notes)     Problem: COPD (Chronic Obstructive Pulmonary Disease) Exacerbation  Goal: Optimal Coping with Chronic Illness  Outcome: Ongoing (see interventions/notes)  Goal: Optimal Level of Functional Independence  Outcome: Ongoing (see interventions/notes)  Goal: Absence of Infection Signs and Symptoms  Outcome: Ongoing (see interventions/notes)  Goal: Improved Oral Intake  Outcome: Ongoing (see interventions/notes)  Goal: Effective Oxygenation and Ventilation  Outcome: Ongoing (see interventions/notes)     Problem: Nausea and Vomiting  Goal: Nausea and Vomiting Relief  Outcome: Ongoing (see interventions/notes)

## 2024-07-08 NOTE — ED Notes (Signed)
 [facility] contacted at [phone] for transport to [facility] they advised that they would send a unit.

## 2024-07-08 NOTE — ED Nurses Note (Signed)
 Pt reports she is feeling better. Reports no vomiting or diarrhea. No s/s of acute distress noted at this time. Care ongoing.

## 2024-07-08 NOTE — ED Nurses Note (Signed)
 Pt resting with eyes closed w/ equal chest rise and fall w/ no s/s of acute distress noted at this time.

## 2024-07-08 NOTE — H&P (Signed)
 [facility]  History and Physical    Date of Service:  07/08/2024  Misty Johnson, Misty Johnson, 74 y.o. female  Encounter Start Date:  07/07/2024  Inpatient Admission Date: 07/08/2024  Date of Birth:  Nov 02, 1950  PCP: Rosina Bhat, APRN, CNP       Chief Complaint:  Increased SOB    HPI: Misty Johnson is a 74 y.o., White female who presents to [facility] from [facility] with complaints of increased shortness of breath.  Patient was seen and examined at bedside.  Patient states she has a recent admission for pneumonia and similar complaints.  Patient states she was discharged home and did not feel any better.  Patient states she started having diarrhea as well yesterday.  Patient does have history of Chronic Obstructive Pulmonary Disease, congestive heart failure, nicotine  dependence, CAD, depression, and uses O2 chronically at home 3 L via NC.  ABGs in the ED include:  PH 7.47, pCO2 42, PO2 80, bicarbonate 29.7, and base excess 6.2.  Patient had a CTA chest that was negative for acute pulmonary embolism, and showed moderate emphysema and cardiomegaly.  Chest x-ray in the ED showed improved aeration of the left lower lung zone in no acute findings.  Patient denies any other complaints at this time.  Patient will be admitted to hospitalist services for further evaluation.    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     Enlarged heart     LVH (left ventricular hypertrophy)     Major depression              Medications Prior to Admission       Prescriptions    amoxicillin -pot clavulanate (AUGMENTIN ) 875-125 mg Oral Tablet    Take 1 Tablet by mouth Twice daily for 3 days    atorvastatin  (LIPITOR) 40  mg Oral Tablet    Take 1 Tablet (40 mg total) by mouth Every evening    budesonide  (PULMICORT  RESPULES) 0.5 mg/2 mL Inhalation nebulizer suspension    Take 4 mL (1 mg total) by nebulization Twice daily for 14 days Indications: worsening of debilitating chronic lung disease called COPD, J44.1    doxycycline  hyclate 100 mg Oral Tablet    Take 1 Tablet (100 mg total) by mouth Twice daily for 5 days    ergocalciferol, vitamin D2, (DRISDOL) 1,250 mcg (50,000 unit) Oral Capsule    Take 1 Capsule (50,000 Units total) by mouth Every 7 days Saturday    escitalopram  oxalate (LEXAPRO ) 20 mg Oral Tablet    Take 1 Tablet (20 mg total) by mouth Daily    fluticasone -umeclidin-vilanter (TRELEGY ELLIPTA) 100-62.5-25 mcg Inhalation Disk with Device    Take 1 Inhalation by inhalation Daily    furosemide  (LASIX ) 40 mg Oral Tablet    Take 1 Tablet (40 mg total) by mouth Daily for 90 days    guaiFENesin  (MUCINEX ) 600 mg Oral Tablet Extended Release 12hr    Take 1 Tablet (600 mg total) by mouth Twice daily for 14 days    ipratropium-albuterol  0.5 mg-3 mg(2.5 mg base)/3 mL Solution for  Nebulization    Take 3 mL by nebulization Four times a day Indications: bronchi muscle spasm resulting from COPD, J44.1    Levocetirizine (XYZAL) 5 mg Oral Tablet    Take 1 Tablet (5 mg total) by mouth Every evening    metoprolol  succinate (TOPROL -XL) 25 mg Oral Tablet Sustained Release 24 hr    Take 1 Tablet (25 mg total) by mouth Daily    OXYGEN -AIR DELIVERY SYSTEMS DEACT    3L NC at home    pantoprazole  (PROTONIX ) 40 mg Oral Tablet, Delayed Release (E.C.)    Take 1 Tablet (40 mg total) by mouth Once a day for 90 days    potassium chloride  (K-DUR) 20 mEq Oral Tab Sust.Rel. Particle/Crystal    Take 1 Tablet (20 mEq total) by mouth Daily    predniSONE  (DELTASONE ) 10 mg Oral Tablet    Take 2 Tablets (20 mg total) by mouth Daily for 3 days, THEN 1 Tablet (10 mg total) Daily for 2 days, THEN 0.5 Tablets (5 mg total) Daily for 2 days.    revefenacin  (YUPELRI )  175 mcg/3 mL Inhalation nebulizer solution    Take 3 mL (175 mcg total) by nebulization Daily          Allergies[1]    Past Surgical History:  Past Surgical History:   Procedure Laterality Date    NO PAST SURGERIES             Family History:  Family Medical History:       Problem Relation (Age of Onset)    No Known Problems Mother, Father               Social History:  Social History[2]     Review of Systems:  All systems are reviewed and are negative except those mentioned in the HPI portion    Examination:  BP 100/73   Pulse 65   Temp 36.6 C (97.8 F)   Resp 17   Ht 1.626 m (5' 4.02)   Wt 70.2 kg (154 lb 12.2 oz)   SpO2 97%   BMI 26.55 kg/m         General: Patient is alert and oriented to person, place and time.    HEENT: Pupils are of round shape, equal in size, and reactive to light bilaterally. Oral mucous membranes are moist.    Heart: S1 and S2 are present. No appreciable murmur.    Lungs: Breath sounds are appreciated at all posterior lung fields, faint wheezing noted.    Gastrointestinal: Bowel sounds are appreciated at all 4 quadrants. Abdomen is soft, not appreciably distended, non-tender to palpation at all quadrants.    Extremities: Radial pulses are 3/4 bilaterally, dorsalis pedis pulses are 3/4 bilaterally. Capillary refill is less than 3 seconds at distal digits bilaterally. No appreciable edema of the lower extremities.    Genitourinary: No appreciable suprapubic tenderness.    Neurologic: Follows commands appropriately. No appreciable facial droop. No appreciable focal weakness of the bilateral upper or lower extremities.    Skin: Grossly intact at observable areas.    Labs:    Lab Results Today:    Results for orders placed or performed during the hospital encounter of 07/07/24 (from the past 24 hours)   ECG 12 LEAD   Result Value Ref Range    Ventricular rate 65 BPM    Atrial Rate 65 BPM    PR Interval 110 ms    QRS Duration 92 ms    QT Interval 426  ms    QTC Calculation 443 ms     Calculated P Axis 34 degrees    Calculated R Axis 32 degrees    Calculated T Axis 53 degrees   COMPREHENSIVE METABOLIC PANEL, NON-FASTING   Result Value Ref Range    SODIUM 146 (H) 136 - 145 mmol/L    POTASSIUM 3.9 3.5 - 5.1 mmol/L    CHLORIDE 103 98 - 107 mmol/L    CO2 TOTAL 31 21 - 32 mmol/L    ANION GAP 12 4 - 13 mmol/L    BUN 25 (H) 7 - 18 mg/dL    CREATININE 8.83 (H) 0.55 - 1.02 mg/dL    BUN/CREA RATIO 22     ESTIMATED GFR 49 (L) >59 mL/min/1.41m2    ALBUMIN 3.8 3.4 - 5.0 g/dL    CALCIUM  9.7 8.5 - 10.1 mg/dL    GLUCOSE 867 (H) 74 - 106 mg/dL    ALKALINE PHOSPHATASE 84 46 - 116 U/L    ALT (SGPT) 18 <=78 U/L    AST (SGOT) 28 15 - 37 U/L    BILIRUBIN TOTAL 0.9 0.2 - 1.0 mg/dL    PROTEIN TOTAL 7.2 6.4 - 8.2 g/dL    ALBUMIN/GLOBULIN RATIO 1.1 0.8 - 1.4    OSMOLALITY, CALCULATED 297 (H) 270 - 290 mOsm/kg    CALCIUM , CORRECTED 9.9 mg/dL    GLOBULIN 3.4    PT/INR   Result Value Ref Range    PROTHROMBIN TIME 11.8 9.8 - 12.7 seconds    INR 1.04 0.84 - 1.10   PTT (PARTIAL THROMBOPLASTIN TIME)   Result Value Ref Range    APTT 20.8 (L) 25.0 - 38.0 seconds   TROPONIN-I NOW   Result Value Ref Range    TROPONIN I 15 (H) <15 ng/L   D-DIMER   Result Value Ref Range    D-DIMER 1,992 (HH) 215 - 500 ng/mL FEU   LIPASE   Result Value Ref Range    LIPASE 73 15 - 77 U/L   MAGNESIUM    Result Value Ref Range    MAGNESIUM  2.3 1.8 - 2.4 mg/dL   CBC WITH DIFF   Result Value Ref Range    WBC 10.0 3.8 - 11.8 x103/uL    RBC 4.78 3.63 - 4.92 x106/uL    HGB 12.5 10.9 - 14.3 g/dL    HCT 57.6 (H) 68.7 - 41.9 %    MCV 88.4 75.5 - 95.3 fL    MCH 26.2 24.7 - 32.8 pg    MCHC 29.6 (L) 32.3 - 35.6 g/dL    RDW 76.8 (H) 87.6 - 17.7 %    PLATELETS 163 140 - 440 x103/uL    MPV 10.8 7.9 - 10.8 fL    NEUTROPHIL % 84 (H) 43 - 77 %    LYMPHOCYTE % 8 (L) 16 - 46 %    MONOCYTE % 7 4 - 11 %    EOSINOPHIL % 1 1 - 7 %    BASOPHIL % 0 0 - 1 %    NEUTROPHIL # 8.41 (H) 1.90 - 8.20 x103/uL    LYMPHOCYTE # 0.81 (L) 1.10 - 3.10 x103/uL    MONOCYTE # 0.67 0.20 -  0.90 x103/uL    EOSINOPHIL # 0.06 0.00 - 0.50 x103/uL    BASOPHIL # 0.04 0.00 - 0.10 x103/uL   BLOOD GAS Arterial   Result Value Ref Range    %FIO2 (ARTERIAL) 28 %    PH (ARTERIAL) 7.47 (H) 7.35 - 7.45  PCO2 (ARTERIAL) 42 35 - 45 mm/Hg    PO2 (ARTERIAL) 80 (L) 83 - 108 mm/Hg    BICARBONATE (ARTERIAL) 29.7 (H) 21.0 - 28.0 mmol/L    BASE EXCESS (ARTERIAL) 6.2 (H) 0.0 - 3.0 mmol/L    PAO2/FIO2 RATIO 286     O2 SATURATION (ARTERIAL) 96.5 94.0 - 98.0 %   TROPONIN-I IN ONE HOUR   Result Value Ref Range    TROPONIN I 15 (H) <15 ng/L   URINALYSIS, MACRO/MICRO   Result Value Ref Range    COLOR Yellow Light Yellow, Yellow    APPEARANCE Clear Clear    SPECIFIC GRAVITY <=1.005 1.003 - 1.035    PH 6.0 4.6 - 8.0    LEUKOCYTES Negative Negative WBCs/uL    NITRITE Negative Negative    PROTEIN Negative Negative mg/dL    GLUCOSE Negative Negative mg/dL    KETONES Negative Negative mg/dL    BILIRUBIN Negative Negative mg/dL    BLOOD Trace (A) Negative mg/dL    UROBILINOGEN 0.2 0.2 - 1.0 mg/dL   URINALYSIS, MICROSCOPIC   Result Value Ref Range    RBCS 0-3 0-3, Not Present /hpf    BACTERIA Rare (A) Negative /hpf    WBCS 0-5 Not Present, Occasional, 0-5 /hpf    SQUAMOUS EPITHELIAL Few Not Present, Few /hpf   TROPONIN-I IN THREE HOURS   Result Value Ref Range    TROPONIN I 16 (H) <15 ng/L   NT-PROBNP   Result Value Ref Range    NT-PROBNP 3,153 (H) <=450 pg/mL   COVID-19, FLU A/B, RSV RAPID BY PCR   Result Value Ref Range    SARS-CoV-2 Not Detected Not Detected    INFLUENZA VIRUS TYPE A Not Detected Not Detected    INFLUENZA VIRUS TYPE B Not Detected Not Detected    RESPIRATORY SYNCYTIAL VIRUS (RSV) Not Detected Not Detected       Imaging Studies:  Results for orders placed or performed during the hospital encounter of 07/07/24 (from the past 24 hours)   XR AP MOBILE CHEST     Status: None    Narrative    Misty Johnson    RADIOLOGIST: Rankin DELENA Louder, MD    XR AP MOBILE CHEST performed on 07/07/2024 6:24 PM    CLINICAL HISTORY: Chest  Pain    TECHNIQUE: Frontal view of the chest.    COMPARISON:  06/28/2024    FINDINGS:  Overlying EKG leads are in place. The cardiac silhouette is moderately enlarged. There is improved aeration of the left lower lung zone. Some blunting of the right costophrenic sulcus is unchanged. No new airspace disease is visualized. The pulmonary vascularity is normal. No evidence of pleural effusion or pneumothorax. No acute bony abnormality is visualized.      Impression    Improved aeration of the left lower lung zone. No new acute findings.            Radiologist location ID: [id number]     CT ABDOMEN PELVIS WO IV CONTRAST     Status: None    Narrative    Misty Johnson    RADIOLOGIST: Rankin DELENA Louder, MD    CT ABDOMEN PELVIS WO IV CONTRAST performed on 07/07/2024 6:26 PM    CLINICAL HISTORY: nv mid abd pain.    COMPARISON:    06/21/2024    TECHNIQUE:   CT of the abdomen and pelvis without contrast.    FINDINGS:    There is no basilar airspace disease. Minor atelectasis is  present in the right lung base.    Noncontrast images of the liver, gallbladder, pancreas, spleen, kidneys, and adrenal glands reveal no acute abnormalities. Small shallow diverticula are noted within the anterior lateral aspects of the urinary bladder. Uterus and the adnexa are not enlarged.    The stomach, duodenum, and more distal small bowel are normal in caliber and wall thickness. The appendix is not visualized but there are no pericecal inflammatory changes. Colon is normal in caliber and wall thickness.    There is no abdominal or pelvic free fluid or free intraperitoneal air. The abdominal aorta is severely atheromatous but remains normal in caliber. No enlarged abdominal or pelvic lymph nodes are visualized.    Bone windows reveal no destructive bone lesions or evidence of acute bony abnormality.      Impression    No acute findings in the abdomen or pelvis by noncontrast CT.              Radiologist location ID: [id number]     CT ANGIO CHEST  FOR PULMONARY EMBOLUS W IV CONTRAST     Status: None    Narrative    Misty Johnson    RADIOLOGIST: Rankin DELENA Louder, MD    CT ANGIO CHEST FOR PULMONARY EMBOLUS W IV CONTRAST performed on 07/07/2024 7:09 PM    CLINICAL HISTORY: sob elevated d-dimer.    COMPARISON:    06/25/2024    TECHNIQUE:   CTA of the chest with IV contrast timed for maximal opacification of the pulmonary arteries. 3-D postprocessing was performed including coronal MIP reconstructions.  CONTRAST:  100 cc of Omnipaque  350    FINDINGS:    There is excellent opacification of the pulmonary arterial vasculature. There are no intraluminal filling defects within the pulmonary arteries to indicate acute pulmonary embolism. The main pulmonary arteries are mildly enlarged which may indicate pulmonary arterial hypertension. The thoracic aorta is normal in caliber. The heart is moderately enlarged without pericardial effusion. There are no enlarged thoracic lymph nodes or other evidence of mediastinal mass. The thoracic esophagus is grossly unremarkable.    Moderate emphysematous changes are present with an upper lobe predominance. There is no confluent airspace disease to indicate pneumonia. There is minor dependent atelectasis in the right lower lobe. A 5 x 3 mm nodule abutting the major fissure in the posterior segment of the left upper lobe is unchanged from 2022 consistent with benign etiology. The central airways are patent. There is no pleural effusion or pneumothorax.    No acute findings are visualized within the imaged portions of the upper abdomen. No acute bony abnormality of the thorax is visualized. Moderate compression deformity of T8 is chronic and unchanged from 2022.      Impression    No evidence of acute pulmonary embolism or other acute findings in the chest.    Moderate emphysema.    Moderate cardiomegaly.              Radiologist location ID: [id number]          Assessment/Plan:   Active Hospital Problems    Diagnosis    Primary  Problem: Acute exacerbation of chronic obstructive pulmonary disease (COPD) (CMS HCC)    Hyperlipidemia    Nicotine  dependence     Acute exacerbation of Chronic Obstructive Pulmonary Disease   - DuoNebs 4 times daily x8 doses; then q.4h PRN   - IV Solu-Medrol  40 mg q.8h   - Sputum culture   - P.o. Robitussin PRN for  increased cough   - Incentive spirometry PRN     Diarrhea   - Stool culture   - GI BioFire   - C difficile PCR    Nicotine  dependence  - Smoking cessation education   - Patient states she does not want a nicotine  patch    Hyperlipidemia   - Resume home medications    Plan to admit patient MIP.  Telemetry, continuous pulse ox, and supplemental oxygen  titration prn will be ordered.  Will verify patient's home medications.  Cardiac diet.  We will increase p.o. Lasix  from 40 mg daily to b.i.d. considering increase in BNP.  Plan also get a echocardiogram this a.m.SABRA  Plan repeat labs tomorrow.  See attending addendum and orders for further information.    DVT/PE Prophylaxis: SCDs/ Venodynes/Impulse boots and Heparin     Marney Sharps, APRN, CNP    Contents of the document, in whole or in part, are completed utilizing M*Modal dictation technology, please forgive any typographical errors that may exist.         [1]   Allergies  Allergen Reactions    Hydrocodone  Nausea/ Vomiting   [2]   Social History  Tobacco Use    Smoking status: Every Day     Current packs/day: 1.00     Types: Cigarettes    Smokeless tobacco: Never   Vaping Use    Vaping status: Never Used   Substance Use Topics    Alcohol  use: Not Currently    Drug use: Never

## 2024-07-09 ENCOUNTER — Inpatient Hospital Stay (HOSPITAL_COMMUNITY): Payer: Medicare (Managed Care)

## 2024-07-09 LAB — CBC
HCT: 29.7 % — ABNORMAL LOW (ref 31.2–41.9)
HGB: 9.6 g/dL — ABNORMAL LOW (ref 10.9–14.3)
MCH: 27.3 pg (ref 24.7–32.8)
MCHC: 32.5 g/dL (ref 32.3–35.6)
MPV: 9.8 fL (ref 7.9–10.8)
PLATELETS: 112 x10ˆ3/uL — ABNORMAL LOW (ref 140–440)
RBC: 3.53 x10ˆ6/uL — ABNORMAL LOW (ref 3.63–4.92)
RDW: 25.4 % — ABNORMAL HIGH (ref 12.3–17.7)
WBC: 8 x10ˆ3/uL (ref 3.8–11.8)

## 2024-07-09 LAB — BASIC METABOLIC PANEL
ANION GAP: 5 mmol/L (ref 4–13)
BUN/CREA RATIO: 20 (ref 6–22)
BUN: 18 mg/dL (ref 7–25)
CALCIUM: 8.4 mg/dL — ABNORMAL LOW (ref 8.6–10.3)
CHLORIDE: 102 mmol/L (ref 98–107)
CO2 TOTAL: 31 mmol/L (ref 21–31)
CREATININE: 0.88 mg/dL (ref 0.60–1.30)
ESTIMATED GFR: 68 mL/min/1.73mˆ2 (ref >59)
GLUCOSE: 155 mg/dL — ABNORMAL HIGH (ref 74–109)
OSMOLALITY, CALCULATED: 281 mosm/kg (ref 270–290)
POTASSIUM: 4 mmol/L (ref 3.5–5.1)
SODIUM: 138 mmol/L (ref 136–145)

## 2024-07-09 LAB — SCAN DIFFERENTIAL
PLATELET MORPHOLOGY COMMENT: ADEQUATE
SCHISTOCYTES: ABSENT

## 2024-07-09 LAB — C-REACTIVE PROTEIN (CRP): C-REACTIVE PROTEIN (CRP): <0.5 mg/dL (ref 0.1–0.5)

## 2024-07-09 LAB — MAGNESIUM: MAGNESIUM: 2.1 mg/dL (ref 1.9–2.7)

## 2024-07-09 MED ORDER — PREDNISONE 20 MG TABLET: 40.0000 mg | ORAL_TABLET | Freq: Every day | ORAL | 0 refills | Status: AC

## 2024-07-09 NOTE — Care Management Notes (Signed)
 Referral Information  ++++++ Placed Provider #1 ++++++  Case Manager: Julienne Kass  Provider Type: Home Health  Provider Name: [facility]  Address:  [address]  [place], [place] [address]  Contact:  Admissions Administrator  Phone: [phone] x  Fax:   Fax: [phone]

## 2024-07-09 NOTE — Discharge Instructions (Addendum)
 After PT eval completed and speech eval completed   Take lasix  40 mg daily and take lasix  40 mg as needed extra dose daily for increasing shortness of breath or leg swelling  F/u with cardiology outpatient  F/u with GI outpatient for colonoscopy  F/u with pulmonology outpatient  Return to ER if symptoms worsen

## 2024-07-09 NOTE — Care Management Notes (Signed)
 [facility]  Care Management Initial Evaluation    Patient Name: Misty Johnson  Date of Birth: 1951/05/02  Sex: female  Date/Time of Admission: 07/07/2024  5:21 PM  Room/Bed: 330/B  Payor: HUMANA MEDICARE / Plan: HUMANA MEDICARE / Product Type: MEDICARE [facility] /   Primary Care Providers:  Hamp Knee, APRN, CNP, APRN, C* (General)    Pharmacy Info:   Preferred Pharmacy       [facility] [address] - [place], [place] - [address]    [address] [place] [address]    Phone: [phone] Fax: [phone]    Hours: Not open 24 hours    [facility] [address], [place] - [address]    [address] [place] [address]    Phone: [phone] Fax: [phone]    Hours: Not open 24 hours          Emergency Contact Info:   Extended Emergency Contact Information  Primary Emergency Contact: Misty Johnson  Mobile Phone: [phone]  Relation: Daughter  Interpreter needed? No    History:   Misty Johnson is a 74 y.o., female, admitted     Height/Weight: 162.6 cm (5' 4.02) / 70.2 kg (154 lb 12.2 oz)     LOS: 1 day   Admitting Diagnosis: Acute exacerbation of chronic obstructive pulmonary disease (COPD) (CMS HCC) [J44.1]    Assessment:      07/09/24 1352   Assessment Details   Assessment Type Admission   Date of Care Management Update 07/09/24   Readmission   Is this a readmission? Yes   Is this a scheduled readmission? No   Employment/Financial   Patient has Prescription Coverage?  Yes        Name of Insurance Coverage for Medications HUMANA MEDICARE   Financial/Environmental Concerns none   Living Environment   Select an age group to open lives with row.  Adult   Lives With child(ren), adult   Living Arrangements house   Able to Return to Prior Arrangements yes   Living Arrangement Comments PATIENT RESIDES IN A 1-STORY HOME WITH HER SON   Home Safety   Home Assessment: No Problems Identified   Home Accessibility stairs to enter home;no concerns   Custody and Legal  Status   Do you have a court appointed guardian/conservator? No   Are you an emancipated minor? No   Custody Issues? No   Paternity Affidavit Requested? No   Care Management Plan   Discharge Planning Status initial meeting   Discharge plan discussed with: Patient   CM will evaluate for rehabilitation potential yes   Patient choice offered to patient/family Yes   Form for patient choice reviewed/signed and on chart yes   Facility or Agency Preferences [facility] FOR PT/OT/NURSING   Discharge Needs Assessment   Equipment Currently Used at Home walker, front wheeled;wheelchair;commode;shower chair   Equipment Needed After Discharge none   Discharge Facility/Level of Care Needs Home (Patient/Family Member/other)(code 1)   Transportation Available family or friend will provide   Discharge Information   Discharge Disposition home health   Referral Information   Admission Type inpatient   Address Verified verified-no changes   Arrived From home or self-care         Discharge Plan:  Home (Patient/Family Member/other) (code 1)  INITIAL ASSESSMENT: CM SPOKE WITH THE PATIENT REGARDING D/C GOALS. THE PATIENT RESIDES WITH HER ADULT SON AND STATES THAT HER DAUGHTER LIVES CLOSE AND TRANSPORTS HERE. THE PATIENT  REPORTS THAT She HAS THE DME THAT She NEEDS INCLUDING 3 LITERS 02 AND REQUESTED THAT HER [facility] CONTINUE.     CM PENDED [facility] ORDER FOR [facility] FOR NURSING/PT/OT AND REQUESTED SIGNATURE. REFERRAL WAS MADE IN CAREPORT WITH ORDER TO FOLLOW.     The patient will continue to be evaluated for developing discharge needs.     Case Manager: Orie Louder, SOCIAL WORKER  Phone: [phone]

## 2024-07-09 NOTE — Discharge Summary (Signed)
 [facility]  DISCHARGE SUMMARY    PATIENT NAME:  Aireona, Torelli  MRN:  [id number]  DOB:  [birth year]    ENCOUNTER DATE:  07/07/2024  INPATIENT ADMISSION DATE: 07/08/2024  DISCHARGE DATE:  07/09/2024    ATTENDING PHYSICIAN: Beren Yniguez, MD  SERVICE: PRN HOSPITALIST 2  PRIMARY CARE PHYSICIAN: Rosina Bhat, APRN, CNP       No lay caregiver identified.    PRIMARY DISCHARGE DIAGNOSIS: Acute exacerbation of chronic obstructive pulmonary disease (COPD) (CMS [facility])  Active Hospital Problems    Diagnosis Date Noted    Principal Problem: Acute exacerbation of chronic obstructive pulmonary disease (COPD) (CMS HCC) [J44.1] 07/08/2024    Hyperlipidemia [E78.5] 07/08/2024    Nicotine  dependence [F17.200] 10/26/2023      Resolved Hospital Problems   No resolved problems to display.     Active Non-Hospital Problems    Diagnosis Date Noted    History of chronic obstructive pulmonary disease 06/25/2024    Pneumonia 06/21/2024    Anemia, iron  deficiency 06/21/2024    Anemia 06/21/2024    COPD (chronic obstructive pulmonary disease) 02/15/2024    Hypertrophic cardiomyopathy (CMS HCC) 10/28/2023    Acute exacerbation of CHF (congestive heart failure) 10/26/2023    COPD with acute exacerbation (CMS HCC) 07/01/2022    Congestive heart failure, unspecified HF chronicity, unspecified heart failure type (CMS HCC) 07/01/2022    Dyspnea, unspecified type 07/01/2022    Acute on chronic hypoxic respiratory failure (CMS HCC) 07/01/2022    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        Details   atorvastatin  40 mg Tablet  Commonly known as: LIPITOR   40 mg, EVERY EVENING  Refills: 0     budesonide  0.5 mg/2 mL nebulizer suspension  Commonly known as: PULMICORT  RESPULES   1 mg, Nebulization, 2 TIMES DAILY  Qty: 112 mL  Refills: 0     ergocalciferol (vitamin D2) 1,250 mcg (50,000 unit) Capsule  Commonly known as: DRISDOL   50,000  Units, EVERY 7 DAYS  Refills: 0     escitalopram  oxalate 20 mg Tablet  Commonly known as: LEXAPRO    20 mg, Daily  Refills: 0     furosemide  40 mg Tablet  Commonly known as: LASIX    40 mg, Oral, Daily  Qty: 90 Tablet  Refills: 0     guaiFENesin  600 mg Tablet Extended Release 12hr  Commonly known as: MUCINEX    600 mg, Oral, 2 TIMES DAILY  Qty: 28 Tablet  Refills: 0     ipratropium-albuteroL  0.5 mg-3 mg(2.5 mg base)/3 mL nebulizer solution  Commonly known as: DUONEB   3 mL, Nebulization, 4 TIMES DAILY  Qty: 120 Each  Refills: 0     Levocetirizine 5 mg Tablet  Commonly known as: XYZAL   5 mg, EVERY EVENING  Refills: 0     metoprolol  succinate 25 mg Tablet Sustained Release 24 hr  Commonly known as: TOPROL -XL   25 mg, Daily  Refills: 0     OXYGEN -AIR DELIVERY SYSTEMS DEACT   3L NC at home  Refills: 0     pantoprazole  40 mg Tablet, Delayed Release (E.C.)  Commonly known as: PROTONIX    40 mg, Oral, DAILY  Qty: 90 Tablet  Refills: 0     potassium chloride  20 mEq Tab Sust.Rel. Particle/Crystal  Commonly  known as: K-DUR   20 mEq, Daily  Refills: 0     Trelegy Ellipta 100-62.5-25 mcg Disk with Device  Generic drug: fluticasone -umeclidin-vilanter   1 Inhalation, Daily  Refills: 0     Yupelri  175 mcg/3 mL nebulizer solution  Generic drug: revefenacin    175 mcg, DAILY  Refills: 0            STOP taking these medications.      amoxicillin -pot clavulanate 875-125 mg Tablet  Commonly known as: AUGMENTIN      doxycycline  hyclate 100 mg Tablet     predniSONE  10 mg Tablet  Commonly known as: DELTASONE             Discharge med list refreshed?  YES     Allergies[1]  HOSPITAL PROCEDURE(S):   No orders of the defined types were placed in this encounter.      REASON FOR HOSPITALIZATION AND HOSPITAL COURSE   BRIEF HPI:  This is a 74 y.o., female admitted for due to increased shortness of breath. Patient does have history of Chronic Obstructive Pulmonary Disease, congestive heart failure, nicotine  dependence, CAD, depression, and uses O2  chronically at home 3 L via NC. ABGs in the ED include: PH 7.47, pCO2 42, PO2 80, bicarbonate 29.7, and base excess 6.2. Patient had a CTA chest that was negative for acute pulmonary embolism, and showed moderate emphysema and cardiomegaly. Chest x-ray in the ED showed improved aeration of the left lower lung zone in no acute findings. Patient denies any other complaints at this time. Patient will be admitted to hospitalist services for further evaluation.     BRIEF HOSPITAL NARRATIVE:       Patient was managed for the following -     Acute exacerbation of Chronic Obstructive Pulmonary Disease   Chronic hypoxic respiratory failure   - on baseline oxygen  of 3 L currently  - patient wanting to be discharged home  - discharge on prednisone  40 mg  for 5 days  - f/u with PCP outpatient     HCM  - f/u outpatient with cardiology  - continue home dose of lasix  40 mg po daily and can take an extra dose as needed  - last echo showed EF of 70%; apical hypertrophic cardiomyopathy; mild pulmonary hypertension      Diarrhea   - resolved  - only had episode at home  - stated did not have any diarrhea at hospital     Nicotine  dependence  - Smoking cessation education   - Patient states she does not want a nicotine  patch     Hyperlipidemia   - Resume home medications    01/06  Patient seen and examined bedside.  Patient is on 3 L nasal cannula which is baseline for the patient.  Patient denied any complaints.  Patient was working with PT.  Official PT note is pending.  Patient stated she feels well and wants to be discharged home.    Physical Exam  Cardiovascular:      Rate and Rhythm: Normal rate and regular rhythm.      Pulses: Normal pulses.   Pulmonary:      Effort: Pulmonary effort is normal. No respiratory distress.      Breath sounds: No wheezing.   Abdominal:      General: There is no distension.      Tenderness: There is no abdominal tenderness.   Musculoskeletal:      Right lower leg: No edema.      Left  lower leg: No  edema.   Neurological:      Mental Status: She is alert.   Psychiatric:         Mood and Affect: Mood normal.           TRANSITION/POST DISCHARGE CARE/PENDING TESTS/REFERRALS:   After PT eval completed and speech eval completed   Take lasix  40 mg daily and take lasix  40 mg as needed extra dose daily for increasing shortness of breath or leg swelling  F/u with cardiology outpatient  F/u with GI outpatient for colonoscopy  F/u with pulmonology outpatient  Return to ER if symptoms worsen    CONDITION ON DISCHARGE:  A. Ambulation: Ambulation with assistive device  B. Self-care Ability: Complete  C. Cognitive Status Alert and Oriented x 3  D. Code status at discharge:       LINES/DRAINS/WOUNDS AT DISCHARGE:   Patient Lines/Drains/Airways Status       Active Line / Dialysis Catheter / Dialysis Graft / Drain / Airway / Wound       Name Placement date Placement time Site Days    Peripheral IV Ultrasound guided Anterior;Right;Upper Arm 07/07/24  1903  -- 1                    DISCHARGE DISPOSITION:  Home discharge  DISCHARGE INSTRUCTIONS:  Post-Discharge Follow Up Appointments       Monday Aug 12, 2024    New Patient Visit with Ezzard Shuck, APRN, CNP at  8:30 AM      Pulmonary and Sleep Disorders, [facility], [place]   [address]  [place] [address]  [phone]             Refer to Home Health - EXTERNAL          Bedford Aurora, MD    Copies sent to Care Team         Relationship Specialty Notifications Start End    Hamp Knee, APRN, CNP PCP - General FAMILY NURSE PRACTITIONER Admissions 06/24/24     Phone: [phone] Fax: [phone]         [address]  [place] [address]            Referring providers can utilize [email or web address] to access their referred [facility] patient's information.                               [1]   Allergies  Allergen Reactions    Hydrocodone  Nausea/ Vomiting

## 2024-07-09 NOTE — Nurses Notes (Signed)
 Patient discharged home with family.  AVS reviewed with patient/care giver.  A written copy of the AVS and discharge instructions was given to the patient/care giver.  Questions sufficiently answered as needed.  Patient/care giver encouraged to follow up with PCP as indicated.  In the event of an emergency, patient/care giver instructed to call 911 or go to the nearest emergency room. IV removed with catheter intact. Tele removed and returned to nurses station.

## 2024-07-09 NOTE — PT Evaluation (Signed)
 [facility]  [address]  [place], [address]  [phone]  (Fax) [phone]  Rehabilitation Services  Physical Therapy Inpatient Initial Evaluation    Patient Name: Misty Johnson  Date of Birth: 1950-09-30  Height: Height: 162.6 cm (5' 4.02)  Weight: Weight: 70.2 kg (154 lb 12.2 oz)  Room/Bed: 330/B  Payor: [employer] MEDICARE / Plan: [employer] MEDICARE / Product Type: MEDICARE [facility] /       PMH:  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 PMH: COPD, depression CHF.  PT eval completed with patient able to cover 25ft with FWW and portable 02  before fatigue.  she requires CGA for safety and would benefit from homehealth PT when d/c home with family assist.   Therapy will follow patient while in facility to advance her functional strength for transfers/gait    Total Distance Ambulated: (P) 85  Independence: (P) contact guard assist  Assistive Device: (P) walker, front wheeled      Discharge Needs:    Equipment Recommendation: (P) none anticipated      The patient presents with mobility limitations due to impaired balance, impaired strength, and impaired functional activity tolerance that significantly impair/prevent patients ability to participate in mobility-related activities of daily living (MRADLs) including  ambulation and transfers in order to safely complete, safely entering/exiting the home. This functional mobility deficit can be sufficiently resolved with the use of a (P) none anticipated  in  order to decrease the risk of falls, morbidity, and mortality in performance of these MRADLs.  Patient is able to safely use this assistive device.    Discharge Disposition: (P) home with home health    JUSTIFICATION OF DISCHARGE RECOMMENDATION   Based on current diagnosis, functional performance prior to admission, and current functional performance, this patient requires continued PT services in (P) home with home health in order to achieve significant functional improvements in these deficit areas: (P) muscle performance, gait, locomotion, and balance.        Plan:   Current Intervention:    To provide physical therapy services (P)  (1-2xday/ at least one day weekly)  for duration of (P) until discharge.    The risks/benefits of therapy have been discussed with the patient/caregiver and he/she is in agreement with the established plan of care.       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  PMH: COPD, depression CHF   Medical Lines Telemetry   Respiratory Status nasal cannula   Existing Precautions/Restrictions fall precautions   Mutuality/Individual Preferences   Anxieties, Fears or Concerns none stated   Individualized Care Needs monitor vital signs with  mobility testing   Patient-Specific Goals (Include Timeframe) d/c home when medically ready   Plan of Care Reviewed With patient   Living Environment   Lives With child(ren), adult   Living Arrangements house   Home Assessment: No Problems Identified   Home Accessibility no concerns   Home Main Entrance   Number of Stairs, Main Entrance five   Functional Level Prior   Ambulation 0 - independent   Transferring 0 - independent   Toileting 0 - independent   Bathing 3 - assistive equipment and person   Dressing 0 - independent   Eating 0 - independent   Communication 0 - understands/communicates without difficulty   Pre Treatment Status   Pre Treatment Patient Status Patient supine in bed;Call light within reach;Telephone within reach;Patient safety alarm activated;Nurse approved session   Cognitive Assessment/Interventions   Behavior/Mood Observations behavior appropriate to situation, WNL/WFL   Orientation Status oriented x 3   Attention WNL/WFL   Follows Commands WNL   Pre- Treatment Vital Signs   Pre-Treatment Heart Rate (beats/min) 64   Pre SpO2 (%) 95   O2 Delivery Pre Treatment supplemental O2   Pre-Treatment Pain   Pretreatment Pain Rating 0/10 - no pain   RLE Assessment   RLE Assessment X-Exceptions   RLE Strength 3+/5   LLE Assessment   LLE Assessment X-Exceptions  (WFL)   LLE Strength 3+/5   Trunk Assessment   Trunk Assessment X-Exceptions   Trunk Strength 3/5   Mobility Assessment/Training   Additional Documentation Bed Mobility Assessment/Treatment (Group);Transfer Assessment/Treatment (Group);Gait Assessment/Treatment (Group)   Bed Mobility   Roll Left Independence contact guard assist   Roll Right Independence contact guard assist   Transfer Assessment/Treatment   Sit-Stand Independence minimum assist (75% patient effort)   Stand-Sit Independence minimum assist (75% patient effort)   Sit-Stand-Sit, Assist Device walker, front wheeled   Gait Assessment/Treatment   Total Distance Ambulated 85   Independence   contact guard assist   Assistive Device  walker, front wheeled   Distance in Feet 85   Post Treatment Status   Post Treatment Patient Status Patient supine in bed;Call light within reach;Telephone within reach;Patient safety alarm activated   Support Present Post Treatment  Nurse present   Communication Post Treatement Nurse   Communication Post Treatment Comment up with one assist and walker.  patient wants to return home with homehealth   Patient Effort good   Post-Treatment Vital Signs   Post-treatment Heart Rate (beats/min) 84   Post SpO2 (%) 93   O2 Delivery Post Treatment supplemental O2   Post-Treatment Pain   Posttreatment Pain Rating 0/10 - no pain  Physical Therapy Clinical Impression   Assessment 74 y/o female admitted 07-07-24 with acute exacerbation of COPD. PMH: COPD, depression CHF.  PT eval completed with patient able to cover 46ft with FWW and portable 02  before fatigue.  she requires CGA for safety and would benefit from homehealth PT when d/c home with family assist.   Therapy will follow patient while in facility to advance her functional strength for transfers/gait   Criteria for Skilled Therapeutic yes   Impairments Found (describe specific impairments) muscle performance;gait, locomotion, and balance   Functional Limitations in Following  self-care;home management   Rehab Potential good   Therapy Frequency   (1-2xday/ at least one day weekly)   Predicted Duration of Therapy Intervention (days/wks) until discharge   Anticipated Equipment Needs at Discharge (PT) none anticipated   Anticipated Discharge Disposition home with home health   Evaluation Complexity Justification   Patient History: Co-morbidity/factors that impact Plan of Care 3 or more that impact Plan of Care   Examination Components 4 or more Exam elements addressed   Presentation Stable: Uncomplicated, straight-forward, problem focused   Clinical Decision Making Low complexity   Evaluation Complexity Low complexity   Care Plan  Goals   PT Rehab Goals Gait Training Goal   Gait Training  Goal, Distance to Achieve   Gait Training  Goal, Date Established 07/09/24   Gait Training  Goal, Time to Achieve by discharge   Gait Training  Goal, Current Status contact guard assist   Gait Training  Goal, Independence Level supervision required   Gait Training  Goal, Assist Device walker, rolling   Gait Training  Goal, Distance to Achieve 250   Physical Therapy Time and Intention   Total PT Minutes: 15         INTERVENTION MINUTES: EVALUATION 15 minutes    EVALUATION COMPLEXITY : CLINICAL DECISION MAKING OF LOW COMPLEXITY AS INDICATED BY PMH, PHYSICAL THERAPY ASSESSMENT OF MUSCULOSKELETAL AND NEUROLOGICAL SYSTEMS AND ACTIVITY LIMITATIONS. CLINICAL PRESENTATION IS STABLE AND UNCOMPLICATED    Therapist:     Fairy Devonshire, PT  07/09/2024, 16:06

## 2024-07-09 NOTE — Care Plan (Signed)
 Problem: Adult Inpatient Plan of Care  Goal: Absence of Hospital-Acquired Illness or Injury  Outcome: Ongoing (see interventions/notes)  Intervention: Prevent Skin Injury  Recent Flowsheet Documentation  Taken 07/09/2024 0400 by Izetta DEL, RN  Body Position: side lying, right  Taken 07/09/2024 0200 by Izetta DEL, RN  Body Position: supine, head elevated  Taken 07/08/2024 2353 by Izetta DEL, RN  Body Position: side lying, right  Taken 07/08/2024 2300 by Izetta DEL, RN  Skin Protection: adhesive use limited  Taken 07/08/2024 2200 by Izetta DEL, RN  Body Position: side lying, right  Taken 07/08/2024 2000 by Izetta DEL, RN  Body Position: supine, head elevated  Intervention: Prevent and Manage VTE (Venous Thromboembolism) Risk  Recent Flowsheet Documentation  Taken 07/08/2024 2000 by Izetta DEL, RN  Sequential Compression Device (SCDs):   Sequential compression device off   patient refused intervention  VTE Prevention/Management: ambulation promoted  Goal: Optimal Comfort and Wellbeing  Outcome: Ongoing (see interventions/notes)  Goal: Rounds/Family Conference  Outcome: Ongoing (see interventions/notes)     Problem: Skin Injury Risk Increased  Goal: Skin Health and Integrity  Outcome: Ongoing (see interventions/notes)  Intervention: Optimize Skin Protection  Recent Flowsheet Documentation  Taken 07/08/2024 2300 by Izetta DEL, RN  Pressure Reduction Techniques: Frequent weight shifting encouraged  Pressure Reduction Devices: Repositioning wedges/pillows utilized  Skin Protection: adhesive use limited     Problem: COPD (Chronic Obstructive Pulmonary Disease) Exacerbation  Goal: Optimal Coping with Chronic Illness  Outcome: Ongoing (see interventions/notes)  Goal: Optimal Level of Functional Independence  Outcome: Ongoing (see interventions/notes)  Goal: Absence of Infection Signs and Symptoms  Outcome: Ongoing (see interventions/notes)  Goal: Improved Oral Intake  Outcome: Ongoing (see interventions/notes)  Goal: Effective Oxygenation and  Ventilation  Outcome: Ongoing (see interventions/notes)     Problem: Nausea and Vomiting  Goal: Nausea and Vomiting Relief  Outcome: Ongoing (see interventions/notes)   Goal Outcome Evaluation:     Anxieties, Fears or Concerns: none voiced (07/08/24 2000)  Individualized Care Needs: monitor vs and lab (07/08/24 2000)  Patient-Specific Goals (Include Timeframe): home when feeling better (07/08/24 2000)  Plan of Care Reviewed With: (not recorded)     Patient Progress: improving

## 2024-07-09 NOTE — Nurses Notes (Signed)
 Speech unable to get to pt until tomorrow, provider notified. Per Dr. Vincente it is ok to DC home since she has been eating and drinking without any difficulties.

## 2024-07-09 NOTE — Consults (Signed)
 [facility]  Palliative Care Nurse Practitioner  Consult Note    Misty Johnson, Misty Johnson, 74 y.o. female  Date of Birth:  15-Aug-1950  MRN: [id number]  Admit Date: 07/07/2024   Attending: Hospitalist  Ashley Darago, APRN, CNP   Reason for Consult: goal clarification, communication with patient/family, COPD: readmission   Requesting provider: Dr. Toribio    Chief Complaint:  Shortness of breath, diarrhea    HPI:  Misty Johnson is a 74 y.o. female with COPD, CHF who presented to the ED with increased shortness of breath.  Pt reported a recent admission for pneumonia and similar complaints, but didn't feel any better when discharged.  Pt states she also started having diarrhea.  CTA chest negative for pe, showed moderate emphysema and cardiomegaly.  Chest x-ray improved aeration of the left lower lung zone, no acute findings.  Pt admitted with COPD exacerbation, diarrhea.  Pt tx with nebulizers, steroids, mucinex , oxygen , stool for gi biofire and cdiff were ordered but has not been obtained yet.      Subjective:  PT lying in bed.  She states her shortness of breath is improving and verbalized she hasn't had any diarrhea since Saturday. She voices no complaints at this time.  Pt states she may get to go home today.    Review of Systems:  ROS: Other than ROS in the HPI, all other systems were negative.    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     Enlarged heart     LVH (left ventricular hypertrophy)     Major depression          Past Surgical History:   Procedure Laterality Date    NO PAST SURGERIES            Family Medical History:       Problem Relation (Age of Onset)    No Known Problems Mother, Father             Social History     Socioeconomic History    Marital status: Widowed   Tobacco Use    Smoking status: Every Day     Current packs/day: 1.00     Types: Cigarettes    Smokeless tobacco: Never   Vaping Use    Vaping status: Never Used   Substance and Sexual Activity    Alcohol  use: Not Currently    Drug use: Never    Sexual activity: Not Currently     Social Determinants of Health     Social Connections: Low Risk (07/08/2024)    Social Connections     SDOH Social Isolation: 5 or more times a week       Current Outpatient Medications   Medication Instructions    atorvastatin  (LIPITOR) 40 mg, EVERY EVENING    budesonide  (PULMICORT  RESPULES) 1 mg, Nebulization, 2 TIMES DAILY    ergocalciferol (vitamin D2) (DRISDOL) 50,000 Units, EVERY 7 DAYS    escitalopram  oxalate (LEXAPRO ) 20 mg, Daily    fluticasone -umeclidin-vilanter (TRELEGY ELLIPTA) 100-62.5-25 mcg Inhalation Disk with Device 1 Inhalation, Daily    furosemide  (LASIX ) 40 mg, Oral, Daily  guaiFENesin  (MUCINEX ) 600 mg, Oral, 2 TIMES DAILY    ipratropium-albuterol  0.5 mg-3 mg(2.5 mg base)/3 mL Solution for Nebulization 3 mL, Nebulization, 4 TIMES DAILY    Levocetirizine (XYZAL) 5 mg, EVERY EVENING    metoprolol  succinate (TOPROL -XL) 25 mg, Daily    OXYGEN -AIR DELIVERY SYSTEMS DEACT 3L NC at home    pantoprazole  (PROTONIX ) 40 mg, Oral, DAILY    potassium chloride  (K-DUR) 20 mEq Oral Tab Sust.Rel. Particle/Crystal 20 mEq, Daily    Yupelri  175 mcg, DAILY      Allergies   Allergen Reactions    Hydrocodone  Nausea/ Vomiting         Physical Exam:  Constitutional: no distress  Respiratory: decreased breath sounds bilaterally  Cardiovascular: S1, S2 normal  Gastrointestinal: non-distended, Soft, non-tender, Bowel sounds normal  Extremities: no edema  Integumentary:  Skin warm and dry  Neurologic: Alert and oriented x3    BP (!) 116/48   Pulse 92   Temp 36.4 C (97.5 F)   Resp 18   Ht 1.626 m (5' 4.02)   Wt 70.2 kg (154 lb 12.2 oz)   SpO2 95%   BMI 26.55 kg/m       Pain: Numeric 0     Labs:  Lab Results Today:    Results for orders placed or performed during the hospital encounter of 07/07/24 (from the past 24 hours)   CBC   Result Value Ref Range    WBC 8.0 3.8 - 11.8 x103/uL    RBC 3.53 (L) 3.63 - 4.92 x106/uL    HGB 9.6 (L) 10.9 - 14.3 g/dL    HCT 70.2 (L) 68.7 - 41.9 %    MCV 84.1 75.5 - 95.3 fL    MCH 27.3 24.7 - 32.8 pg    MCHC 32.5 32.3 - 35.6 g/dL    RDW 74.5 (H) 87.6 - 17.7 %    PLATELETS 112 (L) 140 - 440 x103/uL    MPV 9.8 7.9 - 10.8 fL   BASIC METABOLIC PANEL, NON-FASTING   Result Value Ref Range    SODIUM 138 136 - 145 mmol/L    POTASSIUM 4.0 3.5 - 5.1 mmol/L    CHLORIDE 102 98 - 107 mmol/L    CO2 TOTAL 31 21 - 31 mmol/L    ANION GAP 5 4 - 13 mmol/L    CALCIUM  8.4 (L) 8.6 - 10.3 mg/dL    GLUCOSE 844 (H) 74 - 109 mg/dL    BUN 18 7 - 25 mg/dL    CREATININE 9.11 9.39 - 1.30 mg/dL    BUN/CREA RATIO 20 6 - 22    ESTIMATED GFR 68 >59 mL/min/1.38m2    OSMOLALITY, CALCULATED 281 270 - 290 mOsm/kg   MAGNESIUM    Result Value Ref Range    MAGNESIUM  2.1 1.9 - 2.7 mg/dL   C-REACTIVE PROTEIN (CRP)   Result Value Ref Range    C-REACTIVE PROTEIN (CRP) <0.5 0.1 - 0.5 mg/dL   SCAN DIFFERENTIAL   Result Value Ref Range    ANISOCYTOSIS 2+/Moderate     SCHISTOCYTES Absent     STOMATOCYTES Moderate/5-20%     PLATELET MORPHOLOGY COMMENT Adequate        Imaging Studies:  CT ANGIO CHEST FOR PULMONARY EMBOLUS W IV CONTRAST  Result Date: 07/07/2024  Impression No evidence of acute pulmonary embolism or other acute findings in the chest. Moderate emphysema. Moderate cardiomegaly. Radiologist location ID: [id number]     CT ABDOMEN PELVIS WO IV CONTRAST  Result Date: 07/07/2024  Impression No acute findings in the abdomen or pelvis by noncontrast CT. Radiologist location ID: [id number]     XR AP MOBILE CHEST  Result Date: 07/07/2024  Impression Improved aeration of the left lower lung zone. No new acute findings. Radiologist location ID: [id number]     Images and Reports reviewed to  current date.    Care collaborated with the following providers:  Case manager    ACP:  Advance directives discussed and pt wishes to complete a MPOA.  MPOA papers completed with assistance of case manager for notarization, she chose daughter Asberry as MPOA and declined to add a second person.  Original and 2 copies provided to patient, copy placed on the chart, copy taken to case management to scan into epic.    Code Status:  Full code.  Code status discussed with pt/daughter.  Both agree that patient is to remain a full code with cpr/intubation.    GOC:  GOC discussed with patient and daughter Asberry.  We reviewed the patient's current condition and plan of care.  Pt has COPD, CHF.  She wears oxygen  @ 3L NC ATC at home.  She states she smokes 1/2 ppd.  Overall effects smoking can have on her health was discussed and smoking cessation advised.  Pt reports that she has to have assistance from her daughter to complete her ADL's as she gets too short of breath.  Education on the disease trajectories of chronic health conditions, signs of progression, and impact on overall health discussed, patient/daughter voiced understanding and had no questions. Patient had been referred to [facility]iative care in August after I saw her as an inpatient.  Discussed with patient if palliative care is seeing her.  Pt states she isn't sure if it was palliative care but she had a girl coming to see her but just stopped a couple weeks ago because she had improved.  Discussed palliative care again with patient and she and her daughter would like a referral at discharge.     PPS prior to hospitalization: 60%    PPS currently: 50%      Persons present and participating in discussion: Patient, daughter Asberry      Was the conversation voluntary? Yes      Plan:  A palliative referral will be made at discharge to a community palliative agency that services the county of patient's residence.    Patient Disposition/Discharge  needs:  Home    ACP/GOC time:     28 minutes    Total visit time:     48 minutes including ACP/GOC time, Face to face, reviewing medical records, and documentation time.    Thank you for this consult.    Darice KATHEE Potters, APRN, CNP  Palliative Care Nurse Practitioner    This note may have been partially generated using Mmodal Fluency Direct system and there may be some incorrect words, spellings, and punctuation that were not noted in checking the note before saving.

## 2024-07-10 NOTE — Care Management Notes (Signed)
 [facility]  Care Management Note    Patient Name: Misty Johnson  Date of Birth: [birth year]  Sex: female  Date/Time of Admission: 07/07/2024  5:21 PM  Room/Bed: 330/B  Payor: HU[employer]NA MEDICARE / Plan: HUMANA MEDICARE / Product Type: MEDICARE [facility] /    LOS: 1 day   Primary Care Providers:  Hamp Knee, APRN, CNP, APRN, C* (General)    Admitting Diagnosis:  Acute exacerbation of chronic obstructive pulmonary disease (COPD) (CMS HCC) [J44.1]    Assessment:       Discharge Plan:  Home with Home Health (code 6)  ROC HH ORDER AND D/C SUMMARY ADDED TO CAREPORT FOR [facility]. DANIELLE NOTIFIED.     The patient will continue to be evaluated for developing discharge needs.     Case Manager: Orie Louder, SOCIAL WORKER  Phone: [phone]

## 2024-07-10 NOTE — Care Management Notes (Signed)
 Referral Information  ++++++ Placed Provider #1 ++++++  Case Manager: Luiz Ochoa  Provider Type: Home Health - Return  Provider Name: [facility]  Address:  [address]  [place], [place] [address]  Contact:  Admissions Administrator  Phone: [phone] x  Fax:   Fax: [phone]

## 2024-07-11 ENCOUNTER — Encounter (INDEPENDENT_AMBULATORY_CARE_PROVIDER_SITE_OTHER): Payer: Self-pay | Admitting: Surgery

## 2024-07-12 ENCOUNTER — Encounter (INDEPENDENT_AMBULATORY_CARE_PROVIDER_SITE_OTHER): Payer: Self-pay | Admitting: Surgery

## 2024-07-13 NOTE — H&P (Deleted)
 GENERAL SURGERY, [facility]  [address]  [place] [address]    History and Physical    Name: Misty Johnson MRN:  [id number]   Date: 07/15/2024 DOB:  May 16, 1951 (74 y.o.)                  Reason for Visit: No chief complaint on file.    History of Present Illness  Ms. Civil presents today***        MEDICAL DECISION:  Review of the result(s) of each unique test:  Patient underwent diagnostic testing ( ***) prior to this dates visit.  I have personally reviewed the results and that serves as a component of the medical decision making for this encounter       Review of prior external note(s) from each unique source:  Patients referral to this office including a recent assessment by the referring provider.  This was reviewed by me for this unique office visit for the indication and intent of the referral as well as any pertinent medical or surgical history relevant to the patients independent evaluation by me today.        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     Enlarged heart     Esophageal reflux     LVH (left ventricular hypertrophy)     Major depression     Myocardial infarction          Past Surgical History:   Procedure Laterality Date    NO PAST SURGERIES           Current Outpatient Medications   Medication Sig    atorvastatin  (LIPITOR) 40 mg Oral Tablet Take 1 Tablet (40 mg total) by mouth Every evening    budesonide  (PULMICORT  RESPULES) 0.5 mg/2 mL Inhalation nebulizer suspension Take 4 mL (1 mg total) by nebulization Twice daily for 14 days Indications: worsening of debilitating chronic lung disease called COPD, J44.1     ergocalciferol, vitamin D2, (DRISDOL) 1,250 mcg (50,000 unit) Oral Capsule Take 1 Capsule (50,000 Units total) by mouth Every 7 days Saturday    escitalopram  oxalate (LEXAPRO ) 20 mg Oral Tablet Take 1 Tablet (20 mg total) by mouth Daily    fluticasone -umeclidin-vilanter (TRELEGY ELLIPTA) 100-62.5-25 mcg Inhalation Disk with Device Take 1 Inhalation by inhalation Daily    furosemide  (LASIX ) 40 mg Oral Tablet Take 1 Tablet (40 mg total) by mouth Daily for 90 days    guaiFENesin  (MUCINEX ) 600 mg Oral Tablet Extended Release 12hr Take 1 Tablet (600 mg total) by mouth Twice daily for 14 days    ipratropium-albuterol  0.5 mg-3 mg(2.5 mg base)/3 mL Solution for Nebulization Take 3 mL by nebulization Four times a day Indications: bronchi muscle spasm resulting from COPD, J44.1    Levocetirizine (XYZAL) 5 mg Oral Tablet Take 1 Tablet (5 mg total) by mouth Every evening    metoprolol  succinate (TOPROL -XL) 25 mg Oral Tablet Sustained Release 24 hr Take 1 Tablet (25 mg total) by mouth Daily    OXYGEN -AIR DELIVERY SYSTEMS DEACT 3L NC at home  pantoprazole  (PROTONIX ) 40 mg Oral Tablet, Delayed Release (E.C.) Take 1 Tablet (40 mg total) by mouth Once a day for 90 days    potassium chloride  (K-DUR) 20 mEq Oral Tab Sust.Rel. Particle/Crystal Take 1 Tablet (20 mEq total) by mouth Daily    predniSONE  (DELTASONE ) 20 mg Oral Tablet Take 2 Tablets (40 mg total) by mouth Daily for 5 days    revefenacin  (YUPELRI ) 175 mcg/3 mL Inhalation nebulizer solution Take 3 mL (175 mcg total) by nebulization Daily     Allergies[1]  Family Medical History:       Problem Relation (Age of Onset)    No Known Problems Mother, Father            Social History[2]         Physical Examination:  There were no vitals filed for this visit.   General: appropriate for age. in no acute distress.    Vital signs are present above and have been reviewed by me     HEENT: Atraumatic, Normocephalic.    Lungs: Nonlabored breathing with symmetric  expansion    Heart:Regular wth respect to rate.    Abdomen:Soft. Nontender. Nondistended     Psychiatric: Alert and oriented to person, place, and time. affect appropriate      Assessment and Plan  No diagnosis found.      ***          I appreciate the opportunity to be involved in the care of your patients.  If you have any questions or concerns regarding this encounter, please do not hesitate to contact me at your convenience.      Alm DELENA Nam MD MBA CPE FACS     This note may have been partially generated using MModal Fluency Direct system, and there may be some incorrect words, spellings, and punctuation that were not noted in checking the note before saving, though effort was made to avoid such errors.                 [1]   Allergies  Allergen Reactions    Hydrocodone  Nausea/ Vomiting   [2]   Social History  Tobacco Use    Smoking status: Every Day     Current packs/day: 1.00     Types: Cigarettes    Smokeless tobacco: Never   Vaping Use    Vaping status: Never Used   Substance Use Topics    Alcohol  use: Not Currently    Drug use: Never

## 2024-07-15 ENCOUNTER — Encounter (INDEPENDENT_AMBULATORY_CARE_PROVIDER_SITE_OTHER): Payer: Self-pay

## 2024-07-15 ENCOUNTER — Ambulatory Visit (INDEPENDENT_AMBULATORY_CARE_PROVIDER_SITE_OTHER): Payer: Self-pay | Admitting: Surgery

## 2024-07-16 NOTE — H&P (Deleted)
 GENERAL SURGERY, [facility]  [address]  [place] [address]    History and Physical    Name: Misty Johnson MRN:  [id number]   Date: 07/17/2024 DOB:  1951-03-06 (74 y.o.)                  Reason for Visit: No chief complaint on file.    History of Present Illness  Ms. Misty Johnson presents today***    Patient underwent an EGD last month showing moderate distal esophagitis, small hiatal hernia and mild gastritis            MEDICAL DECISION:  Review of the result(s) of each unique test:  Patient underwent diagnostic testing ( ***) prior to this dates visit.  I have personally reviewed the results and that serves as a component of the medical decision making for this encounter       Review of prior external note(s) from each unique source:  Patients referral to this office including a recent assessment by the referring provider.  This was reviewed by me for this unique office visit for the indication and intent of the referral as well as any pertinent medical or surgical history relevant to the patients independent evaluation by me today.        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     Enlarged heart     Esophageal reflux     LVH (left ventricular hypertrophy)     Major depression     Myocardial infarction          Past Surgical History:   Procedure Laterality Date    NO PAST SURGERIES           Current Outpatient Medications   Medication Sig    atorvastatin  (LIPITOR) 40 mg Oral Tablet Take 1 Tablet (40 mg total) by mouth Every evening    ergocalciferol, vitamin D2, (DRISDOL) 1,250 mcg (50,000 unit) Oral Capsule Take 1 Capsule (50,000 Units  total) by mouth Every 7 days Saturday    escitalopram  oxalate (LEXAPRO ) 20 mg Oral Tablet Take 1 Tablet (20 mg total) by mouth Daily    fluticasone -umeclidin-vilanter (TRELEGY ELLIPTA) 100-62.5-25 mcg Inhalation Disk with Device Take 1 Inhalation by inhalation Daily    furosemide  (LASIX ) 40 mg Oral Tablet Take 1 Tablet (40 mg total) by mouth Daily for 90 days    ipratropium-albuterol  0.5 mg-3 mg(2.5 mg base)/3 mL Solution for Nebulization Take 3 mL by nebulization Four times a day Indications: bronchi muscle spasm resulting from COPD, J44.1    Levocetirizine (XYZAL) 5 mg Oral Tablet Take 1 Tablet (5 mg total) by mouth Every evening    metoprolol  succinate (TOPROL -XL) 25 mg Oral Tablet Sustained Release 24 hr Take 1 Tablet (25 mg total) by mouth Daily    OXYGEN -AIR DELIVERY SYSTEMS DEACT 3L NC at home    pantoprazole  (PROTONIX ) 40 mg Oral Tablet, Delayed Release (E.C.) Take 1 Tablet (40 mg total) by mouth Once a day for 90 days    potassium chloride  (K-DUR) 20 mEq Oral Tab Sust.Rel.  Particle/Crystal Take 1 Tablet (20 mEq total) by mouth Daily    revefenacin  (YUPELRI ) 175 mcg/3 mL Inhalation nebulizer solution Take 3 mL (175 mcg total) by nebulization Daily     Allergies[1]  Family Medical History:       Problem Relation (Age of Onset)    No Known Problems Mother, Father            Social History[2]         Physical Examination:  There were no vitals filed for this visit.   General: appropriate for age. in no acute distress.    Vital signs are present above and have been reviewed by me     HEENT: Atraumatic, Normocephalic.    Lungs: Nonlabored breathing with symmetric expansion    Heart:Regular wth respect to rate.    Abdomen:Soft. Nontender. Nondistended     Psychiatric: Alert and oriented to person, place, and time. affect appropriate      Assessment and Plan  No diagnosis found.      ***          I appreciate the opportunity to be involved in the care of your patients.  If you have any questions or concerns  regarding this encounter, please do not hesitate to contact me at your convenience.      Alm DELENA Nam MD MBA CPE FACS     This note may have been partially generated using MModal Fluency Direct system, and there may be some incorrect words, spellings, and punctuation that were not noted in checking the note before saving, though effort was made to avoid such errors.                 [1]   Allergies  Allergen Reactions    Hydrocodone  Nausea/ Vomiting   [2]   Social History  Tobacco Use    Smoking status: Every Day     Current packs/day: 1.00     Types: Cigarettes    Smokeless tobacco: Never   Vaping Use    Vaping status: Never Used   Substance Use Topics    Alcohol  use: Not Currently    Drug use: Never

## 2024-07-17 ENCOUNTER — Ambulatory Visit (INDEPENDENT_AMBULATORY_CARE_PROVIDER_SITE_OTHER): Payer: Self-pay | Admitting: Surgery

## 2024-07-20 NOTE — H&P (Deleted)
 GENERAL SURGERY, [facility]  [address]  [place] [address]    History and Physical    Name: Misty Johnson MRN:  [id number]   Date: 07/22/2024 DOB:  Jun 27, 1951 (74 y.o.)                  Reason for Visit: No chief complaint on file.    History of Present Illness  Misty Johnson presents today***    Patient most recent hemoglobin was 9.6        MEDICAL DECISION:  Review of the result(s) of each unique test:  Patient underwent diagnostic testing ( laboratory study) prior to this dates visit.  I have personally reviewed the results and that serves as a component of the medical decision making for this encounter       Review of prior external note(s) from each unique source:  Patients referral to this office including a recent assessment by the referring provider.  This was reviewed by me for this unique office visit for the indication and intent of the referral as well as any pertinent medical or surgical history relevant to the patients independent evaluation by me today.        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     Enlarged heart     Esophageal reflux     LVH (left ventricular hypertrophy)     Major depression     Myocardial infarction          Past Surgical History:   Procedure Laterality Date    NO PAST SURGERIES           Current Outpatient Medications   Medication Sig    atorvastatin  (LIPITOR) 40 mg Oral Tablet Take 1 Tablet (40 mg total) by mouth Every evening    ergocalciferol, vitamin D2, (DRISDOL) 1,250 mcg (50,000 unit) Oral Capsule Take 1 Capsule (50,000 Units total) by mouth Every 7 days Saturday    escitalopram  oxalate  (LEXAPRO ) 20 mg Oral Tablet Take 1 Tablet (20 mg total) by mouth Daily    fluticasone -umeclidin-vilanter (TRELEGY ELLIPTA) 100-62.5-25 mcg Inhalation Disk with Device Take 1 Inhalation by inhalation Daily    furosemide  (LASIX ) 40 mg Oral Tablet Take 1 Tablet (40 mg total) by mouth Daily for 90 days    ipratropium-albuterol  0.5 mg-3 mg(2.5 mg base)/3 mL Solution for Nebulization Take 3 mL by nebulization Four times a day Indications: bronchi muscle spasm resulting from COPD, J44.1    Levocetirizine (XYZAL) 5 mg Oral Tablet Take 1 Tablet (5 mg total) by mouth Every evening    metoprolol  succinate (TOPROL -XL) 25 mg Oral Tablet Sustained Release 24 hr Take 1 Tablet (25 mg total) by mouth Daily    OXYGEN -AIR DELIVERY SYSTEMS DEACT 3L NC at home    pantoprazole  (PROTONIX ) 40 mg Oral Tablet, Delayed Release (E.C.) Take 1 Tablet (40 mg total) by mouth Once a day for 90 days    potassium chloride  (K-DUR) 20 mEq Oral Tab Sust.Rel. Particle/Crystal Take 1 Tablet (20 mEq total) by mouth Daily  revefenacin  (YUPELRI ) 175 mcg/3 mL Inhalation nebulizer solution Take 3 mL (175 mcg total) by nebulization Daily     Allergies[1]  Family Medical History:       Problem Relation (Age of Onset)    No Known Problems Mother, Father            Social History[2]         Physical Examination:  There were no vitals filed for this visit.   General: appropriate for age. in no acute distress.    Vital signs are present above and have been reviewed by me     HEENT: Atraumatic, Normocephalic.    Lungs: Nonlabored breathing with symmetric expansion    Heart:Regular wth respect to rate.    Abdomen:Soft. Nontender. Nondistended     Psychiatric: Alert and oriented to person, place, and time. affect appropriate      Assessment and Plan  No diagnosis found.      ***          I appreciate the opportunity to be involved in the care of your patients.  If you have any questions or concerns regarding this encounter, please do not hesitate to contact me at  your convenience.      Alm DELENA Nam MD MBA CPE FACS     This note may have been partially generated using MModal Fluency Direct system, and there may be some incorrect words, spellings, and punctuation that were not noted in checking the note before saving, though effort was made to avoid such errors.                 [1]   Allergies  Allergen Reactions    Hydrocodone  Nausea/ Vomiting   [2]   Social History  Tobacco Use    Smoking status: Every Day     Current packs/day: 1.00     Types: Cigarettes    Smokeless tobacco: Never   Vaping Use    Vaping status: Never Used   Substance Use Topics    Alcohol  use: Not Currently    Drug use: Never

## 2024-07-22 ENCOUNTER — Ambulatory Visit (INDEPENDENT_AMBULATORY_CARE_PROVIDER_SITE_OTHER): Payer: Self-pay | Admitting: Surgery

## 2024-08-12 ENCOUNTER — Ambulatory Visit (HOSPITAL_COMMUNITY): Payer: Self-pay | Admitting: SLEEP MEDICINE
